# Patient Record
Sex: Male | Born: 1950 | Race: White | Hispanic: No | Smoking: Former smoker
Health system: Southern US, Community
[De-identification: ages and names within clinical notes are randomized; demographics above are authoritative.]

## PROBLEM LIST (undated history)

## (undated) DIAGNOSIS — F32A Depression, unspecified: Secondary | ICD-10-CM

## (undated) DIAGNOSIS — K922 Gastrointestinal hemorrhage, unspecified: Secondary | ICD-10-CM

## (undated) DIAGNOSIS — K219 Gastro-esophageal reflux disease without esophagitis: Secondary | ICD-10-CM

## (undated) DIAGNOSIS — K449 Diaphragmatic hernia without obstruction or gangrene: Secondary | ICD-10-CM

## (undated) DIAGNOSIS — F419 Anxiety disorder, unspecified: Secondary | ICD-10-CM

## (undated) DIAGNOSIS — F1910 Other psychoactive substance abuse, uncomplicated: Secondary | ICD-10-CM

## (undated) DIAGNOSIS — I8500 Esophageal varices without bleeding: Secondary | ICD-10-CM

## (undated) DIAGNOSIS — R519 Headache, unspecified: Secondary | ICD-10-CM

## (undated) DIAGNOSIS — K769 Liver disease, unspecified: Secondary | ICD-10-CM

## (undated) DIAGNOSIS — R51 Headache: Secondary | ICD-10-CM

## (undated) DIAGNOSIS — N189 Chronic kidney disease, unspecified: Secondary | ICD-10-CM

## (undated) DIAGNOSIS — I639 Cerebral infarction, unspecified: Secondary | ICD-10-CM

## (undated) DIAGNOSIS — G8929 Other chronic pain: Secondary | ICD-10-CM

## (undated) DIAGNOSIS — E785 Hyperlipidemia, unspecified: Secondary | ICD-10-CM

## (undated) DIAGNOSIS — F329 Major depressive disorder, single episode, unspecified: Secondary | ICD-10-CM

## (undated) DIAGNOSIS — Z87442 Personal history of urinary calculi: Secondary | ICD-10-CM

## (undated) DIAGNOSIS — M1990 Unspecified osteoarthritis, unspecified site: Secondary | ICD-10-CM

## (undated) DIAGNOSIS — I10 Essential (primary) hypertension: Secondary | ICD-10-CM

## (undated) DIAGNOSIS — L409 Psoriasis, unspecified: Secondary | ICD-10-CM

## (undated) DIAGNOSIS — T7840XA Allergy, unspecified, initial encounter: Secondary | ICD-10-CM

## (undated) DIAGNOSIS — D649 Anemia, unspecified: Secondary | ICD-10-CM

## (undated) DIAGNOSIS — J45909 Unspecified asthma, uncomplicated: Secondary | ICD-10-CM

## (undated) DIAGNOSIS — K635 Polyp of colon: Secondary | ICD-10-CM

## (undated) HISTORY — DX: Other chronic pain: G89.29

## (undated) HISTORY — DX: Headache: R51

## (undated) HISTORY — DX: Gastro-esophageal reflux disease without esophagitis: K21.9

## (undated) HISTORY — DX: Essential (primary) hypertension: I10

## (undated) HISTORY — DX: Unspecified asthma, uncomplicated: J45.909

## (undated) HISTORY — DX: Polyp of colon: K63.5

## (undated) HISTORY — DX: Major depressive disorder, single episode, unspecified: F32.9

## (undated) HISTORY — DX: Esophageal varices without bleeding: I85.00

## (undated) HISTORY — DX: Unspecified osteoarthritis, unspecified site: M19.90

## (undated) HISTORY — DX: Anemia, unspecified: D64.9

## (undated) HISTORY — DX: Diaphragmatic hernia without obstruction or gangrene: K44.9

## (undated) HISTORY — PX: TONSILLECTOMY AND ADENOIDECTOMY: SHX28

## (undated) HISTORY — DX: Depression, unspecified: F32.A

## (undated) HISTORY — DX: Anxiety disorder, unspecified: F41.9

## (undated) HISTORY — PX: VASECTOMY: SHX75

## (undated) HISTORY — PX: COLONOSCOPY: SHX174

## (undated) HISTORY — DX: Headache, unspecified: R51.9

## (undated) HISTORY — DX: Cerebral infarction, unspecified: I63.9

## (undated) HISTORY — DX: Gastrointestinal hemorrhage, unspecified: K92.2

## (undated) HISTORY — DX: Hyperlipidemia, unspecified: E78.5

## (undated) HISTORY — DX: Liver disease, unspecified: K76.9

## (undated) HISTORY — DX: Other psychoactive substance abuse, uncomplicated: F19.10

## (undated) HISTORY — DX: Allergy, unspecified, initial encounter: T78.40XA

---
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
## 1998-08-02 ENCOUNTER — Encounter (INDEPENDENT_AMBULATORY_CARE_PROVIDER_SITE_OTHER): Payer: Self-pay | Admitting: Internal Medicine

## 1998-08-02 LAB — CONVERTED CEMR LAB
RBC count: 4.76 10*6/uL
WBC, blood: 4.7 10*3/uL

## 1999-07-28 ENCOUNTER — Encounter (INDEPENDENT_AMBULATORY_CARE_PROVIDER_SITE_OTHER): Payer: Self-pay | Admitting: Internal Medicine

## 1999-07-28 LAB — CONVERTED CEMR LAB: PSA: 1.4 ng/mL

## 1999-08-14 ENCOUNTER — Encounter (INDEPENDENT_AMBULATORY_CARE_PROVIDER_SITE_OTHER): Payer: Self-pay | Admitting: Internal Medicine

## 1999-08-15 ENCOUNTER — Encounter (INDEPENDENT_AMBULATORY_CARE_PROVIDER_SITE_OTHER): Payer: Self-pay | Admitting: Internal Medicine

## 1999-08-15 LAB — CONVERTED CEMR LAB: RBC count: 4.59 10*6/uL

## 2000-11-27 ENCOUNTER — Encounter (INDEPENDENT_AMBULATORY_CARE_PROVIDER_SITE_OTHER): Payer: Self-pay | Admitting: Internal Medicine

## 2000-12-15 ENCOUNTER — Encounter (INDEPENDENT_AMBULATORY_CARE_PROVIDER_SITE_OTHER): Payer: Self-pay | Admitting: Internal Medicine

## 2000-12-15 LAB — CONVERTED CEMR LAB
PSA: 2.5 ng/mL
TSH: 0.91 microintl units/mL

## 2002-06-28 ENCOUNTER — Encounter (INDEPENDENT_AMBULATORY_CARE_PROVIDER_SITE_OTHER): Payer: Self-pay | Admitting: Internal Medicine

## 2002-06-28 DIAGNOSIS — E785 Hyperlipidemia, unspecified: Secondary | ICD-10-CM

## 2002-06-28 HISTORY — DX: Hyperlipidemia, unspecified: E78.5

## 2002-07-20 ENCOUNTER — Encounter (INDEPENDENT_AMBULATORY_CARE_PROVIDER_SITE_OTHER): Payer: Self-pay | Admitting: Internal Medicine

## 2002-07-20 LAB — CONVERTED CEMR LAB: TSH: 1.8666 microintl units/mL

## 2002-07-21 ENCOUNTER — Encounter (INDEPENDENT_AMBULATORY_CARE_PROVIDER_SITE_OTHER): Payer: Self-pay | Admitting: Internal Medicine

## 2002-07-21 LAB — CONVERTED CEMR LAB: PSA: 1.6 ng/mL

## 2003-01-28 HISTORY — PX: CARPAL TUNNEL RELEASE: SHX101

## 2003-10-12 ENCOUNTER — Encounter (INDEPENDENT_AMBULATORY_CARE_PROVIDER_SITE_OTHER): Payer: Self-pay | Admitting: Internal Medicine

## 2003-10-12 LAB — CONVERTED CEMR LAB: TSH: 2 microintl units/mL

## 2004-02-28 ENCOUNTER — Encounter (INDEPENDENT_AMBULATORY_CARE_PROVIDER_SITE_OTHER): Payer: Self-pay | Admitting: Internal Medicine

## 2004-02-28 ENCOUNTER — Ambulatory Visit: Payer: Self-pay | Admitting: Family Medicine

## 2004-02-28 LAB — CONVERTED CEMR LAB
PSA: 0.64 ng/mL
PSA: 0.64 ng/mL

## 2004-03-13 ENCOUNTER — Ambulatory Visit: Payer: Self-pay | Admitting: Family Medicine

## 2004-03-19 ENCOUNTER — Encounter (INDEPENDENT_AMBULATORY_CARE_PROVIDER_SITE_OTHER): Payer: Self-pay | Admitting: Internal Medicine

## 2005-03-26 ENCOUNTER — Encounter (INDEPENDENT_AMBULATORY_CARE_PROVIDER_SITE_OTHER): Payer: Self-pay | Admitting: Internal Medicine

## 2005-03-26 ENCOUNTER — Ambulatory Visit: Payer: Self-pay | Admitting: Family Medicine

## 2005-03-26 LAB — CONVERTED CEMR LAB: Blood Glucose, Fasting: 91 mg/dL

## 2005-04-17 ENCOUNTER — Ambulatory Visit: Payer: Self-pay | Admitting: Family Medicine

## 2005-04-18 LAB — FECAL OCCULT BLOOD, GUAIAC: Fecal Occult Blood: NEGATIVE

## 2005-04-24 ENCOUNTER — Ambulatory Visit: Payer: Self-pay | Admitting: Family Medicine

## 2006-04-28 ENCOUNTER — Encounter (INDEPENDENT_AMBULATORY_CARE_PROVIDER_SITE_OTHER): Payer: Self-pay | Admitting: Internal Medicine

## 2006-04-28 LAB — CONVERTED CEMR LAB: PSA: 0.7 ng/mL

## 2006-05-20 ENCOUNTER — Ambulatory Visit: Payer: Self-pay | Admitting: Family Medicine

## 2006-05-20 ENCOUNTER — Encounter (INDEPENDENT_AMBULATORY_CARE_PROVIDER_SITE_OTHER): Payer: Self-pay | Admitting: Internal Medicine

## 2006-06-04 ENCOUNTER — Ambulatory Visit: Payer: Self-pay | Admitting: Internal Medicine

## 2006-06-04 DIAGNOSIS — R945 Abnormal results of liver function studies: Secondary | ICD-10-CM | POA: Insufficient documentation

## 2006-08-05 ENCOUNTER — Ambulatory Visit: Payer: Self-pay | Admitting: Gastroenterology

## 2006-08-19 ENCOUNTER — Encounter (INDEPENDENT_AMBULATORY_CARE_PROVIDER_SITE_OTHER): Payer: Self-pay | Admitting: Internal Medicine

## 2006-08-19 ENCOUNTER — Encounter: Payer: Self-pay | Admitting: Family Medicine

## 2006-08-19 ENCOUNTER — Encounter: Payer: Self-pay | Admitting: Gastroenterology

## 2006-08-19 ENCOUNTER — Ambulatory Visit: Payer: Self-pay | Admitting: Gastroenterology

## 2006-08-19 DIAGNOSIS — K635 Polyp of colon: Secondary | ICD-10-CM

## 2006-08-19 HISTORY — DX: Polyp of colon: K63.5

## 2006-08-26 ENCOUNTER — Encounter (INDEPENDENT_AMBULATORY_CARE_PROVIDER_SITE_OTHER): Payer: Self-pay | Admitting: Internal Medicine

## 2006-10-21 ENCOUNTER — Ambulatory Visit: Payer: Self-pay | Admitting: Family Medicine

## 2006-10-21 DIAGNOSIS — M25519 Pain in unspecified shoulder: Secondary | ICD-10-CM | POA: Insufficient documentation

## 2006-10-23 ENCOUNTER — Encounter: Payer: Self-pay | Admitting: Internal Medicine

## 2006-10-23 DIAGNOSIS — F528 Other sexual dysfunction not due to a substance or known physiological condition: Secondary | ICD-10-CM | POA: Insufficient documentation

## 2006-10-23 DIAGNOSIS — D126 Benign neoplasm of colon, unspecified: Secondary | ICD-10-CM | POA: Insufficient documentation

## 2006-10-23 DIAGNOSIS — Z8669 Personal history of other diseases of the nervous system and sense organs: Secondary | ICD-10-CM | POA: Insufficient documentation

## 2006-10-23 DIAGNOSIS — Z87898 Personal history of other specified conditions: Secondary | ICD-10-CM | POA: Insufficient documentation

## 2006-10-23 DIAGNOSIS — M129 Arthropathy, unspecified: Secondary | ICD-10-CM | POA: Insufficient documentation

## 2006-10-23 DIAGNOSIS — E785 Hyperlipidemia, unspecified: Secondary | ICD-10-CM | POA: Insufficient documentation

## 2006-10-23 DIAGNOSIS — K279 Peptic ulcer, site unspecified, unspecified as acute or chronic, without hemorrhage or perforation: Secondary | ICD-10-CM | POA: Insufficient documentation

## 2007-02-10 ENCOUNTER — Ambulatory Visit: Payer: Self-pay | Admitting: Family Medicine

## 2007-02-10 ENCOUNTER — Encounter (INDEPENDENT_AMBULATORY_CARE_PROVIDER_SITE_OTHER): Payer: Self-pay | Admitting: Internal Medicine

## 2007-03-15 ENCOUNTER — Telehealth (INDEPENDENT_AMBULATORY_CARE_PROVIDER_SITE_OTHER): Payer: Self-pay | Admitting: Internal Medicine

## 2007-07-12 ENCOUNTER — Telehealth (INDEPENDENT_AMBULATORY_CARE_PROVIDER_SITE_OTHER): Payer: Self-pay | Admitting: Internal Medicine

## 2007-09-01 ENCOUNTER — Encounter (INDEPENDENT_AMBULATORY_CARE_PROVIDER_SITE_OTHER): Payer: Self-pay | Admitting: Internal Medicine

## 2007-09-01 ENCOUNTER — Ambulatory Visit: Payer: Self-pay | Admitting: Family Medicine

## 2007-09-01 DIAGNOSIS — R0989 Other specified symptoms and signs involving the circulatory and respiratory systems: Secondary | ICD-10-CM | POA: Insufficient documentation

## 2007-09-01 DIAGNOSIS — I10 Essential (primary) hypertension: Secondary | ICD-10-CM | POA: Insufficient documentation

## 2007-09-01 DIAGNOSIS — R0609 Other forms of dyspnea: Secondary | ICD-10-CM

## 2007-11-08 ENCOUNTER — Telehealth (INDEPENDENT_AMBULATORY_CARE_PROVIDER_SITE_OTHER): Payer: Self-pay | Admitting: Internal Medicine

## 2007-11-10 ENCOUNTER — Telehealth (INDEPENDENT_AMBULATORY_CARE_PROVIDER_SITE_OTHER): Payer: Self-pay | Admitting: Internal Medicine

## 2007-12-16 ENCOUNTER — Encounter (INDEPENDENT_AMBULATORY_CARE_PROVIDER_SITE_OTHER): Payer: Self-pay | Admitting: Internal Medicine

## 2008-01-25 ENCOUNTER — Ambulatory Visit: Payer: Self-pay | Admitting: Family Medicine

## 2008-04-18 ENCOUNTER — Ambulatory Visit: Payer: Self-pay | Admitting: Family Medicine

## 2008-11-28 ENCOUNTER — Ambulatory Visit: Payer: Self-pay | Admitting: Family Medicine

## 2008-11-28 DIAGNOSIS — S335XXA Sprain of ligaments of lumbar spine, initial encounter: Secondary | ICD-10-CM | POA: Insufficient documentation

## 2008-11-29 ENCOUNTER — Encounter (INDEPENDENT_AMBULATORY_CARE_PROVIDER_SITE_OTHER): Payer: Self-pay | Admitting: Internal Medicine

## 2008-11-29 DIAGNOSIS — IMO0002 Reserved for concepts with insufficient information to code with codable children: Secondary | ICD-10-CM | POA: Insufficient documentation

## 2008-11-30 ENCOUNTER — Encounter: Payer: Self-pay | Admitting: Family Medicine

## 2008-12-05 ENCOUNTER — Encounter (INDEPENDENT_AMBULATORY_CARE_PROVIDER_SITE_OTHER): Payer: Self-pay | Admitting: Internal Medicine

## 2008-12-06 ENCOUNTER — Encounter (INDEPENDENT_AMBULATORY_CARE_PROVIDER_SITE_OTHER): Payer: Self-pay | Admitting: Internal Medicine

## 2008-12-07 ENCOUNTER — Telehealth (INDEPENDENT_AMBULATORY_CARE_PROVIDER_SITE_OTHER): Payer: Self-pay | Admitting: Internal Medicine

## 2008-12-14 ENCOUNTER — Telehealth (INDEPENDENT_AMBULATORY_CARE_PROVIDER_SITE_OTHER): Payer: Self-pay | Admitting: Internal Medicine

## 2008-12-14 ENCOUNTER — Encounter (INDEPENDENT_AMBULATORY_CARE_PROVIDER_SITE_OTHER): Payer: Self-pay | Admitting: Internal Medicine

## 2008-12-25 ENCOUNTER — Telehealth (INDEPENDENT_AMBULATORY_CARE_PROVIDER_SITE_OTHER): Payer: Self-pay | Admitting: Internal Medicine

## 2008-12-27 ENCOUNTER — Encounter: Payer: Self-pay | Admitting: Family Medicine

## 2009-01-31 ENCOUNTER — Telehealth (INDEPENDENT_AMBULATORY_CARE_PROVIDER_SITE_OTHER): Payer: Self-pay | Admitting: Internal Medicine

## 2009-02-07 ENCOUNTER — Telehealth: Payer: Self-pay | Admitting: Family Medicine

## 2009-05-14 ENCOUNTER — Ambulatory Visit: Payer: Self-pay | Admitting: Family Medicine

## 2009-05-15 ENCOUNTER — Ambulatory Visit: Payer: Self-pay | Admitting: Family Medicine

## 2009-05-15 DIAGNOSIS — R252 Cramp and spasm: Secondary | ICD-10-CM | POA: Insufficient documentation

## 2009-05-15 DIAGNOSIS — R002 Palpitations: Secondary | ICD-10-CM | POA: Insufficient documentation

## 2009-05-23 ENCOUNTER — Ambulatory Visit: Payer: Self-pay | Admitting: Family Medicine

## 2009-05-23 DIAGNOSIS — R748 Abnormal levels of other serum enzymes: Secondary | ICD-10-CM | POA: Insufficient documentation

## 2009-11-15 ENCOUNTER — Ambulatory Visit: Payer: Self-pay | Admitting: Family Medicine

## 2009-11-15 ENCOUNTER — Encounter: Payer: Self-pay | Admitting: Family Medicine

## 2009-11-15 DIAGNOSIS — K921 Melena: Secondary | ICD-10-CM | POA: Insufficient documentation

## 2009-12-04 ENCOUNTER — Telehealth: Payer: Self-pay | Admitting: Family Medicine

## 2010-02-26 NOTE — Assessment & Plan Note (Signed)
Summary: blood in stool/alc   Vital Signs:  Patient profile:   60 year old male Height:      65 inches Weight:      192.50 pounds BMI:     32.15 Temp:     98.7 degrees F oral Pulse rate:   84 / minute Pulse rhythm:   regular BP sitting:   110 / 56  (left arm) Cuff size:   large  Vitals Entered By: Delilah Shan CMA Duncan Dull) (November 15, 2009 3:40 PM) CC: Blood in stool   History of Present Illness: H/o regular BMs.  Goes to [facility] yesterday and noticed dark blood in stool.  None before that.  Later in the day, patient felt urgency and had bloody diarrhea (still dark).   Repeat colonoscopy last done 2008.    25 years ago patient had hemorrhoids and ulcers.   On meloxicam and PPI.  No otc BC powders/goody's.  Not on ASA.  Not dizzy on standing.  No hearburn, no abdominal pain.  Intentionally losing weight with diet.    3 normal BMs in meantime. No other c/o.   Allergies: 1)  ! Sulfa 2)  ! 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  Otherwise negative.    Physical Exam  General:  NAD abdominal exam beign, s, nt, nd, normal bs rectal faintly heme positive.  no gross blood.  Old ext hemorroid noted, small int hemorroids on exam w/o active  bleeding.    Impression & Recommendations:  Problem # 1:  BLOOD IN STOOL (ICD-578.1)  Prev olonoscopy with tubular adenomas.  Int hemorroids on exam today.  If cbc wnl, and no more symptoms, then will not refer to to GI.  He was faintly heme pos today as expected. See notes on labs.   Orders: Specimen Handling (16109) TLB-CBC Platelet - w/Differential (85025-CBCD)  Complete Medication List: 1)  Lisinopril 20 Mg Tabs (Lisinopril) .... Take 1 tablet by mouth once a day 2)  Sertraline Hcl 50 Mg Tabs (Sertraline hcl) .... Take 1 tablet by mouth once a day 3)  Meloxicam 7.5 Mg Tabs (Meloxicam) .Marland Kitchen.. 1-2 once daily by mouth as needed 4)  Glucosamine-chondroitin Caps (Glucosamine-chondroit-vit c-mn) .Marland Kitchen.. 1 two times a day 5)  Daily Multiple Vitamins Tabs (Multiple vitamin) .Marland Kitchen.. 1  a day 6)  Cialis 20 Mg Tabs (Tadalafil) .... Use no more often than every 36 hours 7)  Potassium 99 Mg Tabs (Potassium) .... Take 1 tablet by mouth two times a day 8)  Prevacid 30 Mg Cpdr (Lansoprazole) .... Take one each morning with water, eat or drink 30-60 minutes later  Patient Instructions: 1)  I'll look at your  labs and send word about going to the GI clinic.  Let me know if you have any more symptoms.  Take care.    Orders Added: 1)  Est. Patient Level III [14782] 2)  Specimen Handling [99000] 3)  TLB-CBC Platelet - w/Differential [85025-CBCD]    Current Allergies (reviewed today): ! SULFA ! * BEE STINGS

## 2010-02-26 NOTE — Progress Notes (Signed)
Summary: Sertraline HCK 50mg  and Meloxicam 7.5mg  refills  Phone Note Refill Request Call back at [phone] Message from:  [facility] [address] on January 31, 2009 4:37 PM  Refills Requested: Medication #1:  SERTRALINE HCL 50 MG TABS Take 1 tablet by mouth once a day  Medication #2:  MELOXICAM 7.5 MG TABS 1-2 once daily [facility] [address] request refills for Sertraline 50mg  and Meloxicam 7.5mg .Please advise.    Method Requested: Telephone to Pharmacy Initial call taken by: Lewanda Rife LPN,  January 31, 2009 4:38 PM  Follow-up for Phone Call        will refill x 3 only--needs office visit in 03/2009--last seen 03/2008   Billie-Lynn Tyler Deis FNP  January 31, 2009 5:14 PM   Unable to reach pt by phone. Pharmacist said would add note to rx to call office for appt in March 2011.Lewanda Rife LPN  February 01, 2009 11:24 AM     Prescriptions: MELOXICAM 7.5 MG TABS (MELOXICAM) 1-2 once daily  #180 x 0   Entered and Authorized by:   Gildardo Griffes FNP   Signed by:   Gildardo Griffes FNP on 01/31/2009   Method used:   [facility]ronically to        [facility]  [address] [phone][facility] (retail)       [address]       [place], [place]  [address]       Ph: [phone]       Fax: [phone]   RxID:   [id number] SERTRALINE HCL 50 MG TABS (SERTRALINE HCL) Take 1 tablet by mouth once a day  #90 x 0   Entered and Authorized by:   Gildardo Griffes FNP   Signed by:   Gildardo Griffes FNP on 01/31/2009   Method used:   [facility]ronically to        [facility]  [address] [phone][facility] (retail)       [address]       [place], [place]  [address]       Ph: [phone]       Fax: [phone]   RxID:   [id number]

## 2010-02-26 NOTE — Progress Notes (Signed)
Summary: Written rx's for all meds  Phone Note Call from Patient Call back at [phone]   Caller: Patient Call For: Dr. Hetty Ely Summary of Call: Patient called and requested written rx's for his mail order pharmacy for Meloxicam, Cialis, Lisinopril, Sertraline and Prevacid. Patient states that he needs 2 months worth. Call when ready for pickup. DR. Lorenza Chick PATIENT. Initial call taken by: Sydell Axon LPN,  February 07, 2009 2:57 PM  Follow-up for Phone Call        printed in put in nurse in box for pickup for 2 mo of each med Follow-up by: Judith Part MD,  February 07, 2009 5:04 PM  Additional Follow-up for Phone Call Additional follow up Details #1::        [facility] advising pt ok to pick up scripts. Additional Follow-up by: Lowella Petties CMA,  February 08, 2009 8:55 AM    New/Updated Medications: LISINOPRIL 20 MG TABS (LISINOPRIL) Take 1 tablet by mouth once a day MELOXICAM 7.5 MG TABS (MELOXICAM) 1-2 once daily by mouth as needed CIALIS 20 MG TABS (TADALAFIL) use no more often than every 36 hours PREVACID 30 MG CPDR (LANSOPRAZOLE) take one each morning with water, eat or drink 30-60 minutes later Prescriptions: PREVACID 30 MG CPDR (LANSOPRAZOLE) take one each morning with water, eat or drink 30-60 minutes later  #60 x 0   Entered and Authorized by:   Judith Part MD   Signed by:   Judith Part MD on 02/07/2009   Method used:   Print then Give to Patient   RxID:   [id number] CIALIS 20 MG TABS (TADALAFIL) use no more often than every 36 hours  #2 months x 0   Entered and Authorized by:   Judith Part MD   Signed by:   Judith Part MD on 02/07/2009   Method used:   Print then Give to Patient   RxID:   [id number] MELOXICAM 7.5 MG TABS (MELOXICAM) 1-2 once daily by mouth as needed  #120 x 0   Entered and Authorized by:   Judith Part MD   Signed by:   Judith Part MD on 02/07/2009   Method used:   Print then Give to Patient   RxID:    [id number] SERTRALINE HCL 50 MG TABS (SERTRALINE HCL) Take 1 tablet by mouth once a day  #60 x 0   Entered and Authorized by:   Judith Part MD   Signed by:   Judith Part MD on 02/07/2009   Method used:   Print then Give to Patient   RxID:   [id number] LISINOPRIL 20 MG TABS (LISINOPRIL) Take 1 tablet by mouth once a day  #60 x 0   Entered and Authorized by:   Judith Part MD   Signed by:   Judith Part MD on 02/07/2009   Method used:   Print then Give to Patient   RxID:   [id number]

## 2010-02-26 NOTE — Assessment & Plan Note (Signed)
Summary: Follow up per TA Salley Scarlet   Vital Signs:  Patient profile:   60 year old male Height:      65 inches Weight:      192.50 pounds BMI:     32.15 Temp:     98.7 degrees F oral Pulse rate:   76 / minute Pulse rhythm:   regular BP sitting:   116 / 68  (left arm) Cuff size:   large  Vitals Entered By: Delilah Shan CMA Duncan Dull) (May 23, 2009 3:25 PM) CC: Follow up  per TMA   History of Present Illness: 60 yo here for follow up labs.  Hypertriglyceridemia- TG 322, HDL 51, LDL 81.  Fasting glucose 84. Discussed diet- he eats a biscut every morning, coffee with sugar. Likes starchy foods.  Elevated transaminases- AST 77, ALT 75, otherwise normal. Admits to drinking 6 cans of beer a night. No nausea or vomiting. No abdominal pain, no yellowing of skin.  Current Medications (verified): 1)  Lisinopril 20 Mg Tabs (Lisinopril) .... Take 1 Tablet By Mouth Once A Day 2)  Sertraline Hcl 50 Mg Tabs (Sertraline Hcl) .... Take 1 Tablet By Mouth Once A Day 3)  Meloxicam 7.5 Mg Tabs (Meloxicam) .Marland Kitchen.. 1-2 Once Daily By Mouth As Needed 4)  Glucosamine-Chondroitin  Caps (Glucosamine-Chondroit-Vit C-Mn) .Marland Kitchen.. 1 Two Times A Day 5)  Daily Multiple Vitamins  Tabs (Multiple Vitamin) .Marland Kitchen.. 1  A Day 6)  Cialis 20 Mg Tabs (Tadalafil) .... Use No More Often Than Every 36 Hours 7)  Potassium 99 Mg  Tabs (Potassium) .... Take 1 Tablet By Mouth Two Times A Day 8)  Prevacid 30 Mg Cpdr (Lansoprazole) .... Take One Each Morning With Water, Eat or Drink 30-60 Minutes Later 9)  Soma 250 Mg Tabs (Carisoprodol) .... Take 1 Up To 4 Times A Day For Low Back Strain  Allergies: 1)  ! Sulfa 2)  ! * Bee Stings  Review of Systems      See HPI General:  Denies chills, fever, and malaise. GI:  Denies abdominal pain, nausea, vomiting, and yellowish skin color.  Physical Exam  General:  alert, well-developed, well-nourished, and well-hydrated.   Abdomen:  obese, soft, non-tender, normal bowel sounds, no distention,  no masses, no guarding, no abdominal hernia, no inguinal hernia, no hepatomegaly, and no splenomegaly.   Psych:  Cognition and judgment appear intact. Alert and cooperative with normal attention span and concentration. No apparent delusions, illusions, hallucinations   Impression & Recommendations:  Problem # 1:  HYPERTRIGLYCERIDEMIA (ICD-272.1) Assessment New Time spent with patient 25 minutes, more than 50% of this time was spent counseling patient on hypertriglyeridemia and elevated LFTs. He knows that he can make changes in his diet and would like to try this first before starting meds.  This is appropriate given that the rest of his lipid panel was fine.  I did tell him the importance of decreasing his TG.  Problem # 2:  OTHER NONSPECIFIC ABNORMAL SERUM ENZYME LEVELS (ICD-790.5) Likely related to alcohol use.  Counselled on importance of cutting back alcohol as we want to avoid cirrhosis.  Pt understands.  He will cut back and return for LFTs in 3 months.  If increases, will get ultrasound and hepatitis panel.  Complete Medication List: 1)  Lisinopril 20 Mg Tabs (Lisinopril) .... Take 1 tablet by mouth once a day 2)  Sertraline Hcl 50 Mg Tabs (Sertraline hcl) .... Take 1 tablet by mouth once a day 3)  Meloxicam 7.5 Mg Tabs (  Meloxicam) .Marland Kitchen.. 1-2 once daily by mouth as needed 4)  Glucosamine-chondroitin Caps (Glucosamine-chondroit-vit c-mn) .Marland Kitchen.. 1 two times a day 5)  Daily Multiple Vitamins Tabs (Multiple vitamin) .Marland Kitchen.. 1  a day 6)  Cialis 20 Mg Tabs (Tadalafil) .... Use no more often than every 36 hours 7)  Potassium 99 Mg Tabs (Potassium) .... Take 1 tablet by mouth two times a day 8)  Prevacid 30 Mg Cpdr (Lansoprazole) .... Take one each morning with water, eat or drink 30-60 minutes later 9)  Soma 250 Mg Tabs (Carisoprodol) .... Take 1 up to 4 times a day for low back strain  Patient Instructions: 1)  Good to see you. 2)  Come back in 3 months for labs- fasting lipid panel, hepatic  panel (272.1) Prescriptions: PREVACID 30 MG CPDR (LANSOPRAZOLE) take one each morning with water, eat or drink 30-60 minutes later  #90 x 3   Entered and Authorized by:   Ruthe Mannan MD   Signed by:   Ruthe Mannan MD on 05/23/2009   Method used:   Print then Give to Patient   RxID:   [id number] CIALIS 20 MG TABS (TADALAFIL) use no more often than every 36 hours  #6 x 3   Entered and Authorized by:   Ruthe Mannan MD   Signed by:   Ruthe Mannan MD on 05/23/2009   Method used:   Print then Give to Patient   RxID:   [id number]   Current Allergies (reviewed today): ! SULFA ! * BEE STINGS

## 2010-02-26 NOTE — Assessment & Plan Note (Signed)
Summary: 45 MIN CPX/BILLIE'S PT/CLE   Vital Signs:  Patient profile:   60 year old male Height:      65 inches Weight:      189 pounds BMI:     31.56 Temp:     98.6 degrees F oral Pulse rate:   76 / minute Pulse rhythm:   regular BP sitting:   148 / 82  (left arm) Cuff size:   large  Vitals Entered By: Lewanda Rife LPN (May 15, 2009 8:45 AM) CC: complete physical   History of Present Illness: 60 yo pt new to me here for CPX.  HTN- has been well controlled on Lisinopril 20 mg daily.  Prior BPs reviewed, stable.  Has not yet taken his meds this morning.  No CP, SOB, LE edema.  Leg cramps- has been taking Ptoassium to help with leg cramps.  He feels like they are getting worse.  Had BMET and FLP drawn yesterday, we still do not have those results.  Heart palpitations- on and off for several months.  Last for just a few seconds.  Not worsened by exertion.  No associated CP, SOB, sycope or presyncope.  No diaphoresis, nausea or vomiting.    He is under great deal of stress.  Wife is filing for divorce. Zoloft 40 mg daily helping with stress.  No SI or HI. Does have good support system, friends and family.  Well man- UTD on prevention other than PSA.  Current Medications (verified): 1)  Lisinopril 20 Mg Tabs (Lisinopril) .... Take 1 Tablet By Mouth Once A Day 2)  Sertraline Hcl 50 Mg Tabs (Sertraline Hcl) .... Take 1 Tablet By Mouth Once A Day 3)  Meloxicam 7.5 Mg Tabs (Meloxicam) .Marland Kitchen.. 1-2 Once Daily By Mouth As Needed 4)  Glucosamine-Chondroitin  Caps (Glucosamine-Chondroit-Vit C-Mn) .Marland Kitchen.. 1 Two Times A Day 5)  Daily Multiple Vitamins  Tabs (Multiple Vitamin) .Marland Kitchen.. 1  A Day 6)  Cialis 20 Mg Tabs (Tadalafil) .... Use No More Often Than Every 36 Hours 7)  Potassium 99 Mg  Tabs (Potassium) .... Take 1 Tablet By Mouth Two Times A Day 8)  Prevacid 30 Mg Cpdr (Lansoprazole) .... Take One Each Morning With Water, Eat or Drink 30-60 Minutes Later 9)  Soma 250 Mg Tabs (Carisoprodol) ....  Take 1 Up To 4 Times A Day For Low Back Strain  Allergies: 1)  ! Sulfa 2)  ! * Bee Stings  Past History:  Past Medical History: Last updated: 10/23/2006 colon polyps, diverticulum on colonoscopy 08/19/06 Hyperlipidemia:06/2002  Past Surgical History: Last updated: 10/23/2006 EGD -- ULCER--:(1989) COLONOSCOPY - INTERNAL HEMORRHOIDS--:(1989)/---/POLYPS :(07/2006) RIGHT CARPAL TUNNEL AND TRIGGER FINGER--:(2005) HALTER MONITOR//  SINUS BRADYCARDIA/ SINUS ARRTHRYMIA:--(09/1998)  Family History: Last updated: 09/01/2007 Father: 85 YOA ALIVE AND WELL, 8/09--melanoma face, hx of melanoma 07/03/05 Mother: DECEASED 95 YOA// DIED FROM MI// CVA(SEVERAL), HAD MI AT 38 YOA : AND DM GRANDPARENTS: PGF //OLD AGE 87+ PGM: DECEASED FROM GANGRENE MALE ORGANS PAST CHILDBIRTH MGF: YOUNG ?ETOH ABUSE Siblings: 1 BROTHER FULL: LIVING AND WELL 1 HALF BROTHER : LIVING AND WELL SISTERS: 2 FULL SISTERS ( 1--?DIVERTICULOSIS)                                               2: LIVING AND WELL CV:: PUNCLE-- MI DECEASED IN 60"S; MGM - MI DIED WITH #2 HBP: DM : NEGATIVE  GOUT/ARTHRITIS: PROSTATE CANCER: NEGATIVE BREAST CANCER: P AUNT COLON CANCER: NEGATIVE DEPRESSION: + SISTER ETOH/DRUG ABUSE: NEGATIVE  OTHER :NEGATIVE  Social History: Last updated: 01/25/2008 Marital Status: Married// 2ND WIFE Children: 2 CHILDREN 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 Father: 20 YOA ALIVE AND WELL, 8/09--melanoma face, hx of melanoma  2005/06/14 Mother: DECEASED 79 YOA// DIED FROM MI// CVA(SEVERAL), HAD MI AT 37 YOA : AND DM GRANDPARENTS: PGF //OLD AGE 4+ PGM: DECEASED FROM GANGRENE MALE ORGANS PAST CHILDBIRTH MGF: YOUNG ?ETOH ABUSE Siblings: 1 BROTHER FULL: LIVING AND WELL 1 HALF BROTHER : LIVING AND WELL SISTERS: 2 FULL SISTERS ( 1--?DIVERTICULOSIS)                                               2: LIVING AND WELL CV:: PUNCLE-- MI DECEASED IN 60"S; MGM - MI DIED WITH #2 HBP: DM : NEGATIVE GOUT/ARTHRITIS: PROSTATE CANCER: NEGATIVE BREAST CANCER: P AUNT COLON CANCER: NEGATIVE DEPRESSION: + SISTER ETOH/DRUG ABUSE: NEGATIVE  OTHER :NEGATIVE  Social History: Reviewed history from 01/25/2008 and no changes required. 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 Eyes:  Denies blurring. ENT:  Denies decreased hearing and difficulty swallowing. CV:  Complains of palpitations; denies chest pain or discomfort, difficulty breathing at night, fainting, fatigue, leg cramps with exertion, lightheadness, and near fainting. Resp:  Denies shortness of breath. GI:  Denies abdominal pain and bloody stools. GU:  Denies erectile dysfunction, incontinence, nocturia, urinary frequency, and urinary hesitancy. MS:  Denies joint pain, joint redness, joint swelling, loss of strength, low back pain, mid back pain, muscle aches, and muscle weakness. Derm:  Denies rash. Neuro:  Denies headaches. Psych:  Denies anxiety, depression, sense of great danger, suicidal thoughts/plans, thoughts of violence, unusual visions or sounds, and thoughts /plans of harming others. Endo:  Denies  cold intolerance and heat intolerance. Heme:  Denies abnormal bruising. Allergy:  Denies persistent infections and seasonal allergies.  Physical Exam  General:  alert, well-developed, well-nourished, and well-hydrated.   Head:  normocephalic, atraumatic, and no abnormalities observed.   Eyes:  Wears glasses Conjunctiva clear bilaterally.  Ears:  External ear exam shows no significant lesions or deformities.  Otoscopic examination reveals clear  canals, tympanic membranes are intact bilaterally without bulging, retraction, inflammation or discharge. Hearing is grossly normal bilaterally. Nose:  External nasal examination shows no deformity or inflammation. Nasal mucosa are pink and moist without lesions or exudates. Mouth:  Oral mucosa and oropharynx without lesions or exudates.  Teeth in good repair. Neck:  No deformities, masses, or tenderness noted. Lungs:  Normal respiratory effort, chest expands symmetrically. Lungs are clear to auscultation, no crackles or wheezes. Heart:  Normal rate and regular rhythm. S1 and S2 normal without gallop, murmur, click, rub or other extra sounds. Abdomen:  obese, soft, non-tender, normal bowel sounds, no distention, no masses, no guarding, no abdominal hernia, no inguinal hernia, no hepatomegaly, and no splenomegaly.   Extremities:  no edema Neurologic:  alert & oriented X3.   Skin:  seborrheic keratosis.   Psych:  Cognition and judgment appear intact. Alert and cooperative with normal attention span and concentration. No apparent delusions, illusions, hallucinations   Impression & Recommendations:  Problem # 1:  WELL ADULT EXAM (ICD-V70.0) Reviewed preventive care protocols, scheduled due services, and updated immunizations Discussed nutrition, exercise, diet, and healthy lifestyle.  Awaiting results from FLP, hepatic panel, BMET. PSA today.  Problem # 2:  PALPITATIONS (ICD-785.1) Assessment: New Appears benign in nature, EKG showed NSR. Will  check Mag today as well given associated symptoms of leg cramping (on longer term PPI). Orders: EKG w/ Interpretation (93000)  Complete Medication List: 1)  Lisinopril 20 Mg Tabs (Lisinopril) .... Take 1 tablet by mouth once a day 2)  Sertraline Hcl 50 Mg Tabs (Sertraline hcl) .... Take 1 tablet by mouth once a day 3)  Meloxicam 7.5 Mg Tabs (Meloxicam) .Marland Kitchen.. 1-2 once daily by mouth as needed 4)  Glucosamine-chondroitin Caps (Glucosamine-chondroit-vit c-mn) .Marland Kitchen.. 1 two times a day 5)  Daily Multiple Vitamins Tabs (Multiple vitamin) .Marland Kitchen.. 1  a day 6)  Cialis 20 Mg Tabs (Tadalafil) .... Use no more often than every 36 hours 7)  Potassium 99 Mg Tabs (Potassium) .... Take 1 tablet by mouth two times a day 8)  Prevacid 30 Mg Cpdr (Lansoprazole) .... Take one each morning with water, eat or drink 30-60 minutes later 9)  Soma 250 Mg Tabs (Carisoprodol) .... Take 1 up to 4 times a day for low back strain  Other Orders: Venipuncture (16109) TLB-Magnesium (Mg) (83735-MG) TLB-PSA (Prostate Specific Antigen) (84153-PSA) Prescriptions: POTASSIUM 99 MG  TABS (POTASSIUM) Take 1 tablet by mouth two times a day  #120 x 3   Entered and Authorized by:   Ruthe Mannan MD   Signed by:   Ruthe Mannan MD on 05/15/2009   Method used:   Print then Give to Patient   RxID:   [id number] MELOXICAM 7.5 MG TABS (MELOXICAM) 1-2 once daily by mouth as needed  #120 x 3   Entered and Authorized by:   Ruthe Mannan MD   Signed by:   Ruthe Mannan MD on 05/15/2009   Method used:   Print then Give to Patient   RxID:   [id number] SERTRALINE HCL 50 MG TABS (SERTRALINE HCL) Take 1 tablet by mouth once a day  #90 x 3   Entered and Authorized by:   Ruthe Mannan MD   Signed by:   Ruthe Mannan MD on 05/15/2009   Method used:   Print then Give to Patient   RxID:   [id number] LISINOPRIL 20 MG TABS (LISINOPRIL) Take 1 tablet by mouth once a day  #90 x 3   Entered and Authorized  by:   Ruthe Mannan MD   Signed by:   Ruthe Mannan MD  on 05/15/2009   Method used:   Print then Give to Patient   RxID:   [id number]   Current Allergies (reviewed today): ! SULFA ! * BEE STINGS  Flex Sig Next Due:  Not Indicated Last Hemoccult Result: Negative (04/18/2005 1:04:26 PM) Hemoccult Next Due:  Not Indicated  Appended Document: 45 MIN CPX/BILLIE'S PT/CLE

## 2010-02-26 NOTE — Progress Notes (Signed)
Summary: Rx Lisinopril, Sertraline, Meloxicam, Prevacid & Cialis  Phone Note Refill Request Message from:  Fax from Pharmacy  Refills Requested: Medication #1:  LISINOPRIL 20 MG TABS Take 1 tablet by mouth once a day  Medication #2:  SERTRALINE HCL 50 MG TABS Take 1 tablet by mouth once a day  Medication #3:  MELOXICAM 7.5 MG TABS 1-2 once daily by mouth as needed  Medication #4:  PREVACID 30 MG CPDR take one each morning with water  Also cialis.  F[facility]ed [facility] are on your desk.  I dont know where these originated but they are saying they are missing your signature.  Initial call taken by: Lowella Petties CMA, AAMA,  December 04, 2009 3:25 PM  Follow-up for Phone Call        on my desk. Ruthe Mannan MD  December 05, 2009 7:42 AM  Rx's was changed so quantity would reflect 90 day supply, faxed hard copy of Rx's to [facility] [phone].  Follow-up by: Linde Gillis CMA Duncan Dull),  December 06, 2009 10:24 AM    Prescriptions: CIALIS 20 MG TABS (TADALAFIL) use no more often than every 36 hours  #90 x 3   Entered by:   Linde Gillis CMA (AAMA)   Authorized by:   Ruthe Mannan MD   Signed by:   Linde Gillis CMA (AAMA) on 12/06/2009   Method used:   Historical   RxID:   [id number] PREVACID 30 MG CPDR (LANSOPRAZOLE) take one each morning with water, eat or drink 30-60 minutes later  #90 x 3   Entered by:   Linde Gillis CMA (AAMA)   Authorized by:   Ruthe Mannan MD   Signed by:   Linde Gillis CMA (AAMA) on 12/06/2009   Method used:   Historical   RxID:   [id number] MELOXICAM 7.5 MG TABS (MELOXICAM) 1-2 once daily by mouth as needed  #360 x 3   Entered by:   Linde Gillis CMA (AAMA)   Authorized by:   Ruthe Mannan MD   Signed by:   Linde Gillis CMA (AAMA) on 12/06/2009   Method used:   Historical   RxID:   [id number] SERTRALINE HCL 50 MG TABS (SERTRALINE HCL) Take 1 tablet by mouth once a day  #90 x 3   Entered by:   Linde Gillis CMA (AAMA)  Authorized by:   Ruthe Mannan MD   Signed by:   Linde Gillis CMA (AAMA) on 12/06/2009   Method used:   Historical   RxID:   [id number] LISINOPRIL 20 MG TABS (LISINOPRIL) Take 1 tablet by mouth once a day  #90 x 3   Entered by:   Linde Gillis CMA (AAMA)   Authorized by:   Ruthe Mannan MD   Signed by:   Linde Gillis CMA (AAMA) on 12/06/2009   Method used:   Historical   RxID:   [id number]

## 2010-05-08 ENCOUNTER — Encounter: Payer: Self-pay | Admitting: Family Medicine

## 2010-05-09 ENCOUNTER — Ambulatory Visit (INDEPENDENT_AMBULATORY_CARE_PROVIDER_SITE_OTHER): Payer: Self-pay | Admitting: Family Medicine

## 2010-05-09 ENCOUNTER — Encounter: Payer: Self-pay | Admitting: Family Medicine

## 2010-05-09 VITALS — BP 140/80 | HR 58 | Temp 98.3°F | Ht 65.0 in | Wt 189.4 lb

## 2010-05-09 DIAGNOSIS — M5136 Other intervertebral disc degeneration, lumbar region: Secondary | ICD-10-CM

## 2010-05-09 DIAGNOSIS — R61 Generalized hyperhidrosis: Secondary | ICD-10-CM | POA: Insufficient documentation

## 2010-05-09 DIAGNOSIS — M5137 Other intervertebral disc degeneration, lumbosacral region: Secondary | ICD-10-CM

## 2010-05-09 NOTE — Progress Notes (Signed)
60 yo with worsening back pain and night sweats.  In November 2010, had an MRI due to severe lower back pain MRI reviewed, showed multilevel lumbar spondylosis and degenerative disc and facet disease. He has never had any LE radiulopathy, urinary retention/incontinence, saddle anesthesia or LE weakness. Over past several months, low back pain has worsened. At times, he is so stiff. On his feet all day for work.  Has arthritis in his hands as well.  Notices more frequent night sweats as well, as many as 3 per week for the last 6 months. No weight loss.  Has lost 3 pounds since last year but he has been trying to eat a little better.  No fevers.  No blood in stool. Last colonoscopy in 2008.   The PMH, PSH, Social History, Family History, Medications, and allergies have been reviewed in [facility], and have been updated if relevant.   Review of Systems       See HPI General:  Denies chills, fatigue, fever, loss of appetite, and malaise. Eyes:  Denies blurring. ENT:  Denies decreased hearing and difficulty swallowing. CV:   denies chest pain or discomfort, difficulty breathing at night, fainting, fatigue, leg cramps with exertion, lightheadness, and near fainting. Resp:  Denies shortness of breath. GI:  Denies abdominal pain and bloody stools. GU:  Denies erectile dysfunction, incontinence, nocturia, urinary frequency, and urinary hesitancy. Derm:  Denies rash. Neuro:  Denies headaches. Psych:  Denies anxiety, depression, sense of great danger, suicidal thoughts/plans, thoughts of violence, unusual visions or sounds, and thoughts /plans of harming others. Endo:  Denies cold intolerance and heat intolerance. Heme:  Denies abnormal bruising. Allergy:  Denies persistent infections and seasonal allergies.  Physical Exam BP 140/80  Pulse 58  Temp(Src) 98.3 F (36.8 C) (Oral)  Ht 5\' 5"  (1.651 m)  Wt 189 lb 6.4 oz (85.911 kg)  BMI 31.52 kg/m2 General:  alert, well-developed,  well-nourished, and well-hydrated.   Head:  normocephalic, atraumatic, and no abnormalities observed.   Eyes:  Wears glasses Conjunctiva clear bilaterally.  Ears:  External ear exam shows no significant lesions or deformities.  Otoscopic examination reveals clear canals, tympanic membranes are intact bilaterally without bulging, retraction, inflammation or discharge. Hearing is grossly normal bilaterally. Nose:  External nasal examination shows no deformity or inflammation. Nasal mucosa are pink and moist without lesions or exudates. Mouth:  Oral mucosa and oropharynx without lesions or exudates.  Teeth in good repair. Neck:  No deformities, masses, or tenderness noted. Lungs:  Normal respiratory effort, chest expands symmetrically. Lungs are clear to auscultation, no crackles or wheezes. Heart:  Normal rate and regular rhythm. S1 and S2 normal without gallop, murmur, click, rub or other extra sounds. Abdomen:  obese, soft, non-tender, normal bowel sounds, no distention, no masses, no guarding, no abdominal hernia, no inguinal hernia, no hepatomegaly, and no splenomegaly.   Extremities:  no edema MSK:   Back w/o midline pain. Paraspinal muscle tenderness: SLR: EXT w/o weakness, DTR wnl, sensation wnl

## 2010-05-09 NOTE — Assessment & Plan Note (Signed)
Deteriorated. Will further to ortho given previous MRI findings.

## 2010-05-09 NOTE — Patient Instructions (Signed)
Please stop by to see Jeff Ward on your way out. 

## 2010-05-09 NOTE — Assessment & Plan Note (Signed)
New.  Unclear etiology. Will check labs today to rule out anemia, certain malignancies. Orders Placed This Encounter  Procedures  . Vitamin B12  . CBC with Differential  . PSA  . Ambulatory referral to Orthopedic Surgery

## 2010-05-20 ENCOUNTER — Encounter: Payer: Self-pay | Admitting: Family Medicine

## 2010-05-23 ENCOUNTER — Encounter: Payer: Self-pay | Admitting: Family Medicine

## 2010-05-23 ENCOUNTER — Encounter: Payer: Self-pay | Admitting: *Deleted

## 2010-05-23 ENCOUNTER — Ambulatory Visit (INDEPENDENT_AMBULATORY_CARE_PROVIDER_SITE_OTHER): Payer: 59 | Admitting: Family Medicine

## 2010-05-23 DIAGNOSIS — J069 Acute upper respiratory infection, unspecified: Secondary | ICD-10-CM

## 2010-05-23 MED ORDER — AZITHROMYCIN 250 MG PO TABS: ORAL_TABLET | ORAL | Status: AC

## 2010-05-23 NOTE — Progress Notes (Signed)
SUBJECTIVE:  Jeff Ward is a 60 y.o. male who complains of coryza, congestion, sore throat, post nasal drip, dry cough and myalgias for 5 days. He denies a history of chest pain, fevers, nausea and weight loss and denies a history of asthma. Patient admits to smoke cigarettes.   The PMH, PSH, Social History, Family History, Medications, and allergies have been reviewed in [facility], and have been updated if relevant.  OBJECTIVE: BP 150/84  Pulse 57  Temp(Src) 98.5 F (36.9 C) (Oral)  Ht 5\' 5"  (1.651 m)  Wt 188 lb 6.4 oz (85.458 kg)  BMI 31.35 kg/m2  He appears well, vital signs are as noted. Ears normal.  Throat and pharynx normal.  Neck supple. No adenopathy in the neck. Nose is congested. Sinuses non tender. The chest is clear, without wheezes or rales.  ASSESSMENT:  viral upper respiratory illness  PLAN: Symptomatic therapy suggested: push fluids, rest and return office visit prn if symptoms persist or worsen. Lack of antibiotic effectiveness discussed with him.  Given rx for Zpack to fill only if symptoms progress.  Call or return to clinic prn if these symptoms worsen or fail to improve as anticipated.

## 2010-06-18 ENCOUNTER — Ambulatory Visit (INDEPENDENT_AMBULATORY_CARE_PROVIDER_SITE_OTHER)
Admission: RE | Admit: 2010-06-18 | Discharge: 2010-06-18 | Disposition: A | Discharge status: Home / Self Care | Payer: 59 | Source: Ambulatory Visit | Attending: Family Medicine | Admitting: Family Medicine

## 2010-06-18 ENCOUNTER — Ambulatory Visit (INDEPENDENT_AMBULATORY_CARE_PROVIDER_SITE_OTHER): Payer: 59 | Admitting: Family Medicine

## 2010-06-18 ENCOUNTER — Encounter: Payer: Self-pay | Admitting: Family Medicine

## 2010-06-18 VITALS — BP 150/80 | HR 67 | Temp 98.4°F | Wt 191.8 lb

## 2010-06-18 DIAGNOSIS — R509 Fever, unspecified: Secondary | ICD-10-CM | POA: Insufficient documentation

## 2010-06-18 MED ORDER — MELOXICAM 7.5 MG PO TABS: 7.5000 mg | ORAL_TABLET | Freq: Every day | ORAL | Status: DC

## 2010-06-18 MED ORDER — LANSOPRAZOLE 30 MG PO CPDR: 30.0000 mg | DELAYED_RELEASE_CAPSULE | Freq: Every day | ORAL | Status: DC

## 2010-06-18 MED ORDER — TRAMADOL HCL 50 MG PO TABS: 50.0000 mg | ORAL_TABLET | Freq: Four times a day (QID) | ORAL | Status: DC | PRN

## 2010-06-18 MED ORDER — LISINOPRIL 20 MG PO TABS: 20.0000 mg | ORAL_TABLET | Freq: Every day | ORAL | Status: DC

## 2010-06-18 MED ORDER — TRAMADOL HCL 50 MG PO TABS: 50.0000 mg | ORAL_TABLET | Freq: Four times a day (QID) | ORAL | Status: AC | PRN

## 2010-06-18 NOTE — Progress Notes (Signed)
SUBJECTIVE:  Jeff Ward is a 60 y.o. male who complains of recurrent fevers. Saw him last month for URI symptoms, those have resolved.    Since then, fevers several times a week, Tmax 102. Having night sweats for several months now.  Last month, we assumed they were due to his URI symptoms. No cough.   No decreased appetite. Weight stable. Wt Readings from Last 3 Encounters:  06/18/10 191 lb 12.8 oz (87 kg)  05/23/10 188 lb 6.4 oz (85.458 kg)  05/09/10 189 lb 6.4 oz (85.911 kg)  No CP or SOB. At times, he has body aches as well, mainly in shoulders and hips.  Works outside, not sure if he has had a tick bite.  The PMH, PSH, Social History, Family History, Medications, and allergies have been reviewed in [facility], and have been updated if relevant.  ROS: See HPI   OBJECTIVE: BP 150/80  Pulse 67  Temp(Src) 98.4 F (36.9 C) (Oral)  Wt 191 lb 12.8 oz (87 kg) Gen:  Alert, NAD. Resp:  CTA bilaterally CVS:  RRR, no murmurs Abd:  Soft, NT, pos BS Ext:  Small, less than 1 cm circular erythematous plaque on right forearm, otherwise no rahses.    A/P: Recurrent fevers- unclear etiology. ?possible tickborne illnesses RMSF titers, CMET. Will also check CBC, PSA and CXR to rule out occult malignancy. Last colonoscopy 2008.

## 2010-06-19 ENCOUNTER — Other Ambulatory Visit: Payer: Self-pay | Admitting: Family Medicine

## 2010-06-25 ENCOUNTER — Telehealth: Payer: Self-pay | Admitting: *Deleted

## 2010-06-25 NOTE — Telephone Encounter (Signed)
Pt called upset that he hasn't received his lab results. They were sent to [facility] and results were sent offsite to scan into Epic. He states he is still having the same issues as when he was seen last week and wants results. I had results faxed over and I will place them in your inbox. Nurse can reach him at home or leave a message for him to return call.

## 2010-06-25 NOTE — Telephone Encounter (Signed)
I did see his lab results and they should have been called to him. There was nothing abnormal in his lab results if I remember correctly.

## 2010-06-25 NOTE — Telephone Encounter (Signed)
Left message on home answering advising patient as instructed.

## 2010-06-27 ENCOUNTER — Telehealth: Payer: Self-pay | Admitting: *Deleted

## 2010-06-27 DIAGNOSIS — R61 Generalized hyperhidrosis: Secondary | ICD-10-CM

## 2010-06-27 NOTE — Telephone Encounter (Signed)
Addended by: Baldomero Lamy on: 06/27/2010 09:04 AM   Modules accepted: Orders

## 2010-06-27 NOTE — Telephone Encounter (Signed)
Patient says that he is still having fever several times per week, having night sweats, feels extremely tired. He says that he is really concerned and is asking if there are any other labs that he could have done to figure out what is going on.

## 2010-06-27 NOTE — Telephone Encounter (Signed)
Patient advised as instructed via message left on machine at home.  Advised patient that he can come in for labs at his convenience.

## 2010-06-27 NOTE — Telephone Encounter (Signed)
I'm concerned too. Yes, let's order some more extensive lab work.  I have placed future orders. If it is normal, I would like to order a CT of his chest since xray was normal.

## 2010-06-28 ENCOUNTER — Encounter: Payer: Self-pay | Admitting: Family Medicine

## 2010-06-28 ENCOUNTER — Encounter: Payer: Self-pay | Admitting: *Deleted

## 2010-07-08 ENCOUNTER — Other Ambulatory Visit: Payer: Self-pay | Admitting: Family Medicine

## 2010-07-08 ENCOUNTER — Ambulatory Visit (INDEPENDENT_AMBULATORY_CARE_PROVIDER_SITE_OTHER): Payer: 59 | Admitting: Family Medicine

## 2010-07-08 ENCOUNTER — Encounter: Payer: Self-pay | Admitting: Family Medicine

## 2010-07-08 VITALS — BP 160/80 | HR 69 | Temp 98.7°F | Wt 187.5 lb

## 2010-07-08 DIAGNOSIS — Z136 Encounter for screening for cardiovascular disorders: Secondary | ICD-10-CM

## 2010-07-08 DIAGNOSIS — R61 Generalized hyperhidrosis: Secondary | ICD-10-CM

## 2010-07-08 DIAGNOSIS — R509 Fever, unspecified: Secondary | ICD-10-CM

## 2010-07-08 DIAGNOSIS — R634 Abnormal weight loss: Secondary | ICD-10-CM

## 2010-07-08 NOTE — Progress Notes (Signed)
SUBJECTIVE:  Jeff Ward is a 60 y.o. male here for follow up with recurrent fevers.   Fevers started with URI symptoms a couple of months ago, those have resolved.    Since then, fevers several times a week, Tmax 102. Having night sweats for several months now.  Last month, we assumed they were due to his URI symptoms. No cough.   No decreased appetite. Has lost a few pounds but overall weight is stable.   No CP or SOB. Wt Readings from Last 3 Encounters:  07/08/10 187 lb 8 oz (85.049 kg)  06/18/10 191 lb 12.8 oz (87 kg)  05/23/10 188 lb 6.4 oz (85.458 kg)   Colonoscopy UTD.  At times, he has body aches as well, mainly in shoulders and hips.  CBC, TSH, HIV, PSA, RMSF, SPEP unremarkable.    Works outside, not sure if he has had a tick bite.  The PMH, PSH, Social History, Family History, Medications, and allergies have been reviewed in [facility], and have been updated if relevant.  ROS: See HPI   OBJECTIVE: BP 160/80  Pulse 69  Temp(Src) 98.7 F (37.1 C) (Oral)  Wt 187 lb 8 oz (85.049 kg)  Gen:  Alert, NAD. Lymph nodes:  No adenopathy Resp:  CTA bilaterally CVS:  RRR, no murmurs Abd:  Soft, NT, pos BS Ext:  Small, less than 1 cm circular erythematous plaque on right forearm, otherwise no rahses.    A/P: Recurrent fevers- etiology remains unclear. EKG today, NSR. Will refer for bone marrow biopsy to rule out any other malignancies.

## 2010-07-09 ENCOUNTER — Telehealth: Payer: Self-pay | Admitting: Family Medicine

## 2010-07-09 ENCOUNTER — Encounter: Payer: Self-pay | Admitting: *Deleted

## 2010-07-09 NOTE — Telephone Encounter (Signed)
Note printed and given to [facility].

## 2010-07-09 NOTE — Telephone Encounter (Signed)
Patient is scheduled for bone marrow biopsy on 07/15/2010 at 9am at [facility]. Because he does do heavy lifting and you cannott due any lifting 24 hrs after the biopsy, they need you to write him a work excuse . It needs to excuse him from work on 07/15/2010 and also 07/16/2010 . He can resume and go back to work on 07/17/2010. Please give the note to Shirlee Limerick and she will fax it to [employer] at [phone]. They will give him the work note when he checks in to [facility] for the biopsy. Questions call [name] at [phone].

## 2010-07-09 NOTE — Telephone Encounter (Signed)
Nikki, Please write work excuse note as written below.

## 2010-07-09 NOTE — Telephone Encounter (Signed)
Work excuse faxed to [name] at [employer]. MK

## 2010-07-15 ENCOUNTER — Ambulatory Visit (HOSPITAL_COMMUNITY): Payer: 59

## 2010-07-15 ENCOUNTER — Other Ambulatory Visit: Payer: Self-pay | Admitting: Interventional Radiology

## 2010-07-15 ENCOUNTER — Other Ambulatory Visit: Payer: Self-pay | Admitting: Family Medicine

## 2010-07-15 ENCOUNTER — Ambulatory Visit (HOSPITAL_COMMUNITY)
Admission: RE | Admit: 2010-07-15 | Discharge: 2010-07-15 | Disposition: A | Discharge status: Home / Self Care | Payer: 59 | Source: Ambulatory Visit | Attending: Family Medicine | Admitting: Family Medicine

## 2010-07-15 DIAGNOSIS — R509 Fever, unspecified: Secondary | ICD-10-CM | POA: Insufficient documentation

## 2010-07-15 DIAGNOSIS — R252 Cramp and spasm: Secondary | ICD-10-CM | POA: Insufficient documentation

## 2010-07-15 DIAGNOSIS — I10 Essential (primary) hypertension: Secondary | ICD-10-CM | POA: Insufficient documentation

## 2010-07-15 DIAGNOSIS — R634 Abnormal weight loss: Secondary | ICD-10-CM

## 2010-07-15 DIAGNOSIS — M129 Arthropathy, unspecified: Secondary | ICD-10-CM | POA: Insufficient documentation

## 2010-07-15 DIAGNOSIS — R61 Generalized hyperhidrosis: Secondary | ICD-10-CM | POA: Insufficient documentation

## 2010-07-15 DIAGNOSIS — R5381 Other malaise: Secondary | ICD-10-CM | POA: Insufficient documentation

## 2010-07-15 DIAGNOSIS — R5383 Other fatigue: Secondary | ICD-10-CM | POA: Insufficient documentation

## 2010-07-15 LAB — CBC
MCH: 32.3 pg (ref 26.0–34.0)
MCHC: 34.9 g/dL (ref 30.0–36.0)
MCV: 92.5 fL (ref 78.0–100.0)
Platelets: 151 10*3/uL (ref 150–400)
RDW: 12.4 % (ref 11.5–15.5)
WBC: 5.2 10*3/uL (ref 4.0–10.5)

## 2010-07-17 ENCOUNTER — Encounter: Payer: Self-pay | Admitting: Family Medicine

## 2010-07-17 ENCOUNTER — Telehealth: Payer: Self-pay | Admitting: Family Medicine

## 2010-07-17 ENCOUNTER — Encounter: Payer: Self-pay | Admitting: *Deleted

## 2010-07-17 NOTE — Telephone Encounter (Signed)
[name] from [employer] called to tell [name] that the patient had his biopsy done on Monday, 07/15/2010. He is still not feeling well enough to go to work today and needs another Drs note to keep him out of work today, 07/17/2010. The bosses name is [name] and the fax# is [phone]. Please fax the note ASAP. The patient called [name] B/C she was the one who gave him the work note originally and called him about the biopsy. She said he was still feeling dizzy. The patients home # [phone].

## 2010-07-17 NOTE — Telephone Encounter (Signed)
Nikki, Please write work not for Mr. Longoria as written below. Thanks

## 2010-07-17 NOTE — Telephone Encounter (Signed)
Notes faxed to [name] at [phone].  Patient notified.

## 2010-07-18 ENCOUNTER — Other Ambulatory Visit: Payer: Self-pay | Admitting: Family Medicine

## 2010-07-18 MED ORDER — TADALAFIL 20 MG PO TABS: 20.0000 mg | ORAL_TABLET | Freq: Every day | ORAL | Status: DC | PRN

## 2010-07-19 ENCOUNTER — Encounter: Payer: Self-pay | Admitting: Family Medicine

## 2010-07-19 ENCOUNTER — Ambulatory Visit (INDEPENDENT_AMBULATORY_CARE_PROVIDER_SITE_OTHER): Payer: 59 | Admitting: Family Medicine

## 2010-07-19 VITALS — BP 140/72 | HR 72 | Temp 99.2°F | Wt 189.2 lb

## 2010-07-19 DIAGNOSIS — R61 Generalized hyperhidrosis: Secondary | ICD-10-CM

## 2010-07-19 MED ORDER — TESTOSTERONE 50 MG/5GM (1%) TD GEL: 5.0000 g | Freq: Every day | TRANSDERMAL | Status: DC

## 2010-07-19 NOTE — Progress Notes (Signed)
SUBJECTIVE:  Jeff Ward is a 60 y.o. male here for follow up with recurrent fevers.   Fevers started with URI symptoms a couple of months ago, those have resolved.    Since then, fevers several times a week, Tmax 102.  Having night sweats for several months now.  Last month, we assumed they were due to his URI symptoms. No cough.   No decreased appetite. Has lost a few pounds but overall weight is stable.    No CP or SOB. Wt Readings from Last 3 Encounters:  07/19/10 189 lb 4 oz (85.843 kg)  07/08/10 187 lb 8 oz (85.049 kg)  06/18/10 191 lb 12.8 oz (87 kg)     Colonoscopy UTD.  At times, he has body aches as well, mainly in shoulders and hips.  CBC, TSH, HIV, PSA, RMSF, SPEP, EKG unremarkable.   .   Bone marrow biopsy neg (mild microcytic anemia). Testerone mildly low- 296.   The PMH, PSH, Social History, Family History, Medications, and allergies have been reviewed in [facility], and have been updated if relevant.  ROS: See HPI   OBJECTIVE: BP 140/72  Pulse 72  Temp(Src) 99.2 F (37.3 C) (Oral)  Wt 189 lb 4 oz (85.843 kg)  Gen:  Alert, NAD. Lymph nodes:  No adenopathy Resp:  CTA bilaterally CVS:  RRR, no murmurs Abd:  Soft, NT, pos BS   A/P: 1. Night sweats    etiology remains unclear. Will treat for low T- would like him to get in 300-500 range as it can cause night sweats. Rx given for labs at [facility] in 8 weeks. Pt to call in 2 weeks with an update. If symptoms continue, refer to endocrinology. The patient indicates understanding of these issues and agrees with the plan.

## 2010-07-23 ENCOUNTER — Telehealth: Payer: Self-pay | Admitting: *Deleted

## 2010-07-23 NOTE — Telephone Encounter (Signed)
Form from [facility] is on your desk.  They want clarification on diclofenac script, directions are missing.

## 2010-07-24 NOTE — Telephone Encounter (Signed)
In my box

## 2010-07-24 NOTE — Telephone Encounter (Signed)
Completed form faxed to [facility] at [phone].

## 2010-08-08 ENCOUNTER — Telehealth: Payer: Self-pay | Admitting: *Deleted

## 2010-08-08 DIAGNOSIS — R509 Fever, unspecified: Secondary | ICD-10-CM

## 2010-08-08 NOTE — Telephone Encounter (Signed)
Patient says that he is still having night sweats and running fevers on and off. His fevers have been running around 99 to 102 and come and go at various times. He is asking what to do at this point.

## 2010-08-08 NOTE — Telephone Encounter (Signed)
Left message on machine at home for patient to return call. 

## 2010-08-08 NOTE — Telephone Encounter (Signed)
Noted  Referral placed.

## 2010-08-08 NOTE — Telephone Encounter (Signed)
Patient advised as instructed via telephone.  He is willing to see someone from Infectious Disease.  He will wait to hear from [facility].  It is ok for Shirlee Limerick to speak with his wife regarding referral.

## 2010-08-08 NOTE — Telephone Encounter (Signed)
Left message with patients supervisor at [phone] to have patient return call.

## 2010-08-08 NOTE — Telephone Encounter (Signed)
I'm sorry to hear that. I think at this point, it would be best to refer him to infectious disease to get their opinion. I will place referral.

## 2010-08-15 ENCOUNTER — Ambulatory Visit (INDEPENDENT_AMBULATORY_CARE_PROVIDER_SITE_OTHER): Payer: 59 | Admitting: Internal Medicine

## 2010-08-15 ENCOUNTER — Encounter: Payer: Self-pay | Admitting: Internal Medicine

## 2010-08-15 VITALS — BP 180/96 | HR 62 | Temp 98.3°F | Wt 188.8 lb

## 2010-08-15 DIAGNOSIS — R509 Fever, unspecified: Secondary | ICD-10-CM

## 2010-08-15 NOTE — Assessment & Plan Note (Signed)
Prolonged fever - at this time, it appears to have resolved.  He has had no further febrile episodes greater than 100.4 for more than 2 weeks.  The DDx of the fever is medicines, viral illness, cancer, thyroid.  Work up has essentially been unrevealing.  At this time, with no symptoms and no further fever, I will do watchful waiting.  If he though spikes a fever up to 100.4 or higher not associated with obvious illness, he was instructed to call me and I will pursue further investigation, including a CT scan with and without of the C/A/P, hepatitis studies, HIV viral load (for acute), quanterferon gold.     If no further fever, likely his night sweats is related to his endocrine issues, doubt Tb with no epidemiological risks.

## 2010-08-15 NOTE — Progress Notes (Signed)
  Subjective:    Patient ID: Jeff Ward, male    DOB: 12-26-1950, 60 y.o.   MRN: [id number]  HPI Patient seen as a new patient with a complaint of fever of unknown origin.  By his report, he has been febrile for the last month with episodes of fever up to 102.  Other readings have been up to 99.  He states the high fevers occur approximately 3 times per week.  He reports now though his max temp over the last 2 weeks has been no higher than 99.  He does also report night sweats that have been occuring, though have been relieved some by starting the Testosterone gel.  He reports he soaks the sheets when it does occur.  No history of TB, no jail, no Tb contacts.  Work up to date has been unrevealing including HIV, CBC, RMSF, bone marrow, CXR.  Here for consultation regarding fever.      Review of Systems  Constitutional: Negative.   HENT: Negative.   Eyes: Negative.   Respiratory: Negative.   Cardiovascular: Negative.   Gastrointestinal: Negative.   Genitourinary: Negative.   Musculoskeletal: Negative.   Skin: Negative.   Neurological: Negative.   Hematological: Negative.   Psychiatric/Behavioral: Negative.        Objective:   Physical Exam  Constitutional: He is oriented to person, place, and time. He appears well-developed and well-nourished. No distress.  HENT:  Mouth/Throat: Oropharynx is clear and moist. No oropharyngeal exudate.  Eyes: Conjunctivae are normal. Pupils are equal, round, and reactive to light. Right eye exhibits no discharge. Left eye exhibits no discharge. No scleral icterus.  Neck: Normal range of motion. Neck supple. No thyromegaly present.  Cardiovascular: Normal rate, regular rhythm and normal heart sounds.  Exam reveals no gallop and no friction rub.   No murmur heard. Pulmonary/Chest: Effort normal and breath sounds normal. No respiratory distress. He has no wheezes. He has no rales.  Abdominal: Soft. Bowel sounds are normal. He exhibits no distension. There  is no tenderness. There is no rebound.  Musculoskeletal: Normal range of motion. He exhibits no edema and no tenderness.  Lymphadenopathy:    He has no cervical adenopathy.  Neurological: He is alert and oriented to person, place, and time. No cranial nerve deficit.  Skin: Skin is warm and dry. No rash noted. No erythema. No pallor.  Psychiatric: He has a normal mood and affect. His behavior is normal. Judgment and thought content normal.          Assessment & Plan:

## 2010-09-20 ENCOUNTER — Encounter: Payer: Self-pay | Admitting: Family Medicine

## 2010-09-20 ENCOUNTER — Ambulatory Visit (INDEPENDENT_AMBULATORY_CARE_PROVIDER_SITE_OTHER): Payer: 59 | Admitting: Family Medicine

## 2010-09-20 VITALS — BP 140/70 | HR 75 | Temp 98.7°F | Wt 186.0 lb

## 2010-09-20 DIAGNOSIS — R51 Headache: Secondary | ICD-10-CM

## 2010-09-20 DIAGNOSIS — R509 Fever, unspecified: Secondary | ICD-10-CM

## 2010-09-20 DIAGNOSIS — R519 Headache, unspecified: Secondary | ICD-10-CM | POA: Insufficient documentation

## 2010-09-20 MED ORDER — TESTOSTERONE 50 MG/5GM (1%) TD GEL: 5.0000 g | Freq: Every day | TRANSDERMAL | Status: DC

## 2010-09-20 MED ORDER — SUMATRIPTAN SUCCINATE 50 MG PO TABS: 50.0000 mg | ORAL_TABLET | Freq: Once | ORAL | Status: DC | PRN

## 2010-09-20 NOTE — Progress Notes (Signed)
Subjective:    Patient ID: Jeff Ward, male    DOB: [birth year], 60 y.o.   MRN: [id number]  HPI  60 yo here for headaches.  [name], fevers and night sweats. Work up has been [employer], including RPR, HIV, CBC, TSH, PSA, B12, RMSF, bone marrow biopsy, CXR.  Referred to ID for further evaluation Note reviewed from Dr. Luciana Axe who felts his symptoms had resolved and no further work up needed. Mentioned CT scan, hepatitis studies if symptoms returned.  Headaches- was exposed to formaldehyde fumes at very high levels at work 3 weeks ago. Since then, every day, he develops a frontal headache (usually bilateral) or orbital after he has been at work a few hours. Usually associated with photophobia but denies nausea and vomiting. No focal neurological deficits. Ibuprofen does not help.  BP has been under control, mildly elevated today.  Patient Active Problem List  Diagnoses  . COLONIC POLYPS  . HYPERLIPIDEMIA  . ERECTILE DYSFUNCTION  . ESSENTIAL HYPERTENSION, BENIGN  . PUD  . BLOOD IN STOOL  . ARTHRITIS, GENERALIZED  . SHOULDER PAIN, RIGHT  . LEG CRAMPS  . PALPITATIONS  . SNORING  . OTHER NONSPECIFIC ABNORMAL SERUM ENZYME LEVELS  . LIVER FUNCTION TESTS, ABNORMAL  . COMPRESSION FRACTURE, SPINE  . LUMBAR STRAIN  . CARPAL TUNNEL SYNDROME, BILATERAL, HX OF  . BENIGN PROSTATIC HYPERTROPHY, HX OF  . Night sweats  . Degenerative disc disease, lumbar  . Fever   Past Medical History  Diagnosis Date  . 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 Hyperlipidemia 06/2002   Past Surgical History  Procedure Date  . Carpal tunnel release 2005    and trigger finger   History  Substance Use Topics  . Smoking status: Former Smoker    Types: Cigars  . Smokeless tobacco: Never Used  . Alcohol Use: 1.2 oz/week    2 Cans of beer per week     2-3 beers a day   Family History  Problem Relation Age of Onset  . Diabetes  Mother   . Cancer Father     history of melanona  . Diabetes Sister   . Cancer Paternal Aunt     breast  . Alcohol abuse Maternal Grandfather    Allergies  Allergen Reactions  . Bee   . Sulfonamide Derivatives     REACTION: unconsciousness   Current Outpatient Prescriptions on File Prior to Visit  Medication Sig Dispense Refill  . glucosamine-chondroitin 500-400 MG tablet Take 1 tablet by mouth 2 (two) times daily.        . lansoprazole (PREVACID) 30 MG capsule Take 1 capsule (30 mg total) by mouth daily.  90 capsule  3  . lisinopril (PRINIVIL,ZESTRIL) 20 MG tablet TAKE 1 TABLET BY MOUTH EVERY DAY  30 tablet  6  . meloxicam (MOBIC) 7.5 MG tablet Take 1 tablet (7.5 mg total) by mouth daily. Take 1-2 once daily by mouth as needed  90 tablet  3  . Multiple Vitamin (MULTIVITAMIN) tablet Take 1 tablet by mouth daily.        . Potassium 99 MG TABS Take 1 tablet by mouth 2 (two) times daily.        . sertraline (ZOLOFT) 50 MG tablet Take 50 mg by mouth daily.        Marland Kitchen testosterone (ANDROGEL) 50 MG/5GM GEL Place 5 g onto the skin daily.  30 Tube  0  .  traMADol (ULTRAM) 50 MG tablet Take 1 tablet (50 mg total) by mouth every 6 (six) hours as needed for pain.  120 tablet  3   The PMH, PSH, Social History, Family History, Medications, and allergies have been reviewed in [facility], and have been updated if relevant.  Review of Systems See HPI    Objective:   Physical Exam BP 140/70  Pulse 75  Temp(Src) 98.7 F (37.1 C) (Oral)  Wt 186 lb (84.369 kg) General:  overweght male in NAD Eyes:  PERRL Ears:  External ear exam shows no significant lesions or deformities.  Otoscopic examination reveals clear canals, tympanic membranes are intact bilaterally without bulging, retraction, inflammation or discharge. Hearing is grossly normal bilaterally. Nose:  External nasal examination shows no deformity or inflammation. Nasal mucosa are pink and moist without lesions or exudates. Mouth:  Oral mucosa and  oropharynx without lesions or exudates.  Teeth in good repair. Neck:  no carotid bruit or thyromegaly no cervical or supraclavicular lymphadenopathy  Lungs:  Normal respiratory effort, chest expands symmetrically. Lungs are clear to auscultation, no crackles or wheezes. Heart:  Normal rate and regular rhythm. S1 and S2 normal without gallop, murmur, click, rub or other extra sounds. Abdomen:  Bowel sounds positive,abdomen soft and non-tender without masses, organomegaly or hernias noted. Pulses:  R and L posterior tibial pulses are full and equal bilaterally  Extremities:  no edema  Neuro:  CN II-XII intact, strength and reflexes normal bilaterally, normal gait.        Assessment & Plan:   1. Headache   ? 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 I recommended limiting exposure, wearing proper protective gear. Imitrex as needed. If symptoms do not improve, will get a head CT. No current red flag symptoms and [employer] is now involved at his place of employment.   2. Fever    Resolved.  Etiology remains unclear, he will let me or ID physician know if fevers return.

## 2010-09-24 ENCOUNTER — Encounter: Payer: Self-pay | Admitting: Family Medicine

## 2010-09-24 ENCOUNTER — Ambulatory Visit (INDEPENDENT_AMBULATORY_CARE_PROVIDER_SITE_OTHER): Payer: 59 | Admitting: Family Medicine

## 2010-09-24 ENCOUNTER — Encounter: Payer: Self-pay | Admitting: Neurology

## 2010-09-24 VITALS — BP 140/70 | HR 68 | Temp 98.6°F | Wt 185.5 lb

## 2010-09-24 DIAGNOSIS — R51 Headache: Secondary | ICD-10-CM

## 2010-09-24 NOTE — Progress Notes (Signed)
Subjective:    Patient ID: Jeff Ward, male    DOB: 1950-08-11, 60 y.o.   MRN: [id number]  HPI  60 yo here for headaches.  [name], fevers and night sweats. Work up has been [employer], including RPR, HIV, CBC, TSH, PSA, B12, RMSF, bone marrow biopsy, CXR.  Referred to ID for further evaluation Note reviewed from Dr. Luciana Axe who felts his symptoms had resolved and no further work up needed. Mentioned CT scan, hepatitis studies if symptoms returned.  Headaches- was exposed to formaldehyde/acetal fumes at very high levels at work several weeks ago. I saw him on 8/24 for this complaint and felt they were consistent with migraines-frontal headache (usually bilateral) or orbital after he has been at work a few hours. Usually associated with photophobia but denies nausea and vomiting. No focal neurological deficits. Gave him rx for as needed Imitrex and discussed decreasing work exposure. Cannot take Imitrex at work. Had no headache over the weekend when he was not working. BP has been intermittently elevated but pt thinks this is due to headaches and headaches not due to elevated BP since he is frequently normotensive when he is not in pain.  Patient Active Problem List  Diagnoses  . COLONIC POLYPS  . HYPERLIPIDEMIA  . ERECTILE DYSFUNCTION  . ESSENTIAL HYPERTENSION, BENIGN  . PUD  . BLOOD IN STOOL  . ARTHRITIS, GENERALIZED  . SHOULDER PAIN, RIGHT  . LEG CRAMPS  . PALPITATIONS  . SNORING  . OTHER NONSPECIFIC ABNORMAL SERUM ENZYME LEVELS  . LIVER FUNCTION TESTS, ABNORMAL  . COMPRESSION FRACTURE, SPINE  . LUMBAR STRAIN  . CARPAL TUNNEL SYNDROME, BILATERAL, HX OF  . BENIGN PROSTATIC HYPERTROPHY, HX OF  . Night sweats  . Degenerative disc disease, lumbar  . Fever  . Headache   Past Medical History  Diagnosis Date  . 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 Hyperlipidemia 06/2002   Past Surgical History    Procedure Date  . Carpal tunnel release 2005    and trigger finger   History  Substance Use Topics  . Smoking status: Former Smoker    Types: Cigars  . Smokeless tobacco: Never Used  . Alcohol Use: 1.2 oz/week    2 Cans of beer per week     2-3 beers a day   Family History  Problem Relation Age of Onset  . Diabetes Mother   . Cancer Father     history of melanona  . Diabetes Sister   . Cancer Paternal Aunt     breast  . Alcohol abuse Maternal Grandfather    Allergies  Allergen Reactions  . Bee   . Sulfonamide Derivatives     REACTION: unconsciousness   Current Outpatient Prescriptions on File Prior to Visit  Medication Sig Dispense Refill  . glucosamine-chondroitin 500-400 MG tablet Take 1 tablet by mouth 2 (two) times daily.        . lansoprazole (PREVACID) 30 MG capsule Take 1 capsule (30 mg total) by mouth daily.  90 capsule  3  . lisinopril (PRINIVIL,ZESTRIL) 20 MG tablet TAKE 1 TABLET BY MOUTH EVERY DAY  30 tablet  6  . meloxicam (MOBIC) 7.5 MG tablet Take 1 tablet (7.5 mg total) by mouth daily. Take 1-2 once daily by mouth as needed  90 tablet  3  . Multiple Vitamin (MULTIVITAMIN) tablet Take 1 tablet by mouth daily.        . Potassium 99  MG TABS Take 1 tablet by mouth 2 (two) times daily.        . sertraline (ZOLOFT) 50 MG tablet Take 50 mg by mouth daily.        . SUMAtriptan (IMITREX) 50 MG tablet Take 1 tablet (50 mg total) by mouth once as needed for migraine.  30 tablet  2  . testosterone (ANDROGEL) 50 MG/5GM GEL Place 5 g onto the skin daily.  30 Tube  0  . traMADol (ULTRAM) 50 MG tablet Take 1 tablet (50 mg total) by mouth every 6 (six) hours as needed for pain.  120 tablet  3   The PMH, PSH, Social History, Family History, Medications, and allergies have been reviewed in [facility], and have been updated if relevant.  Review of Systems See HPI    Objective:   Physical Exam  BP 140/70  Pulse 68  Temp(Src) 98.6 F (37 C) (Oral)  Wt 185 lb 8 oz (84.142  kg)  General:  overweght male in NAD Eyes:  PERRL Ears:  External ear exam shows no significant lesions or deformities.  Otoscopic examination reveals clear canals, tympanic membranes are intact bilaterally without bulging, retraction, inflammation or discharge. Hearing is grossly normal bilaterally. Nose:  External nasal examination shows no deformity or inflammation. Nasal mucosa are pink and moist without lesions or exudates. Mouth:  Oral mucosa and oropharynx without lesions or exudates.  Teeth in good repair. Neck:  no carotid bruit or thyromegaly no cervical or supraclavicular lymphadenopathy  Lungs:  Normal respiratory effort, chest expands symmetrically. Lungs are clear to auscultation, no crackles or wheezes. Heart:  Normal rate and regular rhythm. S1 and S2 normal without gallop, murmur, click, rub or other extra sounds. Abdomen:  Bowel sounds positive,abdomen soft and non-tender without masses, organomegaly or hernias noted. Pulses:  R and L posterior tibial pulses are full and equal bilaterally  Extremities:  no edema  Neuro:  CN II-XII intact, strength and reflexes normal bilaterally, normal gait.        Assessment & Plan:   1. Headache   ? 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 I recommended limiting exposure, wearing proper protective gear. Imitrex as needed but cannot take at work. Will refer to neurology for further work up of headaches.    2. Fever    Resolved.  Etiology remains unclear, he will let me or ID physician know if fevers return.

## 2010-09-24 NOTE — Patient Instructions (Signed)
Please stop by to see Jeff Ward on your way out. 

## 2010-09-25 ENCOUNTER — Ambulatory Visit (INDEPENDENT_AMBULATORY_CARE_PROVIDER_SITE_OTHER): Payer: 59 | Admitting: Neurology

## 2010-09-25 ENCOUNTER — Encounter: Payer: Self-pay | Admitting: Neurology

## 2010-09-25 ENCOUNTER — Other Ambulatory Visit (INDEPENDENT_AMBULATORY_CARE_PROVIDER_SITE_OTHER): Payer: 59

## 2010-09-25 VITALS — BP 168/82 | HR 84 | Ht 65.0 in | Wt 187.0 lb

## 2010-09-25 DIAGNOSIS — R519 Headache, unspecified: Secondary | ICD-10-CM

## 2010-09-25 DIAGNOSIS — R51 Headache: Secondary | ICD-10-CM

## 2010-09-25 LAB — BASIC METABOLIC PANEL
CO2: 28 mEq/L (ref 19–32)
Chloride: 103 mEq/L (ref 96–112)
Glucose, Bld: 96 mg/dL (ref 70–99)
Sodium: 140 mEq/L (ref 135–145)

## 2010-09-25 NOTE — Patient Instructions (Addendum)
You have been scheduled for a MRI of your brain at [facility] on Tuesday, Sept. 5th at 5:00pm.  Please arrive by 4:45pm.  Go to the basement today to have your labs drawn.  cc:  Ruthe Mannan, MD

## 2010-09-25 NOTE — Progress Notes (Signed)
Dear Dr. Dayton Martes,  Thank you for having me see [name] in the [facility] Neurology clinic for consultation on his problems with headaches. As you may recall he is a 60 year old man who has no previous history of headaches but was exposed to noxious chemical because of a leak at work. Since that time whenever he returns to work he has bilateral frontal throbbing headaches as well as photophobia but no phonophobia. On one rare occasion he did have a nausea associated with them. It takes about 2 hours for them to remit after leaving work.  These headaches are not associated with any visual or sensory phenomena. He is not taking any medication for them while at work. You gave him an Imitrex abortive but he has not used this. He has not been put on any preventative medication. Apparently there is some concerns that there are still leaks of the suspect chemical at work.  The patient denies having previous headaches that are similar. He has had sinus headaches that tend to be below his eyes. He's never been disabled with headache.  Past medical history: Is significant for hyperlipidemia and lumbar degenerative disc disease  Social history: He is a social alcohol user but does not smoke. His work at his current job for 3 years. He is planning on retiring in 11 months.  Family history: No history of migraine headaches or neurologic disease.  Review of systems: 14 systems were reviewed and are significant for chronic arthritis pain grade he also has a problem with chronic back pain. He was having problems with night sweats but this has  remitted. He was placed on testosterone for a slightly low level but it is unclear whether this is helping.  Examination:  Filed Vitals:   09/25/10 0815  BP: 168/82  Pulse: 84   In general he is a well-appearing slightly overweight gentleman in no acute distress. Head and neck examination reveals no obvious tender points on the skull. This includes occipital nerve and  supraorbital nerve areas. Palpation of the sinuses did not reveal any tenderness. TMJ joint seems normal. He has good range of motion of his neck.  Cardiovascular system:  No carotid bruits.  Regular rate and rhythm.  Normal and symmetric peripheral pulses.Chest Exam:  Clear to auscultation bilaterally.  No wheezes or crackles.Skin:  No obvous rashes, hypopigmented macules or neurofibromas. Normal extremity  Neurologically he's oriented x4 and his language is intact. Visual fields are full to confrontation. Pupils are equally round and reactive to light. Extraocular movements did not reveal any saccadic fusion. Fundi reveal optic discs without papilledema. Shoulder shrug is intact muscles of facial expression are symmetric. Facial sense is symmetric. Tongue uvula palate midline.  Motor exam reveals normal bulk and tone he has no pronator drift. Out of 5 muscle strength bilaterally.  Reflexes are 2+ throughout with both toes downgoing. Coordination reveals normal finger to nose testing. Sensation is intact to vibration in 4 limbs. He is casual gait and station are normal. Tandem gait is intact. Romberg is negative.  Impression: [name] is a 60 year old man with no history of headaches who is developed a migrainous-type headaches after a chemical exposure at work that recurs in the setting of returning to that environment. His neurologic exam is normal today. It sounds like he has been sensitized to this chemical because of the exposure. This is common in people who have an underlying predisposition to headaches. It is a little less usual in someone who has no significant  headache history. In order to rule out that he may have an underlying intracranial abnormality that is being unmasked I'm going to get an MRI of his brain with and without contrast. However I expect this to be normal. I think it is likely that these headaches will improve as he desensitizes. However, if they do not improve he may want to  use a respirator at work because of the fumes. If that is not successful in preventing the headaches then we could consider a preventative agent such as amitriptyline, propranolol or topiramate. For severe headaches it is appropriate for him to try the Imitrex but he should limit this to twice a week as you previously indicated.  Thank you for having [place] to see Jeff Ward in consultation. If you have any questions whatsoever please feel free to contact me.  Lupita Raider Modesto Charon, MD [facility]rology, [place]

## 2010-10-01 ENCOUNTER — Ambulatory Visit (HOSPITAL_COMMUNITY)
Admission: RE | Admit: 2010-10-01 | Discharge: 2010-10-01 | Disposition: A | Discharge status: Home / Self Care | Payer: 59 | Source: Ambulatory Visit | Attending: Neurology | Admitting: Neurology

## 2010-10-01 DIAGNOSIS — R519 Headache, unspecified: Secondary | ICD-10-CM

## 2010-10-01 DIAGNOSIS — J329 Chronic sinusitis, unspecified: Secondary | ICD-10-CM | POA: Insufficient documentation

## 2010-10-01 DIAGNOSIS — Z8673 Personal history of transient ischemic attack (TIA), and cerebral infarction without residual deficits: Secondary | ICD-10-CM | POA: Insufficient documentation

## 2010-10-01 DIAGNOSIS — R51 Headache: Secondary | ICD-10-CM | POA: Insufficient documentation

## 2010-10-01 MED ORDER — GADOBENATE DIMEGLUMINE 529 MG/ML IV SOLN
15.0000 mL | Freq: Once | INTRAVENOUS | Status: AC
  Administered 2010-10-01: 15 mL via INTRAVENOUS

## 2010-10-03 ENCOUNTER — Other Ambulatory Visit (INDEPENDENT_AMBULATORY_CARE_PROVIDER_SITE_OTHER): Payer: 59

## 2010-10-03 ENCOUNTER — Encounter: Payer: Self-pay | Admitting: Neurology

## 2010-10-03 ENCOUNTER — Ambulatory Visit (INDEPENDENT_AMBULATORY_CARE_PROVIDER_SITE_OTHER): Payer: 59 | Admitting: Neurology

## 2010-10-03 ENCOUNTER — Ambulatory Visit: Payer: 59 | Admitting: Neurology

## 2010-10-03 VITALS — BP 154/78 | HR 76 | Wt 184.0 lb

## 2010-10-03 DIAGNOSIS — R51 Headache: Secondary | ICD-10-CM

## 2010-10-03 LAB — SEDIMENTATION RATE: Sed Rate: 14 mm/hr (ref 0–22)

## 2010-10-03 NOTE — Patient Instructions (Signed)
Please go to the basement to have your labs drawn today.  Your MRA's have been scheduled at [facility] on Wed. Sept. 12th at 8:00am.  Please arrive by 7:45am

## 2010-10-03 NOTE — Progress Notes (Signed)
Dear Dr. Dayton Martes,  I saw Jeff Ward back in [facility] clinic today for problems with his migrainous like headaches that are exacerbated by exposure to to a chemical at work. He continues to have the headaches whenever he goes to work. Again they generally remit after he leaves work. They accompanied him frequently by photophobia and phonophobia. He tried  Imitrex you gave him and it did not seem to help. He has not been able to get a respirator at work. 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  Past medical history, social history, family history, medications and allergies are not changed.  Review of systems: 10 systems were reviewed and are significant for no fevers weight loss or night sweats. He has not developed any other neurologic symptoms.  Exam:  Filed Vitals:   10/03/10 1119  BP: 154/78  Pulse: 76   Filed Vitals:   10/03/10 1119  Weight: 184 lb (83.462 kg)   In general he is a well-appearing older known in no acute distress.  Neurologically he is oriented x4 his language is intact extraocular movements are without nystagmus visual fields full to confrontation 3 facial sense muscle facial expression normal. Tongue and palate midline. Shoulder shrug intact. Hearing is intact to conversation bilaterally. Motor exam reveals normal bulk and tone and he has 5 out of 5 muscle strength bilaterally with no pronator drift. Reflexes are 2+ throughout. Coronation reveals normal finger-nose testing. Sensation is intact to light touch throughout. Casual gait and station are normal. Romberg is negative tandem gait is intact.  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 However I'm not convinced that it is occurred in his adult life. 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 He is also noted to have chronic sinusitis. Does not significant  white matter disease.  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  Recommendations: 1. Headaches - I think these headaches are benign. I will get an ESR and CRP although I think a diagnosis of temporal arteritis is very unlikely. I again spoke to Mr. Riedlinger about getting a respirator for work. I think a prophylactic agent as not likely to be very effective. It sounds like however he is probably going to try to find work elsewhere. I do not think this questionable infarct of the right caudate head has anything to do with his headaches. One could consider treating him for chronic sinusitis to see if this helps improve them. 2. 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 However I will get an MRA of his head and neck in order to rule out any stenoses. I also recommended that he begin taking a baby aspirin once a day.  The patient does not need to return to see me unless there is a problem with his MRA. Of course if his headaches continue to give the outside of work he is welcome to make an appointment at any time.  Thank you for having me see this patient in consultation  Lupita Raider. Modesto Charon, MD [facility]rology, [place]

## 2010-10-04 ENCOUNTER — Telehealth: Payer: Self-pay | Admitting: Neurology

## 2010-10-06 ENCOUNTER — Encounter: Payer: Self-pay | Admitting: Neurology

## 2010-10-07 ENCOUNTER — Encounter: Payer: Self-pay | Admitting: Neurology

## 2010-10-07 ENCOUNTER — Telehealth: Payer: Self-pay | Admitting: Neurology

## 2010-10-07 NOTE — Telephone Encounter (Signed)
Letter drafted for [name] to pick up.

## 2010-10-07 NOTE — Telephone Encounter (Signed)
Jeff Ward called again. He is going to be in [place] tomorrow (10/08/2010) and wants to come pick up his worker's comp letter around 9:30 am if possible. He also said he was going to cancel his MRI scheduled for this week.

## 2010-10-07 NOTE — Telephone Encounter (Signed)
Have prepared a letter

## 2010-10-08 ENCOUNTER — Ambulatory Visit (INDEPENDENT_AMBULATORY_CARE_PROVIDER_SITE_OTHER): Payer: 59 | Admitting: Family Medicine

## 2010-10-08 ENCOUNTER — Encounter: Payer: Self-pay | Admitting: Family Medicine

## 2010-10-08 DIAGNOSIS — R51 Headache: Secondary | ICD-10-CM

## 2010-10-08 DIAGNOSIS — I10 Essential (primary) hypertension: Secondary | ICD-10-CM

## 2010-10-08 MED ORDER — FLUTICASONE PROPIONATE 50 MCG/ACT NA SUSP: 1.0000 | Freq: Every day | NASAL | Status: DC

## 2010-10-08 NOTE — Progress Notes (Signed)
Subjective:    Patient ID: Jeff Ward, male    DOB: 02/19/1950, 60 y.o.   MRN: [id number]  HPI  60 yo here for follow up headaches.   Headaches- was exposed to formaldehyde/acetal fumes at very high levels at work. I saw him on 8/24 for this complaint and felt they were consistent with migraines-frontal headache (usually bilateral) or orbital after he has been at work a few hours. Usually associated with photophobia but denies nausea and vomiting. No focal neurological deficits. Gave him rx for as needed Imitrex and discussed decreasing work exposure. Tried Imitrex but felt it did not help.  Referred him to neurology. Dr. Denton Meek saw him on 10/03/2010. Notes reviewed.  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 MRA of head and neck scheduled.  Dr. Modesto Charon agreed that headaches were likely caused by exposure at work and also recommended using a respirator.  Cannot take Imitrex at work. Had no headache over the weekend when he was not working. BP has been intermittently elevated but pt thinks this is due to headaches and headaches not due to elevated BP since he is frequently normotensive when he is not in pain.  Elevated BP- BP Readings from Last 3 Encounters:  10/03/10 154/78  09/25/10 168/82  09/24/10 140/70  Admits to not taking his Lisinopril regularly, often forgets. No noticeable side effects.   Patient Active Problem List  Diagnoses  . COLONIC POLYPS  . HYPERLIPIDEMIA  . ERECTILE DYSFUNCTION  . ESSENTIAL HYPERTENSION, BENIGN  . PUD  . BLOOD IN STOOL  . ARTHRITIS, GENERALIZED  . SHOULDER PAIN, RIGHT  . LEG CRAMPS  . PALPITATIONS  . SNORING  . OTHER NONSPECIFIC ABNORMAL SERUM ENZYME LEVELS  . LIVER FUNCTION TESTS, ABNORMAL  . COMPRESSION FRACTURE, SPINE  . LUMBAR STRAIN  . CARPAL TUNNEL SYNDROME, BILATERAL, HX OF  . BENIGN PROSTATIC HYPERTROPHY, HX OF  . Night sweats  . Degenerative disc  disease, lumbar  . Fever  . Headache   Past Medical History  Diagnosis Date  . 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 Hyperlipidemia 06/2002  . Hypertension    Past Surgical History  Procedure Date  . Carpal tunnel release 2005    and trigger finger  . Tonsillectomy and adenoidectomy    History  Substance Use Topics  . Smoking status: Former Smoker    Types: Cigars    Quit date: 04/02/2010  . Smokeless tobacco: Never Used  . Alcohol Use: 1.2 oz/week    2 Cans of beer per week     2-3 beers a day   Family History  Problem Relation Age of Onset  . Diabetes Mother   . Cancer Father     history of melanona  . Diabetes Sister   . Cancer Paternal Aunt     breast  . Alcohol abuse Maternal Grandfather    Allergies  Allergen Reactions  . Bee   . Sulfonamide Derivatives     REACTION: unconsciousness   Current Outpatient Prescriptions on File Prior to Visit  Medication Sig Dispense Refill  . diclofenac (VOLTAREN) 75 MG EC tablet Take 75 mg by mouth 2 (two) times daily.        Marland Kitchen glucosamine-chondroitin 500-400 MG tablet Take 1 tablet by mouth 2 (two) times daily.        . lansoprazole (PREVACID) 30 MG capsule Take 1 capsule (30 mg total) by  mouth daily.  90 capsule  3  . lisinopril (PRINIVIL,ZESTRIL) 20 MG tablet TAKE 1 TABLET BY MOUTH EVERY DAY  30 tablet  6  . meloxicam (MOBIC) 7.5 MG tablet Take 1 tablet (7.5 mg total) by mouth daily. Take 1-2 once daily by mouth as needed  90 tablet  3  . Multiple Vitamin (MULTIVITAMIN) tablet Take 1 tablet by mouth daily.        . Potassium 99 MG TABS Take 1 tablet by mouth 2 (two) times daily.        . sertraline (ZOLOFT) 50 MG tablet Take 50 mg by mouth daily.        . SUMAtriptan (IMITREX) 50 MG tablet Take 1 tablet (50 mg total) by mouth once as needed for migraine.  30 tablet  2  . testosterone (ANDROGEL) 50 MG/5GM GEL Place 5 g onto the skin daily.  30 Tube  0  . traMADol (ULTRAM) 50 MG tablet Take 1 tablet (50  mg total) by mouth every 6 (six) hours as needed for pain.  120 tablet  3   The PMH, PSH, Social History, Family History, Medications, and allergies have been reviewed in [facility], and have been updated if relevant.  Review of Systems See HPI    Objective:   Physical Exam BP 150/80  Pulse 67  Temp(Src) 98.3 F (36.8 C) (Oral)  Wt 186 lb 8 oz (84.596 kg)  General:  overweght male in NAD Eyes:  PERRL Ears:  External ear exam shows no significant lesions or deformities.  Otoscopic examination reveals clear canals, tympanic membranes are intact bilaterally without bulging, retraction, inflammation or discharge. Hearing is grossly normal bilaterally. Nose:  External nasal examination shows no deformity or inflammation. Nasal mucosa are pink and moist without lesions or exudates. Mouth:  Oral mucosa and oropharynx without lesions or exudates.  Teeth in good repair. Neck:  no carotid bruit or thyromegaly no cervical or supraclavicular lymphadenopathy  Lungs:  Normal respiratory effort, chest expands symmetrically. Lungs are clear to auscultation, no crackles or wheezes. Heart:  Normal rate and regular rhythm. S1 and S2 normal without gallop, murmur, click, rub or other extra sounds. Abdomen:  Bowel sounds positive,abdomen soft and non-tender without masses, organomegaly or hernias noted. Pulses:  R and L posterior tibial pulses are full and equal bilaterally  Extremities:  no edema  Neuro:  CN II-XII intact, strength and reflexes normal bilaterally, normal gait.        Assessment & Plan:   1. Headache   ? 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 Note given for employer.

## 2010-10-09 ENCOUNTER — Other Ambulatory Visit (HOSPITAL_COMMUNITY): Payer: 59

## 2010-11-18 ENCOUNTER — Telehealth: Payer: Self-pay | Admitting: *Deleted

## 2010-11-18 MED ORDER — EPINEPHRINE 0.3 MG/0.3ML IJ DEVI: 0.3000 mg | Freq: Once | INTRAMUSCULAR | Status: DC

## 2010-11-18 NOTE — Telephone Encounter (Signed)
Rx sent 

## 2010-11-18 NOTE — Telephone Encounter (Signed)
Pt is asking for script for epipen to be sent to [facility] in [place].  This is not on med list.

## 2010-12-31 ENCOUNTER — Ambulatory Visit (INDEPENDENT_AMBULATORY_CARE_PROVIDER_SITE_OTHER): Payer: 59 | Admitting: Family Medicine

## 2010-12-31 ENCOUNTER — Encounter: Payer: Self-pay | Admitting: Family Medicine

## 2010-12-31 VITALS — BP 140/70 | HR 60 | Temp 98.6°F | Ht 66.0 in | Wt 191.8 lb

## 2010-12-31 DIAGNOSIS — R0781 Pleurodynia: Secondary | ICD-10-CM

## 2010-12-31 DIAGNOSIS — M79609 Pain in unspecified limb: Secondary | ICD-10-CM

## 2010-12-31 DIAGNOSIS — M79643 Pain in unspecified hand: Secondary | ICD-10-CM

## 2010-12-31 DIAGNOSIS — R079 Chest pain, unspecified: Secondary | ICD-10-CM

## 2010-12-31 DIAGNOSIS — M25529 Pain in unspecified elbow: Secondary | ICD-10-CM

## 2010-12-31 MED ORDER — CEPHALEXIN 500 MG PO CAPS: 500.0000 mg | ORAL_CAPSULE | Freq: Three times a day (TID) | ORAL | Status: AC

## 2010-12-31 MED ORDER — HYDROCODONE-ACETAMINOPHEN 5-500 MG PO TABS: 1.0000 | ORAL_TABLET | Freq: Four times a day (QID) | ORAL | Status: AC | PRN

## 2010-12-31 NOTE — Progress Notes (Signed)
  Patient Name: Jeff Ward Date of Birth: 29-Oct-1950 Age: 60 y.o. Medical Record Number: [id number] Gender: male  History of Present Illness: On extension  Jeff Ward [place] is Ward 60 y.o. very pleasant male patient who presents with the following:  Extension ladder - hit on top of ladder.  R side.  L hand  R elbow.  The patient fell from an extension ladder yesterday. He fell approximately 8 feet and struck his right side. He is now having right-sided chest wall pain. No bruising. No swelling.  He also has Ward small laceration on his elbow. He is Discovered. There is some swelling around this area. Has normal movement of the elbow. There is some swelling noted adjacent to this.  He also struck his left hand, there is some small degree of bruising, but normal motion for him in that hand.  Past Medical History, Surgical History, Social History, Family History, and Problem List have been reviewed in EHR and updated if relevant.  Review of Systems: As above. Significant pain. No cough. No chest pain. He is breathing comfortably and in no distress.  Physical Examination: Filed Vitals:   12/31/10 1228  BP: 140/70  Pulse: 60  Temp: 98.6 F (37 C)  TempSrc: Oral  Height: 5\' 6"  (1.676 m)  Weight: 191 lb 12.8 oz (87 kg)  SpO2: 97%    Body mass index is 30.96 kg/(m^2).   GEN: WDWN, NAD, Non-toxic, Alert & Oriented x 3 HEENT: Atraumatic, Normocephalic.  Ears and Nose: No external deformity. Chest wall: Markedly tender around the patient's 10th rib approximately, and tender with compression in this region on the right side. EXTR: No clubbing/cyanosis/edema NEURO: Normal gait.  PSYCH: Normally interactive. Conversant. Not depressed or anxious appearing.  Calm demeanor.   Right elbow has full range of motion. Strength is intact. There is Ward small puncture wound less than Ward centimeter across. There is also some swelling of the olecranon bursa. In pressing in the adjacent tissue, there is  some fluid that is extruded from the puncture wound.  Hand: Mild bruising on the palmar aspect medially, but negative axial loading. Nontender at the anatomical snuff box, and nontender in all of the bony anatomy.  Assessment and Plan: 1. Rib pain   2. Elbow pain   3. Hand pain     Suspected rib fracture, right side. Give the patient some when necessary Vicodin to take as needed for pain. Counseled him that this will likely take one month at least for him to be asymptomatic.  Right elbow, puncture wound. I am concerned that this communicates with the olecranon bursa. I did put him on prophylactic Keflex to decrease the likelihood for infection. He does do wound care routinely for his wife, and has wound care supplies at home, so updated he is safe to care for his elbow using his home supplies. He was dressed well at the time of his office visit.  Left hand, contusion. Reassurance.

## 2011-01-09 ENCOUNTER — Ambulatory Visit (INDEPENDENT_AMBULATORY_CARE_PROVIDER_SITE_OTHER): Payer: 59 | Admitting: Family Medicine

## 2011-01-09 ENCOUNTER — Encounter: Payer: Self-pay | Admitting: Family Medicine

## 2011-01-09 VITALS — BP 130/80 | HR 65 | Temp 98.6°F | Ht 66.0 in | Wt 186.5 lb

## 2011-01-09 DIAGNOSIS — K625 Hemorrhage of anus and rectum: Secondary | ICD-10-CM

## 2011-01-09 NOTE — Progress Notes (Signed)
Subjective:    Patient ID: Jeff Ward, male    DOB: 09/10/1950, 60 y.o.   MRN: [id number]  HPI  60 yo here for one episode of rectal bleeding.  Has h/o internal hemorrhoids that often bleed. Last colonoscopy 07/2006.  Over the weekend, was lifting heavy things and noticed some bright red blood in his underwear. Bleeding was painless and eventually stopped several minutes later.  He has had multiple bowel movements since this episode, all without blood. No dark stools. No nausea, vomiting, diarrhea, fevers, chills or dizziness.  Patient Active Problem List  Diagnoses  . COLONIC POLYPS  . HYPERLIPIDEMIA  . ERECTILE DYSFUNCTION  . ESSENTIAL HYPERTENSION, BENIGN  . PUD  . BLOOD IN STOOL  . ARTHRITIS, GENERALIZED  . SHOULDER PAIN, RIGHT  . LEG CRAMPS  . PALPITATIONS  . SNORING  . OTHER NONSPECIFIC ABNORMAL SERUM ENZYME LEVELS  . LIVER FUNCTION TESTS, ABNORMAL  . COMPRESSION FRACTURE, SPINE  . LUMBAR STRAIN  . CARPAL TUNNEL SYNDROME, BILATERAL, HX OF  . BENIGN PROSTATIC HYPERTROPHY, HX OF  . Night sweats  . Degenerative disc disease, lumbar  . Fever  . Headache  . Rectal bleeding   Past Medical History  Diagnosis Date  . 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 Hyperlipidemia 06/2002  . Hypertension    Past Surgical History  Procedure Date  . Carpal tunnel release 2005    and trigger finger  . Tonsillectomy and adenoidectomy    History  Substance Use Topics  . Smoking status: Former Smoker    Types: Cigars    Quit date: 04/02/2010  . Smokeless tobacco: Never Used  . Alcohol Use: 1.2 oz/week    2 Cans of beer per week     2-3 beers a day   Family History  Problem Relation Age of Onset  . Diabetes Mother   . Cancer Father     history of melanona  . Diabetes Sister   . Cancer Paternal Aunt     breast  . Alcohol abuse Maternal Grandfather    Allergies  Allergen Reactions  . Bee   . Sulfonamide Derivatives     REACTION:  unconsciousness   Current Outpatient Prescriptions on File Prior to Visit  Medication Sig Dispense Refill  . aspirin 81 MG tablet Take 81 mg by mouth daily.        . cephALEXin (KEFLEX) 500 MG capsule Take 1 capsule (500 mg total) by mouth 3 (three) times daily.  30 capsule  0  . diclofenac (VOLTAREN) 75 MG EC tablet Take 75 mg by mouth 2 (two) times daily.        Marland Kitchen EPINEPHrine (EPI-PEN) 0.3 mg/0.3 mL DEVI Inject 0.3 mLs (0.3 mg total) into the muscle once.  1 Device  0  . fluticasone (FLONASE) 50 MCG/ACT nasal spray Place 1 spray into the nose daily.  16 g  2  . glucosamine-chondroitin 500-400 MG tablet Take 1 tablet by mouth 2 (two) times daily.        Marland Kitchen HYDROcodone-acetaminophen (VICODIN) 5-500 MG per tablet Take 1 tablet by mouth every 6 (six) hours as needed for pain.  50 tablet  0  . lansoprazole (PREVACID) 30 MG capsule Take 1 capsule (30 mg total) by mouth daily.  90 capsule  3  . lisinopril (PRINIVIL,ZESTRIL) 20 MG tablet TAKE 1 TABLET BY MOUTH EVERY DAY  30 tablet  6  . meloxicam (MOBIC) 7.5 MG tablet Take 1 tablet (7.5 mg total)  by mouth daily. Take 1-2 once daily by mouth as needed  90 tablet  3  . Multiple Vitamin (MULTIVITAMIN) tablet Take 1 tablet by mouth daily.        . Potassium 99 MG TABS Take 1 tablet by mouth 2 (two) times daily.        . sertraline (ZOLOFT) 50 MG tablet Take 50 mg by mouth daily.        . SUMAtriptan (IMITREX) 50 MG tablet Take 1 tablet (50 mg total) by mouth once as needed for migraine.  30 tablet  2  . testosterone (ANDROGEL) 50 MG/5GM GEL Place 5 g onto the skin daily.  30 Tube  0  . traMADol (ULTRAM) 50 MG tablet Take 1 tablet (50 mg total) by mouth every 6 (six) hours as needed for pain.  120 tablet  3     Review of Systems    See HPI No rectal pain Objective:   Physical Exam BP 130/80  Pulse 65  Temp(Src) 98.6 F (37 C) (Oral)  Ht 5\' 6"  (1.676 m)  Wt 186 lb 8 oz (84.596 kg)  BMI 30.10 kg/m2 General:  overweght male in NAD Eyes:   PERRL Nose:  External nasal examination shows no deformity or inflammation. Nasal mucosa are pink and moist without lesions or exudates. Mouth:  Oral mucosa and oropharynx without lesions or exudates.  Teeth in good repair. Abdomen:  Bowel sounds positive,abdomen soft and non-tender without masses, organomegaly or hernias noted. Rectal:  Normal tone and sphincter, no external hemorroids, no fissures, Guiac Neg. Pulses:  R and L posterior tibial pulses are full and equal bilaterally  Extremities:  no edema      Assessment & Plan:   1. Rectal bleeding   New- resolved. Likely internal hemorrhoid.  Advised to keep any eye on his symptoms, no signs of thrombosis at this time.  We will have him follow up with GI if symptoms reoccur. The patient indicates understanding of these issues and agrees with the plan.

## 2011-01-09 NOTE — Patient Instructions (Signed)
Good to see you. If your bleeding returns, please let me know immediately. [place]

## 2011-01-14 ENCOUNTER — Other Ambulatory Visit: Payer: Self-pay | Admitting: *Deleted

## 2011-01-14 MED ORDER — SERTRALINE HCL 50 MG PO TABS: 50.0000 mg | ORAL_TABLET | Freq: Every day | ORAL | Status: DC

## 2011-01-14 MED ORDER — MELOXICAM 7.5 MG PO TABS: 7.5000 mg | ORAL_TABLET | Freq: Every day | ORAL | Status: DC

## 2011-01-14 MED ORDER — LANSOPRAZOLE 30 MG PO CPDR: 30.0000 mg | DELAYED_RELEASE_CAPSULE | Freq: Every day | ORAL | Status: DC

## 2011-01-14 MED ORDER — LISINOPRIL 20 MG PO TABS: 20.0000 mg | ORAL_TABLET | Freq: Every day | ORAL | Status: DC

## 2011-03-06 ENCOUNTER — Ambulatory Visit (INDEPENDENT_AMBULATORY_CARE_PROVIDER_SITE_OTHER): Payer: 59 | Admitting: Family Medicine

## 2011-03-06 ENCOUNTER — Encounter: Payer: Self-pay | Admitting: Family Medicine

## 2011-03-06 VITALS — BP 122/80 | HR 67 | Temp 98.3°F | Wt 190.5 lb

## 2011-03-06 DIAGNOSIS — G589 Mononeuropathy, unspecified: Secondary | ICD-10-CM

## 2011-03-06 DIAGNOSIS — G629 Polyneuropathy, unspecified: Secondary | ICD-10-CM

## 2011-03-06 DIAGNOSIS — E349 Endocrine disorder, unspecified: Secondary | ICD-10-CM | POA: Insufficient documentation

## 2011-03-06 DIAGNOSIS — R0609 Other forms of dyspnea: Secondary | ICD-10-CM

## 2011-03-06 DIAGNOSIS — E291 Testicular hypofunction: Secondary | ICD-10-CM

## 2011-03-06 DIAGNOSIS — R0989 Other specified symptoms and signs involving the circulatory and respiratory systems: Secondary | ICD-10-CM

## 2011-03-06 MED ORDER — TESTOSTERONE 50 MG/5GM (1%) TD GEL: 5.0000 g | Freq: Every day | TRANSDERMAL | Status: DC

## 2011-03-06 NOTE — Progress Notes (Signed)
Subjective:    Patient ID: Jeff Ward, male    DOB: 06/04/1950, 61 y.o.   MRN: [id number]  HPI  61 yo here for:  1.  Referral for sleep study- has had issues with snorning for years- wife sleeps in other room.  Not sure if he ever "stopped" breathing in his sleep. Does "toss and turn" a lot. Brother has sleep apnea and his symptoms improved dramatically with CPAP. No CP or SOB.  2.  Follow up low T-  Less night sweats since he started androgel.  Did not get his labs drawn at [facility] (we gave him an rx two months ago).  Would like them drawn today. Does have some increased energy.  3.  Neuropathy- at times ,bottom of feet tingle.  No pain or LE weakness.  Patient Active Problem List  Diagnoses  . COLONIC POLYPS  . HYPERLIPIDEMIA  . ERECTILE DYSFUNCTION  . ESSENTIAL HYPERTENSION, BENIGN  . PUD  . BLOOD IN STOOL  . ARTHRITIS, GENERALIZED  . SHOULDER PAIN, RIGHT  . LEG CRAMPS  . PALPITATIONS  . SNORING  . OTHER NONSPECIFIC ABNORMAL SERUM ENZYME LEVELS  . LIVER FUNCTION TESTS, ABNORMAL  . COMPRESSION FRACTURE, SPINE  . LUMBAR STRAIN  . CARPAL TUNNEL SYNDROME, BILATERAL, HX OF  . BENIGN PROSTATIC HYPERTROPHY, HX OF  . Night sweats  . Degenerative disc disease, lumbar  . Fever  . Headache  . Rectal bleeding  . Low testosterone   Past Medical History  Diagnosis Date  . 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 Hyperlipidemia 06/2002  . Hypertension    Past Surgical History  Procedure Date  . Carpal tunnel release 2005    and trigger finger  . Tonsillectomy and adenoidectomy    History  Substance Use Topics  . Smoking status: Former Smoker    Types: Cigars    Quit date: 04/02/2010  . Smokeless tobacco: Never Used  . Alcohol Use: 1.2 oz/week    2 Cans of beer per week     2-3 beers a day   Family History  Problem Relation Age of Onset  . Diabetes Mother   . Cancer Father     history of melanona  . Diabetes Sister   . Cancer Paternal  Aunt     breast  . Alcohol abuse Maternal Grandfather    Allergies  Allergen Reactions  . Bee   . Sulfonamide Derivatives     REACTION: unconsciousness   Current Outpatient Prescriptions on File Prior to Visit  Medication Sig Dispense Refill  . aspirin 81 MG tablet Take 81 mg by mouth daily.        . diclofenac (VOLTAREN) 75 MG EC tablet Take 75 mg by mouth 2 (two) times daily.        Marland Kitchen EPINEPHrine (EPI-PEN) 0.3 mg/0.3 mL DEVI Inject 0.3 mLs (0.3 mg total) into the muscle once.  1 Device  0  . fluticasone (FLONASE) 50 MCG/ACT nasal spray Place 1 spray into the nose daily.  16 g  2  . glucosamine-chondroitin 500-400 MG tablet Take 1 tablet by mouth 2 (two) times daily.        . lansoprazole (PREVACID) 30 MG capsule Take 1 capsule (30 mg total) by mouth daily.  90 capsule  3  . lisinopril (PRINIVIL,ZESTRIL) 20 MG tablet Take 1 tablet (20 mg total) by mouth daily.  90 tablet  3  . meloxicam (MOBIC) 7.5 MG tablet Take 1 tablet (7.5 mg  total) by mouth daily.  90 tablet  3  . Multiple Vitamin (MULTIVITAMIN) tablet Take 1 tablet by mouth daily.        . Potassium 99 MG TABS Take 1 tablet by mouth 2 (two) times daily.        . sertraline (ZOLOFT) 50 MG tablet Take 1 tablet (50 mg total) by mouth daily.  90 tablet  3  . SUMAtriptan (IMITREX) 50 MG tablet Take 1 tablet (50 mg total) by mouth once as needed for migraine.  30 tablet  2  . traMADol (ULTRAM) 50 MG tablet Take 1 tablet (50 mg total) by mouth every 6 (six) hours as needed for pain.  120 tablet  3     Review of Systems    See HPI No rectal pain Objective:   Physical Exam BP 122/80  Pulse 67  Temp(Src) 98.3 F (36.8 C) (Oral)  Wt 190 lb 8 oz (86.41 kg) General:  overweght male in NAD Eyes:  PERRL Nose:  External nasal examination shows no deformity or inflammation. Nasal mucosa are pink and moist without lesions or exudates. Mouth:  Oral mucosa and oropharynx without lesions or exudates.  Teeth in good repair. Resp:  CTA  bilaterally Abdomen:  Bowel sounds positive,abdomen soft and non-tender without masses, organomegaly or hernias noted. Pulses:  R and L posterior tibial pulses are full and equal bilaterally  Extremities:  no edema, normal sensation     Assessment & Plan:   1. SNORING  Deteriorated.  Will refer to pulm for sleep study. Ambulatory referral to Pulmonology  2. Low testosterone  Recheck labs today. CBC w/Diff, Testosterone, free, total  3. Neuropathy  Etiology unclear.  Check labs to rule out possible contributing factors. B12, Basic Metabolic Panel (BMET)

## 2011-03-06 NOTE — Patient Instructions (Signed)
Good to see you. Please stop by to see Marion on your way out. 

## 2011-03-10 ENCOUNTER — Encounter: Payer: Self-pay | Admitting: Family Medicine

## 2011-03-25 ENCOUNTER — Institutional Professional Consult (permissible substitution): Payer: 59 | Admitting: Pulmonary Disease

## 2011-04-10 ENCOUNTER — Ambulatory Visit (INDEPENDENT_AMBULATORY_CARE_PROVIDER_SITE_OTHER): Payer: 59 | Admitting: Pulmonary Disease

## 2011-04-10 ENCOUNTER — Encounter: Payer: Self-pay | Admitting: Pulmonary Disease

## 2011-04-10 VITALS — BP 148/90 | HR 72 | Temp 98.1°F | Ht 65.0 in | Wt 191.8 lb

## 2011-04-10 DIAGNOSIS — G4733 Obstructive sleep apnea (adult) (pediatric): Secondary | ICD-10-CM

## 2011-04-10 NOTE — Progress Notes (Signed)
  Subjective:    Patient ID: Jeff Ward, male    DOB: January 30, 1950, 61 y.o.   MRN: [id number]  HPI The patient is a 61 year old male who been asked to see for possible obstructive sleep apnea.  The patient has been noted to have loud snoring, and has moved to a different room in order to not disturb his spouse.  She has never mentioned an abnormal breathing pattern during sleep, but again, he sleeps in a different room.  The patient has frequent awakenings at night, and is not rested in the mornings upon arising.  He does have some sleep pressure with activity during the day, but he does not believe it is overly severe.  The patient denies napping during the day.  He states that he does fairly well in the evening watching television until approximately 9 PM, and then he begins to note significant sleepiness.  He denies any sleepiness issues with driving.  Of note, his weight has decreased 20 pounds over the last 2 years, and his Epworth score today is 3.  Sleep Questionnaire: What time do you typically go to bed?( Between what hours) 10 pm How long does it take you to fall asleep? 15 mins How many times during the night do you wake up? 6 What time do you get out of bed to start your day? 0630 Do you drive or operate heavy machinery in your occupation? Yes How much has your weight changed (up or down) over the past two years? (In pounds) 20 lb (9.072 kg) Have you ever had a sleep study before? No Do you currently use CPAP? No Do you wear oxygen at any time? No    Review of Systems  Constitutional: Negative for fever and unexpected weight change.  HENT: Negative for ear pain, nosebleeds, congestion, sore throat, rhinorrhea, sneezing, trouble swallowing, dental problem, postnasal drip and sinus pressure.   Eyes: Negative for redness and itching.  Respiratory: Positive for cough. Negative for chest tightness, shortness of breath and wheezing.   Cardiovascular: Negative for palpitations and leg swelling.    Gastrointestinal: Negative for nausea and vomiting.  Genitourinary: Negative for dysuria.  Musculoskeletal: Positive for joint swelling.  Skin: Negative for rash.  Neurological: Negative for headaches.  Hematological: Does not bruise/bleed easily.  Psychiatric/Behavioral: Negative for dysphoric mood. The patient is not nervous/anxious.        Objective:   Physical Exam Constitutional:  Well developed, no acute distress  HENT:  Nares patent without discharge, large turbinates  Oropharynx without exudate, palate and uvula are thick and elongated.  Eyes:  Perrla, eomi, no scleral icterus  Neck:  No JVD, no TMG  Cardiovascular:  Normal rate, regular rhythm, no rubs or gallops.  No murmurs        Intact distal pulses  Pulmonary :  Normal breath sounds, no stridor or respiratory distress   No rales, rhonchi, or wheezing  Abdominal:  Soft, nondistended, bowel sounds present.  No tenderness noted.   Musculoskeletal:  No lower extremity edema noted.  Lymph Nodes:  No cervical lymphadenopathy noted  Skin:  No cyanosis noted  Neurologic:  Very sleepy, appropriate, moves all 4 extremities without obvious deficit.         Assessment & Plan:

## 2011-04-10 NOTE — Patient Instructions (Signed)
Will setup for home sleep testing to evaluate for sleep apnea.  Will arrange followup once results are available.  Work on weight loss.

## 2011-04-10 NOTE — Assessment & Plan Note (Signed)
The patient's history is very suggestive of sleep disordered breathing.  He has loud snoring, frequent awakenings with nonrestorative sleep, and some sleep pressure during the day.  I have had a long discussion with him about the pathophysiology of sleep apnea, including its impact to his cardiovascular health and quality of life.  I think he needs to have a sleep study done, and given that my suspicion is high and he does not have underlying cardiopulmonary disease, home sleep testing is appropriate.

## 2011-04-22 ENCOUNTER — Telehealth: Payer: Self-pay | Admitting: Pulmonary Disease

## 2011-04-22 NOTE — Telephone Encounter (Signed)
KC, have you seen the results of his Alice? Let me know and I can call him back to schedule ov to discuss, thanks!

## 2011-04-23 ENCOUNTER — Telehealth: Payer: Self-pay | Admitting: Pulmonary Disease

## 2011-04-23 NOTE — Telephone Encounter (Signed)
Pt called back again . Call [phone]. Jeff Ward

## 2011-04-23 NOTE — Telephone Encounter (Signed)
[email or web address].[place] Monds, CMA  

## 2011-04-23 NOTE — Telephone Encounter (Signed)
I have.  It is ok to set up f/u appt to review

## 2011-04-23 NOTE — Telephone Encounter (Signed)
Jeff Ward, [facility] 04/23/2011 10:29 AM Signed  [email or web address]. Jeff Ward, CMA   Barbaraann Share, MD 04/23/2011 8:49 AM Signed  I have. It is ok to set up f/u appt to review  [name], [facility] 04/22/2011 11:24 AM Signed  Jeff Ward, have you seen the results of his [place]? Let me know and I can call him back to schedule ov to discuss, thanks!    Called, spoke with pt.  Advised KC would like to schedule OV to discuss Alice results.  I offered April 11 (first available).  However, pt states he has already been waiting 2 wks to hear something back.  He does not want to wait another 2 wks for OV.  Dr. Annie Main, pls advise if pt can be worked in sooner.  Thank you.

## 2011-04-24 ENCOUNTER — Ambulatory Visit (INDEPENDENT_AMBULATORY_CARE_PROVIDER_SITE_OTHER): Payer: 59 | Admitting: Pulmonary Disease

## 2011-04-24 DIAGNOSIS — G4733 Obstructive sleep apnea (adult) (pediatric): Secondary | ICD-10-CM

## 2011-04-28 ENCOUNTER — Telehealth: Payer: Self-pay | Admitting: Pulmonary Disease

## 2011-04-28 NOTE — Telephone Encounter (Signed)
Will forward to [facility] nurse to schedule follow up appt per triage protocol.

## 2011-04-28 NOTE — Telephone Encounter (Signed)
Pt needs ov to review sleep study results.  

## 2011-04-29 NOTE — Telephone Encounter (Signed)
Spoke with pt and scheduled ov with KC for 05/01/11 at 10 am.

## 2011-04-29 NOTE — Telephone Encounter (Signed)
Patient returning call.  He is upset no one has called back.

## 2011-04-30 NOTE — Telephone Encounter (Signed)
Pt scheduled to see [facility] tomorrow at 10:00

## 2011-05-01 ENCOUNTER — Encounter: Payer: Self-pay | Admitting: Pulmonary Disease

## 2011-05-01 ENCOUNTER — Ambulatory Visit (INDEPENDENT_AMBULATORY_CARE_PROVIDER_SITE_OTHER): Payer: 59 | Admitting: Pulmonary Disease

## 2011-05-01 VITALS — BP 132/70 | HR 76 | Temp 98.4°F | Ht 65.0 in | Wt 193.8 lb

## 2011-05-01 DIAGNOSIS — G4733 Obstructive sleep apnea (adult) (pediatric): Secondary | ICD-10-CM

## 2011-05-01 NOTE — Assessment & Plan Note (Signed)
The patient has mild obstructive sleep apnea by his recent sleep study, and I have discussed with him both conservative and aggressive treatments.  Given his mild severity, this is really not a significant cardiovascular risk for him, and therefore the decision to treat this aggressively should be based upon its impact to his quality of life.  I have reviewed the various treatment options, including a trial of weight loss alone, upper airway surgery, dental appliance, and also CPAP.  The patient for now would like to work on weight loss alone, and will call me if he decides to treat this more aggressively.

## 2011-05-01 NOTE — Patient Instructions (Signed)
Work on weight loss If you decide to treat your sleep apnea more aggressively, consider surgery, dental appliance, cpap.  Please let me know.

## 2011-05-01 NOTE — Progress Notes (Signed)
  Subjective:    Patient ID: Jeff Ward, male    DOB: 27-Mar-1950, 61 y.o.   MRN: [id number]  HPI The patient comes in today for followup after his recent sleep test.  He was found to have an AHI of 14 events per hour, and transient oxygen desaturation.  I have reviewed the study with the patient in detail, and answered all of his questions.   Review of Systems  Constitutional: Negative for fever and unexpected weight change.  HENT: Positive for congestion and postnasal drip. Negative for ear pain, nosebleeds, sore throat, rhinorrhea, sneezing, trouble swallowing, dental problem and sinus pressure.   Eyes: Negative for redness and itching.  Respiratory: Negative for cough, chest tightness, shortness of breath and wheezing.   Cardiovascular: Negative for palpitations and leg swelling.  Gastrointestinal: Negative for nausea and vomiting.  Genitourinary: Negative for dysuria.  Musculoskeletal: Negative for joint swelling.  Skin: Negative for rash.  Neurological: Negative for headaches.  Hematological: Does not bruise/bleed easily.  Psychiatric/Behavioral: Negative for dysphoric mood. The patient is not nervous/anxious.        Objective:   Physical Exam Overweight male in no acute distress Nose without purulent discharge noted Lower extremities without edema, no cyanosis noted Awake, but appears mildly sleepy, moves all 4 extremities.       Assessment & Plan:

## 2011-05-09 ENCOUNTER — Telehealth: Payer: Self-pay | Admitting: *Deleted

## 2011-05-09 NOTE — Telephone Encounter (Signed)
I have attempted to call pt several times throughout the day on his cell phone and his wife's hospital room phone numbers.  There have been no answers on either.  I finally reached his wife in her room, her husband is out but she will ask him to call with [facility] fax number so that I can fax the order and he can get his labs done.

## 2011-05-09 NOTE — Telephone Encounter (Signed)
Pt called back, said he wants to go to [facility] [place] on [address], order faxed there to fax number [phone].

## 2011-05-09 NOTE — Telephone Encounter (Signed)
Pt states his wife has been diagnosed with hep C.  She is in hospital at [facility] and the doctors there want him tested.  He is asking that an order be faxed to [facility] at the hospital.  He says he's going to be there for several more days with his wife, but he wants the order sent in right now.  Please advise.

## 2011-05-09 NOTE — Telephone Encounter (Signed)
Order in my box

## 2011-05-16 NOTE — Telephone Encounter (Signed)
Advised pt of negative results, report sent to be scanned.

## 2011-05-20 ENCOUNTER — Encounter: Payer: Self-pay | Admitting: Family Medicine

## 2011-05-28 ENCOUNTER — Encounter: Payer: Self-pay | Admitting: Gastroenterology

## 2011-06-30 ENCOUNTER — Other Ambulatory Visit (INDEPENDENT_AMBULATORY_CARE_PROVIDER_SITE_OTHER): Payer: 59

## 2011-06-30 ENCOUNTER — Other Ambulatory Visit: Payer: Self-pay | Admitting: Family Medicine

## 2011-06-30 DIAGNOSIS — Z0000 Encounter for general adult medical examination without abnormal findings: Secondary | ICD-10-CM

## 2011-06-30 DIAGNOSIS — I10 Essential (primary) hypertension: Secondary | ICD-10-CM

## 2011-06-30 DIAGNOSIS — E785 Hyperlipidemia, unspecified: Secondary | ICD-10-CM

## 2011-07-01 ENCOUNTER — Other Ambulatory Visit: Payer: Self-pay | Admitting: Family Medicine

## 2011-07-01 LAB — COMPREHENSIVE METABOLIC PANEL
Albumin: 4.2 g/dL (ref 3.6–4.8)
Alkaline Phosphatase: 75 IU/L (ref 25–160)
BUN/Creatinine Ratio: 20 (ref 10–22)
BUN: 19 mg/dL (ref 8–27)
Chloride: 100 mmol/L (ref 97–108)
GFR calc Af Amer: 97 mL/min/{1.73_m2} (ref >59)
Globulin, Total: 2.8 g/dL (ref 1.5–4.5)
Total Bilirubin: 0.5 mg/dL (ref 0.0–1.2)

## 2011-07-01 LAB — CBC WITH DIFFERENTIAL/PLATELET
Basos: 1 % (ref 0–3)
Eos: 6 % (ref 0–7)
Hemoglobin: 14 g/dL (ref 12.6–17.7)
Lymphs: 38 % (ref 14–46)
Monocytes: 11 % (ref 4–13)
Neutrophils Absolute: 1.7 10*3/uL — ABNORMAL LOW (ref 1.8–7.8)
RBC: 4.27 x10E6/uL (ref 4.14–5.80)

## 2011-07-01 LAB — LIPID PANEL
Chol/HDL Ratio: 5.7 ratio units — ABNORMAL HIGH (ref 0.0–5.0)
Cholesterol, Total: 228 mg/dL — ABNORMAL HIGH (ref 100–199)

## 2011-07-01 MED ORDER — FENOFIBRATE 145 MG PO TABS: 145.0000 mg | ORAL_TABLET | Freq: Every day | ORAL | Status: DC

## 2011-07-07 ENCOUNTER — Encounter: Payer: 59 | Admitting: Family Medicine

## 2011-07-10 ENCOUNTER — Other Ambulatory Visit: Payer: Self-pay | Admitting: Family Medicine

## 2011-07-10 MED ORDER — FENOFIBRATE 145 MG PO TABS: 145.0000 mg | ORAL_TABLET | Freq: Every day | ORAL | Status: DC

## 2011-07-10 MED ORDER — TESTOSTERONE 50 MG/5GM (1%) TD GEL: 5.0000 g | Freq: Every day | TRANSDERMAL | Status: DC

## 2011-07-10 NOTE — Telephone Encounter (Signed)
Pt is needing Fenofibrate 145 mg once daily and Androgel 1% 5 grams once daily. He needs a 90 day supply. He uses [facility] : [phone] and Phone: [phone] Pt says thank you and if you need him to come in for more labs just give him a call.

## 2011-07-10 NOTE — Telephone Encounter (Signed)
Fenofibrate sent electronically, androgel faxed.

## 2011-07-10 NOTE — Telephone Encounter (Signed)
Ok to refill as requested 

## 2011-07-11 ENCOUNTER — Other Ambulatory Visit: Payer: Self-pay | Admitting: *Deleted

## 2011-07-11 MED ORDER — LANSOPRAZOLE 30 MG PO CPDR: 30.0000 mg | DELAYED_RELEASE_CAPSULE | Freq: Every day | ORAL | Status: DC

## 2011-07-11 MED ORDER — SERTRALINE HCL 50 MG PO TABS: 50.0000 mg | ORAL_TABLET | Freq: Every day | ORAL | Status: DC

## 2011-07-11 MED ORDER — LISINOPRIL 20 MG PO TABS: 20.0000 mg | ORAL_TABLET | Freq: Every day | ORAL | Status: DC

## 2011-07-15 ENCOUNTER — Other Ambulatory Visit: Payer: Self-pay | Admitting: *Deleted

## 2011-07-15 NOTE — Telephone Encounter (Signed)
Faxed refill request from [facility] for mobic, they are requesting a 90 day supply.  This isnt on med list.

## 2011-07-16 MED ORDER — MELOXICAM 7.5 MG PO TABS: 7.5000 mg | ORAL_TABLET | Freq: Every day | ORAL | Status: DC

## 2011-07-16 NOTE — Telephone Encounter (Signed)
Rx sent but he should NOT be taking this with diclofenac. I will d/c diclofenac from his med list.

## 2011-07-16 NOTE — Telephone Encounter (Signed)
Advised pt

## 2011-07-23 ENCOUNTER — Ambulatory Visit (INDEPENDENT_AMBULATORY_CARE_PROVIDER_SITE_OTHER): Payer: 59 | Admitting: Family Medicine

## 2011-07-23 ENCOUNTER — Encounter: Payer: Self-pay | Admitting: Family Medicine

## 2011-07-23 VITALS — BP 150/80 | HR 78 | Temp 98.0°F | Ht 65.5 in | Wt 198.0 lb

## 2011-07-23 DIAGNOSIS — I10 Essential (primary) hypertension: Secondary | ICD-10-CM

## 2011-07-23 DIAGNOSIS — E785 Hyperlipidemia, unspecified: Secondary | ICD-10-CM

## 2011-07-23 DIAGNOSIS — H6120 Impacted cerumen, unspecified ear: Secondary | ICD-10-CM

## 2011-07-23 DIAGNOSIS — R7309 Other abnormal glucose: Secondary | ICD-10-CM

## 2011-07-23 DIAGNOSIS — R739 Hyperglycemia, unspecified: Secondary | ICD-10-CM | POA: Insufficient documentation

## 2011-07-23 DIAGNOSIS — G4733 Obstructive sleep apnea (adult) (pediatric): Secondary | ICD-10-CM

## 2011-07-23 DIAGNOSIS — Z0000 Encounter for general adult medical examination without abnormal findings: Secondary | ICD-10-CM

## 2011-07-23 NOTE — Progress Notes (Signed)
  Subjective:    Patient ID: Jeff Ward, male    DOB: 01-14-1951, 61 y.o.   MRN: [id number]  HPI  61 yo originally here for CPX but fasting labs remarkably abnormal- here to discuss.  Hyperglycemia- CBG this month was 133 ( up from 96 10 months ago). Pt insists this was a fasting lab. Denies family h/o DM. Denies increased thirst and or urination. Has gained weight- wife is very ill with rare vasculitis. Wt Readings from Last 3 Encounters:  07/23/11 198 lb (89.812 kg)  05/01/11 193 lb 12.8 oz (87.907 kg)  04/10/11 191 lb 12.8 oz (87 kg)     HLD- TG extremely elevated, unable to calculate LDL.  Started Tricor 145 mg 2 weeks ago.  He has been drinking more alcohol and diet has not been as well controlled. Denies any abdominal pain, nausea or vomiting. Lab Results  Component Value Date   HDL 40 06/30/2011   LDLCALC Comment 06/30/2011   TRIG 530* 06/30/2011   CHOLHDL 5.7* 06/30/2011    Elevated LFTs- Lab Results  Component Value Date   ALT 76* 06/30/2011   AST 74* 06/30/2011   ALKPHOS 75 06/30/2011   BILITOT 0.5 06/30/2011   Cerumen impaction left- left ear feels full and itchy.  No drainage.  No pain. Review of Systems See HPI Endorses malaise No SOB or CP    Objective:   Physical Exam BP 150/80  Pulse 78  Temp 98 F (36.7 C)  Ht 5' 5.5" (1.664 m)  Wt 198 lb (89.812 kg)  BMI 32.45 kg/m2 General:  overweght male in NAD Eyes:  PERRL Ears:  External ear exam shows no significant lesions or deformities.   Left ear: Ceruminosis is noted.  Nose:  External nasal examination shows no deformity or inflammation. Nasal mucosa are pink and moist without lesions or exudates. Mouth:  Oral mucosa and oropharynx without lesions or exudates.  Teeth in good repair. Neck:  no carotid bruit or thyromegaly no cervical or supraclavicular lymphadenopathy  Lungs:  Normal respiratory effort, chest expands symmetrically. Lungs are clear to auscultation, no crackles or wheezes. Heart:  Normal rate  and regular rhythm. S1 and S2 normal without gallop, murmur, click, rub or other extra sounds. Abdomen:  Bowel sounds positive,abdomen soft and non-tender without masses, organomegaly or hernias noted. Extremities:  no edema     Assessment & Plan:   1. HYPERLIPIDEMIA  New. Extremely elevated TG- started Tricor. Discussed cutting back on ETOH and sugar- see pt instructions for details. Follow up in 3 weeks. Lipid Panel  2. elevated LFTs- New. Likely related to #1 and ETOH use.    3. Hyperglycemia  New. If fasting, above 126, so would be consistent with DM.  Will check a1c today to confirm since TG also elevated. If a1c confirms diabetes diagnosis, will start Metformin 500 mg twice daily and refer for diabetes education. The patient indicates understanding of these issues and agrees with the plan.  Hemoglobin A1c, Comprehensive metabolic panel   4.  Cerumen impaction- Ceruminosis is noted.  Wax is removed by syringing and manual debridement. Instructions for home care to prevent wax buildup are given.

## 2011-07-23 NOTE — Patient Instructions (Addendum)
Today we are checking labs to verify diabetes- Triglycerides are too high.  Weight loss (even small amount) can decrease triglycerides.  Decrease added sugars, eliminate trans fats, increase fiber and limit alcohol.  All these changes together can drop triglycerides by almost 50%. I want you to come to see me in 3 weeks.

## 2011-07-24 LAB — COMPREHENSIVE METABOLIC PANEL
ALT: 86 IU/L — ABNORMAL HIGH (ref 0–44)
AST: 100 IU/L — ABNORMAL HIGH (ref 0–40)
Albumin/Globulin Ratio: 1.7 (ref 1.1–2.5)
Alkaline Phosphatase: 51 IU/L (ref 25–160)
BUN/Creatinine Ratio: 14 (ref 10–22)
Calcium: 9 mg/dL (ref 8.6–10.2)
Creatinine, Ser: 1.19 mg/dL (ref 0.76–1.27)
GFR calc Af Amer: 76 mL/min/{1.73_m2} (ref >59)
GFR calc non Af Amer: 66 mL/min/{1.73_m2} (ref >59)
Globulin, Total: 2.6 g/dL (ref 1.5–4.5)
Potassium: 5.4 mmol/L — ABNORMAL HIGH (ref 3.5–5.2)
Sodium: 141 mmol/L (ref 134–144)
Total Bilirubin: 0.4 mg/dL (ref 0.0–1.2)

## 2011-07-24 LAB — HEMOGLOBIN A1C
Est. average glucose Bld gHb Est-mCnc: 114 mg/dL
Hgb A1c MFr Bld: 5.6 % (ref 4.8–5.6)

## 2011-07-24 LAB — LIPID PANEL
Cholesterol, Total: 229 mg/dL — ABNORMAL HIGH (ref 100–199)
HDL: 35 mg/dL — ABNORMAL LOW (ref >39)
Triglycerides: 256 mg/dL — ABNORMAL HIGH (ref 0–149)

## 2011-07-28 ENCOUNTER — Telehealth: Payer: Self-pay | Admitting: *Deleted

## 2011-07-28 MED ORDER — MELOXICAM 7.5 MG PO TABS: 7.5000 mg | ORAL_TABLET | Freq: Two times a day (BID) | ORAL | Status: DC

## 2011-07-28 NOTE — Telephone Encounter (Signed)
Patient called stating that he is having side effects from Fenofibrate. Patient complaints of stomach pain, bloating and diarrhea. Patient also stated that his Meloxicam was sent to [facility] showing directions of one daily and he takes it two times a day. Patient requested that a script be sent to the pharmacy showing the correct directions.

## 2011-07-28 NOTE — Telephone Encounter (Signed)
Patient notified. He said he would stick with the fenofibrate for now and see if his body gets adjusted to it.

## 2011-07-28 NOTE — Telephone Encounter (Signed)
I have sent corrected rx to [facility] (meloxicam). In terms of fenofibrate, all of the fibrates have similar side effect profiles but they lower triglyerides with most. We could try niacin and fish oil but niacin does cause flushing. Would he be ok with trying the two in combination- niacin and fish oil?  If so, I will send rxs to his local pharmacy ( please verify pharmacy).

## 2011-08-13 ENCOUNTER — Encounter: Payer: Self-pay | Admitting: Family Medicine

## 2011-08-13 ENCOUNTER — Ambulatory Visit (INDEPENDENT_AMBULATORY_CARE_PROVIDER_SITE_OTHER): Payer: 59 | Admitting: Family Medicine

## 2011-08-13 ENCOUNTER — Ambulatory Visit: Payer: 59 | Admitting: Family Medicine

## 2011-08-13 VITALS — BP 128/70 | HR 60 | Temp 97.7°F | Wt 196.0 lb

## 2011-08-13 DIAGNOSIS — R7309 Other abnormal glucose: Secondary | ICD-10-CM

## 2011-08-13 DIAGNOSIS — E781 Pure hyperglyceridemia: Secondary | ICD-10-CM | POA: Insufficient documentation

## 2011-08-13 DIAGNOSIS — R739 Hyperglycemia, unspecified: Secondary | ICD-10-CM

## 2011-08-13 NOTE — Progress Notes (Signed)
  Subjective:    Patient ID: Jeff Ward, male    DOB: 1950-02-09, 61 y.o.   MRN: [id number]  HPI  61 yo originally here for follow up.  Hyperglycemia- CBG last month was 133 ( up from 96 10 months ago). Pt insists this was a fasting lab but a1c was normal. Denies family h/o DM. Denies increased thirst and or urination. Has gained weight- wife is very ill with rare vasculitis.  Lab Results  Component Value Date   HGBA1C 5.6 07/23/2011   Wt Readings from Last 3 Encounters:  08/13/11 196 lb (88.905 kg)  07/23/11 198 lb (89.812 kg)  05/01/11 193 lb 12.8 oz (87.907 kg)       HLD- TG extremely elevated, unable to calculate LDL.  Started Tricor 145 mg.  Initially had stomach bloating that has now resolved. Denies any abdominal pain, nausea or vomiting. He has cut back on alcohol consumption, cutting back on sugar intake as well. Lab Results  Component Value Date   HDL 35* 07/23/2011   LDLCALC 143* 07/23/2011   TRIG 256* 07/23/2011   CHOLHDL 6.5* 07/23/2011    Elevated LFTs- Lab Results  Component Value Date   ALT 86* 07/23/2011   AST 100* 07/23/2011   ALKPHOS 51 07/23/2011   BILITOT 0.4 07/23/2011   ROS: See HPI    Objective:   Physical Exam BP 128/70  Pulse 60  Temp 97.7 F (36.5 C)  Wt 196 lb (88.905 kg) Wt Readings from Last 3 Encounters:  08/13/11 196 lb (88.905 kg)  07/23/11 198 lb (89.812 kg)  05/01/11 193 lb 12.8 oz (87.907 kg)    General:  overweght male in NAD Eyes:  PERRL Ears:  External ear exam shows no significant lesions or deformities.   Left ear: Ceruminosis is noted.  Nose:  External nasal examination shows no deformity or inflammation. Nasal mucosa are pink and moist without lesions or exudates. Mouth:  Oral mucosa and oropharynx without lesions or exudates.  Teeth in good repair. Neck:  no carotid bruit or thyromegaly no cervical or supraclavicular lymphadenopathy  Lungs:  Normal respiratory effort, chest expands symmetrically. Lungs are  clear to auscultation, no crackles or wheezes. Heart:  Normal rate and regular rhythm. S1 and S2 normal without gallop, murmur, click, rub or other extra sounds. Abdomen:  Bowel sounds positive,abdomen soft and non-tender without masses, organomegaly or hernias noted. Extremities:  no edema     Assessment & Plan:    1. Hyperglycemia  Likely due to hypertriglyceridemia. Recheck labs today. Refer to nutrition. Comprehensive metabolic panel, Hemoglobin A1c, Amb ref to Medical Nutrition Therapy-[facility]  2. Hypertriglyceridemia  Recheck labs, continue tricor. Lipid Panel, Amb ref to Medical Nutrition Therapy-[facility]

## 2011-08-13 NOTE — Patient Instructions (Addendum)
Good to see you. We will call you with your lab results.   I hope your wife has a speedy recovery.

## 2011-08-13 NOTE — Addendum Note (Signed)
Addended by: Alvina Chou on: 08/13/2011 10:41 AM   Modules accepted: Orders

## 2011-08-14 LAB — COMPREHENSIVE METABOLIC PANEL
ALT: 74 IU/L — ABNORMAL HIGH (ref 0–44)
AST: 73 IU/L — ABNORMAL HIGH (ref 0–40)
Alkaline Phosphatase: 42 IU/L — ABNORMAL LOW (ref 44–103)
BUN/Creatinine Ratio: 20 (ref 10–22)
CO2: 24 mmol/L (ref 19–28)
Creatinine, Ser: 1.22 mg/dL (ref 0.76–1.27)
Globulin, Total: 2.7 g/dL (ref 1.5–4.5)
Potassium: 4.9 mmol/L (ref 3.5–5.2)
Sodium: 137 mmol/L (ref 134–144)

## 2011-08-14 LAB — HEMOGLOBIN A1C: Hgb A1c MFr Bld: 5.8 % — ABNORMAL HIGH (ref 4.8–5.6)

## 2011-08-14 LAB — LIPID PANEL
Chol/HDL Ratio: 5.8 ratio units — ABNORMAL HIGH (ref 0.0–5.0)
Triglycerides: 155 mg/dL — ABNORMAL HIGH (ref 0–149)

## 2011-08-19 ENCOUNTER — Ambulatory Visit: Payer: Self-pay | Admitting: Family Medicine

## 2011-08-26 ENCOUNTER — Ambulatory Visit (INDEPENDENT_AMBULATORY_CARE_PROVIDER_SITE_OTHER): Payer: 59 | Admitting: Family Medicine

## 2011-08-26 ENCOUNTER — Encounter: Payer: Self-pay | Admitting: Family Medicine

## 2011-08-26 VITALS — BP 110/80 | HR 64 | Temp 98.1°F | Wt 197.0 lb

## 2011-08-26 DIAGNOSIS — R21 Rash and other nonspecific skin eruption: Secondary | ICD-10-CM

## 2011-08-26 NOTE — Patient Instructions (Addendum)
No worrisome red flags. Elevate foot above heart. Tylenol for pain. Follow up if not improving. Call if new symptoms or increase in pain.

## 2011-08-26 NOTE — Progress Notes (Signed)
  Subjective:    Patient ID: Jeff Ward, male    DOB: 19-Oct-1950, 62 y.o.   MRN: [id number]  HPI  61 year old male pt of Dr.Aron's presents with red spot tender on lateral left foot, first noted yesterday. Today he noted darkening of skin over lateral foot, bruise like appearance. Mild pain He has no known injury.  Does have small healing bug bite on dorsal foot from 1 week ago. He had bug bites all over legs.. None infected looking.  Fenofibrate new in last 5-6 weeks, no DM.  No headache, no neck pain. No fever. No flu like symtpoms.     Review of Systems  Constitutional: Negative for fever and fatigue.  HENT: Negative for ear pain.   Eyes: Negative for pain.  Respiratory: Negative for shortness of breath.   Cardiovascular: Negative for chest pain.       Objective:   Physical Exam  Constitutional: He is oriented to person, place, and time. He appears well-developed and well-nourished.  HENT:  Head: Normocephalic.  Eyes: Pupils are equal, round, and reactive to light.  Cardiovascular: Normal rate and regular rhythm.   Pulmonary/Chest: Effort normal and breath sounds normal.  Neurological: He is oriented to person, place, and time.  Skin:       Small 1 cm erythematous lesion on lateral foot, mild edema in foot, dependant bruising on lateral foot.  B varicose veins          Assessment & Plan:

## 2011-08-26 NOTE — Assessment & Plan Note (Signed)
Likely due to peirpheral swelling, possible bug bite and associated inflammatory reaction. No clear infection.   Elevate, analgesic and time.

## 2011-08-27 ENCOUNTER — Ambulatory Visit: Payer: 59 | Admitting: Family Medicine

## 2011-08-28 ENCOUNTER — Ambulatory Visit: Payer: Self-pay | Admitting: Family Medicine

## 2011-09-28 ENCOUNTER — Ambulatory Visit: Payer: Self-pay | Admitting: Family Medicine

## 2011-10-14 ENCOUNTER — Other Ambulatory Visit: Payer: Self-pay | Admitting: *Deleted

## 2011-10-14 MED ORDER — MELOXICAM 7.5 MG PO TABS: 7.5000 mg | ORAL_TABLET | Freq: Two times a day (BID) | ORAL | Status: DC

## 2011-10-14 MED ORDER — SERTRALINE HCL 50 MG PO TABS: 50.0000 mg | ORAL_TABLET | Freq: Every day | ORAL | Status: DC

## 2011-10-14 MED ORDER — LANSOPRAZOLE 30 MG PO CPDR: 30.0000 mg | DELAYED_RELEASE_CAPSULE | Freq: Every day | ORAL | Status: DC

## 2011-10-14 MED ORDER — FENOFIBRATE 145 MG PO TABS: 145.0000 mg | ORAL_TABLET | Freq: Every day | ORAL | Status: DC

## 2011-10-14 NOTE — Telephone Encounter (Signed)
Pt has brought in a free try voucher for cialis- # 30 of 2.5 or 5 mg's or # 3 of 10 or 20 mg's.  [facility] [place].  Pt takes 20 mg's, according to chart, but that would only give him 3 free pills.  Please advise.

## 2011-10-15 MED ORDER — TADALAFIL 20 MG PO TABS: 20.0000 mg | ORAL_TABLET | ORAL | Status: DC | PRN

## 2011-10-28 ENCOUNTER — Telehealth: Payer: Self-pay | Admitting: *Deleted

## 2011-10-28 MED ORDER — TADALAFIL 20 MG PO TABS: 20.0000 mg | ORAL_TABLET | ORAL | Status: DC | PRN

## 2011-10-28 MED ORDER — TADALAFIL 2.5 MG PO TABS: ORAL_TABLET | ORAL | Status: DC

## 2011-10-28 NOTE — Telephone Encounter (Signed)
Rx sent to [facility] as requested  

## 2011-10-28 NOTE — Telephone Encounter (Signed)
Pt is asking for a daily dose, 20 mg's were sent in.  Can this be changed to 2.5 or 5 mg's.  20 mg script cancelled at pharmacy.

## 2011-10-28 NOTE — Telephone Encounter (Signed)
I apologize.  Yes please change to 2.5 mg daily. Thank you.

## 2011-10-28 NOTE — Telephone Encounter (Signed)
Pt wants to change his cialis script to a daily dose strength, so that he can use a free 30 day voucher that I am mailing to him.  He says he had been on a daily dose before, but his insurance company wouldn't pay for it.

## 2011-10-28 NOTE — Telephone Encounter (Signed)
Script sent to pharmacy, free trial card mailed to patient.

## 2011-11-07 ENCOUNTER — Telehealth: Payer: Self-pay | Admitting: Family Medicine

## 2011-11-07 NOTE — Telephone Encounter (Signed)
Caller: [name]/Patient; Patient Name: Jeff Ward; PCP: Ruthe Mannan [facility]); Best Callback Phone Number: [phone] Patient is calling as a follow up from previous call 11/07/11.   Patient left message for office, office called patient back but patient missed the call.   Advised patient that this nurse is not able to find information in EPIC regarding phone call from office 11/07/11, advised patient to call office 11/10/11.

## 2011-11-14 ENCOUNTER — Telehealth: Payer: Self-pay | Admitting: Family Medicine

## 2011-11-14 DIAGNOSIS — E785 Hyperlipidemia, unspecified: Secondary | ICD-10-CM

## 2011-11-14 DIAGNOSIS — R739 Hyperglycemia, unspecified: Secondary | ICD-10-CM

## 2011-11-14 NOTE — Telephone Encounter (Signed)
Yes ok to check a1c for hyperglycemia and lipid panel.   Orders entered.

## 2011-11-14 NOTE — Telephone Encounter (Signed)
Pt is needing lipid panel. He said ya'll talked about another lab visit to check everything. I put him on for next Tuesday. But there is no order in. I wasn't sure if you wanted to do these or not.

## 2011-11-18 ENCOUNTER — Other Ambulatory Visit (INDEPENDENT_AMBULATORY_CARE_PROVIDER_SITE_OTHER): Payer: 59

## 2011-11-18 DIAGNOSIS — R7309 Other abnormal glucose: Secondary | ICD-10-CM

## 2011-11-18 DIAGNOSIS — R739 Hyperglycemia, unspecified: Secondary | ICD-10-CM

## 2011-11-18 DIAGNOSIS — E785 Hyperlipidemia, unspecified: Secondary | ICD-10-CM

## 2011-11-19 LAB — HEMOGLOBIN A1C
Est. average glucose Bld gHb Est-mCnc: 126 mg/dL
Hgb A1c MFr Bld: 6 % — ABNORMAL HIGH (ref 4.8–5.6)

## 2011-11-19 LAB — LIPID PANEL
Chol/HDL Ratio: 4 ratio units (ref 0.0–5.0)
HDL: 48 mg/dL (ref >39)
Triglycerides: 114 mg/dL (ref 0–149)
VLDL Cholesterol Cal: 23 mg/dL (ref 5–40)

## 2011-11-20 ENCOUNTER — Ambulatory Visit (INDEPENDENT_AMBULATORY_CARE_PROVIDER_SITE_OTHER): Payer: 59 | Admitting: Family Medicine

## 2011-11-20 ENCOUNTER — Encounter: Payer: Self-pay | Admitting: Family Medicine

## 2011-11-20 VITALS — BP 160/80 | HR 80 | Temp 98.1°F | Wt 203.0 lb

## 2011-11-20 DIAGNOSIS — E781 Pure hyperglyceridemia: Secondary | ICD-10-CM

## 2011-11-20 DIAGNOSIS — R739 Hyperglycemia, unspecified: Secondary | ICD-10-CM

## 2011-11-20 DIAGNOSIS — R7309 Other abnormal glucose: Secondary | ICD-10-CM

## 2011-11-20 DIAGNOSIS — Z23 Encounter for immunization: Secondary | ICD-10-CM

## 2011-11-20 NOTE — Progress Notes (Signed)
  Subjective:    Patient ID: Jeff Ward, male    DOB: 1950/12/16, 61 y.o.   MRN: [id number]  HPI  61 yo originally here for follow up.  BP elevated but has not taken his medication yet this morning.  BP typically under great control. Denies any HA, blurred vision, CP or SOB.  BP Readings from Last 3 Encounters:  11/20/11 160/80  08/26/11 110/80  08/13/11 128/70    Hyperglycemia- a1c increased a little this month. Referred to nutritionist in July whom he did see. Denies family h/o DM.Marland Kitchen Denies increased thirst and or urination.  He reports that he is cutting back on starches.  TG also much better!  Lab Results  Component Value Date   HGBA1C 6.0* 11/18/2011      HLD- TG improved, were extremely elevated.   Started Tricor 145 mg.  Initially had stomach bloating that has now resolved. Denies any abdominal pain, nausea or vomiting. He has cut back on alcohol consumption, cutting back on sugar intake as well. Lab Results  Component Value Date   HDL 48 11/18/2011   LDLCALC 120* 11/18/2011   TRIG 114 11/18/2011   CHOLHDL 4.0 11/18/2011     ROS: See HPI    Objective:   Physical Exam  BP 160/80  Pulse 80  Temp 98.1 F (36.7 C)  Wt 203 lb (92.08 kg) BP Readings from Last 3 Encounters:  11/20/11 160/80  08/26/11 110/80  08/13/11 128/70     General:  overweght male in NAD Eyes:  PERRL Nose:  External nasal examination shows no deformity or inflammation. Nasal mucosa are pink and moist without lesions or exudates. Mouth:  Oral mucosa and oropharynx without lesions or exudates.  Teeth in good repair. Neck:  no carotid bruit or thyromegaly no cervical or supraclavicular lymphadenopathy  Lungs:  Normal respiratory effort, chest expands symmetrically. Lungs are clear to auscultation, no crackles or wheezes. Heart:  Normal rate and regular rhythm. S1 and S2 normal without gallop, murmur, click, rub or other extra sounds. Abdomen:  Bowel sounds positive,abdomen soft  and non-tender without masses, organomegaly or hernias noted. Extremities:  no edema     Assessment & Plan:   1. Hyperglycemia  a1c remains elevated but not yet diabetic.  Continue dietary changes as discussed. Recheck a1c in 6 months.  2. Hypertriglyceridemia  Much improved on Tricor.  Continue current dose.

## 2011-11-20 NOTE — Addendum Note (Signed)
Addended by: Eliezer Bottom on: 11/20/2011 10:30 AM   Modules accepted: Orders

## 2011-11-20 NOTE — Patient Instructions (Addendum)
Good to see you. Great job with your triglycerides.  Please come back in 6 months to recheck your a1c.

## 2011-12-18 ENCOUNTER — Other Ambulatory Visit: Payer: Self-pay | Admitting: *Deleted

## 2011-12-18 MED ORDER — FLUTICASONE PROPIONATE 50 MCG/ACT NA SUSP: 1.0000 | Freq: Every day | NASAL | Status: DC

## 2011-12-18 NOTE — Telephone Encounter (Signed)
Was talking to pt regarding his wife's med. (Dr. Milinda Antis pt) and pt wanted me to ask you to refill his med

## 2012-03-24 ENCOUNTER — Ambulatory Visit: Payer: 59 | Admitting: Family Medicine

## 2012-03-25 ENCOUNTER — Ambulatory Visit (INDEPENDENT_AMBULATORY_CARE_PROVIDER_SITE_OTHER)
Admission: RE | Admit: 2012-03-25 | Discharge: 2012-03-25 | Disposition: A | Discharge status: Home / Self Care | Payer: 59 | Source: Ambulatory Visit | Attending: Family Medicine | Admitting: Family Medicine

## 2012-03-25 ENCOUNTER — Encounter: Payer: Self-pay | Admitting: Family Medicine

## 2012-03-25 ENCOUNTER — Ambulatory Visit (INDEPENDENT_AMBULATORY_CARE_PROVIDER_SITE_OTHER): Payer: 59 | Admitting: Family Medicine

## 2012-03-25 ENCOUNTER — Telehealth: Payer: Self-pay | Admitting: *Deleted

## 2012-03-25 VITALS — BP 138/68 | HR 68 | Temp 98.1°F | Wt 196.0 lb

## 2012-03-25 DIAGNOSIS — S43421A Sprain of right rotator cuff capsule, initial encounter: Secondary | ICD-10-CM

## 2012-03-25 DIAGNOSIS — S43429A Sprain of unspecified rotator cuff capsule, initial encounter: Secondary | ICD-10-CM

## 2012-03-25 MED ORDER — LISINOPRIL 20 MG PO TABS: 20.0000 mg | ORAL_TABLET | Freq: Every day | ORAL | Status: DC

## 2012-03-25 MED ORDER — SERTRALINE HCL 50 MG PO TABS: 50.0000 mg | ORAL_TABLET | Freq: Every day | ORAL | Status: DC

## 2012-03-25 MED ORDER — LANSOPRAZOLE 30 MG PO CPDR: 30.0000 mg | DELAYED_RELEASE_CAPSULE | Freq: Every day | ORAL | Status: DC

## 2012-03-25 MED ORDER — FENOFIBRATE 145 MG PO TABS: 145.0000 mg | ORAL_TABLET | Freq: Every day | ORAL | Status: DC

## 2012-03-25 NOTE — Telephone Encounter (Signed)
Pt requests new prescriptions be sent to [facility].  OK to refill cialis and diclofenac with 90 day supplies?

## 2012-03-25 NOTE — Progress Notes (Signed)
SUBJECTIVE: Jeff Ward is a 62 y.o. male who complaints of right shoulder for 1 1/2 months.  No known injury.  Pain is getting significantly worse.  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  Patient Active Problem List  Diagnosis  . COLONIC POLYPS  . HYPERLIPIDEMIA  . ESSENTIAL HYPERTENSION, BENIGN  . PUD  . BLOOD IN STOOL  . ARTHRITIS, GENERALIZED  . SHOULDER PAIN, RIGHT  . LEG CRAMPS  . PALPITATIONS  . OTHER NONSPECIFIC ABNORMAL SERUM ENZYME LEVELS  . LIVER FUNCTION TESTS, ABNORMAL  . LUMBAR STRAIN  . CARPAL TUNNEL SYNDROME, BILATERAL, HX OF  . BENIGN PROSTATIC HYPERTROPHY, HX OF  . Night sweats  . Degenerative disc disease, lumbar  . Rectal bleeding  . Low testosterone  . OSA (obstructive sleep apnea)  . Hyperglycemia   Past Medical History  Diagnosis Date  . 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 Hyperlipidemia 06/2002  . Hypertension    Past Surgical History  Procedure Laterality Date  . Carpal tunnel release  2005    and trigger finger  . Tonsillectomy and adenoidectomy     History  Substance Use Topics  . Smoking status: Former Smoker -- 35 years    Types: Cigars    Quit date: 04/02/2010  . Smokeless tobacco: Never Used     Comment: smoked cigars 1 to 2 daily  . Alcohol Use: 1.2 oz/week    2 Cans of beer per week     Comment: 2-3 beers a day   Family History  Problem Relation Age of Onset  . Diabetes Mother   . Cancer Father     history of melanona  . Diabetes Sister   . Cancer Paternal Aunt     breast  . Alcohol abuse Maternal Grandfather    Allergies  Allergen Reactions  . Nutritional Supplements   . Sulfonamide Derivatives     REACTION: unconsciousness   Current Outpatient Prescriptions on File Prior to Visit  Medication Sig Dispense Refill  . EPINEPHrine (EPI-PEN) 0.3 mg/0.3 mL DEVI Inject 0.3 mLs (0.3 mg total) into the muscle once.  1 Device  0  . fenofibrate (TRICOR) 145 MG tablet Take 1 tablet (145 mg  total) by mouth daily.  90 tablet  3  . fluticasone (FLONASE) 50 MCG/ACT nasal spray Place 1 spray into the nose daily.  16 g  0  . glucosamine-chondroitin 500-400 MG tablet Take 1 tablet by mouth 2 (two) times daily.        . lansoprazole (PREVACID) 30 MG capsule Take 1 capsule (30 mg total) by mouth daily.  90 capsule  3  . lisinopril (PRINIVIL,ZESTRIL) 20 MG tablet Take 1 tablet (20 mg total) by mouth daily.  90 tablet  0  . meloxicam (MOBIC) 7.5 MG tablet Take 1 tablet (7.5 mg total) by mouth 2 (two) times daily.  180 tablet  0  . Multiple Vitamin (MULTIVITAMIN) tablet Take 1 tablet by mouth daily.        . Potassium 99 MG TABS Take 1 tablet by mouth 2 (two) times daily.        . sertraline (ZOLOFT) 50 MG tablet Take 1 tablet (50 mg total) by mouth daily.  90 tablet  3  . SUMAtriptan (IMITREX) 50 MG tablet Take 50 mg by mouth once as needed.      . Tadalafil (CIALIS) 2.5 MG TABS Take one by mouth daily  30 each  3  .  testosterone (ANDROGEL) 50 MG/5GM GEL Place 5 g onto the skin daily.  3 Tube  0   No current facility-administered medications on file prior to visit.   The PMH, PSH, Social History, Family History, Medications, and allergies have been reviewed in [facility], and have been updated if relevant.    OBJECTIVE: BP 138/68  Pulse 68  Temp(Src) 98.1 F (36.7 C)  Wt 196 lb (88.905 kg)  BMI 32.11 kg/m2  Appearance: alert, well appearing, and in no distress. Shoulder exam(right): No swelling or joint tenderness Pos arch sign, pos empty can, pos impingement X-ray: ordered, but results not yet available.  ASSESSMENT: Shoulder Rotator Cuff Tendinitis- I doubt tear  PLAN: rest the injured area as much as practical, X-Ray ordered, referral to Physical Therapy See orders for this visit as documented in the electronic medical record.

## 2012-03-25 NOTE — Patient Instructions (Addendum)
Good to see you. We are getting an xray today- we will call you with those results.  Stop by to see Shirlee Limerick on your way out to set up your physical therapy appointment.

## 2012-03-25 NOTE — Telephone Encounter (Signed)
Yes ok to refill as pt requests.

## 2012-03-26 MED ORDER — TADALAFIL 2.5 MG PO TABS: ORAL_TABLET | ORAL | Status: DC

## 2012-03-26 MED ORDER — DICLOFENAC SODIUM 75 MG PO TBEC: 75.0000 mg | DELAYED_RELEASE_TABLET | Freq: Two times a day (BID) | ORAL | Status: DC

## 2012-03-26 NOTE — Telephone Encounter (Signed)
Medicines sent to pharmacy

## 2012-05-07 ENCOUNTER — Encounter: Payer: Self-pay | Admitting: Gastroenterology

## 2012-05-11 ENCOUNTER — Other Ambulatory Visit: Payer: Self-pay | Admitting: Family Medicine

## 2012-05-11 DIAGNOSIS — R739 Hyperglycemia, unspecified: Secondary | ICD-10-CM

## 2012-05-11 DIAGNOSIS — E785 Hyperlipidemia, unspecified: Secondary | ICD-10-CM

## 2012-05-11 DIAGNOSIS — E349 Endocrine disorder, unspecified: Secondary | ICD-10-CM

## 2012-05-20 ENCOUNTER — Other Ambulatory Visit: Payer: 59

## 2012-05-21 ENCOUNTER — Other Ambulatory Visit (INDEPENDENT_AMBULATORY_CARE_PROVIDER_SITE_OTHER): Payer: 59

## 2012-05-21 DIAGNOSIS — R748 Abnormal levels of other serum enzymes: Secondary | ICD-10-CM

## 2012-05-21 DIAGNOSIS — R945 Abnormal results of liver function studies: Secondary | ICD-10-CM

## 2012-05-21 DIAGNOSIS — E349 Endocrine disorder, unspecified: Secondary | ICD-10-CM

## 2012-05-21 DIAGNOSIS — I10 Essential (primary) hypertension: Secondary | ICD-10-CM

## 2012-05-21 DIAGNOSIS — R7309 Other abnormal glucose: Secondary | ICD-10-CM

## 2012-05-21 DIAGNOSIS — E785 Hyperlipidemia, unspecified: Secondary | ICD-10-CM

## 2012-05-21 DIAGNOSIS — R61 Generalized hyperhidrosis: Secondary | ICD-10-CM

## 2012-05-21 DIAGNOSIS — E291 Testicular hypofunction: Secondary | ICD-10-CM

## 2012-05-21 DIAGNOSIS — R739 Hyperglycemia, unspecified: Secondary | ICD-10-CM

## 2012-05-21 NOTE — Addendum Note (Signed)
Addended by: Baldomero Lamy on: 05/21/2012 08:13 AM   Modules accepted: Orders

## 2012-05-22 LAB — CBC WITH DIFFERENTIAL/PLATELET
Basophils Absolute: 0.1 10*3/uL (ref 0.0–0.2)
Basos: 1 % (ref 0–3)
Eos: 6 % — ABNORMAL HIGH (ref 0–5)
Hemoglobin: 14.1 g/dL (ref 12.6–17.7)
Lymphs: 32 % (ref 14–46)
Monocytes: 14 % — ABNORMAL HIGH (ref 4–12)
Neutrophils Absolute: 2 10*3/uL (ref 1.4–7.0)
RBC: 4.29 x10E6/uL (ref 4.14–5.80)
WBC: 4.2 10*3/uL (ref 3.4–10.8)

## 2012-05-22 LAB — COMPREHENSIVE METABOLIC PANEL
AST: 94 IU/L — ABNORMAL HIGH (ref 0–40)
Albumin/Globulin Ratio: 1.8 (ref 1.1–2.5)
Alkaline Phosphatase: 63 IU/L (ref 39–117)
BUN/Creatinine Ratio: 26 — ABNORMAL HIGH (ref 10–22)
CO2: 21 mmol/L (ref 19–28)
Creatinine, Ser: 1 mg/dL (ref 0.76–1.27)
GFR calc non Af Amer: 80 mL/min/{1.73_m2} (ref >59)
Globulin, Total: 2.5 g/dL (ref 1.5–4.5)
Potassium: 4.9 mmol/L (ref 3.5–5.2)
Sodium: 141 mmol/L (ref 134–144)

## 2012-05-22 LAB — HEMOGLOBIN A1C: Hgb A1c MFr Bld: 5.8 % — ABNORMAL HIGH (ref 4.8–5.6)

## 2012-05-22 LAB — LIPID PANEL: Chol/HDL Ratio: 2.9 ratio units (ref 0.0–5.0)

## 2012-05-22 LAB — TESTOSTERONE: Testosterone: 455 ng/dL (ref 348–1197)

## 2012-05-22 LAB — PSA: PSA: 0.6 ng/mL (ref 0.0–4.0)

## 2012-06-24 ENCOUNTER — Other Ambulatory Visit: Payer: Self-pay | Admitting: Family Medicine

## 2012-11-17 ENCOUNTER — Other Ambulatory Visit: Payer: Self-pay | Admitting: Family Medicine

## 2013-02-08 ENCOUNTER — Other Ambulatory Visit: Payer: Self-pay | Admitting: Family Medicine

## 2013-02-16 ENCOUNTER — Telehealth: Payer: Self-pay

## 2013-02-16 NOTE — Telephone Encounter (Signed)
Pt left v/m; pt needs to schedule appt for refills; I called pt back and left v/m if needs anything further pt can call back because pt called back after leaving me a message and scheduled appt with Dr Deborra Medina on 02/17/13 at 8 am.

## 2013-02-17 ENCOUNTER — Ambulatory Visit (INDEPENDENT_AMBULATORY_CARE_PROVIDER_SITE_OTHER): Payer: 59 | Admitting: Family Medicine

## 2013-02-17 ENCOUNTER — Encounter: Payer: Self-pay | Admitting: Family Medicine

## 2013-02-17 VITALS — BP 148/86 | HR 60 | Temp 98.0°F | Ht 66.0 in | Wt 200.5 lb

## 2013-02-17 DIAGNOSIS — F329 Major depressive disorder, single episode, unspecified: Secondary | ICD-10-CM

## 2013-02-17 DIAGNOSIS — F32A Depression, unspecified: Secondary | ICD-10-CM | POA: Insufficient documentation

## 2013-02-17 DIAGNOSIS — F3289 Other specified depressive episodes: Secondary | ICD-10-CM

## 2013-02-17 DIAGNOSIS — M129 Arthropathy, unspecified: Secondary | ICD-10-CM

## 2013-02-17 MED ORDER — DULOXETINE HCL 30 MG PO CPEP: 30.0000 mg | ORAL_CAPSULE | Freq: Every day | ORAL | Status: DC

## 2013-02-17 MED ORDER — MELOXICAM 7.5 MG PO TABS: ORAL_TABLET | ORAL | Status: DC

## 2013-02-17 MED ORDER — EPINEPHRINE 0.3 MG/0.3ML IJ SOAJ: 0.3000 mg | Freq: Once | INTRAMUSCULAR | Status: DC

## 2013-02-17 NOTE — Progress Notes (Signed)
Pre-visit discussion using our clinic review tool. No additional management support is needed unless otherwise documented below in the visit note.  

## 2013-02-17 NOTE — Assessment & Plan Note (Signed)
>  25 min spent with face to face with patient, >50% counseling and/or coordinating care  Deteriorated and having more pain. Will wean off zoloft, start cymbalta 30 mg daily. Psychotherapy deferred at this time.  He will call me in a few weeks with an update.

## 2013-02-17 NOTE — Progress Notes (Signed)
Subjective:    Patient ID: Jeff Ward, male    DOB: 1950/03/24, 63 y.o.   MRN: [id number]  HPI  63 yo here for prescription refills but wants to talk about depression.  Wife has been chronically ill.  He feels he can't leave her side when she wants him to help her.  He works part time now at [employer]  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  Has been on zoloft 50 mg daily for years, feels it is no longer helping.  Has also lost 6 family members this past year. He is having trouble concentrating and sleeping. Having more joint pain which is also worsening his sleep. Has no desire to do anything, tearful at times. Denies panic attacks. No SI or HI.  Patient Active Problem List   Diagnosis Date Noted  . Depression 02/17/2013  . Hyperglycemia 07/23/2011  . OSA (obstructive sleep apnea) 04/10/2011  . Testosterone deficiency 03/06/2011  . Rectal bleeding 01/09/2011  . Night sweats 05/09/2010  . Degenerative disc disease, lumbar 05/09/2010  . BLOOD IN STOOL 11/15/2009  . OTHER NONSPECIFIC ABNORMAL SERUM ENZYME LEVELS 05/23/2009  . LEG CRAMPS 05/15/2009  . PALPITATIONS 05/15/2009  . LUMBAR STRAIN 11/28/2008  . ESSENTIAL HYPERTENSION, BENIGN 09/01/2007  . COLONIC POLYPS 10/23/2006  . HYPERLIPIDEMIA 10/23/2006  . PUD 10/23/2006  . ARTHRITIS, GENERALIZED 10/23/2006  . CARPAL TUNNEL SYNDROME, BILATERAL, HX OF 10/23/2006  . BENIGN PROSTATIC HYPERTROPHY, HX OF 10/23/2006  . SHOULDER PAIN, RIGHT 10/21/2006  . LIVER FUNCTION TESTS, ABNORMAL 06/04/2006   Past Medical History  Diagnosis Date  . 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 Hyperlipidemia 06/2002  . Hypertension    Past Surgical History  Procedure Laterality Date  . Carpal tunnel release  2005    and trigger finger  . Tonsillectomy and adenoidectomy     History  Substance Use Topics  . Smoking status: Former Smoker -- 35 years    Types: Cigars    Quit  date: 04/02/2010  . Smokeless tobacco: Never Used     Comment: smoked cigars 1 to 2 daily  . Alcohol Use: 1.2 oz/week    2 Cans of beer per week     Comment: 2-3 beers a day   Family History  Problem Relation Age of Onset  . Diabetes Mother   . Cancer Father     history of melanona  . Diabetes Sister   . Cancer Paternal Aunt     breast  . Alcohol abuse Maternal Grandfather    Allergies  Allergen Reactions  . Nutritional Supplements   . Sulfonamide Derivatives     REACTION: unconsciousness   Current Outpatient Prescriptions on File Prior to Visit  Medication Sig Dispense Refill  . CIALIS 2.5 MG TABS Take one tablet by mouth  daily  90 each  0  . diclofenac (VOLTAREN) 75 MG EC tablet Take 1 tablet by mouth 2  times daily  180 tablet  0  . fenofibrate (TRICOR) 145 MG tablet Take 1 tablet by mouth  daily  30 tablet  0  . fluticasone (FLONASE) 50 MCG/ACT nasal spray Place 1 spray into the nose daily.  16 g  0  . glucosamine-chondroitin 500-400 MG tablet Take 1 tablet by mouth 2 (two) times daily.        . lansoprazole (PREVACID) 30 MG capsule Take 1 capsule (30 mg total) by mouth daily.  [place]  capsule  1  . lisinopril (PRINIVIL,ZESTRIL) 20 MG tablet Take 1 tablet by mouth  daily  30 tablet  0  . Potassium 99 MG TABS Take 1 tablet by mouth 2 (two) times daily.        . sertraline (ZOLOFT) 50 MG tablet Take 1 tablet by mouth  daily.  90 tablet  0  . SUMAtriptan (IMITREX) 50 MG tablet Take 50 mg by mouth once as needed.       No current facility-administered medications on file prior to visit.   The PMH, PSH, Social History, Family History, Medications, and allergies have been reviewed in [facility], and have been updated if relevant.  Review of Systems See HPI    Objective:   Physical Exam BP 148/86  Pulse 60  Temp(Src) 98 F (36.7 C) (Oral)  Ht 5\' 6"  (1.676 m)  Wt 200 lb 8 oz (90.946 kg)  BMI 32.38 kg/m2  SpO2 97% Wt Readings from Last 3 Encounters:  02/17/13 200 lb 8 oz (90.946  kg)  03/25/12 196 lb (88.905 kg)  11/20/11 203 lb (92.08 kg)   General:  overweght male in NAD Eyes:  PERRL Ears:  External ear exam shows no significant lesions or deformities.  Otoscopic examination reveals clear canals, tympanic membranes are intact bilaterally without bulging, retraction, inflammation or discharge. Hearing is grossly normal bilaterally. Nose:  External nasal examination shows no deformity or inflammation. Nasal mucosa are pink and moist without lesions or exudates. Mouth:  Oral mucosa and oropharynx without lesions or exudates.  Teeth in good repair. Neck:  no carotid bruit or thyromegaly no cervical or supraclavicular lymphadenopathy  Lungs:  Normal respiratory effort, chest expands symmetrically. Lungs are clear to auscultation, no crackles or wheezes. Heart:  Normal rate and regular rhythm. S1 and S2 normal without gallop, murmur, click, rub or other extra sounds. Abdomen:  Bowel sounds positive,abdomen soft and non-tender without masses, organomegaly or hernias noted. Pulses:  R and L posterior tibial pulses are full and equal bilaterally  Extremities:  no edema         Assessment & Plan:

## 2013-02-17 NOTE — Assessment & Plan Note (Signed)
Deteriorated. Start cymbalta 30 mg daily.

## 2013-02-17 NOTE — Patient Instructions (Addendum)
Go ahead and start cymbalta right away. Let's wean zoloft - 1/2 tablet every other day for 1-2 weeks and stop.  Hang in there.

## 2013-03-31 ENCOUNTER — Other Ambulatory Visit: Payer: Self-pay | Admitting: *Deleted

## 2013-03-31 MED ORDER — DULOXETINE HCL 30 MG PO CPEP: 30.0000 mg | ORAL_CAPSULE | Freq: Every day | ORAL | Status: DC

## 2013-03-31 NOTE — Telephone Encounter (Signed)
90 day supply not originally sent

## 2013-04-14 ENCOUNTER — Other Ambulatory Visit: Payer: Self-pay | Admitting: Family Medicine

## 2013-04-14 DIAGNOSIS — Z0000 Encounter for general adult medical examination without abnormal findings: Secondary | ICD-10-CM

## 2013-04-14 DIAGNOSIS — I10 Essential (primary) hypertension: Secondary | ICD-10-CM

## 2013-04-14 DIAGNOSIS — E785 Hyperlipidemia, unspecified: Secondary | ICD-10-CM

## 2013-04-14 DIAGNOSIS — Z125 Encounter for screening for malignant neoplasm of prostate: Secondary | ICD-10-CM

## 2013-04-20 ENCOUNTER — Other Ambulatory Visit (INDEPENDENT_AMBULATORY_CARE_PROVIDER_SITE_OTHER): Payer: 59

## 2013-04-20 DIAGNOSIS — Z125 Encounter for screening for malignant neoplasm of prostate: Secondary | ICD-10-CM

## 2013-04-20 DIAGNOSIS — R7309 Other abnormal glucose: Secondary | ICD-10-CM

## 2013-04-20 DIAGNOSIS — Z0000 Encounter for general adult medical examination without abnormal findings: Secondary | ICD-10-CM

## 2013-04-20 DIAGNOSIS — R748 Abnormal levels of other serum enzymes: Secondary | ICD-10-CM

## 2013-04-20 DIAGNOSIS — R739 Hyperglycemia, unspecified: Secondary | ICD-10-CM

## 2013-04-20 DIAGNOSIS — I10 Essential (primary) hypertension: Secondary | ICD-10-CM

## 2013-04-20 DIAGNOSIS — R945 Abnormal results of liver function studies: Secondary | ICD-10-CM

## 2013-04-20 DIAGNOSIS — E785 Hyperlipidemia, unspecified: Secondary | ICD-10-CM

## 2013-04-21 LAB — CBC WITH DIFFERENTIAL/PLATELET
BASOS: 1 %
Basophils Absolute: 0.1 10*3/uL (ref 0.0–0.2)
Eos: 7 %
Eosinophils Absolute: 0.3 10*3/uL (ref 0.0–0.4)
HEMATOCRIT: 41 % (ref 37.5–51.0)
Hemoglobin: 14.1 g/dL (ref 12.6–17.7)
Immature Grans (Abs): 0 10*3/uL (ref 0.0–0.1)
Immature Granulocytes: 0 %
LYMPHS ABS: 1.6 10*3/uL (ref 0.7–3.1)
Lymphs: 33 %
MCH: 32.7 pg (ref 26.6–33.0)
MCHC: 34.4 g/dL (ref 31.5–35.7)
MCV: 95 fL (ref 79–97)
MONOS ABS: 0.6 10*3/uL (ref 0.1–0.9)
Monocytes: 13 %
Neutrophils Absolute: 2.2 10*3/uL (ref 1.4–7.0)
Neutrophils Relative %: 46 %
RBC: 4.31 x10E6/uL (ref 4.14–5.80)
RDW: 13.8 % (ref 12.3–15.4)
WBC: 4.8 10*3/uL (ref 3.4–10.8)

## 2013-04-21 LAB — LIPID PANEL
CHOL/HDL RATIO: 4.2 ratio (ref 0.0–5.0)
Cholesterol, Total: 191 mg/dL (ref 100–199)
HDL: 45 mg/dL (ref >39)
LDL Calculated: 112 mg/dL — ABNORMAL HIGH (ref 0–99)
Triglycerides: 172 mg/dL — ABNORMAL HIGH (ref 0–149)
VLDL Cholesterol Cal: 34 mg/dL (ref 5–40)

## 2013-04-21 LAB — COMPREHENSIVE METABOLIC PANEL
ALT: 97 IU/L — ABNORMAL HIGH (ref 0–44)
AST: 97 IU/L — ABNORMAL HIGH (ref 0–40)
Albumin/Globulin Ratio: 1.8 (ref 1.1–2.5)
Albumin: 4.7 g/dL (ref 3.6–4.8)
Alkaline Phosphatase: 75 IU/L (ref 39–117)
BUN / CREAT RATIO: 17 (ref 10–22)
BUN: 14 mg/dL (ref 8–27)
CALCIUM: 9.4 mg/dL (ref 8.6–10.2)
CO2: 25 mmol/L (ref 18–29)
CREATININE: 0.84 mg/dL (ref 0.76–1.27)
Chloride: 99 mmol/L (ref 97–108)
GFR calc non Af Amer: 93 mL/min/{1.73_m2} (ref >59)
GFR, EST AFRICAN AMERICAN: 108 mL/min/{1.73_m2} (ref >59)
GLOBULIN, TOTAL: 2.6 g/dL (ref 1.5–4.5)
Glucose: 141 mg/dL — ABNORMAL HIGH (ref 65–99)
Potassium: 4.8 mmol/L (ref 3.5–5.2)
SODIUM: 142 mmol/L (ref 134–144)
Total Bilirubin: 0.4 mg/dL (ref 0.0–1.2)
Total Protein: 7.3 g/dL (ref 6.0–8.5)

## 2013-04-21 LAB — PSA: PSA: 0.8 ng/mL (ref 0.0–4.0)

## 2013-04-27 ENCOUNTER — Ambulatory Visit (INDEPENDENT_AMBULATORY_CARE_PROVIDER_SITE_OTHER): Payer: Managed Care, Other (non HMO) | Admitting: Family Medicine

## 2013-04-27 ENCOUNTER — Telehealth: Payer: Self-pay | Admitting: Family Medicine

## 2013-04-27 ENCOUNTER — Encounter: Payer: Self-pay | Admitting: Family Medicine

## 2013-04-27 VITALS — BP 174/92 | HR 72 | Temp 97.8°F | Ht 65.0 in | Wt 194.5 lb

## 2013-04-27 DIAGNOSIS — Z0000 Encounter for general adult medical examination without abnormal findings: Secondary | ICD-10-CM

## 2013-04-27 DIAGNOSIS — I10 Essential (primary) hypertension: Secondary | ICD-10-CM

## 2013-04-27 DIAGNOSIS — Z860100 Personal history of colon polyps, unspecified: Secondary | ICD-10-CM

## 2013-04-27 DIAGNOSIS — D229 Melanocytic nevi, unspecified: Secondary | ICD-10-CM

## 2013-04-27 DIAGNOSIS — D239 Other benign neoplasm of skin, unspecified: Secondary | ICD-10-CM

## 2013-04-27 DIAGNOSIS — Z1211 Encounter for screening for malignant neoplasm of colon: Secondary | ICD-10-CM

## 2013-04-27 DIAGNOSIS — R7309 Other abnormal glucose: Secondary | ICD-10-CM

## 2013-04-27 DIAGNOSIS — R600 Localized edema: Secondary | ICD-10-CM

## 2013-04-27 DIAGNOSIS — R609 Edema, unspecified: Secondary | ICD-10-CM

## 2013-04-27 DIAGNOSIS — Z23 Encounter for immunization: Secondary | ICD-10-CM

## 2013-04-27 DIAGNOSIS — Z2911 Encounter for prophylactic immunotherapy for respiratory syncytial virus (RSV): Secondary | ICD-10-CM

## 2013-04-27 DIAGNOSIS — R739 Hyperglycemia, unspecified: Secondary | ICD-10-CM

## 2013-04-27 NOTE — Progress Notes (Signed)
Pre visit review using our clinic review tool, if applicable. No additional management support is needed unless otherwise documented below in the visit note. 

## 2013-04-27 NOTE — Assessment & Plan Note (Signed)
Concern for possible BCC or SCC.  Refer to derm. The patient indicates understanding of these issues and agrees with the plan.

## 2013-04-27 NOTE — Assessment & Plan Note (Signed)
Deteriorated but not taken his medications today. Advised to take as soon as he gets home.

## 2013-04-27 NOTE — Addendum Note (Signed)
Addended by: Marchia Bond on: 04/27/2013 10:24 AM   Modules accepted: Orders

## 2013-04-27 NOTE — Assessment & Plan Note (Signed)
?  if truly a fasting sample. Check a1c today. Will need to start DM meds and teaching ASAP if a1c above 6.5.

## 2013-04-27 NOTE — Assessment & Plan Note (Signed)
No edema on exam today.  Likely dependent edema at end of day.  Walks on concrete floors for 8 -10 hours a day. Advised more supportive shoes and elevating feet at end of day. The patient indicates understanding of these issues and agrees with the plan.

## 2013-04-27 NOTE — Telephone Encounter (Signed)
Relevant patient education mailed to patient.  

## 2013-04-27 NOTE — Assessment & Plan Note (Addendum)
Reviewed preventive care protocols, scheduled due services, and updated immunizations Discussed nutrition, exercise, diet, and healthy lifestyle.  Refer to Dr. Fuller Plan for recall colonoscopy.  Zostavax today.

## 2013-04-27 NOTE — Progress Notes (Signed)
Subjective:   Patient ID: Jeff Ward, male    DOB: July 17, 1950, 63 y.o.   MRN: [id number]  Jeff Ward is a pleasant 63 y.o. year old male who presents to clinic today with Annual Exam and Leg Swelling  on 04/27/2013  HPI:  Hyperglycemia-  FSBS increased to 141 this month.  Pt thinks he was fasting.  Denies any increased thirst or urination. Lab Results  Component Value Date   HGBA1C 5.8* 05/21/2012    Last colonoscopy 7/23/08Fuller Plan, advised 5 year recall.  He would have been due for recall on 08/19/11.  Quit smoking!  Lab Results  Component Value Date   PSA 0.8 04/20/2013   PSA 0.6 05/21/2012   PSA 0.7 05/20/2006    HLD- on Tricor 145 mg daily. Lab Results  Component Value Date   HDL 45 04/20/2013   LDLCALC 112* 04/20/2013   TRIG 172* 04/20/2013   CHOLHDL 4.2 04/20/2013   HTN- has been controlled on lisinopril 20 mg daily but BP is elevated today.  He did not take his blood pressure medication this morning. BP Readings from Last 3 Encounters:  04/27/13 174/92  02/17/13 148/86  03/25/12 138/68   Working at [employer] and loves it!  Walking a lot on concrete floors.  Have some LE edema at end of the day.  No LE pain or erythema.  Patient Active Problem List   Diagnosis Date Noted  . Routine general medical examination at a health care facility 04/27/2013  . Personal history of colonic polyps 04/27/2013  . Leg edema 04/27/2013  . Depression 02/17/2013  . Hyperglycemia 07/23/2011  . OSA (obstructive sleep apnea) 04/10/2011  . Testosterone deficiency 03/06/2011  . Night sweats 05/09/2010  . Degenerative disc disease, lumbar 05/09/2010  . BLOOD IN STOOL 11/15/2009  . OTHER NONSPECIFIC ABNORMAL SERUM ENZYME LEVELS 05/23/2009  . LEG CRAMPS 05/15/2009  . PALPITATIONS 05/15/2009  . LUMBAR STRAIN 11/28/2008  . ESSENTIAL HYPERTENSION, BENIGN 09/01/2007  . COLONIC POLYPS 10/23/2006  . HYPERLIPIDEMIA 10/23/2006  . PUD 10/23/2006  . ARTHRITIS, GENERALIZED 10/23/2006  .  CARPAL TUNNEL SYNDROME, BILATERAL, HX OF 10/23/2006  . BENIGN PROSTATIC HYPERTROPHY, HX OF 10/23/2006  . SHOULDER PAIN, RIGHT 10/21/2006  . LIVER FUNCTION TESTS, ABNORMAL 06/04/2006   Past Medical History  Diagnosis Date  . 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 Hyperlipidemia 06/2002  . Hypertension    Past Surgical History  Procedure Laterality Date  . Carpal tunnel release  2005    and trigger finger  . Tonsillectomy and adenoidectomy     History  Substance Use Topics  . Smoking status: Former Smoker -- 35 years    Types: Cigars    Quit date: 04/02/2010  . Smokeless tobacco: Never Used     Comment: smoked cigars 1 to 2 daily  . Alcohol Use: 1.2 oz/week    2 Cans of beer per week     Comment: 2-3 beers a day   Family History  Problem Relation Age of Onset  . Diabetes Mother   . Cancer Father     history of melanona  . Diabetes Sister   . Cancer Paternal Aunt     breast  . Alcohol abuse Maternal Grandfather    Allergies  Allergen Reactions  . Nutritional Supplements   . Sulfonamide Derivatives     REACTION: unconsciousness   Current Outpatient Prescriptions on File Prior to Visit  Medication Sig Dispense Refill  . CIALIS 2.5  MG TABS Take one tablet by mouth  daily  90 each  0  . diclofenac (VOLTAREN) 75 MG EC tablet Take 1 tablet by mouth 2  times daily  180 tablet  0  . DULoxetine (CYMBALTA) 30 MG capsule Take 1 capsule (30 mg total) by mouth daily.  90 capsule  0  . EPINEPHrine (EPI-PEN) 0.3 mg/0.3 mL SOAJ injection Inject 0.3 mLs (0.3 mg total) into the muscle once.  1 Device  1  . fenofibrate (TRICOR) 145 MG tablet Take 1 tablet by mouth  daily  30 tablet  0  . glucosamine-chondroitin 500-400 MG tablet Take 1 tablet by mouth 2 (two) times daily.        . lansoprazole (PREVACID) 30 MG capsule Take 1 capsule (30 mg total) by mouth daily.  90 capsule  1  . lisinopril (PRINIVIL,ZESTRIL) 20 MG tablet Take 1 tablet by mouth  daily  30 tablet  0    . Potassium 99 MG TABS Take 1 tablet by mouth 2 (two) times daily.        . sertraline (ZOLOFT) 50 MG tablet Take 1 tablet by mouth  daily.  90 tablet  0  . SUMAtriptan (IMITREX) 50 MG tablet Take 50 mg by mouth once as needed.       No current facility-administered medications on file prior to visit.   The PMH, PSH, Social History, Family History, Medications, and allergies have been reviewed in [facility], and have been updated if relevant.    Review of Systems See HPI Denies blood in stool No changes in bowel habits No abdominal pain No CP or SOB No anxiety or depression No DOE +skin lesions    Objective:    BP 174/92  Pulse 72  Temp(Src) 97.8 F (36.6 C) (Oral)  Ht 5\' 5"  (1.651 m)  Wt 194 lb 8 oz (88.225 kg)  BMI 32.37 kg/m2  SpO2 97%   Physical Exam  General:  overweght male in NAD Eyes:  PERRL Ears:  External ear exam shows no significant lesions or deformities.  Otoscopic examination reveals clear canals, tympanic membranes are intact bilaterally without bulging, retraction, inflammation or discharge. Hearing is grossly normal bilaterally. Nose:  External nasal examination shows no deformity or inflammation. Nasal mucosa are pink and moist without lesions or exudates. Mouth:  Oral mucosa and oropharynx without lesions or exudates.  Teeth in good repair. Neck:  no carotid bruit or thyromegaly no cervical or supraclavicular lymphadenopathy  Lungs:  Normal respiratory effort, chest expands symmetrically. Lungs are clear to auscultation, no crackles or wheezes. Heart:  Normal rate and regular rhythm. S1 and S2 normal without gallop, murmur, click, rub or other extra sounds. Abdomen:  Bowel sounds positive,abdomen soft and non-tender without masses, organomegaly or hernias noted. Pulses:  R and L posterior tibial pulses are full and equal bilaterally  Extremities:  no edema  Skin: Small raised pink lesions on scalp and on left ear       Assessment & Plan:   Routine  general medical examination at a health care facility No Follow-up on file.

## 2013-04-27 NOTE — Patient Instructions (Addendum)
Good to see you. We will call you with your GI/colonoscopy appointment along with a dermatology referral.   I will also call you with your a1c results.  Get more supportive shoes to wear to work.

## 2013-04-28 LAB — HEMOGLOBIN A1C
Est. average glucose Bld gHb Est-mCnc: 120 mg/dL
HEMOGLOBIN A1C: 5.8 % — AB (ref 4.8–5.6)

## 2013-05-02 ENCOUNTER — Encounter: Payer: Self-pay | Admitting: *Deleted

## 2013-05-09 ENCOUNTER — Encounter: Payer: Self-pay | Admitting: Gastroenterology

## 2013-06-16 ENCOUNTER — Other Ambulatory Visit: Payer: Self-pay | Admitting: Family Medicine

## 2013-06-28 ENCOUNTER — Ambulatory Visit (AMBULATORY_SURGERY_CENTER): Payer: Managed Care, Other (non HMO)

## 2013-06-28 VITALS — Ht 65.0 in | Wt 193.4 lb

## 2013-06-28 DIAGNOSIS — Z86010 Personal history of colonic polyps: Secondary | ICD-10-CM

## 2013-06-28 MED ORDER — MOVIPREP 100 G PO SOLR: ORAL | Status: DC

## 2013-06-28 NOTE — Progress Notes (Signed)
Per pt, no allergies to soy or egg products.Pt not taking any weight loss meds or using  O2 at home. 

## 2013-07-19 ENCOUNTER — Ambulatory Visit (AMBULATORY_SURGERY_CENTER): Payer: Managed Care, Other (non HMO) | Admitting: Gastroenterology

## 2013-07-19 ENCOUNTER — Encounter: Payer: Self-pay | Admitting: Gastroenterology

## 2013-07-19 VITALS — BP 130/69 | HR 67 | Temp 96.6°F | Resp 20 | Ht 65.0 in | Wt 193.0 lb

## 2013-07-19 DIAGNOSIS — Z86010 Personal history of colonic polyps: Secondary | ICD-10-CM

## 2013-07-19 DIAGNOSIS — D126 Benign neoplasm of colon, unspecified: Secondary | ICD-10-CM

## 2013-07-19 DIAGNOSIS — D125 Benign neoplasm of sigmoid colon: Secondary | ICD-10-CM

## 2013-07-19 DIAGNOSIS — K648 Other hemorrhoids: Secondary | ICD-10-CM

## 2013-07-19 DIAGNOSIS — K921 Melena: Secondary | ICD-10-CM

## 2013-07-19 MED ORDER — SODIUM CHLORIDE 0.9 % IV SOLN: 500.0000 mL | INTRAVENOUS | Status: DC

## 2013-07-19 NOTE — Patient Instructions (Signed)
YOU HAD AN ENDOSCOPIC PROCEDURE TODAY AT THE [facility]: Refer to the procedure report that was given to you for any specific questions about what was found during the examination.  If the procedure report does not answer your questions, please call your gastroenterologist to clarify.  If you requested that your care partner not be given the details of your procedure findings, then the procedure report has been included in a sealed envelope for you to review at your convenience later.  YOU SHOULD EXPECT: Some feelings of bloating in the abdomen. Passage of more gas than usual.  Walking can help get rid of the air that was put into your GI tract during the procedure and reduce the bloating. If you had a lower endoscopy (such as a colonoscopy or flexible sigmoidoscopy) you may notice spotting of blood in your stool or on the toilet paper. If you underwent a bowel prep for your procedure, then you may not have a normal bowel movement for a few days.  DIET: Your first meal following the procedure should be a light meal and then it is ok to progress to your normal diet.  A half-sandwich or bowl of soup is an example of a good first meal.  Heavy or fried foods are harder to digest and may make you feel nauseous or bloated.  Likewise meals heavy in dairy and vegetables can cause extra gas to form and this can also increase the bloating.  Drink plenty of fluids but you should avoid alcoholic beverages for 24 hours.  ACTIVITY: Your care partner should take you home directly after the procedure.  You should plan to take it easy, moving slowly for the rest of the day.  You can resume normal activity the day after the procedure however you should NOT DRIVE or use heavy machinery for 24 hours (because of the sedation medicines used during the test).    SYMPTOMS TO REPORT IMMEDIATELY: A gastroenterologist can be reached at any hour.  During normal business hours, 8:30 AM to 5:00 PM Monday through Friday,  call [phone].  After hours and on weekends, please call the GI answering service at [phone] who will take a message and have the physician on call contact you.   Following lower endoscopy (colonoscopy or flexible sigmoidoscopy):  Excessive amounts of blood in the stool  Significant tenderness or worsening of abdominal pains  Swelling of the abdomen that is new, acute  Fever of 100F or higher    FOLLOW UP: If any biopsies were taken you will be contacted by phone or by letter within the next 1-3 weeks.  Call your gastroenterologist if you have not heard about the biopsies in 3 weeks.  Our staff will call the home number listed on your records the next business day following your procedure to check on you and address any questions or concerns that you may have at that time regarding the information given to you following your procedure. This is a courtesy call and so if there is no answer at the home number and we have not heard from you through the emergency physician on call, we will assume that you have returned to your regular daily activities without incident.  SIGNATURES/CONFIDENTIALITY: You and/or your care partner have signed paperwork which will be entered into your electronic medical record.  These signatures attest to the fact that that the information above on your After Visit Summary has been reviewed and is understood.  Full responsibility of the confidentiality   of this discharge information lies with you and/or your care-partner.  Polyp, diverticulosis,hemorrhoid, and high fiber diet information given.    Hold aspirin, non-steroidal anti-inflamatory medications for 2 weeks.  Next colonoscopy 5 years.

## 2013-07-19 NOTE — Op Note (Signed)
[place]  [address] [place], [id number]   COLONOSCOPY PROCEDURE REPORT  PATIENT: Jeff Ward, Jeff Ward A.  MR#: [id number] BIRTHDATE: 01-Dec-1950 , 62  yrs. old GENDER: Male ENDOSCOPIST: Ladene Artist, MD, [facility] PROCEDURE DATE:  07/19/2013 PROCEDURE:   Colonoscopy with snare polypectomy and hemorrhoidectomy via sclerosing First Screening Colonoscopy - Avg.  risk and is 50 yrs.  old or older - No.  Prior Negative Screening - Now for repeat screening. N/A  History of Adenoma - Now for follow-up colonoscopy & has been > or = to 3 yrs.  Yes hx of adenoma.  Has been 3 or more years since last colonoscopy.  Polyps Removed Today? Yes. ASA CLASS:   Class II INDICATIONS:Patient's personal history of adenomatous colon polyps. Hematochezia. MEDICATIONS: MAC sedation, administered by CRNA and propofol (Diprivan) 290mg  IV DESCRIPTION OF PROCEDURE:   After the risks benefits and alternatives of the procedure were thoroughly explained, informed consent was obtained.  A digital rectal exam revealed no abnormalities of the rectum.   The LB [id number]  endoscope was introduced through the anus and advanced to the cecum, which was identified by both the appendix and ileocecal valve. No adverse events experienced.   The quality of the prep was good, using MoviPrep  The instrument was then slowly withdrawn as the colon was fully examined.  COLON FINDINGS: Two sessile polyps measuring 5 mm in size were found in the sigmoid colon and rectum.  A polypectomy was performed with a cold snare.  The resection was complete and the polyp tissue was completely retrieved.   Mild diverticulosis was noted in the sigmoid colon. Moderate sized, bleeding internal hemorrhoids were found on retroflexed view of the rectum. 2.5 cc of 23.4% saline injected into the internal hemorrhoids well above the dentate line. The colon was otherwise normal.  There was no diverticulosis, inflammation, polyps or  cancers unless previously stated. Retroflexed views revealed internal hemorrhoids. The time to cecum=1 minutes 46 seconds.  Withdrawal time=16 minutes 38 seconds. The scope was withdrawn and the procedure completed. COMPLICATIONS: There were no complications.  ENDOSCOPIC IMPRESSION: 1.   Two sessile polyps measuring 5 mm in the sigmoid colon and rectum; polypectomy performed with a cold snare 2.   Mild diverticulosis in the sigmoid colon 3.   Moderate sized internal hemorrhoids  RECOMMENDATIONS: 1.  Hold aspirin, aspirin products, and anti-inflammatory medication for 2 weeks. 2.  Await pathology results 3.  High fiber diet with liberal fluid intake. 4.  Repeat Colonoscopy in 5 years.  eSigned:  Ladene Artist, MD, [facility] 07/19/2013 9:43 AM

## 2013-07-19 NOTE — Progress Notes (Signed)
Called to room to assist during endoscopic procedure.  Patient ID and intended procedure confirmed with present staff. Received instructions for my participation in the procedure from the performing physician.  

## 2013-07-19 NOTE — Progress Notes (Addendum)
810- Patient complain of feeling light headed and dizziness. Also, c/o nausea; pt pale and clammy.  Not a diabetic.  BP 101/64, initialy BP 169/88.  Assisted to the BR- pt has had 2 episodes of bloody stools this a.m.- once when he arrived to the [facility] and then now.  He states he does have a hx of hemorrhoids.  Josh CRNA made aware.  He orders for pt to have full liter of fluid and HOB down before his procedure.  820- pt pink, warm and dry.  BP- 125/70.  No complaints at this time.  IV running wide open and HOB down at this time  830- no changes in statues.  BP120/69.  Denies any further feelings of lightheadedness or dizziness

## 2013-07-19 NOTE — Progress Notes (Signed)
Report to PACU, RN, vss, BBS= Clear.  

## 2013-07-20 ENCOUNTER — Telehealth: Payer: Self-pay | Admitting: *Deleted

## 2013-07-20 NOTE — Telephone Encounter (Signed)
  Follow up Call-  Call back number 07/19/2013  Post procedure Call Back phone  # [phone]  Permission to leave phone message Yes    [facility]

## 2013-07-30 ENCOUNTER — Encounter: Payer: Self-pay | Admitting: Gastroenterology

## 2013-11-10 ENCOUNTER — Ambulatory Visit: Payer: Managed Care, Other (non HMO) | Admitting: Family Medicine

## 2013-11-10 ENCOUNTER — Encounter: Payer: Self-pay | Admitting: Family Medicine

## 2013-11-10 ENCOUNTER — Ambulatory Visit (INDEPENDENT_AMBULATORY_CARE_PROVIDER_SITE_OTHER): Payer: Managed Care, Other (non HMO) | Admitting: Family Medicine

## 2013-11-10 VITALS — BP 180/90 | HR 76 | Temp 98.5°F | Wt 198.2 lb

## 2013-11-10 DIAGNOSIS — R519 Headache, unspecified: Secondary | ICD-10-CM

## 2013-11-10 DIAGNOSIS — I10 Essential (primary) hypertension: Secondary | ICD-10-CM

## 2013-11-10 DIAGNOSIS — R51 Headache: Secondary | ICD-10-CM

## 2013-11-10 DIAGNOSIS — E785 Hyperlipidemia, unspecified: Secondary | ICD-10-CM

## 2013-11-10 MED ORDER — LISINOPRIL 20 MG PO TABS: ORAL_TABLET | ORAL | Status: DC

## 2013-11-10 NOTE — Assessment & Plan Note (Signed)
Encouraged restart lisinopril 20mg  daily, then start checking once a week at local pharmacy. Keep log and bring to next appt in 1 month.

## 2013-11-10 NOTE — Assessment & Plan Note (Signed)
Encouraged restart tricor, discussed reasons to treat HTN and HLD - specifically for prevention of MI/CVA.

## 2013-11-10 NOTE — Patient Instructions (Addendum)
This headache could be blood pressure related because it's very high today Restart lisinopril and other meds. Keep an eye on blood pressure once a week at local pharmacy, jot down numbers ,and bring to next appointment. Return in 1 month for f/u HTN with PCP

## 2013-11-10 NOTE — Progress Notes (Signed)
Pre visit review using our clinic review tool, if applicable. No additional management support is needed unless otherwise documented below in the visit note. 

## 2013-11-10 NOTE — Assessment & Plan Note (Signed)
Anticipate hypertension related headache. Restart lisinopril 20mg  today, monitor bp and HA at home. rtc 1 mo with PCP ,sooner if worsening or if needed.

## 2013-11-10 NOTE — Progress Notes (Signed)
BP 180/90  Pulse 76  Temp(Src) 98.5 F (36.9 C) (Oral)  Wt 198 lb 4 oz (89.926 kg)   CC: HA  Subjective:    Patient ID: Jeff Ward, male    DOB: September 30, 1950, 63 y.o.   MRN: [id number]  HPI: Jeff Ward is a 63 y.o. male presenting on 11/10/2013 for Headache   HA - ongoing for last few weeks. Intermittent. Sometimes shooting pains, other times dull ache, other times band around head. Sometimes feels sinus pressure pain as well.  No nausea, photo or phonophobia, vision changes. Denies one sided numbness, weakness, slurred speech or AMS, no dizziness. Denies fevers/chills, joint aches. H/o migraines when he worked at [employer] exposed to [employer] - no trouble since he changed his job.  HTN - very elevated today. Stopped lisinopril several months ago. Denies vision changes, CP/tightness, SOB, leg swelling.  Stopped all meds several months ago "I don't feel any different".   Also with bug bite several months ago right upper back, still pruritic.  BP Readings from Last 3 Encounters:  11/10/13 180/90  07/19/13 130/69  04/27/13 174/92    Relevant past medical, surgical, family and social history reviewed and updated as indicated.  Allergies and medications reviewed and updated. Current Outpatient Prescriptions on File Prior to Visit  Medication Sig  . EPINEPHrine (EPI-PEN) 0.3 mg/0.3 mL SOAJ injection Inject 0.3 mLs (0.3 mg total) into the muscle once.  Marland Kitchen CIALIS 2.5 MG TABS Take one tablet by mouth  daily  . diclofenac (VOLTAREN) 75 MG EC tablet Take 1 tablet by mouth  twice a day  . DULoxetine (CYMBALTA) 30 MG capsule Take 1 capsule (30 mg total) by mouth daily.  . fenofibrate (TRICOR) 145 MG tablet Take 1 tablet by mouth  daily  . glucosamine-chondroitin 500-400 MG tablet Take 1 tablet by mouth 2 (two) times daily.    . lansoprazole (PREVACID) 30 MG capsule Take 1 capsule by mouth  daily  . meloxicam (MOBIC) 7.5 MG tablet Take 1 tablet by mouth  twice a  day  . Multiple Vitamin (MULTIVITAMIN) tablet Take 1 tablet by mouth daily. Mens MVI-Take one daily  . Potassium 99 MG TABS Take 1 tablet by mouth 2 (two) times daily.    . sertraline (ZOLOFT) 50 MG tablet Take 1 tablet by mouth  daily.  . SUMAtriptan (IMITREX) 50 MG tablet Take 50 mg by mouth once as needed.   No current facility-administered medications on file prior to visit.    Review of Systems Per HPI unless specifically indicated above    Objective:    BP 180/90  Pulse 76  Temp(Src) 98.5 F (36.9 C) (Oral)  Wt 198 lb 4 oz (89.926 kg)  Physical Exam  Nursing note and vitals reviewed. Constitutional: He is oriented to person, place, and time. He appears well-developed and well-nourished. No distress.  HENT:  Mouth/Throat: Oropharynx is clear and moist. No oropharyngeal exudate.  Eyes: Conjunctivae and EOM are normal. Pupils are equal, round, and reactive to light.  Neck: Carotid bruit is not present.  Cardiovascular: Normal rate, regular rhythm, normal heart sounds and intact distal pulses.   No murmur heard. Pulmonary/Chest: Effort normal and breath sounds normal. No respiratory distress. He has no wheezes. He has no rales.  Musculoskeletal: He exhibits no edema.  Neurological: He is alert and oriented to person, place, and time. He has normal strength. No cranial nerve deficit or sensory deficit.  CN 2-12 intact Station and gait intact FTN  intact  Skin: Skin is warm and dry. No rash noted.  R upper back - possible small dark foreign object easily removed with alcohol pad.       Assessment & Plan:   Problem List Items Addressed This Visit   HLD (hyperlipidemia)     Encouraged restart tricor, discussed reasons to treat HTN and HLD - specifically for prevention of MI/CVA.    Relevant Medications      lisinopril (PRINIVIL,ZESTRIL) tablet   Headache - Primary     Anticipate hypertension related headache. Restart lisinopril 20mg  today, monitor bp and HA at home. rtc  1 mo with PCP ,sooner if worsening or if needed.    Essential hypertension, benign     Encouraged restart lisinopril 20mg  daily, then start checking once a week at local pharmacy. Keep log and bring to next appt in 1 month.    Relevant Medications      lisinopril (PRINIVIL,ZESTRIL) tablet       Follow up plan: Return in about 1 month (around 12/11/2013), or if symptoms worsen or fail to improve, for follow up visit.

## 2013-11-17 ENCOUNTER — Telehealth: Payer: Self-pay | Admitting: Family Medicine

## 2013-11-17 NOTE — Telephone Encounter (Signed)
Pt called to inform you his BP was 182/97 this morning. Is there take or do to get this down. Pt has a headache now. Please advise.

## 2013-11-17 NOTE — Telephone Encounter (Signed)
I did not see him for this but  I know Dr. Darnell Level started Lisinopril 20 mg daily on the 15th.  He needs to be seen again to adjust his medication.  Please make an appt for him tomorrow.

## 2013-11-17 NOTE — Telephone Encounter (Signed)
Spoke to pt and advised per Dr Deborra Medina. Dr Deborra Medina and Dr Danise Mina both out of office on 10/23; pt prefers to see one of them, appt sched for 10/26

## 2013-11-21 ENCOUNTER — Ambulatory Visit: Payer: Managed Care, Other (non HMO) | Admitting: Family Medicine

## 2013-11-21 DIAGNOSIS — Z0289 Encounter for other administrative examinations: Secondary | ICD-10-CM

## 2013-11-30 ENCOUNTER — Ambulatory Visit: Payer: Managed Care, Other (non HMO) | Admitting: Family Medicine

## 2013-11-30 DIAGNOSIS — Z0289 Encounter for other administrative examinations: Secondary | ICD-10-CM

## 2013-12-01 ENCOUNTER — Telehealth: Payer: Self-pay | Admitting: Family Medicine

## 2013-12-01 NOTE — Telephone Encounter (Signed)
Yes he should follow up.

## 2013-12-01 NOTE — Telephone Encounter (Signed)
Patient did not come for their scheduled appointment 11/30/13 for 1 month follow up.  Please let me know if the patient needs to be contacted immediately for follow up or if no follow up is necessary.

## 2013-12-02 NOTE — Telephone Encounter (Signed)
Left message asking pt to call office  °

## 2013-12-05 NOTE — Telephone Encounter (Signed)
Appointment 11/24 pt aware

## 2013-12-05 NOTE — Telephone Encounter (Signed)
Left message asking pt to call office  °

## 2013-12-08 ENCOUNTER — Telehealth: Payer: Self-pay

## 2013-12-08 MED ORDER — MELOXICAM 7.5 MG PO TABS: ORAL_TABLET | ORAL | Status: DC

## 2013-12-08 NOTE — Telephone Encounter (Signed)
Jeff Ward from [facility] left v/m requesting refill meloxicam. Pt no longer uses mail order pharmacy and pts wife had requested refill done today. Dr Deborra Medina out of office until 12/12/13.Please advise.

## 2013-12-08 NOTE — Telephone Encounter (Signed)
[facility]. Future refills from PCP.

## 2013-12-09 NOTE — Telephone Encounter (Signed)
Mrs Steinmeyer left v/m; Mr and Mrs Fullen had to go out of town today and request cb about meloxicam being filled at pharmacy on the way to [place]. Unable to reach pt or pts wife to let them know pharmacy in [place] can get refill transferred from [place].

## 2013-12-09 NOTE — Telephone Encounter (Signed)
Attempted to call patient. No answer, no voicemail set up

## 2013-12-13 ENCOUNTER — Ambulatory Visit: Payer: Managed Care, Other (non HMO) | Admitting: Family Medicine

## 2013-12-20 ENCOUNTER — Ambulatory Visit (INDEPENDENT_AMBULATORY_CARE_PROVIDER_SITE_OTHER): Payer: Managed Care, Other (non HMO) | Admitting: Family Medicine

## 2013-12-20 ENCOUNTER — Encounter: Payer: Self-pay | Admitting: Family Medicine

## 2013-12-20 ENCOUNTER — Telehealth: Payer: Self-pay | Admitting: Family Medicine

## 2013-12-20 VITALS — BP 136/76 | HR 54 | Temp 98.0°F | Wt 193.5 lb

## 2013-12-20 DIAGNOSIS — E291 Testicular hypofunction: Secondary | ICD-10-CM

## 2013-12-20 DIAGNOSIS — R945 Abnormal results of liver function studies: Secondary | ICD-10-CM

## 2013-12-20 DIAGNOSIS — R739 Hyperglycemia, unspecified: Secondary | ICD-10-CM

## 2013-12-20 DIAGNOSIS — E349 Endocrine disorder, unspecified: Secondary | ICD-10-CM

## 2013-12-20 DIAGNOSIS — I10 Essential (primary) hypertension: Secondary | ICD-10-CM

## 2013-12-20 DIAGNOSIS — E785 Hyperlipidemia, unspecified: Secondary | ICD-10-CM

## 2013-12-20 DIAGNOSIS — R7989 Other specified abnormal findings of blood chemistry: Secondary | ICD-10-CM

## 2013-12-20 MED ORDER — FENOFIBRATE 145 MG PO TABS: ORAL_TABLET | ORAL | Status: DC

## 2013-12-20 MED ORDER — TADALAFIL 10 MG PO TABS: 10.0000 mg | ORAL_TABLET | Freq: Every day | ORAL | Status: DC | PRN

## 2013-12-20 MED ORDER — DULOXETINE HCL 30 MG PO CPEP: 30.0000 mg | ORAL_CAPSULE | Freq: Every day | ORAL | Status: DC

## 2013-12-20 NOTE — Assessment & Plan Note (Signed)
Well controlled on current rx. No changes made today. Will check labs today. 

## 2013-12-20 NOTE — Progress Notes (Signed)
Pre visit review using our clinic review tool, if applicable. No additional management support is needed unless otherwise documented below in the visit note. 

## 2013-12-20 NOTE — Telephone Encounter (Signed)
I'm unsure of the context to fill these forms out. Nothing in pt's chart supporting a need for accommodation/work restrictions. Pt was seen today 12/20/13 for headaches. I am unable to make these calls regarding pt's restrictions.  Forms in [place] in box.

## 2013-12-20 NOTE — Progress Notes (Signed)
Subjective:   Patient ID: Jeff Ward, male    DOB: [birth year], 63 y.o.   MRN: [id number]  Jeff Ward is a pleasant 63 y.o. year old male who presents to clinic today with Follow-up  on 12/20/2013  HPI:   Quit smoking and drinking.  Wife just went through detox and AA.  He is still working.  Feels he is coping ok.  Lab Results  Component Value Date   PSA 0.8 04/20/2013   PSA 0.6 05/21/2012   PSA 0.7 05/20/2006    HLD- on Tricor 145 mg daily.  Cholesterol has been controlled. Lab Results  Component Value Date   HDL 45 04/20/2013   LDLCALC 112* 04/20/2013   TRIG 172* 04/20/2013   CHOLHDL 4.2 04/20/2013   HTN- has been controlled on lisinopril 20 mg daily. He did not take his blood pressure medication this morning. BP Readings from Last 3 Encounters:  12/20/13 136/76  11/10/13 180/90  07/19/13 130/69   Working at [employer] and loves it!  Walking a lot on concrete floors.  Have some LE edema at end of the day.  No LE pain or erythema.  Patient Active Problem List   Diagnosis Date Noted  . Headache 11/10/2013  . Personal history of colonic polyps 04/27/2013  . Leg edema 04/27/2013  . Multiple nevi 04/27/2013  . Depression 02/17/2013  . Hyperglycemia 07/23/2011  . OSA (obstructive sleep apnea) 04/10/2011  . Testosterone deficiency 03/06/2011  . Night sweats 05/09/2010  . Degenerative disc disease, lumbar 05/09/2010  . BLOOD IN STOOL 11/15/2009  . OTHER NONSPECIFIC ABNORMAL SERUM ENZYME LEVELS 05/23/2009  . LEG CRAMPS 05/15/2009  . PALPITATIONS 05/15/2009  . LUMBAR STRAIN 11/28/2008  . Essential hypertension, benign 09/01/2007  . COLONIC POLYPS 10/23/2006  . HLD (hyperlipidemia) 10/23/2006  . PUD 10/23/2006  . ARTHRITIS, GENERALIZED 10/23/2006  . CARPAL TUNNEL SYNDROME, BILATERAL, HX OF 10/23/2006  . BENIGN PROSTATIC HYPERTROPHY, HX OF 10/23/2006  . SHOULDER PAIN, RIGHT 10/21/2006  . LIVER FUNCTION TESTS, ABNORMAL 06/04/2006   Past Medical History    Diagnosis Date  . 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 Hyperlipidemia 06/2002  . Hypertension   . Arthritis     In back and all joints   Past Surgical History  Procedure Laterality Date  . Carpal tunnel release  2005     / right hand  . Tonsillectomy and adenoidectomy      63 years old   History  Substance Use Topics  . Smoking status: Former Smoker -- 35 years    Types: Cigars    Quit date: 04/02/2010  . Smokeless tobacco: Never Used     Comment: smoked cigars 1 to 2 daily  . Alcohol Use: No     Comment: 2-3 beers a day   Family History  Problem Relation Age of Onset  . Cancer Father     history of melanona  . Cancer Paternal Aunt     breast  . Alcohol abuse Maternal Grandfather    Allergies  Allergen Reactions  . Nutritional Supplements   . Sulfonamide Derivatives     REACTION: unconsciousness/dizziness   Current Outpatient Prescriptions on File Prior to Visit  Medication Sig Dispense Refill  . diclofenac (VOLTAREN) 75 MG EC tablet Take 1 tablet by mouth  twice a day 180 tablet 1  . EPINEPHrine (EPI-PEN) 0.3 mg/0.3 mL SOAJ injection Inject 0.3 mLs (0.3 mg total) into the muscle once. 1 Device 1  .  glucosamine-chondroitin 500-400 MG tablet Take 1 tablet by mouth 2 (two) times daily.      . lansoprazole (PREVACID) 30 MG capsule Take 1 capsule by mouth  daily 90 capsule 1  . lisinopril (PRINIVIL,ZESTRIL) 20 MG tablet Take 1 tablet by mouth  daily 90 tablet 3  . meloxicam (MOBIC) 7.5 MG tablet Take 1 tablet by mouth  twice a day 30 tablet 3  . Multiple Vitamin (MULTIVITAMIN) tablet Take 1 tablet by mouth daily. Mens MVI-Take one daily    . Potassium 99 MG TABS Take 1 tablet by mouth 2 (two) times daily.       No current facility-administered medications on file prior to visit.   The PMH, PSH, Social History, Family History, Medications, and allergies have been reviewed in [facility], and have been updated if relevant.    Review of Systems See  HPI Denies blood in stool No changes in bowel habits No abdominal pain No CP or SOB No anxiety or depression No DOE Wt Readings from Last 3 Encounters:  12/20/13 193 lb 8 oz (87.771 kg)  11/10/13 198 lb 4 oz (89.926 kg)  07/19/13 193 lb (87.544 kg)       Objective:    BP 136/76 mmHg  Pulse 54  Temp(Src) 98 F (36.7 C) (Oral)  Wt 193 lb 8 oz (87.771 kg)  SpO2 97%   Physical Exam  General:  overweght male in NAD Eyes:  PERRL Ears:  External ear exam shows no significant lesions or deformities.  Otoscopic examination reveals clear canals, tympanic membranes are intact bilaterally without bulging, retraction, inflammation or discharge. Hearing is grossly normal bilaterally. Nose:  External nasal examination shows no deformity or inflammation. Nasal mucosa are pink and moist without lesions or exudates. Mouth:  Oral mucosa and oropharynx without lesions or exudates.  Teeth in good repair. Neck:  no carotid bruit or thyromegaly no cervical or supraclavicular lymphadenopathy  Lungs:  Normal respiratory effort, chest expands symmetrically. Lungs are clear to auscultation, no crackles or wheezes. Heart:  Normal rate and regular rhythm. S1 and S2 normal without gallop, murmur, click, rub or other extra sounds. Abdomen:  Bowel sounds positive,abdomen soft and non-tender without masses, organomegaly or hernias noted. Pulses:  R and L posterior tibial pulses are full and equal bilaterally  Extremities:  no edema         Assessment & Plan:   HLD (hyperlipidemia) - Plan: CBC with Differential, Lipid panel, Comprehensive metabolic panel, Hemoglobin A1c, Testosterone, CANCELED: Lipid panel, CANCELED: Comprehensive metabolic panel, CANCELED: CBC with Differential  Essential hypertension, benign - Plan: CBC with Differential, Lipid panel, Comprehensive metabolic panel, Hemoglobin A1c, Testosterone  Hyperglycemia - Plan: CBC with Differential, Lipid panel, Comprehensive metabolic panel,  Hemoglobin A1c, Testosterone, CANCELED: Hemoglobin A1c  Low testosterone - Plan: CBC with Differential, Lipid panel, Comprehensive metabolic panel, Hemoglobin A1c, Testosterone, CANCELED: Testosterone  Testosterone deficiency No Follow-up on file.

## 2013-12-20 NOTE — Patient Instructions (Signed)
Happy Thanksgiving. Hang in there.  We will call you with your lab results.

## 2013-12-20 NOTE — Assessment & Plan Note (Signed)
Continue current rx. Check labs today. 

## 2013-12-20 NOTE — Telephone Encounter (Signed)
Pt dropped off form for dr Deborra Medina to fill out In box

## 2013-12-20 NOTE — Assessment & Plan Note (Signed)
Likely resolved now that he is abstaining from ETOH. Will recheck liver function today.

## 2013-12-20 NOTE — Telephone Encounter (Signed)
Spoke to pt who states that forms were to be completed for weight lifting restrictions at work, due to having a "bad back." Advised pt that Dr Deborra Medina has never seen him for back pain, which pt confirmed. Advised pt, that per Dr Deborra Medina, she is not able to complete forms for a Dx she has not seen him for. Pt states "not to worry about the forms, just throw them away" and that his job would either "take his word, or fire him." Pt request paperwork be disregarded; shredded.

## 2013-12-21 ENCOUNTER — Encounter: Payer: Self-pay | Admitting: *Deleted

## 2013-12-21 LAB — LIPID PANEL
CHOL/HDL RATIO: 5.5 ratio — AB (ref 0.0–5.0)
Cholesterol, Total: 193 mg/dL (ref 100–199)
HDL: 35 mg/dL — AB (ref >39)
LDL CALC: 133 mg/dL — AB (ref 0–99)
TRIGLYCERIDES: 126 mg/dL (ref 0–149)
VLDL Cholesterol Cal: 25 mg/dL (ref 5–40)

## 2013-12-21 LAB — COMPREHENSIVE METABOLIC PANEL
A/G RATIO: 1.7 (ref 1.1–2.5)
ALT: 67 IU/L — ABNORMAL HIGH (ref 0–44)
AST: 64 IU/L — AB (ref 0–40)
Albumin: 4.3 g/dL (ref 3.6–4.8)
Alkaline Phosphatase: 66 IU/L (ref 39–117)
BUN/Creatinine Ratio: 19 (ref 10–22)
BUN: 19 mg/dL (ref 8–27)
CO2: 25 mmol/L (ref 18–29)
Calcium: 9.5 mg/dL (ref 8.6–10.2)
Chloride: 99 mmol/L (ref 97–108)
Creatinine, Ser: 1.02 mg/dL (ref 0.76–1.27)
GFR calc Af Amer: 89 mL/min/{1.73_m2} (ref >59)
GFR, EST NON AFRICAN AMERICAN: 77 mL/min/{1.73_m2} (ref >59)
Globulin, Total: 2.5 g/dL (ref 1.5–4.5)
Glucose: 106 mg/dL — ABNORMAL HIGH (ref 65–99)
POTASSIUM: 4.6 mmol/L (ref 3.5–5.2)
SODIUM: 140 mmol/L (ref 134–144)
Total Bilirubin: 0.4 mg/dL (ref 0.0–1.2)
Total Protein: 6.8 g/dL (ref 6.0–8.5)

## 2013-12-21 LAB — CBC WITH DIFFERENTIAL/PLATELET
Basophils Absolute: 0 10*3/uL (ref 0.0–0.2)
Basos: 1 %
EOS: 8 %
Eosinophils Absolute: 0.4 10*3/uL (ref 0.0–0.4)
HEMATOCRIT: 39.2 % (ref 37.5–51.0)
Hemoglobin: 13.6 g/dL (ref 12.6–17.7)
Immature Grans (Abs): 0 10*3/uL (ref 0.0–0.1)
Immature Granulocytes: 0 %
LYMPHS ABS: 1.7 10*3/uL (ref 0.7–3.1)
Lymphs: 35 %
MCH: 32.8 pg (ref 26.6–33.0)
MCHC: 34.7 g/dL (ref 31.5–35.7)
MCV: 95 fL (ref 79–97)
Monocytes Absolute: 0.6 10*3/uL (ref 0.1–0.9)
Monocytes: 11 %
Neutrophils Absolute: 2.2 10*3/uL (ref 1.4–7.0)
Neutrophils Relative %: 45 %
RBC: 4.15 x10E6/uL (ref 4.14–5.80)
RDW: 13 % (ref 12.3–15.4)
WBC: 5 10*3/uL (ref 3.4–10.8)

## 2013-12-21 LAB — TESTOSTERONE: TESTOSTERONE: 453 ng/dL (ref 348–1197)

## 2013-12-21 LAB — HEMOGLOBIN A1C
Est. average glucose Bld gHb Est-mCnc: 126 mg/dL
Hgb A1c MFr Bld: 6 % — ABNORMAL HIGH (ref 4.8–5.6)

## 2014-02-09 ENCOUNTER — Ambulatory Visit (INDEPENDENT_AMBULATORY_CARE_PROVIDER_SITE_OTHER): Payer: Managed Care, Other (non HMO) | Admitting: Family Medicine

## 2014-02-09 ENCOUNTER — Encounter: Payer: Self-pay | Admitting: Family Medicine

## 2014-02-09 VITALS — BP 134/76 | HR 62 | Temp 98.4°F | Wt 195.5 lb

## 2014-02-09 DIAGNOSIS — H8110 Benign paroxysmal vertigo, unspecified ear: Secondary | ICD-10-CM

## 2014-02-09 NOTE — Patient Instructions (Signed)
Vertigo, aka BPV.  Take meclizine as needed and use the bedside exercise if you have more symptoms.  Take care.

## 2014-02-09 NOTE — Assessment & Plan Note (Signed)
He had clockwise spinning with both episodes.  Likely vertigo dx, not a BP issue.  No neuro sx o/w, no CP, no syncope.  No sx now, resolved.  Path/phys d/w pt.  Can use meclizine and home bedside exercises prn.  D/w pt.  He agrees.  Fu prn.

## 2014-02-09 NOTE — Progress Notes (Signed)
Pre visit review using our clinic review tool, if applicable. No additional management support is needed unless otherwise documented below in the visit note.  2 weeks ago was out of town, in [place].  He got dizzy, checked BP- 120/50.  Room spinning, no syncope.  Ate a snack and sx resolved.  Then this past week, he had another episode, similar sx but worse.  Lasted up to 3 hours.  He may have turned his head at the time.  No ear ringing. No hearing loss.  No FCNAVD.  Not lightheaded now.  No trouble rolling over in the bed.  No focal motor changes with the episodes or now.    Meds, vitals, and allergies reviewed.   ROS: See HPI.  Otherwise, noncontributory.  GEN: nad, alert and oriented HEENT: mucous membranes moist, tm wnl nasal and OP exam wnl NECK: supple w/o LA CV: rrr.  no murmur PULM: ctab, no inc wob ABD: soft, +bs EXT: no edema CN 2-12 wnl B, S/S/DTR wnl x4 DHP neg

## 2014-02-10 ENCOUNTER — Telehealth: Payer: Self-pay | Admitting: Family Medicine

## 2014-02-10 ENCOUNTER — Telehealth: Payer: Self-pay

## 2014-02-10 NOTE — Telephone Encounter (Signed)
Pt's wife came in with a sealed envelope this afternoon asking for rena.  The envelope contained a form for pt's work, [employer].  I saw after she left that it is a completely blank form, no name or any information on it.  Placing on waynetta's desk. mf

## 2014-02-10 NOTE — Telephone Encounter (Signed)
Placed in Dr Hulen Shouts inbox for review

## 2014-02-10 NOTE — Telephone Encounter (Signed)
Mrs Kelemen said pt works at [employer]  in [place] now,pt has hx of arthritis and bone spurs and disc problems in back. [employer] is requesting a form to be filled out of pt is able to perform certain duties at [employer]. Pt has difficulty lifting 50 lbs or more. Mrs Robbins will bring form to office for Dr Hulen Shouts today for Dr Hulen Shouts review next week. Mrs Dieujuste is including a letter as well.

## 2014-02-13 NOTE — Telephone Encounter (Signed)
Form placed in box for Ebony Hail to complete and I will sign upon completion.

## 2014-02-13 NOTE — Telephone Encounter (Signed)
Forms given to [employer]. I am unable to fill out work restrictions. I only help with FMLA and Short Term Disability

## 2014-02-14 NOTE — Telephone Encounter (Signed)
Per Dr Deborra Medina, we are unable to complete form. Pt has similar form at last OV and was advised we would not be able to complete. Lm on pts vm requesting a call back

## 2014-02-15 NOTE — Telephone Encounter (Signed)
Spoke to pt and informed him per Dr Deborra Medina, she is unable to complete work form

## 2014-03-17 NOTE — Telephone Encounter (Signed)
Spoke to pt and informed him that if he would like to drop off the form for Dr Deborra Medina to review, that would be fine. Pt was advised that if this is the same form that he has presented previously, Dr Deborra Medina will not complete it. She has denied completion twice before based on information required that she can not verify or confirm.

## 2014-03-17 NOTE — Telephone Encounter (Addendum)
Pt left v/m; pt has previously requested form to be filled out for pt about lifting at work; pt wants to know if he brings form to office today if it can be filled out. Pt request cb.

## 2014-03-18 ENCOUNTER — Other Ambulatory Visit: Payer: Self-pay | Admitting: Family Medicine

## 2014-03-20 ENCOUNTER — Telehealth: Payer: Self-pay | Admitting: Family Medicine

## 2014-03-20 NOTE — Telephone Encounter (Signed)
Spoke to pt and informed him that this is the same paperwork he previously requested to be completed, and unfortunately, Dr Deborra Medina will not be able to complete.

## 2014-03-20 NOTE — Telephone Encounter (Signed)
Pt's wife dropped off form for employment accomodation.  Pt prefers to have form faxed directly if possible.  The fax number is [phone]. Thanks

## 2014-03-20 NOTE — Telephone Encounter (Signed)
See 02/10/14 phone note addendum

## 2014-03-21 ENCOUNTER — Encounter: Payer: Self-pay | Admitting: Family Medicine

## 2014-03-21 NOTE — Telephone Encounter (Signed)
Noted! Thank you

## 2014-03-21 NOTE — Telephone Encounter (Signed)
Mrs Neyra left v/m requesting cb from office manager re; to form pt has requested to be filled out for pts work; Mrs Alvizo said this is a simple form of 3 questions. Mrs Redwine request cb from office mgr.

## 2014-03-21 NOTE — Telephone Encounter (Signed)
Spoke with wife Jeff Ward at [employer] re: her desire to have [employer] form completed,  concerning her husband's inablilty to lift 50 lb and his back pain.  Explained that Dr. Deborra Medina had not seen patient for back pain and the 3 questions asked on form required a detailed analysis of the current problems her husband is having.  After additional discussion she informed that their insurance had changed to a [employer] / [employer].  Jeff Ward is a patient of record with [facility] orthopedics.  She is concerned with cost, so is going to contact previous orthopedic provider at [facility] for back evaluation and possible completion of form.  Returned form via mail with an office letter from our office.    She or patient will call me if any concerns.

## 2014-03-31 ENCOUNTER — Other Ambulatory Visit: Payer: Self-pay

## 2014-03-31 MED ORDER — LANSOPRAZOLE 30 MG PO CPDR: DELAYED_RELEASE_CAPSULE | ORAL | Status: DC

## 2014-03-31 NOTE — Telephone Encounter (Signed)
Pt request [facility]; advised pt done. Pt has been taking med regularly.

## 2014-04-14 ENCOUNTER — Other Ambulatory Visit: Payer: Self-pay | Admitting: *Deleted

## 2014-04-14 MED ORDER — MELOXICAM 7.5 MG PO TABS: ORAL_TABLET | ORAL | Status: DC

## 2014-04-14 MED ORDER — DICLOFENAC SODIUM 75 MG PO TBEC: DELAYED_RELEASE_TABLET | ORAL | Status: DC

## 2014-04-14 MED ORDER — LISINOPRIL 20 MG PO TABS: ORAL_TABLET | ORAL | Status: DC

## 2014-04-14 NOTE — Telephone Encounter (Signed)
90D supply not due for medications not filled

## 2014-04-18 MED ORDER — DICLOFENAC SODIUM 75 MG PO TBEC: DELAYED_RELEASE_TABLET | ORAL | Status: DC

## 2014-04-18 MED ORDER — LISINOPRIL 20 MG PO TABS: ORAL_TABLET | ORAL | Status: DC

## 2014-04-18 MED ORDER — MELOXICAM 7.5 MG PO TABS: ORAL_TABLET | ORAL | Status: DC

## 2014-04-18 NOTE — Addendum Note (Signed)
Addended by: Modena Nunnery on: 04/18/2014 01:05 PM   Modules accepted: Orders

## 2014-05-09 ENCOUNTER — Telehealth: Payer: Self-pay | Admitting: *Deleted

## 2014-05-09 NOTE — Telephone Encounter (Signed)
Please left another message voicemail stating that he is running low on the medication and wants this done in a timely manner. Patient stated that his insurance company stated that you need to call them with these refills.

## 2014-05-09 NOTE — Telephone Encounter (Signed)
Request was sent via fax and received on today. Medication refill turn around time is 24-48hours

## 2014-05-09 NOTE — Telephone Encounter (Signed)
Patient left a voicemail stating that he has changed insurance companies and needs a 90 day supply of all of his medications sent to [facility].

## 2014-05-10 MED ORDER — LISINOPRIL 20 MG PO TABS: ORAL_TABLET | ORAL | Status: DC

## 2014-05-10 MED ORDER — DICLOFENAC SODIUM 75 MG PO TBEC: DELAYED_RELEASE_TABLET | ORAL | Status: DC

## 2014-05-10 MED ORDER — DULOXETINE HCL 30 MG PO CPEP: ORAL_CAPSULE | ORAL | Status: DC

## 2014-05-10 MED ORDER — LANSOPRAZOLE 30 MG PO CPDR: DELAYED_RELEASE_CAPSULE | ORAL | Status: DC

## 2014-05-10 NOTE — Telephone Encounter (Signed)
Attempted to contact pt; unable to leave vm. Pt unable to have #90 of lipid medication as he is needing OV and labs. Requesting to know if pt is wanting Rx sent to local pharmacy until he can be seen

## 2014-05-11 NOTE — Telephone Encounter (Signed)
Lm on pts vm requesting a call back 

## 2014-05-31 ENCOUNTER — Telehealth: Payer: Self-pay

## 2014-05-31 NOTE — Telephone Encounter (Signed)
Pt left v/m requesting 90 day meloxicam 15 mg taking 1 tab po twice a day sent to [facility] with 1 years refill. Med list has meloxicam 7.5 mg taking one tab twice a day.Please advise.

## 2014-05-31 NOTE — Telephone Encounter (Signed)
I am not comfortable prescribing such a high dose of an NSAID for 1 year.  We can certainly send in rx for 7.5 mg twice daily prn.

## 2014-06-01 NOTE — Telephone Encounter (Signed)
Lm on pts vm requesting a call back 

## 2014-06-02 MED ORDER — MELOXICAM 7.5 MG PO TABS: ORAL_TABLET | ORAL | Status: DC

## 2014-06-02 NOTE — Telephone Encounter (Signed)
Spoke to pt and advised per Dr Aron. Rx sent to requested pharmacy 

## 2014-06-02 NOTE — Addendum Note (Signed)
Addended by: Modena Nunnery on: 06/02/2014 08:43 AM   Modules accepted: Orders

## 2014-06-07 ENCOUNTER — Ambulatory Visit (INDEPENDENT_AMBULATORY_CARE_PROVIDER_SITE_OTHER): Payer: No Typology Code available for payment source | Admitting: Family Medicine

## 2014-06-07 ENCOUNTER — Encounter: Payer: Self-pay | Admitting: Family Medicine

## 2014-06-07 VITALS — BP 148/90 | HR 69 | Temp 97.8°F | Wt 199.8 lb

## 2014-06-07 DIAGNOSIS — Z638 Other specified problems related to primary support group: Secondary | ICD-10-CM | POA: Diagnosis not present

## 2014-06-07 DIAGNOSIS — I10 Essential (primary) hypertension: Secondary | ICD-10-CM | POA: Diagnosis not present

## 2014-06-07 DIAGNOSIS — R229 Localized swelling, mass and lump, unspecified: Secondary | ICD-10-CM | POA: Insufficient documentation

## 2014-06-07 DIAGNOSIS — F439 Reaction to severe stress, unspecified: Secondary | ICD-10-CM

## 2014-06-07 NOTE — Progress Notes (Signed)
Subjective:   Patient ID: Jeff Ward, male    DOB: 06-07-1950, 64 y.o.   MRN: [id number]  Jeff Ward is a pleasant 64 y.o. year old male who presents to clinic today with rib cage pain  on 06/07/2014  HPI:  "lumps" under his skin on both sides of his lateral rib cage for over two years. Came in today because he is not sure what they are.  He does not think they have grown in size.  Not painful when he takes deep breaths.  Can be painful if he presses hard on them.  Blood pressure very elevated initially - 189/95.  Improved to 148/90 after we talked for a little while and rechecked it.  Has not taken his Lisinopril today and has been under a lot of stress.  His wife is now completely disabled.  He is not sleeping well since he has to help her at night.  She has fallen multiple times at home.  He is the only one caring for her and he is working full time. Denies HA or blurred vision.  Did feel "whooshing" in his ears this morning.  BP Readings from Last 3 Encounters:  06/07/14 148/90  02/09/14 134/76  12/20/13 136/76   Current Outpatient Prescriptions on File Prior to Visit  Medication Sig Dispense Refill  . diclofenac (VOLTAREN) 75 MG EC tablet Take 1 tablet by mouth  twice a day 180 tablet 1  . DULoxetine (CYMBALTA) 30 MG capsule TAKE 1 CAPSULE (30 MG TOTAL) BY MOUTH DAILY. 90 capsule 1  . EPINEPHrine (EPI-PEN) 0.3 mg/0.3 mL SOAJ injection Inject 0.3 mLs (0.3 mg total) into the muscle once. 1 Device 1  . fenofibrate (TRICOR) 145 MG tablet TAKE 1 TABLET BY MOUTH DAILY 90 tablet 0  . glucosamine-chondroitin 500-400 MG tablet Take 1 tablet by mouth 2 (two) times daily.      . lansoprazole (PREVACID) 30 MG capsule Take 1 capsule by mouth  daily 90 capsule 1  . lisinopril (PRINIVIL,ZESTRIL) 20 MG tablet Take 1 tablet by mouth  daily 90 tablet 1  . meclizine (ANTIVERT) 12.5 MG tablet Take 12.5-25 mg by mouth 3 (three) times daily as needed for dizziness (sedation caution).    .  meloxicam (MOBIC) 7.5 MG tablet Take 1 tablet by mouth  twice a day 180 tablet 2  . Multiple Vitamin (MULTIVITAMIN) tablet Take 1 tablet by mouth daily. Mens MVI-Take one daily    . Potassium 99 MG TABS Take 1 tablet by mouth 2 (two) times daily.       No current facility-administered medications on file prior to visit.    Allergies  Allergen Reactions  . Nutritional Supplements   . Sulfonamide Derivatives     REACTION: unconsciousness/dizziness    Past Medical History  Diagnosis Date  . 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 Hyperlipidemia 06/2002  . Hypertension   . Arthritis     In back and all joints    Past Surgical History  Procedure Laterality Date  . Carpal tunnel release  2005     / right hand  . Tonsillectomy and adenoidectomy      64 years old    Family History  Problem Relation Age of Onset  . Cancer Father     history of melanona  . Cancer Paternal Aunt     breast  . Alcohol abuse Maternal Grandfather     History   Social History  . Marital  Status: Married    Spouse Name: N/A  . Number of Children: 2  . Years of Education: N/A   Occupational History  . [employer]-Maintence     currently unemployed  .     Social History Main Topics  . Smoking status: Former Smoker -- 35 years    Types: Cigars    Quit date: 04/02/2010  . Smokeless tobacco: Never Used     Comment: smoked cigars 1 to 2 daily  . Alcohol Use: No     Comment: 2-3 beers a day  . Drug Use: No  . Sexual Activity:    Partners: Female     Comment: declined condoms   Other Topics Concern  . Not on file   Social History Narrative   The PMH, PSH, Social History, Family History, Medications, and allergies have been reviewed in [facility], and have been updated if relevant.  Review of Systems  Constitutional: Negative.   HENT: Negative.   Respiratory: Negative.   Cardiovascular: Negative.   Gastrointestinal: Negative.   Musculoskeletal: Negative.     Skin: Negative.   Allergic/Immunologic: Negative.   Hematological: Negative.   Psychiatric/Behavioral: Positive for sleep disturbance. The patient is nervous/anxious.   All other systems reviewed and are negative.      Objective:    BP 148/90 mmHg  Pulse 69  Temp(Src) 97.8 F (36.6 C) (Oral)  Wt 199 lb 12 oz (90.606 kg)  SpO2 97%   Physical Exam  Constitutional: He is oriented to person, place, and time. He appears well-developed and well-nourished.  HENT:  Head: Normocephalic and atraumatic.  Eyes: Conjunctivae are normal.  Neck: Normal range of motion.  Cardiovascular: Normal rate and regular rhythm.   Pulmonary/Chest: Effort normal and breath sounds normal.  Neurological: He is alert and oriented to person, place, and time. No cranial nerve deficit.  Skin: Skin is warm and dry.     Psychiatric: He has a normal mood and affect. His behavior is normal. Judgment and thought content normal.  Nursing note and vitals reviewed.         Assessment & Plan:   Essential hypertension, benign  Subcutaneous mass No Follow-up on file.

## 2014-06-07 NOTE — Assessment & Plan Note (Signed)
New- ?lipomas.  He declines any further work up--mainly "just wanted you to know about them, they have been there for years."

## 2014-06-07 NOTE — Progress Notes (Signed)
Pre visit review using our clinic review tool, if applicable. No additional management support is needed unless otherwise documented below in the visit note. 

## 2014-06-07 NOTE — Assessment & Plan Note (Signed)
Very elevated upon arrival but improved after he had time to relax and take deep breaths.  He is under significant stressors at home and has not taken his Lisinopril today. Offered my support and advised him to take his Lisinopril as soon as he gets home and to keep me updated. The patient indicates understanding of these issues and agrees with the plan.

## 2014-06-07 NOTE — Patient Instructions (Signed)
Good to see you, Jeff Ward. Please take your blood pressure medication ( Lisinopril) as soon as you get home.

## 2014-08-26 ENCOUNTER — Emergency Department
Admission: EM | Admit: 2014-08-26 | Discharge: 2014-08-26 | Disposition: A | Discharge status: Home / Self Care | Payer: Medicare Other | Attending: Emergency Medicine | Admitting: Emergency Medicine

## 2014-08-26 ENCOUNTER — Emergency Department: Payer: Medicare Other

## 2014-08-26 ENCOUNTER — Other Ambulatory Visit: Payer: Self-pay

## 2014-08-26 DIAGNOSIS — R42 Dizziness and giddiness: Secondary | ICD-10-CM | POA: Diagnosis not present

## 2014-08-26 DIAGNOSIS — Z87891 Personal history of nicotine dependence: Secondary | ICD-10-CM | POA: Insufficient documentation

## 2014-08-26 DIAGNOSIS — Z791 Long term (current) use of non-steroidal anti-inflammatories (NSAID): Secondary | ICD-10-CM | POA: Insufficient documentation

## 2014-08-26 DIAGNOSIS — I159 Secondary hypertension, unspecified: Secondary | ICD-10-CM | POA: Diagnosis not present

## 2014-08-26 DIAGNOSIS — R0989 Other specified symptoms and signs involving the circulatory and respiratory systems: Secondary | ICD-10-CM | POA: Diagnosis not present

## 2014-08-26 DIAGNOSIS — R0981 Nasal congestion: Secondary | ICD-10-CM | POA: Insufficient documentation

## 2014-08-26 DIAGNOSIS — I10 Essential (primary) hypertension: Secondary | ICD-10-CM | POA: Insufficient documentation

## 2014-08-26 DIAGNOSIS — R110 Nausea: Secondary | ICD-10-CM | POA: Diagnosis not present

## 2014-08-26 DIAGNOSIS — H81392 Other peripheral vertigo, left ear: Secondary | ICD-10-CM

## 2014-08-26 LAB — URINALYSIS COMPLETE WITH MICROSCOPIC (ARMC ONLY)
Bilirubin Urine: NEGATIVE
Glucose, UA: NEGATIVE mg/dL
HGB URINE DIPSTICK: NEGATIVE
KETONES UR: NEGATIVE mg/dL
Leukocytes, UA: NEGATIVE
NITRITE: NEGATIVE
PH: 6 (ref 5.0–8.0)
Protein, ur: 30 mg/dL — AB
SPECIFIC GRAVITY, URINE: 1.017 (ref 1.005–1.030)

## 2014-08-26 LAB — BASIC METABOLIC PANEL
Anion gap: 9 (ref 5–15)
BUN: 19 mg/dL (ref 6–20)
CO2: 29 mmol/L (ref 22–32)
Calcium: 9.5 mg/dL (ref 8.9–10.3)
Chloride: 98 mmol/L — ABNORMAL LOW (ref 101–111)
Creatinine, Ser: 0.96 mg/dL (ref 0.61–1.24)
GLUCOSE: 121 mg/dL — AB (ref 65–99)
Potassium: 4.6 mmol/L (ref 3.5–5.1)
Sodium: 136 mmol/L (ref 135–145)

## 2014-08-26 LAB — CBC
HCT: 40.9 % (ref 40.0–52.0)
HEMOGLOBIN: 14.1 g/dL (ref 13.0–18.0)
MCH: 34.3 pg — ABNORMAL HIGH (ref 26.0–34.0)
MCHC: 34.3 g/dL (ref 32.0–36.0)
MCV: 100 fL (ref 80.0–100.0)
Platelets: 163 10*3/uL (ref 150–440)
RBC: 4.1 MIL/uL — AB (ref 4.40–5.90)
RDW: 13.9 % (ref 11.5–14.5)
WBC: 7.2 10*3/uL (ref 3.8–10.6)

## 2014-08-26 MED ORDER — HYDRALAZINE HCL 25 MG PO TABS
ORAL_TABLET | ORAL | Status: AC
  Filled 2014-08-26: qty 1

## 2014-08-26 MED ORDER — MECLIZINE HCL 25 MG PO TABS: 25.0000 mg | ORAL_TABLET | Freq: Three times a day (TID) | ORAL | Status: DC | PRN

## 2014-08-26 MED ORDER — LISINOPRIL 20 MG PO TABS
10.0000 mg | ORAL_TABLET | Freq: Once | ORAL | Status: AC
  Administered 2014-08-26: 10 mg via ORAL
  Filled 2014-08-26: qty 1

## 2014-08-26 MED ORDER — HYDRALAZINE HCL 25 MG PO TABS
50.0000 mg | ORAL_TABLET | Freq: Once | ORAL | Status: AC
  Administered 2014-08-26: 50 mg via ORAL
  Filled 2014-08-26: qty 2

## 2014-08-26 NOTE — ED Provider Notes (Signed)
[facility] Emergency Department Provider Note  ____________________________________________  Time seen: Approximately 5:53 PM  I have reviewed the triage vital signs and the nursing notes.   HISTORY  Chief Complaint Dizziness    HPI Jeff Ward is a 64 y.o. male and having intermittent episodes of dizziness that seem to come on when he stands up and go away when he holds his head still for about a month. Saw his doctor who thought he likely had "loose stones" in his ear causing some dizziness. He went to urgent care today and because his blood pressure was significantly elevated they referred him to the emergency room.  States he has no symptoms at present, but intermittently will feel a spinning sensation, slightly nauseated at times. Does report feeling some congestion in his sinuses, and states he's had this along with runny nose for years each morning. No numbness or tingling, no weakness in the face, no trouble speaking, no weakness in arm or leg. No headache. No vision changes.  He does take lisinopril 5 mg daily for hypertension. He took this this morning as usual. No chest pain or trouble breathing.   Past Medical History  Diagnosis Date  . 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 Hyperlipidemia 06/2002  . Hypertension   . Arthritis     In back and all joints    Patient Active Problem List   Diagnosis Date Noted  . Subcutaneous mass 06/07/2014  . Stress at home 06/07/2014  . BPV (benign positional vertigo) 02/09/2014  . Headache 11/10/2013  . Personal history of colonic polyps 04/27/2013  . Leg edema 04/27/2013  . Multiple nevi 04/27/2013  . Depression 02/17/2013  . Hyperglycemia 07/23/2011  . OSA (obstructive sleep apnea) 04/10/2011  . Testosterone deficiency 03/06/2011  . Night sweats 05/09/2010  . Degenerative disc disease, lumbar 05/09/2010  . BLOOD IN STOOL 11/15/2009  . OTHER NONSPECIFIC ABNORMAL SERUM ENZYME  LEVELS 05/23/2009  . LEG CRAMPS 05/15/2009  . PALPITATIONS 05/15/2009  . LUMBAR STRAIN 11/28/2008  . Essential hypertension, benign 09/01/2007  . COLONIC POLYPS 10/23/2006  . HLD (hyperlipidemia) 10/23/2006  . PUD 10/23/2006  . ARTHRITIS, GENERALIZED 10/23/2006  . CARPAL TUNNEL SYNDROME, BILATERAL, HX OF 10/23/2006  . BENIGN PROSTATIC HYPERTROPHY, HX OF 10/23/2006  . SHOULDER PAIN, RIGHT 10/21/2006  . LIVER FUNCTION TESTS, ABNORMAL 06/04/2006    Past Surgical History  Procedure Laterality Date  . Carpal tunnel release  2005     / right hand  . Tonsillectomy and adenoidectomy      64 years old    Current Outpatient Rx  Name  Route  Sig  Dispense  Refill  . diclofenac (VOLTAREN) 75 MG EC tablet      Take 1 tablet by mouth  twice a day   180 tablet   1   . DULoxetine (CYMBALTA) 30 MG capsule      TAKE 1 CAPSULE (30 MG TOTAL) BY MOUTH DAILY.   90 capsule   1   . EPINEPHrine (EPI-PEN) 0.3 mg/0.3 mL SOAJ injection   Intramuscular   Inject 0.3 mLs (0.3 mg total) into the muscle once.   1 Device   1   . fenofibrate (TRICOR) 145 MG tablet      TAKE 1 TABLET BY MOUTH DAILY   90 tablet   0     Office visit with labs required for additional ref ...   . glucosamine-chondroitin 500-400 MG tablet   Oral  Take 1 tablet by mouth 2 (two) times daily.           . lansoprazole (PREVACID) 30 MG capsule      Take 1 capsule by mouth  daily   90 capsule   1   . lisinopril (PRINIVIL,ZESTRIL) 20 MG tablet      Take 1 tablet by mouth  daily   90 tablet   1   . meclizine (ANTIVERT) 25 MG tablet   Oral   Take 1 tablet (25 mg total) by mouth 3 (three) times daily as needed for dizziness or nausea.   30 tablet   0   . meloxicam (MOBIC) 7.5 MG tablet      Take 1 tablet by mouth  twice a day   180 tablet   2   . Multiple Vitamin (MULTIVITAMIN) tablet   Oral   Take 1 tablet by mouth daily. Mens MVI-Take one daily         . Potassium 99 MG TABS   Oral   Take 1  tablet by mouth 2 (two) times daily.             Allergies Nutritional supplements and Sulfonamide derivatives  Family History  Problem Relation Age of Onset  . Cancer Father     history of melanona  . Cancer Paternal Aunt     breast  . Alcohol abuse Maternal Grandfather     Social History History  Substance Use Topics  . Smoking status: Former Smoker -- 35 years    Types: Cigars    Quit date: 04/02/2010  . Smokeless tobacco: Never Used     Comment: smoked cigars 1 to 2 daily  . Alcohol Use: No     Comment: 2-3 beers a day    Review of Systems Constitutional: No fever/chills Eyes: No visual changes. ENT: No sore throat. Cardiovascular: Denies chest pain. Respiratory: Denies shortness of breath. Gastrointestinal: No abdominal pain.  No nausea, no vomiting.  No diarrhea.  No constipation. Genitourinary: Negative for dysuria. Musculoskeletal: Negative for back pain. Skin: Negative for rash. No pain in his ear. No trouble hearing. Neurological: Negative for headaches, focal weakness or numbness. No symptoms of dizziness at present, but states that they will come if lifting his head quickly. 10-point ROS otherwise negative.  ____________________________________________   PHYSICAL EXAM:  VITAL SIGNS: ED Triage Vitals  Enc Vitals Group     BP 08/26/14 1643 166/81 mmHg     Pulse Rate 08/26/14 1643 60     Resp 08/26/14 1643 20     Temp 08/26/14 1643 98.2 F (36.8 C)     Temp Source 08/26/14 1643 Oral     SpO2 08/26/14 1643 98 %     Weight 08/26/14 1643 200 lb (90.719 kg)     Height 08/26/14 1643 5\' 6"  (1.676 m)     Head Cir --      Peak Flow --      Pain Score 08/26/14 1735 1     Pain Loc --      Pain Edu? --      Excl. in [place]? --     Constitutional: Alert and oriented. Well appearing and in no acute distress. Eyes: Conjunctivae are normal. PERRL. EOMI. no red reflex, no evidence of posterior retinal hemorrhage. Head: Atraumatic. Nose: No  congestion/rhinnorhea. Mouth/Throat: Mucous membranes are moist.  Oropharynx non-erythematous. Neck: No stridor.   Cardiovascular: Normal rate, regular rhythm. Grossly normal heart sounds.  Good peripheral circulation. Respiratory:  Normal respiratory effort.  No retractions. Lungs CTAB. Gastrointestinal: Soft and nontender. No distention. No abdominal bruits. No CVA tenderness. Musculoskeletal: No lower extremity tenderness nor edema.  No joint effusions. Neurologic:  Normal speech and language. No gross focal neurologic deficits are appreciated. No gait instability. Normal cranial nerves. No further drift. 5 out of 5 strength all extremities. No slurred speech. Detailed neuro exam, no evidence of deficits. Skin:  Skin is warm, dry and intact. No rash noted. Psychiatric: Mood and affect are normal. Speech and behavior are normal.  ____________________________________________   LABS (all labs ordered are listed, but only abnormal results are displayed)  Labs Reviewed  BASIC METABOLIC PANEL - Abnormal; Notable for the following:    Chloride 98 (*)    Glucose, Bld 121 (*)    All other components within normal limits  CBC - Abnormal; Notable for the following:    RBC 4.10 (*)    MCH 34.3 (*)    All other components within normal limits  URINALYSIS COMPLETEWITH MICROSCOPIC ([facility] ONLY) - Abnormal; Notable for the following:    Color, Urine AMBER (*)    APPearance CLEAR (*)    Protein, ur 30 (*)    Bacteria, UA RARE (*)    Squamous Epithelial / LPF 0-5 (*)    All other components within normal limits   ____________________________________________  EKG  ED ECG REPORT I, QUALE, MARK, the attending physician, personally viewed and interpreted this ECG.  Date: 08/26/2014 EKG Time: 1650 Rate: 60 Rhythm: normal sinus rhythm QRS Axis: LVH criteria in aVL Intervals: normal ST/T Wave abnormalities: normal Conduction Disutrbances: none Narrative Interpretation: Normal sinus, LVH,  normal T waves no evidence acute ischemia  ____________________________________________  RADIOLOGY  CT Head Wo Contrast (Final result) Result time: 08/26/14 18:11:12   Final result by Rad Results In Interface (08/26/14 18:11:12)   Narrative:   CLINICAL DATA: Dizziness for 2 days. History of vertigo. Initial encounter.  EXAM: CT HEAD WITHOUT CONTRAST  TECHNIQUE: Contiguous axial images were obtained from the base of the skull through the vertex without intravenous contrast.  COMPARISON: Brain MRI 10/01/2010  FINDINGS: No acute intracranial hemorrhage. No focal mass lesion. No CT evidence of acute infarction. No midline shift or mass effect. No hydrocephalus. Basilar cisterns are patent.  Minimal atrophy and white matter microvascular disease.  Paranasal sinuses and mastoid air cells are clear.  IMPRESSION: No acute intracranial findings. No change from MRI    ____________________________________________   PROCEDURES  Procedure(s) performed: None  Critical Care performed: No  ____________________________________________   INITIAL IMPRESSION / ASSESSMENT AND PLAN / ED COURSE  Pertinent labs & imaging results that were available during my care of the patient were reviewed by me and considered in my medical decision making (see chart for details).  Patient presents with symptoms of vertigo, likely of a peripheral source based on history and exam. In addition he does have significant hypertension with blood pressure as high as 295 systolic in the ER. I will treat his blood pressure, this could represent hypertensive urgency though I find it much more likely to be related to vertigo and concomitant hypertension which is likely not directly related to his symptoms. I will treat him with lisinopril and hydralazine in the ER as he is occasionally bradycardic. I will obtain a CT to rule out central causes, though I suspect he likely does not have a central cause. No  evidence of stroke with a normal neurologic exam.  ----------------------------------------- 7:09 PM on 08/26/2014 -----------------------------------------  CT scan is normal. Patient asymptomatic. Blood pressure improved into the 388E systolic. Discussed with the patient and he will increase his lisinopril dose to 10 mg once daily, chart his blood pressures and follow up with his primary care doctor this week. No also give her referral to ear nose and throat for what appears to be benign positional vertigo.  Discussed return precautions and also that he needs to be extra cautious while driving, and that I would completely avoid working at heights, driving forklifts, or being up on ladders at work in case he were to become dizzy or fall.  ____________________________________________   FINAL CLINICAL IMPRESSION(S) / ED DIAGNOSES  Final diagnoses:  Vertigo, peripheral, left  Secondary hypertension, unspecified      [name], MD 08/26/14 1925

## 2014-08-26 NOTE — ED Notes (Signed)
Pt c/o having severe dizziness with N/V yesterday lasting 1-2hrs, states today just feels mildly off balance with "fullness in my head".Marland Kitchen

## 2014-08-26 NOTE — Discharge Instructions (Signed)
Vertigo Vertigo means you feel like you or your surroundings are moving when they are not. Vertigo can be dangerous if it occurs when you are at work, driving, or performing difficult activities.  CAUSES  Vertigo occurs when there is a conflict of signals sent to your brain from the visual and sensory systems in your body. There are many different causes of vertigo, including:  Infections, especially in the inner ear.  A bad reaction to a drug or misuse of alcohol and medicines.  Withdrawal from drugs or alcohol.  Rapidly changing positions, such as lying down or rolling over in bed.  A migraine headache.  Decreased blood flow to the brain.  Increased pressure in the brain from a head injury, infection, tumor, or bleeding. SYMPTOMS  You may feel as though the world is spinning around or you are falling to the ground. Because your balance is upset, vertigo can cause nausea and vomiting. You may have involuntary eye movements (nystagmus). DIAGNOSIS  Vertigo is usually diagnosed by physical exam. If the cause of your vertigo is unknown, your caregiver may perform imaging tests, such as an MRI scan (magnetic resonance imaging). TREATMENT  Most cases of vertigo resolve on their own, without treatment. Depending on the cause, your caregiver may prescribe certain medicines. If your vertigo is related to body position issues, your caregiver may recommend movements or procedures to correct the problem. In rare cases, if your vertigo is caused by certain inner ear problems, you may need surgery. HOME CARE INSTRUCTIONS   Follow your caregiver's instructions.  Avoid driving.  Avoid operating heavy machinery.  Avoid performing any tasks that would be dangerous to you or others during a vertigo episode.  Tell your caregiver if you notice that certain medicines seem to be causing your vertigo. Some of the medicines used to treat vertigo episodes can actually make them worse in some people. SEEK  IMMEDIATE MEDICAL CARE IF:   Your medicines do not relieve your vertigo or are making it worse.  You develop problems with talking, walking, weakness, or using your arms, hands, or legs.  You develop severe headaches.  Your nausea or vomiting continues or gets worse.  You develop visual changes.  A family member notices behavioral changes.  Your condition gets worse. MAKE SURE YOU:  Understand these instructions.  Will watch your condition.  Will get help right away if you are not doing well or get worse. Document Released: 10/23/2004 Document Revised: 04/07/2011 Document Reviewed: 08/01/2010 [facility] Patient Information 2015 [place], [place]. This information is not intended to replace advice given to you by your health care provider. Make sure you discuss any questions you have with your health care provider.  Hypertension Hypertension is another name for high blood pressure. High blood pressure forces your heart to work harder to pump blood. A blood pressure reading has two numbers, which includes a higher number over a lower number (example: 110/72). HOME CARE   Have your blood pressure rechecked by your doctor.  Only take medicine as told by your doctor. Follow the directions carefully. The medicine does not work as well if you skip doses. Skipping doses also puts you at risk for problems.  Do not smoke.  Monitor your blood pressure at home as told by your doctor. GET HELP IF:  You think you are having a reaction to the medicine you are taking.  You have repeat headaches or feel dizzy.  You have puffiness (swelling) in your ankles.  You have trouble with  your vision. GET HELP RIGHT AWAY IF:   You get a very bad headache and are confused.  You feel weak, numb, or faint.  You get chest or belly (abdominal) pain.  You throw up (vomit).  You cannot breathe very well. MAKE SURE YOU:   Understand these instructions.  Will watch your condition.  Will get  help right away if you are not doing well or get worse. Document Released: 07/02/2007 Document Revised: 01/18/2013 Document Reviewed: 11/05/2012 [facility] Patient Information 2015 [place], [place]. This information is not intended to replace advice given to you by your health care provider. Make sure you discuss any questions you have with your health care provider.

## 2014-09-05 ENCOUNTER — Telehealth: Payer: Self-pay | Admitting: Family Medicine

## 2014-09-05 NOTE — Telephone Encounter (Signed)
Patient Name: Jeff Ward DOB: Oct 16, 1950 Initial Comment Caller States husbands BP has been going up/down for awhile now saturday. does not know current reading. Nurse Assessment Nurse: Ronnald Ramp, RN, Miranda Date/Time (Eastern Time): 09/05/2014 2:14:22 PM Confirm and document reason for call. If symptomatic, describe symptoms. ---Spoke with pt, he states 10 days ago he had dizziness and seen in UC. BP was 189/89 and sent to ED. Diagnosed with vertigo. He has still been having BP going up. Today BP 196/94 Has the patient traveled out of the country within the last 30 days? ---Not Applicable Does the patient require triage? ---Yes Related visit to physician within the last 2 weeks? ---No Does the PT have any chronic conditions? (i.e. diabetes, asthma, etc.) ---Yes List chronic conditions. ---HTN, Arthritis, Depression Guidelines Guideline Title Affirmed Question Affirmed Notes High Blood Pressure BP # 160/100 Final Disposition User See PCP When Office is Open (within 3 days) Ronnald Ramp, RN, Miranda Comments Dr. Deborra Medina is not on the schedule this week. Scheduled appt with Dr. Edilia Bo tomorrow 8/10 at 3:45p Disagree/Comply: Comply

## 2014-09-05 NOTE — Telephone Encounter (Signed)
[facility] scheduled appt with Dr Lorelei Pont on 09/06/14 at 3:45 PM.

## 2014-09-06 ENCOUNTER — Encounter: Payer: Self-pay | Admitting: Family Medicine

## 2014-09-06 ENCOUNTER — Ambulatory Visit (INDEPENDENT_AMBULATORY_CARE_PROVIDER_SITE_OTHER): Payer: Medicare Other | Admitting: Family Medicine

## 2014-09-06 VITALS — BP 176/84 | HR 70 | Temp 98.6°F | Wt 207.8 lb

## 2014-09-06 DIAGNOSIS — I10 Essential (primary) hypertension: Secondary | ICD-10-CM

## 2014-09-06 DIAGNOSIS — I169 Hypertensive crisis, unspecified: Secondary | ICD-10-CM

## 2014-09-06 MED ORDER — HYDROCHLOROTHIAZIDE 12.5 MG PO TABS: 12.5000 mg | ORAL_TABLET | Freq: Every day | ORAL | Status: DC

## 2014-09-06 MED ORDER — LISINOPRIL 40 MG PO TABS: ORAL_TABLET | ORAL | Status: DC

## 2014-09-06 NOTE — Progress Notes (Signed)
Dr. Frederico Hamman T. Tyarra Nolton, MD, [facility] [place], [address] Phone: [phone] Fax: [phone]  09/06/2014  Patient: Jeff Ward, MRN: [id number], DOB: [birth year], 64 y.o.  Primary Physician:  Arnette Norris, MD  Chief Complaint: Hypertension  Subjective:   [name] is a 64 y.o. very pleasant male patient who presents with the following:  [id number].  The patient is worked in urgently for hypertensive crisis, and he has very well documented blood pressures.  He has a nice blood pressure machine at home, he has been taking his blood pressure every 1-2 days.  He has at least 20 readings.  He reports compliance with his lisinopril 20 mg a day.  He has never been on any other blood pressure medications.  He has not had any additional trauma, injury, additional medication or supplements or any cold medication.  Had some vertigo, improved with meclizine  Past Medical History, Surgical History, Social History, Family History, Problem List, Medications, and Allergies have been reviewed and updated if relevant.  Patient Active Problem List   Diagnosis Date Noted  . Subcutaneous mass 06/07/2014  . Stress at home 06/07/2014  . BPV (benign positional vertigo) 02/09/2014  . Headache 11/10/2013  . Personal history of colonic polyps 04/27/2013  . Leg edema 04/27/2013  . Multiple nevi 04/27/2013  . Depression 02/17/2013  . Hyperglycemia 07/23/2011  . OSA (obstructive sleep apnea) 04/10/2011  . Testosterone deficiency 03/06/2011  . Night sweats 05/09/2010  . Degenerative disc disease, lumbar 05/09/2010  . BLOOD IN STOOL 11/15/2009  . OTHER NONSPECIFIC ABNORMAL SERUM ENZYME LEVELS 05/23/2009  . LEG CRAMPS 05/15/2009  . PALPITATIONS 05/15/2009  . LUMBAR STRAIN 11/28/2008  . Essential hypertension, benign 09/01/2007  . COLONIC POLYPS 10/23/2006  . HLD (hyperlipidemia) 10/23/2006  . PUD 10/23/2006  . ARTHRITIS,  GENERALIZED 10/23/2006  . CARPAL TUNNEL SYNDROME, BILATERAL, HX OF 10/23/2006  . BENIGN PROSTATIC HYPERTROPHY, HX OF 10/23/2006  . SHOULDER PAIN, RIGHT 10/21/2006  . LIVER FUNCTION TESTS, ABNORMAL 06/04/2006    Past Medical History  Diagnosis Date  . 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 Hyperlipidemia 06/2002  . Hypertension   . Arthritis     In back and all joints    Past Surgical History  Procedure Laterality Date  . Carpal tunnel release  2005     / right hand  . Tonsillectomy and adenoidectomy      64 years old    Social History   Social History  . Marital Status: Married    Spouse Name: N/A  . Number of Children: 2  . Years of Education: N/A   Occupational History  . [employer]Maintence     currently unemployed  .     Social History Main Topics  . Smoking status: Former Smoker -- 35 years    Types: Cigars    Quit date: 04/02/2010  . Smokeless tobacco: Never Used     Comment: smoked cigars 1 to 2 daily  . Alcohol Use: No     Comment: 2-3 beers a day  . Drug Use: No  . Sexual Activity:    Partners: Female     Comment: declined condoms   Other Topics Concern  . Not on file   Social History Narrative    Family History  Problem Relation Age of Onset  . Cancer Father     history of melanona  . Cancer Paternal Aunt  breast  . Alcohol abuse Maternal Grandfather     Allergies  Allergen Reactions  . Nutritional Supplements   . Sulfonamide Derivatives     REACTION: unconsciousness/dizziness    Medication list reviewed and updated in full in [facility].   GEN: No acute illnesses, no fevers, chills. GI: No n/v/d, eating normally Pulm: No SOB Interactive and getting along well at home.  Otherwise, ROS is as per the HPI.  Objective:   BP 176/84 mmHg  Pulse 70  Temp(Src) 98.6 F (37 C) (Oral)  Wt 207 lb 12 oz (94.235 kg)  SpO2 98%  GEN: WDWN, NAD, Non-toxic, A & O x 3 HEENT: Atraumatic,  Normocephalic. Neck supple. No masses, No LAD. Ears and Nose: No external deformity. CV: RRR, No M/G/R. No JVD. No thrill. No extra heart sounds. PULM: CTA B, no wheezes, crackles, rhonchi. No retractions. No resp. distress. No accessory muscle use. EXTR: No c/c/e NEURO Normal gait.  PSYCH: Normally interactive. Conversant. Not depressed or anxious appearing.  Calm demeanor.   Laboratory and Imaging Data: Results for orders placed or performed during the hospital encounter of 57/90/38  Basic metabolic panel  Result Value Ref Range   Sodium 136 135 - 145 mmol/L   Potassium 4.6 3.5 - 5.1 mmol/L   Chloride 98 (L) 101 - 111 mmol/L   CO2 29 22 - 32 mmol/L   Glucose, Bld 121 (H) 65 - 99 mg/dL   BUN 19 6 - 20 mg/dL   Creatinine, Ser 0.96 0.61 - 1.24 mg/dL   Calcium 9.5 8.9 - 10.3 mg/dL   GFR calc non Af Amer >60 >60 mL/min   GFR calc Af Amer >60 >60 mL/min   Anion gap 9 5 - 15  CBC  Result Value Ref Range   WBC 7.2 3.8 - 10.6 K/uL   RBC 4.10 (L) 4.40 - 5.90 MIL/uL   Hemoglobin 14.1 13.0 - 18.0 g/dL   HCT 40.9 40.0 - 52.0 %   MCV 100.0 80.0 - 100.0 fL   MCH 34.3 (H) 26.0 - 34.0 pg   MCHC 34.3 32.0 - 36.0 g/dL   RDW 13.9 11.5 - 14.5 %   Platelets 163 150 - 440 K/uL  Urinalysis complete, with microscopic ([facility] only)  Result Value Ref Range   Color, Urine AMBER (A) YELLOW   APPearance CLEAR (A) CLEAR   Glucose, UA NEGATIVE NEGATIVE mg/dL   Bilirubin Urine NEGATIVE NEGATIVE   Ketones, ur NEGATIVE NEGATIVE mg/dL   Specific Gravity, Urine 1.017 1.005 - 1.030   Hgb urine dipstick NEGATIVE NEGATIVE   pH 6.0 5.0 - 8.0   Protein, ur 30 (A) NEGATIVE mg/dL   Nitrite NEGATIVE NEGATIVE   Leukocytes, UA NEGATIVE NEGATIVE   RBC / HPF 0-5 0 - 5 RBC/hpf   WBC, UA 0-5 0 - 5 WBC/hpf   Bacteria, UA RARE (A) NONE SEEN   Squamous Epithelial / LPF 0-5 (A) NONE SEEN   Mucous PRESENT    Amorphous Crystal PRESENT      Assessment and Plan:   Hypertensive crisis  Essential hypertension,  benign  Markedly elevated blood pressures.  Given risk primarily for vascular event, I am going to increase his lisinopril to 40 mg a day and add hydrochlorothiazide 12.5 mg.  He will need close follow-up until blood pressures normalized.  I have instructed him to check his blood pressure once daily and bring in the written numbers to his next appointment.  Follow-up: Return in about 10 days (around  09/16/2014) for f/u with Dr. Deborra Medina.  New Prescriptions   HYDROCHLOROTHIAZIDE (HYDRODIURIL) 12.5 MG TABLET    Take 1 tablet (12.5 mg total) by mouth daily.   No orders of the defined types were placed in this encounter.    Signed,  Maud Deed. Johnette Teigen, MD   Patient's Medications  New Prescriptions   HYDROCHLOROTHIAZIDE (HYDRODIURIL) 12.5 MG TABLET    Take 1 tablet (12.5 mg total) by mouth daily.  Previous Medications   DICLOFENAC (VOLTAREN) 75 MG EC TABLET    Take 1 tablet by mouth  twice a day   DULOXETINE (CYMBALTA) 30 MG CAPSULE    TAKE 1 CAPSULE (30 MG TOTAL) BY MOUTH DAILY.   EPINEPHRINE (EPI-PEN) 0.3 MG/0.3 ML SOAJ INJECTION    Inject 0.3 mLs (0.3 mg total) into the muscle once.   FENOFIBRATE (TRICOR) 145 MG TABLET    TAKE 1 TABLET BY MOUTH DAILY   GLUCOSAMINE-CHONDROITIN 500-400 MG TABLET    Take 1 tablet by mouth 2 (two) times daily.     LANSOPRAZOLE (PREVACID) 30 MG CAPSULE    Take 1 capsule by mouth  daily   MECLIZINE (ANTIVERT) 25 MG TABLET    Take 1 tablet (25 mg total) by mouth 3 (three) times daily as needed for dizziness or nausea.   MELOXICAM (MOBIC) 7.5 MG TABLET    Take 1 tablet by mouth  twice a day   MULTIPLE VITAMIN (MULTIVITAMIN) TABLET    Take 1 tablet by mouth daily. Mens MVI-Take one daily   POTASSIUM 99 MG TABS    Take 1 tablet by mouth 2 (two) times daily.    Modified Medications   Modified Medication Previous Medication   LISINOPRIL (PRINIVIL,ZESTRIL) 40 MG TABLET lisinopril (PRINIVIL,ZESTRIL) 20 MG tablet      Take 1 tablet by mouth  daily    Take 1 tablet by  mouth  daily  Discontinued Medications   No medications on file

## 2014-09-06 NOTE — Progress Notes (Signed)
Pre visit review using our clinic review tool, if applicable. No additional management support is needed unless otherwise documented below in the visit note. 

## 2014-09-18 ENCOUNTER — Encounter: Payer: Self-pay | Admitting: Family Medicine

## 2014-09-18 ENCOUNTER — Ambulatory Visit (INDEPENDENT_AMBULATORY_CARE_PROVIDER_SITE_OTHER): Payer: Medicare Other | Admitting: Family Medicine

## 2014-09-18 VITALS — BP 146/78 | HR 73 | Temp 98.6°F | Ht 66.0 in | Wt 200.8 lb

## 2014-09-18 DIAGNOSIS — R160 Hepatomegaly, not elsewhere classified: Secondary | ICD-10-CM

## 2014-09-18 DIAGNOSIS — R1013 Epigastric pain: Secondary | ICD-10-CM

## 2014-09-18 DIAGNOSIS — R945 Abnormal results of liver function studies: Secondary | ICD-10-CM

## 2014-09-18 DIAGNOSIS — I10 Essential (primary) hypertension: Secondary | ICD-10-CM

## 2014-09-18 DIAGNOSIS — K439 Ventral hernia without obstruction or gangrene: Secondary | ICD-10-CM | POA: Diagnosis not present

## 2014-09-18 DIAGNOSIS — R7989 Other specified abnormal findings of blood chemistry: Secondary | ICD-10-CM

## 2014-09-18 MED ORDER — HYDROCHLOROTHIAZIDE 25 MG PO TABS: 25.0000 mg | ORAL_TABLET | Freq: Every day | ORAL | Status: DC

## 2014-09-18 NOTE — Progress Notes (Signed)
Pre visit review using our clinic review tool, if applicable. No additional management support is needed unless otherwise documented below in the visit note. 

## 2014-09-18 NOTE — Progress Notes (Signed)
Dr. Frederico Hamman T. Journii Nierman, MD, [facility] [place], [address] Phone: [phone] Fax: [phone]  09/18/2014  Patient: Jeff Ward, MRN: [id number], DOB: July 06, 1950, 64 y.o.  Primary Physician:  Arnette Norris, MD  Chief Complaint: Hypertension  Subjective:   Jeff Ward is a 64 y.o. very pleasant male patient who presents with the following:  Patient of my partner Dr. Hulen Shouts who I saw last week for hypertensive crisis, who ended up following up with me regarding his BP as well as some other acute relatively urgent issues.  HTN: BP is stabilizing after increasing lisinopril to 40 mg and adding HCTZ 12.5 mg Brings his own BP numbers, which are still modestly elevated.  No SE  Ventral hernia. Saturday, really got hurting, took some ibuprofen, tender right in the middle. He was climbing ladders and exerting himself.  Rates pain 7/10 at times Decreased PO Passing some gas.  He also has had persistently elevated LFT's No results found for: HAV, HEPAIGM, HEPBIGM, HEPBCAB, HBEAG, HEPCAB   Lab Results  Component Value Date   ALT 67* 12/20/2013   AST 64* 12/20/2013   ALKPHOS 66 12/20/2013   BILITOT 0.4 12/20/2013     150/80  09/06/2014 Last OV with [name], MD  [phone].  The patient is worked in urgently for hypertensive crisis, and he has very well documented blood pressures.  He has a nice blood pressure machine at home, he has been taking his blood pressure every 1-2 days.  He has at least 20 readings.  He reports compliance with his lisinopril 20 mg a day.  He has never been on any other blood pressure medications.  He has not had any additional trauma, injury, additional medication or supplements or any cold medication.  Had some vertigo, improved with meclizine  Past Medical History, Surgical History, Social History, Family History, Problem List, Medications, and Allergies have been reviewed and  updated if relevant.  Patient Active Problem List   Diagnosis Date Noted  . Subcutaneous mass 06/07/2014  . Stress at home 06/07/2014  . BPV (benign positional vertigo) 02/09/2014  . Headache 11/10/2013  . Personal history of colonic polyps 04/27/2013  . Leg edema 04/27/2013  . Multiple nevi 04/27/2013  . Depression 02/17/2013  . Hyperglycemia 07/23/2011  . OSA (obstructive sleep apnea) 04/10/2011  . Testosterone deficiency 03/06/2011  . Night sweats 05/09/2010  . Degenerative disc disease, lumbar 05/09/2010  . BLOOD IN STOOL 11/15/2009  . OTHER NONSPECIFIC ABNORMAL SERUM ENZYME LEVELS 05/23/2009  . LEG CRAMPS 05/15/2009  . PALPITATIONS 05/15/2009  . LUMBAR STRAIN 11/28/2008  . Essential hypertension, benign 09/01/2007  . COLONIC POLYPS 10/23/2006  . HLD (hyperlipidemia) 10/23/2006  . PUD 10/23/2006  . ARTHRITIS, GENERALIZED 10/23/2006  . CARPAL TUNNEL SYNDROME, BILATERAL, HX OF 10/23/2006  . BENIGN PROSTATIC HYPERTROPHY, HX OF 10/23/2006  . SHOULDER PAIN, RIGHT 10/21/2006  . LIVER FUNCTION TESTS, ABNORMAL 06/04/2006    Past Medical History  Diagnosis Date  . 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 Hyperlipidemia 06/2002  . Hypertension   . Arthritis     In back and all joints    Past Surgical History  Procedure Laterality Date  . Carpal tunnel release  2005     / right hand  . Tonsillectomy and adenoidectomy      64 years old    Social History   Social History  . Marital Status: Married  Spouse Name: N/A  . Number of Children: 2  . Years of Education: N/A   Occupational History  . [employer]Maintence     currently unemployed  .     Social History Main Topics  . Smoking status: Former Smoker -- 35 years    Types: Cigars    Quit date: 04/02/2010  . Smokeless tobacco: Never Used     Comment: smoked cigars 1 to 2 daily  . Alcohol Use: No     Comment: 2-3 beers a day  . Drug Use: No  . Sexual Activity:    Partners:  Female     Comment: declined condoms   Other Topics Concern  . Not on file   Social History Narrative    Family History  Problem Relation Age of Onset  . Cancer Father     history of melanona  . Cancer Paternal Aunt     breast  . Alcohol abuse Maternal Grandfather     Allergies  Allergen Reactions  . Nutritional Supplements   . Sulfonamide Derivatives     REACTION: unconsciousness/dizziness    Medication list reviewed and updated in full in [facility].  GEN: No acute illnesses, no fevers, chills. GI: decreased PO, ate yesterday Pulm: No SOB Interactive and getting along well at home.  Otherwise, ROS is as per the HPI.  Objective:   BP 146/78 mmHg  Pulse 73  Temp(Src) 98.6 F (37 C) (Oral)  Ht 5\' 6"  (1.676 m)  Wt 200 lb 12 oz (91.06 kg)  BMI 32.42 kg/m2  GEN: WDWN, NAD, Non-toxic, A & O x 3 HEENT: Atraumatic, Normocephalic. Neck supple. No masses, No LAD. Ears and Nose: No external deformity. CV: RRR, No M/G/R. No JVD. No thrill. No extra heart sounds. PULM: CTA B, no wheezes, crackles, rhonchi. No retractions. No resp. distress. No accessory muscle use. ABD: large ventral hernia, appears reducible, but painful. S, tender at hernia, ND, + BS, No rebound, on my exam the liver lower margin appears approximately 3 - 3 1/2 inches below the R costal margin. On palpation and by percussion.  EXTR: No c/c/e NEURO Normal gait.  PSYCH: Normally interactive. Conversant. Not depressed or anxious appearing.  Calm demeanor.   Laboratory and Imaging Data: Results for orders placed or performed during the hospital encounter of 28/76/81  Basic metabolic panel  Result Value Ref Range   Sodium 136 135 - 145 mmol/L   Potassium 4.6 3.5 - 5.1 mmol/L   Chloride 98 (L) 101 - 111 mmol/L   CO2 29 22 - 32 mmol/L   Glucose, Bld 121 (H) 65 - 99 mg/dL   BUN 19 6 - 20 mg/dL   Creatinine, Ser 0.96 0.61 - 1.24 mg/dL   Calcium 9.5 8.9 - 10.3 mg/dL   GFR calc non Af Amer >60 >60  mL/min   GFR calc Af Amer >60 >60 mL/min   Anion gap 9 5 - 15  CBC  Result Value Ref Range   WBC 7.2 3.8 - 10.6 K/uL   RBC 4.10 (L) 4.40 - 5.90 MIL/uL   Hemoglobin 14.1 13.0 - 18.0 g/dL   HCT 40.9 40.0 - 52.0 %   MCV 100.0 80.0 - 100.0 fL   MCH 34.3 (H) 26.0 - 34.0 pg   MCHC 34.3 32.0 - 36.0 g/dL   RDW 13.9 11.5 - 14.5 %   Platelets 163 150 - 440 K/uL  Urinalysis complete, with microscopic ([facility] only)  Result Value Ref Range  Color, Urine AMBER (A) YELLOW   APPearance CLEAR (A) CLEAR   Glucose, UA NEGATIVE NEGATIVE mg/dL   Bilirubin Urine NEGATIVE NEGATIVE   Ketones, ur NEGATIVE NEGATIVE mg/dL   Specific Gravity, Urine 1.017 1.005 - 1.030   Hgb urine dipstick NEGATIVE NEGATIVE   pH 6.0 5.0 - 8.0   Protein, ur 30 (A) NEGATIVE mg/dL   Nitrite NEGATIVE NEGATIVE   Leukocytes, UA NEGATIVE NEGATIVE   RBC / HPF 0-5 0 - 5 RBC/hpf   WBC, UA 0-5 0 - 5 WBC/hpf   Bacteria, UA RARE (A) NONE SEEN   Squamous Epithelial / LPF 0-5 (A) NONE SEEN   Mucous PRESENT    Amorphous Crystal PRESENT     Lab Results  Component Value Date   ALT 67* 12/20/2013   AST 64* 12/20/2013   ALKPHOS 66 12/20/2013   BILITOT 0.4 12/20/2013     Assessment and Plan:   Enlarged liver - Plan: CT Abdomen Pelvis W Contrast  Ventral hernia, recurrence not specified - Plan: CT Abdomen Pelvis W Contrast  Abnormal LFTs - Plan: CT Abdomen Pelvis W Contrast  Abdominal pain, epigastric - Plan: CT Abdomen Pelvis W Contrast  Essential hypertension, benign  Increase HCTZ to 25 mg  Given painful ventral hernia, enlarged liver on exam, persistently elevated LFT's, obtain a CT of the abdomen and pelvis with contrast to evaluate to cirrhotic changes, liver abscess, hepatocellular carcinoma, strangulated hernia, ileus, or other acute intraabdominal process.   Further dispo will depend on CT results  Orders Placed This Encounter  Procedures  . CT Abdomen Pelvis W Contrast    Signed,  Jamair Cato T. Breslyn Abdo,  MD   Patient's Medications  New Prescriptions   No medications on file  Previous Medications   DICLOFENAC (VOLTAREN) 75 MG EC TABLET    Take 1 tablet by mouth  twice a day   DULOXETINE (CYMBALTA) 30 MG CAPSULE    TAKE 1 CAPSULE (30 MG TOTAL) BY MOUTH DAILY.   EPINEPHRINE (EPI-PEN) 0.3 MG/0.3 ML SOAJ INJECTION    Inject 0.3 mLs (0.3 mg total) into the muscle once.   FENOFIBRATE (TRICOR) 145 MG TABLET    TAKE 1 TABLET BY MOUTH DAILY   GLUCOSAMINE-CHONDROITIN 500-400 MG TABLET    Take 1 tablet by mouth 2 (two) times daily.     LANSOPRAZOLE (PREVACID) 30 MG CAPSULE    Take 1 capsule by mouth  daily   LISINOPRIL (PRINIVIL,ZESTRIL) 40 MG TABLET    Take 1 tablet by mouth  daily   MECLIZINE (ANTIVERT) 25 MG TABLET    Take 1 tablet (25 mg total) by mouth 3 (three) times daily as needed for dizziness or nausea.   MELOXICAM (MOBIC) 7.5 MG TABLET    Take 1 tablet by mouth  twice a day   MULTIPLE VITAMIN (MULTIVITAMIN) TABLET    Take 1 tablet by mouth daily. Mens MVI-Take one daily   POTASSIUM 99 MG TABS    Take 1 tablet by mouth 2 (two) times daily.    Modified Medications   Modified Medication Previous Medication   HYDROCHLOROTHIAZIDE (HYDRODIURIL) 25 MG TABLET hydrochlorothiazide (HYDRODIURIL) 12.5 MG tablet      Take 1 tablet (25 mg total) by mouth daily.    Take 1 tablet (12.5 mg total) by mouth daily.  Discontinued Medications   No medications on file

## 2014-09-18 NOTE — Patient Instructions (Signed)

## 2014-09-20 ENCOUNTER — Ambulatory Visit
Admission: RE | Admit: 2014-09-20 | Discharge: 2014-09-20 | Disposition: A | Discharge status: Home / Self Care | Payer: Medicare Other | Source: Ambulatory Visit | Attending: Family Medicine | Admitting: Family Medicine

## 2014-09-20 DIAGNOSIS — R1013 Epigastric pain: Secondary | ICD-10-CM | POA: Insufficient documentation

## 2014-09-20 DIAGNOSIS — K439 Ventral hernia without obstruction or gangrene: Secondary | ICD-10-CM | POA: Diagnosis present

## 2014-09-20 DIAGNOSIS — R7989 Other specified abnormal findings of blood chemistry: Secondary | ICD-10-CM | POA: Insufficient documentation

## 2014-09-20 DIAGNOSIS — R945 Abnormal results of liver function studies: Secondary | ICD-10-CM

## 2014-09-20 DIAGNOSIS — Z87891 Personal history of nicotine dependence: Secondary | ICD-10-CM | POA: Diagnosis not present

## 2014-09-20 DIAGNOSIS — K5730 Diverticulosis of large intestine without perforation or abscess without bleeding: Secondary | ICD-10-CM | POA: Diagnosis not present

## 2014-09-20 DIAGNOSIS — R160 Hepatomegaly, not elsewhere classified: Secondary | ICD-10-CM

## 2014-09-20 DIAGNOSIS — K4020 Bilateral inguinal hernia, without obstruction or gangrene, not specified as recurrent: Secondary | ICD-10-CM | POA: Insufficient documentation

## 2014-09-20 MED ORDER — IOHEXOL 300 MG/ML  SOLN
100.0000 mL | Freq: Once | INTRAMUSCULAR | Status: AC | PRN
  Administered 2014-09-20: 100 mL via INTRAVENOUS

## 2014-09-22 ENCOUNTER — Other Ambulatory Visit: Payer: Self-pay | Admitting: Family Medicine

## 2014-09-22 DIAGNOSIS — R160 Hepatomegaly, not elsewhere classified: Secondary | ICD-10-CM

## 2014-09-22 DIAGNOSIS — K439 Ventral hernia without obstruction or gangrene: Secondary | ICD-10-CM

## 2014-09-22 DIAGNOSIS — K7460 Unspecified cirrhosis of liver: Secondary | ICD-10-CM

## 2014-09-22 DIAGNOSIS — R1013 Epigastric pain: Secondary | ICD-10-CM

## 2014-09-27 ENCOUNTER — Ambulatory Visit
Admission: RE | Admit: 2014-09-27 | Discharge: 2014-09-27 | Disposition: A | Discharge status: Home / Self Care | Payer: Medicare Other | Source: Ambulatory Visit | Attending: Family Medicine | Admitting: Family Medicine

## 2014-09-27 DIAGNOSIS — K439 Ventral hernia without obstruction or gangrene: Secondary | ICD-10-CM

## 2014-09-27 DIAGNOSIS — R188 Other ascites: Secondary | ICD-10-CM | POA: Diagnosis not present

## 2014-09-27 DIAGNOSIS — K7460 Unspecified cirrhosis of liver: Secondary | ICD-10-CM | POA: Insufficient documentation

## 2014-09-27 DIAGNOSIS — K828 Other specified diseases of gallbladder: Secondary | ICD-10-CM | POA: Diagnosis not present

## 2014-09-27 DIAGNOSIS — R1013 Epigastric pain: Secondary | ICD-10-CM | POA: Insufficient documentation

## 2014-09-27 DIAGNOSIS — R160 Hepatomegaly, not elsewhere classified: Secondary | ICD-10-CM

## 2014-09-28 ENCOUNTER — Other Ambulatory Visit (INDEPENDENT_AMBULATORY_CARE_PROVIDER_SITE_OTHER): Payer: Medicare Other

## 2014-09-28 ENCOUNTER — Ambulatory Visit (INDEPENDENT_AMBULATORY_CARE_PROVIDER_SITE_OTHER): Payer: Medicare Other | Admitting: Physician Assistant

## 2014-09-28 ENCOUNTER — Encounter: Payer: Self-pay | Admitting: Physician Assistant

## 2014-09-28 VITALS — BP 140/68 | HR 76 | Ht 65.0 in | Wt 200.0 lb

## 2014-09-28 DIAGNOSIS — E785 Hyperlipidemia, unspecified: Secondary | ICD-10-CM | POA: Diagnosis not present

## 2014-09-28 DIAGNOSIS — R945 Abnormal results of liver function studies: Secondary | ICD-10-CM

## 2014-09-28 DIAGNOSIS — R160 Hepatomegaly, not elsewhere classified: Secondary | ICD-10-CM

## 2014-09-28 DIAGNOSIS — R7989 Other specified abnormal findings of blood chemistry: Secondary | ICD-10-CM

## 2014-09-28 LAB — CBC WITH DIFFERENTIAL/PLATELET
BASOS ABS: 0.1 10*3/uL (ref 0.0–0.1)
Basophils Relative: 0.7 % (ref 0.0–3.0)
EOS ABS: 0.2 10*3/uL (ref 0.0–0.7)
EOS PCT: 3.1 % (ref 0.0–5.0)
HCT: 38.7 % — ABNORMAL LOW (ref 39.0–52.0)
Hemoglobin: 13.2 g/dL (ref 13.0–17.0)
LYMPHS ABS: 1.5 10*3/uL (ref 0.7–4.0)
Lymphocytes Relative: 19 % (ref 12.0–46.0)
MCHC: 34.1 g/dL (ref 30.0–36.0)
MCV: 100.8 fl — ABNORMAL HIGH (ref 78.0–100.0)
MONO ABS: 0.7 10*3/uL (ref 0.1–1.0)
Monocytes Relative: 9.4 % (ref 3.0–12.0)
NEUTROS PCT: 67.8 % (ref 43.0–77.0)
Neutro Abs: 5.4 10*3/uL (ref 1.4–7.7)
Platelets: 206 10*3/uL (ref 150.0–400.0)
RBC: 3.84 Mil/uL — AB (ref 4.22–5.81)
RDW: 14.4 % (ref 11.5–15.5)
WBC: 7.9 10*3/uL (ref 4.0–10.5)

## 2014-09-28 LAB — COMPREHENSIVE METABOLIC PANEL
ALK PHOS: 101 U/L (ref 39–117)
ALT: 45 U/L (ref 0–53)
AST: 91 U/L — AB (ref 0–37)
Albumin: 3.9 g/dL (ref 3.5–5.2)
BUN: 18 mg/dL (ref 6–23)
CO2: 29 mEq/L (ref 19–32)
Calcium: 9.6 mg/dL (ref 8.4–10.5)
Chloride: 100 mEq/L (ref 96–112)
Creatinine, Ser: 1.06 mg/dL (ref 0.40–1.50)
GFR: 74.49 mL/min (ref >60.00)
GLUCOSE: 116 mg/dL — AB (ref 70–99)
POTASSIUM: 4.3 meq/L (ref 3.5–5.1)
SODIUM: 137 meq/L (ref 135–145)
TOTAL PROTEIN: 7.7 g/dL (ref 6.0–8.3)
Total Bilirubin: 1.2 mg/dL (ref 0.2–1.2)

## 2014-09-28 LAB — AMMONIA: Ammonia: 76 umol/L — ABNORMAL HIGH (ref 11–35)

## 2014-09-28 LAB — C-REACTIVE PROTEIN: CRP: 2.8 mg/dL (ref 0.5–20.0)

## 2014-09-28 LAB — IBC PANEL
IRON: 73 ug/dL (ref 42–165)
Saturation Ratios: 19 % — ABNORMAL LOW (ref 20.0–50.0)
TRANSFERRIN: 274 mg/dL (ref 212.0–360.0)

## 2014-09-28 LAB — FERRITIN: FERRITIN: 205.5 ng/mL (ref 22.0–322.0)

## 2014-09-28 LAB — PROTIME-INR
INR: 1.3 ratio — AB (ref 0.8–1.0)
PROTHROMBIN TIME: 14.5 s — AB (ref 9.6–13.1)

## 2014-09-28 LAB — TSH: TSH: 2.14 u[IU]/mL (ref 0.35–4.50)

## 2014-09-28 LAB — LIPASE: Lipase: 31 U/L (ref 11.0–59.0)

## 2014-09-28 LAB — IGA: IgA: 349 mg/dL (ref 68–378)

## 2014-09-28 NOTE — Progress Notes (Signed)
Reviewed and agree with management plan.  Cleotis Sparr T. Korene Dula, MD FACG 

## 2014-09-28 NOTE — Progress Notes (Signed)
Patient ID: Jeff Ward, male   DOB: 02-17-1950, 64 y.o.   MRN: [id number]     History of Present Illness: Jeff Ward is a pleasant 64 year old male known to Dr. Fuller Plan. He had a colonoscopy 07/19/2013 at which time 2 sessile polyps measuring 5 mm in the sigmoid and rectum were removed via polypectomy with a cold snare. He was also noted to have mild diverticulosis in the sigmoid as well as moderate sized internal hemorrhoids. He was instructed to have a repeat colonoscopy in 5 years.  Jeff Ward is here today for evaluation of abnormal LFTs. He was evaluated several weeks ago for hypertensive crisis. He was seen in follow-up by Dr. Lucienne Minks on August 22 and noted to have abnormal LFTs. He was also noted to have hepatomegaly on exam and has been referred for evaluation. He feels well and has no abdominal pain. He has no nausea or vomiting. He has not vomited coffee ground emesis or bright red blood. He has no fever, chills, or night sweats, and his appetite has been good and his weight stable. He admits to having 2 drinks every night. He states he will will either have 2 cans of beer, 2 glasses of wine, or to mixed drinks usually of bourbon or whiskey. He has done this for years and doesn't ever drink more than his 2 drinks. He denies herbal supplements and has not been on any new medications. He has never had a blood transfusion. He denies a history of intravenous drug abuse and he has never used any intranasal cocaine. He has no prior history of hepatitis. He has not had any confusion and has no unusual joint aches. He had a CT of the abdomen and pelvis on August 24 that showed suspected cirrhotic liver with small amount of perihepatic ascites. Bilateral inguinal hernias containing fat. Mild thickening of the wall of the cecal tip, potentially an artifact from underdistention but colonoscopy recommended to exclude other causes of wall thickening including tumor. Minimal distal colonic diverticulosis without  evidence of diverticulitis. Partially contracted gallbladder with [place] cholecystic infiltration, cannot exclude acute cholecystitis in the setting, recommend correlation with hepatobiliary sonography. He then had an abdominal ultrasound of the right upper quadrant that showed gallbladder wall thickening without evidence of stones or positive sonographic Murphy sign. Probable fatty infiltrative change of the liver. Early cirrhotic changes may be present. No discrete hepatic mass. Small amount of ascites. Patient reports that when he was seen by his PCP he was having pain along the ventral hernia site because the hernia had just occurred. He states the ventral hernia is no longer painful.   Past Medical History  Diagnosis Date  . 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 Hyperlipidemia 06/2002  . Hypertension   . Arthritis     In back and all joints  . Depression   . Chronic headache   . GI bleed   . Kidney stones     Past Surgical History  Procedure Laterality Date  . Carpal tunnel release  2005     / right hand  . Tonsillectomy and adenoidectomy      64 years old   Family History  Problem Relation Age of Onset  . Cancer Father     history of melanona  . Cancer Paternal Aunt     breast  . Alcohol abuse Maternal Grandfather    Social History  Substance Use Topics  . Smoking status: Former Smoker -- 35 years  Types: Cigars    Quit date: 04/02/2010  . Smokeless tobacco: Never Used     Comment: smoked cigars 1 to 2 daily  . Alcohol Use: No     Comment: 2-3 beers a day   Current Outpatient Prescriptions  Medication Sig Dispense Refill  . diclofenac (VOLTAREN) 75 MG EC tablet Take 1 tablet by mouth  twice a day 180 tablet 1  . DULoxetine (CYMBALTA) 30 MG capsule TAKE 1 CAPSULE (30 MG TOTAL) BY MOUTH DAILY. 90 capsule 1  . EPINEPHrine (EPI-PEN) 0.3 mg/0.3 mL SOAJ injection Inject 0.3 mLs (0.3 mg total) into the muscle once. 1 Device 1  . fenofibrate (TRICOR)  145 MG tablet TAKE 1 TABLET BY MOUTH DAILY 90 tablet 0  . glucosamine-chondroitin 500-400 MG tablet Take 1 tablet by mouth 2 (two) times daily.      . hydrochlorothiazide (HYDRODIURIL) 25 MG tablet Take 1 tablet (25 mg total) by mouth daily. 90 tablet 3  . lansoprazole (PREVACID) 30 MG capsule Take 1 capsule by mouth  daily 90 capsule 1  . lisinopril (PRINIVIL,ZESTRIL) 40 MG tablet Take 1 tablet by mouth  daily 30 tablet 3  . meclizine (ANTIVERT) 25 MG tablet Take 1 tablet (25 mg total) by mouth 3 (three) times daily as needed for dizziness or nausea. 30 tablet 0  . meloxicam (MOBIC) 7.5 MG tablet Take 1 tablet by mouth  twice a day 180 tablet 2  . Multiple Vitamin (MULTIVITAMIN) tablet Take 1 tablet by mouth daily. Mens MVI-Take one daily    . Potassium 99 MG TABS Take 1 tablet by mouth 2 (two) times daily.       No current facility-administered medications for this visit.   Allergies  Allergen Reactions  . Nutritional Supplements   . Sulfonamide Derivatives     REACTION: unconsciousness/dizziness     Review of Systems: Gen: Denies any fever, chills, sweats, anorexia, fatigue, weakness, malaise, weight loss, and sleep disorder CV: Denies chest pain, angina, palpitations, syncope, orthopnea, PND, peripheral edema, and claudication. Resp: Denies dyspnea at rest, dyspnea with exercise, cough, sputum, wheezing, coughing up blood, and pleurisy. GI: Denies vomiting blood, jaundice, and fecal incontinence.   Denies dysphagia or odynophagia. GU : Denies urinary burning, blood in urine, urinary frequency, urinary hesitancy, nocturnal urination, and urinary incontinence. MS: Denies joint pain, limitation of movement, and swelling, stiffness, low back pain, extremity pain. Denies muscle weakness, cramps, atrophy.  Derm: Denies rash, itching, dry skin, hives, moles, warts, or unhealing ulcers.  Psych: Denies depression, anxiety, memory loss, suicidal ideation, hallucinations, paranoia, and  confusion. Heme: Denies bruising, bleeding, and enlarged lymph nodes. Neuro:  Denies any headaches, dizziness, paresthesia Endo:  Denies any problems with DM, thyroid, adrenal  LAB RESULTS: Blood work on 04/20/2013 alkaline phosphatase 75 AST 97 ALT 97 total bili 0.4 Blood work on 12/20/2013 alkaline phosphatase 66 AST 64 ALT 67 total bili 0.4.   Studies:   Ct Abdomen Pelvis W Contrast  09/20/2014   CLINICAL DATA:  Mid abdominal epigastric pain for 1 week, enlarged liver on exam, abnormal LFTs, ventral hernia, history hypertension, hyperlipidemia, former smoker  EXAM: CT ABDOMEN AND PELVIS WITH CONTRAST  TECHNIQUE: Multidetector CT imaging of the abdomen and pelvis was performed using the standard protocol following bolus administration of intravenous contrast. Sagittal and coronal MPR images reconstructed from axial data set.  CONTRAST:  169mL OMNIPAQUE IOHEXOL 300 MG/ML SOLN IV. Dilute oral contrast.  COMPARISON:  None  FINDINGS: Lung bases clear.  Hepatic margins appear  nodular/irregular highly suspicious for cirrhosis.  No definite focal hepatic abnormalities otherwise seen.  Spleen, pancreas, and adrenal glands normal in appearance.  Small amount of pericholecystic infiltration, gallbladder partially contracted.  Circumaortic LEFT renal vein.  Symmetric nephrograms with cyst at upper pole RIGHT kidney.  Small amount of perihepatic ascites.  Dilated inguinal canals bilaterally containing fat question hernias.  Unremarkable bladder and ureters.  Appendix coiled adjacent to cecum, with cecal wall minimally thickened at cecal tip.  Minimal distal colonic diverticulosis without evidence of diverticulitis.  Stomach and bowel loops otherwise normal appearance.  Scattered atherosclerotic calcifications.  No mass, adenopathy, free air or acute bone lesion.  IMPRESSION: Suspected cirrhotic liver with small amount of perihepatic ascites.  BILATERAL inguinal hernias containing fat.  Mild thickening of the wall  of the cecal tip, potentially an artifact from underdistention but colonoscopy recommended to exclude other causes of wall thickening including tumor.  Minimal distal colonic diverticulosis without evidence of diverticulitis.  Partially contracted gallbladder with pericholecystic infiltration, cannot exclude acute cholecystitis in this setting; recommend correlation with hepatobiliary sonography.   Electronically Signed   By: Lavonia Dana M.D.   On: 09/20/2014 15:03   US Abdomen Limited Ruq  09/27/2014   CLINICAL DATA:  Cirrhotic changes of the liver and ascites, contracted gallbladder on abdominal CT scan of September 20, 2014  EXAM: US ABDOMEN LIMITED - RIGHT UPPER QUADRANT  COMPARISON:  Abdominal and pelvic CT scan of September 20, 2014  FINDINGS: Gallbladder:  The gallbladder is adequately distended. No stones or sludge are observed. The gallbladder wall is thickened at 5.7 mm. There is no positive sonographic Murphy's sign.  Common bile duct:  Diameter: 3.9 mm  Liver:  The hepatic echotexture is increased diffusely and is mildly heterogeneous. There is no focal mass nor ductal dilation. The surface contour cannot be well evaluated. A small amount of ascites is noted adjacent to the liver.  IMPRESSION: Gallbladder wall thickening without evidence of stones or positive sonographic Murphy's sign. Probable fatty infiltrative change of the liver. Early cirrhotic changes may be present. No discrete hepatic mass. Small amount of ascites.   Electronically Signed   By: David  Martinique M.D.   On: 09/27/2014 09:07     Physical Exam: BP 140/68 mmHg  Pulse 76  Ht 5\' 5"  (1.651 m)  Wt 200 lb (90.719 kg)  BMI 33.28 kg/m2 General: Pleasant, well developed , male in no acute distress Head: Normocephalic and atraumatic Eyes:  sclerae anicteric, conjunctiva pink  Ears: Normal auditory acuity Lungs: Clear throughout to auscultation Heart: Regular rate and rhythm Abdomen: Soft, non distended, mild right upper quadrant  tenderness with no rebound or guarding. Large reducible ventral hernia.. No masses, liver palpable 3 fingerbreadths below the costal margin Normal bowel sounds Musculoskeletal: Symmetrical with no gross deformities  Extremities: No edema  Neurological: Alert oriented x 4, grossly nonfocal. No asterixis Psychological:  Alert and cooperative. Normal mood and affect  Assessment and Recommendations: 64 year old male with persistently elevated transaminases found to have hepatomegaly, referred for evaluation. Imaging suggestive of early cirrhotic changes, changes of fatty liver. Patient has been advised to decrease alcohol consumption. He will adhere to a low-fat diet. A CBC, comprehensive metabolic panel, lipase, TSH, CRP, iron panel with ferritin, IgA, TTG, ammonia, PT INR, AFP, alpha-1 antitrypsin, mitochondrial antibody, antinuclear antibody, anti-smooth muscle antibody, ceruloplasmin, hep B surface antibody, hep B surface antigen, hep C antibody, had a total antibody will be obtained. He will be scheduled for an EGD to screen  for varices. Further recommendations will be made pending the findings of the above.         Demontre Padin, Vita Barley PA-C 09/28/2014,

## 2014-09-28 NOTE — Patient Instructions (Addendum)
Your physician has requested that you go to the basement for lab work before leaving today  You have been scheduled for an endoscopy. Please follow written instructions given to you at your visit today. If you use inhalers (even only as needed), please bring them with you on the day of your procedure. Your physician has requested that you go to www.[email or web address].com and enter the access code given to you at your visit today. This web site gives a general overview about your procedure. However, you should still follow specific instructions given to you by our office regarding your preparation for the procedure.  Please decrease your alcohol intake  Fat and Cholesterol Control Diet Fat and cholesterol levels in your blood and organs are influenced by your diet. High levels of fat and cholesterol may lead to diseases of the heart, small and large blood vessels, gallbladder, liver, and pancreas. CONTROLLING FAT AND CHOLESTEROL WITH DIET Although exercise and lifestyle factors are important, your diet is key. That is because certain foods are known to raise cholesterol and others to lower it. The goal is to balance foods for their effect on cholesterol and more importantly, to replace saturated and trans fat with other types of fat, such as monounsaturated fat, polyunsaturated fat, and omega-3 fatty acids. On average, a person should consume no more than 15 to 17 g of saturated fat daily. Saturated and trans fats are considered "bad" fats, and they will raise LDL cholesterol. Saturated fats are primarily found in animal products such as meats, butter, and cream. However, that does not mean you need to give up all your favorite foods. Today, there are good tasting, low-fat, low-cholesterol substitutes for most of the things you like to eat. Choose low-fat or nonfat alternatives. Choose round or loin cuts of red meat. These types of cuts are lowest in fat and cholesterol. Chicken (without the skin), fish, veal, and  ground [place] breast are great choices. Eliminate fatty meats, such as hot dogs and salami. Even shellfish have little or no saturated fat. Have a 3 oz (85 g) portion when you eat lean meat, poultry, or fish. Trans fats are also called "partially hydrogenated oils." They are oils that have been scientifically manipulated so that they are solid at room temperature resulting in a longer shelf life and improved taste and texture of foods in which they are added. Trans fats are found in stick margarine, some tub margarines, cookies, crackers, and baked goods.  When baking and cooking, oils are a great substitute for butter. The monounsaturated oils are especially beneficial since it is believed they lower LDL and raise HDL. The oils you should avoid entirely are saturated tropical oils, such as coconut and palm.  Remember to eat a lot from food groups that are naturally free of saturated and trans fat, including fish, fruit, vegetables, beans, grains (barley, rice, couscous, bulgur wheat), and pasta (without cream sauces).  IDENTIFYING FOODS THAT LOWER FAT AND CHOLESTEROL  Soluble fiber may lower your cholesterol. This type of fiber is found in fruits such as apples, vegetables such as broccoli, potatoes, and carrots, legumes such as beans, peas, and lentils, and grains such as barley. Foods fortified with plant sterols (phytosterol) may also lower cholesterol. You should eat at least 2 g per day of these foods for a cholesterol lowering effect.  Read package labels to identify low-saturated fats, trans fat free, and low-fat foods at the supermarket. Select cheeses that have only 2 to 3 g saturated fat per ounce.  Use a heart-healthy tub margarine that is free of trans fats or partially hydrogenated oil. When buying baked goods (cookies, crackers), avoid partially hydrogenated oils. Breads and muffins should be made from whole grains (whole-wheat or whole oat flour, instead of "flour" or "enriched flour"). Buy  non-creamy canned soups with reduced salt and no added fats.  FOOD PREPARATION TECHNIQUES  Never deep-fry. If you must fry, either stir-fry, which uses very little fat, or use non-stick cooking sprays. When possible, broil, bake, or roast meats, and steam vegetables. Instead of putting butter or margarine on vegetables, use lemon and herbs, applesauce, and cinnamon (for squash and sweet potatoes). Use nonfat yogurt, salsa, and low-fat dressings for salads.  LOW-SATURATED FAT / LOW-FAT FOOD SUBSTITUTES Meats / Saturated Fat (g)  Avoid: Steak, marbled (3 oz/85 g) / 11 g  Choose: Steak, lean (3 oz/85 g) / 4 g  Avoid: Hamburger (3 oz/85 g) / 7 g  Choose: Hamburger, lean (3 oz/85 g) / 5 g  Avoid: Ham (3 oz/85 g) / 6 g  Choose: Ham, lean cut (3 oz/85 g) / 2.4 g  Avoid: Chicken, with skin, dark meat (3 oz/85 g) / 4 g  Choose: Chicken, skin removed, dark meat (3 oz/85 g) / 2 g  Avoid: Chicken, with skin, light meat (3 oz/85 g) / 2.5 g  Choose: Chicken, skin removed, light meat (3 oz/85 g) / 1 g Dairy / Saturated Fat (g)  Avoid: Whole milk (1 cup) / 5 g  Choose: Low-fat milk, 2% (1 cup) / 3 g  Choose: Low-fat milk, 1% (1 cup) / 1.5 g  Choose: Skim milk (1 cup) / 0.3 g  Avoid: Hard cheese (1 oz/28 g) / 6 g  Choose: Skim milk cheese (1 oz/28 g) / 2 to 3 g  Avoid: Cottage cheese, 4% fat (1 cup) / 6.5 g  Choose: Low-fat cottage cheese, 1% fat (1 cup) / 1.5 g  Avoid: Ice cream (1 cup) / 9 g  Choose: Sherbet (1 cup) / 2.5 g  Choose: Nonfat frozen yogurt (1 cup) / 0.3 g  Choose: Frozen fruit bar / trace  Avoid: Whipped cream (1 tbs) / 3.5 g  Choose: Nondairy whipped topping (1 tbs) / 1 g Condiments / Saturated Fat (g)  Avoid: Mayonnaise (1 tbs) / 2 g  Choose: Low-fat mayonnaise (1 tbs) / 1 g  Avoid: Butter (1 tbs) / 7 g  Choose: Extra light margarine (1 tbs) / 1 g  Avoid: Coconut oil (1 tbs) / 11.8 g  Choose: Olive oil (1 tbs) / 1.8 g  Choose: Corn oil (1 tbs) /  1.7 g  Choose: Safflower oil (1 tbs) / 1.2 g  Choose: Sunflower oil (1 tbs) / 1.4 g  Choose: Soybean oil (1 tbs) / 2.4 g  Choose: Canola oil (1 tbs) / 1 g Document Released: 01/13/2005 Document Revised: 05/10/2012 Document Reviewed: 04/13/2013 ExitCare Patient Information 2015 [place], [place]. This information is not intended to replace advice given to you by your health care provider. Make sure you discuss any questions you have with your health care provider.

## 2014-09-29 LAB — HEPATITIS A ANTIBODY, TOTAL: Hep A Total Ab: NONREACTIVE

## 2014-09-29 LAB — HEPATITIS C ANTIBODY: HCV AB: NEGATIVE

## 2014-09-29 LAB — MITOCHONDRIAL ANTIBODIES: MITOCHONDRIAL M2 AB, IGG: 1.4 — AB (ref <0.91)

## 2014-09-29 LAB — TISSUE TRANSGLUTAMINASE, IGA: Tissue Transglutaminase Ab, IgA: 1 U/mL (ref <4)

## 2014-09-29 LAB — HEPATITIS B SURFACE ANTIGEN: Hepatitis B Surface Ag: NEGATIVE

## 2014-09-29 LAB — ANA: ANA: NEGATIVE

## 2014-09-29 LAB — ANTI-SMOOTH MUSCLE ANTIBODY, IGG: SMOOTH MUSCLE AB: 10 U (ref <20)

## 2014-09-29 LAB — AFP TUMOR MARKER: AFP TUMOR MARKER: 6.2 ng/mL — AB (ref <6.1)

## 2014-09-29 LAB — HEPATITIS B SURFACE ANTIBODY,QUALITATIVE: Hep B S Ab: NEGATIVE

## 2014-10-03 LAB — CERULOPLASMIN: Ceruloplasmin: 34 mg/dL (ref 18–36)

## 2014-10-03 LAB — ALPHA-1-ANTITRYPSIN: A1 ANTITRYPSIN SER: 254 mg/dL — AB (ref 83–199)

## 2014-10-05 ENCOUNTER — Other Ambulatory Visit: Payer: Self-pay | Admitting: *Deleted

## 2014-10-05 DIAGNOSIS — R7989 Other specified abnormal findings of blood chemistry: Secondary | ICD-10-CM

## 2014-10-05 DIAGNOSIS — R160 Hepatomegaly, not elsewhere classified: Secondary | ICD-10-CM

## 2014-10-05 DIAGNOSIS — R945 Abnormal results of liver function studies: Principal | ICD-10-CM

## 2014-10-05 MED ORDER — LACTULOSE 10 GM/15ML PO SOLN: ORAL | Status: DC

## 2014-10-11 ENCOUNTER — Encounter: Payer: Self-pay | Admitting: Gastroenterology

## 2014-10-11 ENCOUNTER — Ambulatory Visit (AMBULATORY_SURGERY_CENTER): Payer: Medicare Other | Admitting: Gastroenterology

## 2014-10-11 VITALS — BP 128/68 | HR 64 | Temp 98.7°F | Resp 12 | Ht 65.0 in | Wt 200.0 lb

## 2014-10-11 DIAGNOSIS — K2950 Unspecified chronic gastritis without bleeding: Secondary | ICD-10-CM | POA: Diagnosis not present

## 2014-10-11 DIAGNOSIS — K219 Gastro-esophageal reflux disease without esophagitis: Secondary | ICD-10-CM | POA: Diagnosis not present

## 2014-10-11 DIAGNOSIS — K7460 Unspecified cirrhosis of liver: Secondary | ICD-10-CM | POA: Diagnosis present

## 2014-10-11 DIAGNOSIS — R933 Abnormal findings on diagnostic imaging of other parts of digestive tract: Secondary | ICD-10-CM | POA: Diagnosis not present

## 2014-10-11 MED ORDER — SODIUM CHLORIDE 0.9 % IV SOLN: 500.0000 mL | INTRAVENOUS | Status: DC

## 2014-10-11 NOTE — Progress Notes (Signed)
Called to room to assist during endoscopic procedure.  Patient ID and intended procedure confirmed with present staff. Received instructions for my participation in the procedure from the performing physician.  

## 2014-10-11 NOTE — Op Note (Signed)
[facility]  [address]. [place], [address]   ENDOSCOPY PROCEDURE REPORT  PATIENT: Jeff Ward, Jeff Ward  MR#: [id number] BIRTHDATE: [birth year] , 1  yrs. old GENDER: male ENDOSCOPIST: Ladene Artist, MD, Marval Regal REFERRED BY:  Arnette Norris, M.D. PROCEDURE DATE:  10/11/2014 PROCEDURE:  EGD w/ biopsy ASA CLASS:     Class II INDICATIONS:  GERD, cirrhosis and screening for varices. MEDICATIONS: Monitored anesthesia care and Propofol 13 mg IV TOPICAL ANESTHETIC: none DESCRIPTION OF PROCEDURE: After the risks benefits and alternatives of the procedure were thoroughly explained, informed consent was obtained.  The LB [id number] endoscope was introduced through the mouth and advanced to the second portion of the duodenum , Without limitations.  The instrument was slowly withdrawn as the mucosa was fully examined.    ESOPHAGUS: There were 3 columns of Grade I varices in the distal esophagus.   There was LA Class B esophagitis (One or more mucosal breaks > 29mm, but without continuity across mucosal folds) noted. Esophagus otherwise appeared normal. STOMACH: Moderate gastritis vs gastropathy was found in the gastric fundus, gastric body, and gastric antrum.  Multiple biopsies were performed.   The stomach otherwise appeared normal. DUODENUM: The duodenal mucosa showed no abnormalities in the bulb and 2nd part of the duodenum.  Retroflexed views revealed a small hiatal hernia.   The scope was then withdrawn from the patient and the procedure completed.  COMPLICATIONS: There were no immediate complications.  ENDOSCOPIC IMPRESSION: 1.   LA Class B esophagitis 2.   Gastritis vs gastropathy in the gastric fundus, gastric body, and gastric antrum; multiple biopsies performed 3.   Grade I esophageal varices 4   Small hiatal hernia  RECOMMENDATIONS: 1.  Anti-reflux regimen 2.  Await pathology results 3.  Continue PPI daily  eSigned:  Ladene Artist, MD, [facility] 10/11/2014  9:16 AM   [C

## 2014-10-11 NOTE — Patient Instructions (Signed)
YOU HAD AN ENDOSCOPIC PROCEDURE TODAY AT [facility]:   Refer to the procedure report that was given to you for any specific questions about what was found during the examination.  If the procedure report does not answer your questions, please call your gastroenterologist to clarify.  If you requested that your care partner not be given the details of your procedure findings, then the procedure report has been included in a sealed envelope for you to review at your convenience later.  YOU SHOULD EXPECT: Some feelings of bloating in the abdomen. Passage of more gas than usual.  Walking can help get rid of the air that was put into your GI tract during the procedure and reduce the bloating. If you had a lower endoscopy (such as a colonoscopy or flexible sigmoidoscopy) you may notice spotting of blood in your stool or on the toilet paper. If you underwent a bowel prep for your procedure, you may not have a normal bowel movement for a few days.  Please Note:  You might notice some irritation and congestion in your nose or some drainage.  This is from the oxygen used during your procedure.  There is no need for concern and it should clear up in a day or so.  SYMPTOMS TO REPORT IMMEDIATELY:   Following upper endoscopy (EGD)  Vomiting of blood or coffee ground material  New chest pain or pain under the shoulder blades  Painful or persistently difficult swallowing  New shortness of breath  Fever of 100F or higher  Black, tarry-looking stools  For urgent or emergent issues, a gastroenterologist can be reached at any hour by calling [phone].   DIET: Your first meal following the procedure should be a small meal and then it is ok to progress to your normal diet. Heavy or fried foods are harder to digest and may make you feel nauseous or bloated.  Likewise, meals heavy in dairy and vegetables can increase bloating.  Drink plenty of fluids but you should avoid alcoholic beverages for  24 hours.  ACTIVITY:  You should plan to take it easy for the rest of today and you should NOT DRIVE or use heavy machinery until tomorrow (because of the sedation medicines used during the test).    FOLLOW UP: Our staff will call the number listed on your records the next business day following your procedure to check on you and address any questions or concerns that you may have regarding the information given to you following your procedure. If we do not reach you, we will leave a message.  However, if you are feeling well and you are not experiencing any problems, there is no need to return our call.  We will assume that you have returned to your regular daily activities without incident.  If any biopsies were taken you will be contacted by phone or by letter within the next 1-3 weeks.  Please call us at [phone] if you have not heard about the biopsies in 3 weeks.    SIGNATURES/CONFIDENTIALITY: You and/or your care partner have signed paperwork which will be entered into your electronic medical record.  These signatures attest to the fact that that the information above on your After Visit Summary has been reviewed and is understood.  Full responsibility of the confidentiality of this discharge information lies with you and/or your care-partner.  Await pathology results. Please review gastritis and hiatal hernia handouts provided.

## 2014-10-11 NOTE — Progress Notes (Signed)
Report to PACU, RN, vss, BBS= Clear.  

## 2014-10-12 ENCOUNTER — Telehealth: Payer: Self-pay | Admitting: *Deleted

## 2014-10-12 NOTE — Telephone Encounter (Signed)
  Follow up Call-  Call back number 10/11/2014 07/19/2013  Post procedure Call Back phone  # [phone] or [phone] (leave a message at this #) [phone]  Permission to leave phone message Yes Yes     Patient questions:  Do you have a fever, pain , or abdominal swelling? No. Pain Score  0 *  Have you tolerated food without any problems? Yes.    Have you been able to return to your normal activities? Yes.    Do you have any questions about your discharge instructions: Diet   No. Medications  No. Follow up visit  No.  Do you have questions or concerns about your Care? No.  Actions: * If pain score is 4 or above: No action needed, pain <4.

## 2014-10-17 ENCOUNTER — Other Ambulatory Visit: Payer: Self-pay | Admitting: Radiology

## 2014-10-17 ENCOUNTER — Encounter: Payer: Self-pay | Admitting: Gastroenterology

## 2014-10-18 ENCOUNTER — Ambulatory Visit (HOSPITAL_COMMUNITY)
Admission: RE | Admit: 2014-10-18 | Discharge: 2014-10-18 | Disposition: A | Discharge status: Home / Self Care | Payer: Medicare Other | Source: Ambulatory Visit | Attending: Physician Assistant | Admitting: Physician Assistant

## 2014-10-18 ENCOUNTER — Encounter (HOSPITAL_COMMUNITY): Payer: Self-pay

## 2014-10-18 ENCOUNTER — Other Ambulatory Visit: Payer: Self-pay | Admitting: Radiology

## 2014-10-18 DIAGNOSIS — Z79899 Other long term (current) drug therapy: Secondary | ICD-10-CM | POA: Insufficient documentation

## 2014-10-18 DIAGNOSIS — Z882 Allergy status to sulfonamides status: Secondary | ICD-10-CM | POA: Insufficient documentation

## 2014-10-18 DIAGNOSIS — Z87891 Personal history of nicotine dependence: Secondary | ICD-10-CM | POA: Diagnosis not present

## 2014-10-18 DIAGNOSIS — R945 Abnormal results of liver function studies: Secondary | ICD-10-CM | POA: Diagnosis not present

## 2014-10-18 DIAGNOSIS — R740 Nonspecific elevation of levels of transaminase and lactic acid dehydrogenase [LDH]: Secondary | ICD-10-CM | POA: Diagnosis present

## 2014-10-18 DIAGNOSIS — K7581 Nonalcoholic steatohepatitis (NASH): Secondary | ICD-10-CM | POA: Insufficient documentation

## 2014-10-18 DIAGNOSIS — F329 Major depressive disorder, single episode, unspecified: Secondary | ICD-10-CM | POA: Diagnosis not present

## 2014-10-18 DIAGNOSIS — R160 Hepatomegaly, not elsewhere classified: Secondary | ICD-10-CM | POA: Diagnosis not present

## 2014-10-18 DIAGNOSIS — E785 Hyperlipidemia, unspecified: Secondary | ICD-10-CM | POA: Diagnosis not present

## 2014-10-18 DIAGNOSIS — K219 Gastro-esophageal reflux disease without esophagitis: Secondary | ICD-10-CM | POA: Insufficient documentation

## 2014-10-18 DIAGNOSIS — I10 Essential (primary) hypertension: Secondary | ICD-10-CM | POA: Insufficient documentation

## 2014-10-18 DIAGNOSIS — K740 Hepatic fibrosis: Secondary | ICD-10-CM | POA: Diagnosis not present

## 2014-10-18 LAB — CBC WITH DIFFERENTIAL/PLATELET
Basophils Absolute: 0.1 10*3/uL (ref 0.0–0.1)
Basophils Relative: 1 %
EOS ABS: 0.2 10*3/uL (ref 0.0–0.7)
EOS PCT: 3 %
HCT: 40.8 % (ref 39.0–52.0)
Hemoglobin: 13.8 g/dL (ref 13.0–17.0)
LYMPHS ABS: 1.7 10*3/uL (ref 0.7–4.0)
Lymphocytes Relative: 23 %
MCH: 34.2 pg — AB (ref 26.0–34.0)
MCHC: 33.8 g/dL (ref 30.0–36.0)
MCV: 101.2 fL — ABNORMAL HIGH (ref 78.0–100.0)
Monocytes Absolute: 0.9 10*3/uL (ref 0.1–1.0)
Monocytes Relative: 13 %
Neutro Abs: 4.4 10*3/uL (ref 1.7–7.7)
Neutrophils Relative %: 60 %
PLATELETS: 240 10*3/uL (ref 150–400)
RBC: 4.03 MIL/uL — AB (ref 4.22–5.81)
RDW: 13.6 % (ref 11.5–15.5)
WBC: 7.3 10*3/uL (ref 4.0–10.5)

## 2014-10-18 LAB — PROTIME-INR
INR: 1.33 (ref 0.00–1.49)
PROTHROMBIN TIME: 16.6 s — AB (ref 11.6–15.2)

## 2014-10-18 LAB — COMPREHENSIVE METABOLIC PANEL
ALBUMIN: 3.7 g/dL (ref 3.5–5.0)
ALT: 46 U/L (ref 17–63)
ANION GAP: 9 (ref 5–15)
AST: 96 U/L — ABNORMAL HIGH (ref 15–41)
Alkaline Phosphatase: 87 U/L (ref 38–126)
BILIRUBIN TOTAL: 1 mg/dL (ref 0.3–1.2)
BUN: 12 mg/dL (ref 6–20)
CO2: 25 mmol/L (ref 22–32)
Calcium: 8.9 mg/dL (ref 8.9–10.3)
Chloride: 102 mmol/L (ref 101–111)
Creatinine, Ser: 0.83 mg/dL (ref 0.61–1.24)
GFR calc Af Amer: >60 mL/min (ref >60)
GLUCOSE: 145 mg/dL — AB (ref 65–99)
POTASSIUM: 3.9 mmol/L (ref 3.5–5.1)
Sodium: 136 mmol/L (ref 135–145)
TOTAL PROTEIN: 7.6 g/dL (ref 6.5–8.1)

## 2014-10-18 LAB — APTT: APTT: 43 s — AB (ref 24–37)

## 2014-10-18 MED ORDER — FENTANYL CITRATE (PF) 100 MCG/2ML IJ SOLN
INTRAMUSCULAR | Status: AC
  Filled 2014-10-18: qty 4

## 2014-10-18 MED ORDER — MIDAZOLAM HCL 2 MG/2ML IJ SOLN
INTRAMUSCULAR | Status: AC | PRN
  Administered 2014-10-18 (×2): 1 mg via INTRAVENOUS

## 2014-10-18 MED ORDER — SODIUM CHLORIDE 0.9 % IV SOLN
INTRAVENOUS | Status: DC
  Administered 2014-10-18: 11:00:00 via INTRAVENOUS

## 2014-10-18 MED ORDER — FENTANYL CITRATE (PF) 100 MCG/2ML IJ SOLN
INTRAMUSCULAR | Status: AC | PRN
  Administered 2014-10-18: 50 ug via INTRAVENOUS

## 2014-10-18 MED ORDER — MIDAZOLAM HCL 2 MG/2ML IJ SOLN
INTRAMUSCULAR | Status: AC
  Filled 2014-10-18: qty 6

## 2014-10-18 NOTE — Procedures (Signed)
Interventional Radiology Procedure Note  Procedure:  US guided percutaneous liver biopsy, medical liver for transaminitis.  3 x 18G core. Complications: None Recommendations:  - Ok to shower tomorrow - follow up biopsy result - Routine care   Signed,  Dulcy Fanny. Earleen Newport, DO

## 2014-10-18 NOTE — Discharge Instructions (Signed)
Liver Biopsy, Care After °Refer to this sheet in the next few weeks. These instructions provide you with information on caring for yourself after your procedure. Your health care provider may also give you more specific instructions. Your treatment has been planned according to current medical practices, but problems sometimes occur. Call your health care provider if you have any problems or questions after your procedure. °WHAT TO EXPECT AFTER THE PROCEDURE °After your procedure, it is typical to have the following: °· A small amount of discomfort in the area where the biopsy was done and in the right shoulder or shoulder blade. °· A small amount of bruising around the area where the biopsy was done and on the skin over the liver. °· Sleepiness and fatigue for the rest of the day. °HOME CARE INSTRUCTIONS  °· Rest at home for 1-2 days or as directed by your health care provider. °· Have a friend or family member stay with you for at least 24 hours. °· Because of the medicines used during the procedure, you should not do the following things in the first 24 hours: °¨ Drive. °¨ Use machinery. °¨ Be responsible for the care of other people. °¨ Sign legal documents. °¨ Take a bath or shower. °· There are many different ways to close and cover an incision, including stitches, skin glue, and adhesive strips. Follow your health care provider's instructions on: °¨ Incision care. °¨ Bandage (dressing) changes and removal. °¨ Incision closure removal. °· Do not drink alcohol in the first week. °· Do not lift more than 5 pounds or play contact sports for 2 weeks after this test. °· Take medicines only as directed by your health care provider. Do not take medicine containing aspirin or non-steroidal anti-inflammatory medicines such as ibuprofen for 1 week after this test. °· It is your responsibility to get your test results. °SEEK MEDICAL CARE IF:  °· You have increased bleeding from an incision that results in more than a  small spot of blood. °· You have redness, swelling, or increasing pain in any incisions. °· You notice a discharge or a bad smell coming from any of your incisions. °· You have a fever or chills. °SEEK IMMEDIATE MEDICAL CARE IF:  °· You develop swelling, bloating, or pain in your abdomen. °· You become dizzy or faint. °· You develop a rash. °· You are nauseous or vomit. °· You have difficulty breathing, feel short of breath, or feel faint. °· You develop chest pain. °· You have problems with your speech or vision. °· You have trouble balancing or moving your arms or legs. °Document Released: 08/02/2004 Document Revised: 05/30/2013 Document Reviewed: 03/11/2013 °ExitCare® Patient Information ©2015 [employer]. This information is not intended to replace advice given to you by your health care provider. Make sure you discuss any questions you have with your health care provider. °Conscious Sedation °Sedation is the use of medicines to promote relaxation and relieve discomfort and anxiety. Conscious sedation is a type of sedation. Under conscious sedation you are less alert than normal but are still able to respond to instructions or stimulation. Conscious sedation is used during short medical and dental procedures. It is milder than deep sedation or general anesthesia and allows you to return to your regular activities sooner.  °LET YOUR HEALTH CARE PROVIDER KNOW ABOUT:  °· Any allergies you have. °· All medicines you are taking, including vitamins, herbs, eye drops, creams, and over-the-counter medicines. °· Use of steroids (by mouth or creams). °·   Previous problems you or members of your family have had with the use of anesthetics. °· Any blood disorders you have. °· Previous surgeries you have had. °· Medical conditions you have. °· Possibility of pregnancy, if this applies. °· Use of cigarettes, alcohol, or illegal drugs. °RISKS AND COMPLICATIONS °Generally, this is a safe procedure. However, as with any  procedure, problems can occur. Possible problems include: °· Oversedation. °· Trouble breathing on your own. You may need to have a breathing tube until you are awake and breathing on your own. °· Allergic reaction to any of the medicines used for the procedure. °BEFORE THE PROCEDURE °· You may have blood tests done. These tests can help show how well your kidneys and liver are working. They can also show how well your blood clots. °· A physical exam will be done.   °· Only take medicines as directed by your health care provider. You may need to stop taking medicines (such as blood thinners, aspirin, or nonsteroidal anti-inflammatory drugs) before the procedure.   °· Do not eat or drink at least 6 hours before the procedure or as directed by your health care provider. °· Arrange for a responsible adult, family member, or friend to take you home after the procedure. He or she should stay with you for at least 24 hours after the procedure, until the medicine has worn off. °PROCEDURE  °· An intravenous (IV) catheter will be inserted into one of your veins. Medicine will be able to flow directly into your body through this catheter. You may be given medicine through this tube to help prevent pain and help you relax. °· The medical or dental procedure will be done. °AFTER THE PROCEDURE °· You will stay in a recovery area until the medicine has worn off. Your blood pressure and pulse will be checked.   °·  Depending on the procedure you had, you may be allowed to go home when you can tolerate liquids and your pain is under control. °Document Released: 10/08/2000 Document Revised: 01/18/2013 Document Reviewed: 09/20/2012 °ExitCare® Patient Information ©2015 [employer]. This information is not intended to replace advice given to you by your health care provider. Make sure you discuss any questions you have with your health care provider. ° °

## 2014-10-18 NOTE — H&P (Signed)
Chief Complaint: Patient was seen in consultation today for abnormal LFTs at the request of Hvozdovic,Lori P  Referring Physician(s): Hvozdovic,Lori P  History of Present Illness: Jeff Ward is a 64 y.o. male   Pt with abnormal liver function tests x 1 mo Noted hepatomegaly US reveals fatty changes Now scheduled for random liver biopsy  Past Medical History  Diagnosis Date  . 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 Hyperlipidemia 06/2002  . Hypertension   . Arthritis     In back and all joints  . Depression   . Chronic headache   . GI bleed   . Kidney stones   . GERD (gastroesophageal reflux disease)   . Hiatal hernia   . Esophageal varices     Past Surgical History  Procedure Laterality Date  . Carpal tunnel release  2005     / right hand  . Tonsillectomy and adenoidectomy      64 years old  . Vasectomy      Allergies: Sulfonamide derivatives  Medications: Prior to Admission medications   Medication Sig Start Date End Date Taking? Authorizing [name]  diclofenac (VOLTAREN) 75 MG EC tablet Take 1 tablet by mouth  twice a day 05/10/14  Yes [name], MD  DULoxetine (CYMBALTA) 30 MG capsule TAKE 1 CAPSULE (30 MG TOTAL) BY MOUTH DAILY. 05/10/14  Yes [name], MD  fenofibrate (TRICOR) 145 MG tablet TAKE 1 TABLET BY MOUTH DAILY 03/20/14  Yes [name], MD  glucosamine-chondroitin 500-400 MG tablet Take 1 tablet by mouth 2 (two) times daily.     Yes Historical [name], MD  hydrochlorothiazide (HYDRODIURIL) 25 MG tablet Take 1 tablet (25 mg total) by mouth daily. 09/18/14  Yes [name], MD  lactulose (CHRONULAC) 10 GM/15ML solution Take 30 ml po BID 10/05/14  Yes [name], PA-C  lansoprazole (PREVACID) 30 MG capsule Take 1 capsule by mouth  daily Patient taking differently: Take 30 mg by mouth 2 (two) times daily. Take 1 capsule by mouth  daily 05/10/14  Yes [name], MD  lisinopril (PRINIVIL,ZESTRIL) 40 MG tablet Take  1 tablet by mouth  daily 09/06/14  Yes [name], MD  meloxicam (MOBIC) 7.5 MG tablet Take 1 tablet by mouth  twice a day 06/02/14  Yes [name], MD  Multiple Vitamin (MULTIVITAMIN) tablet Take 1 tablet by mouth daily. Mens MVI-Take one daily   Yes Historical [name], MD  Potassium 99 MG TABS Take 1 tablet by mouth 2 (two) times daily.     Yes Historical [name], MD  Tetrahydrozoline HCl (VISINE OP) Apply 1 drop to eye daily as needed (dry eyes).   Yes Historical [name], MD  EPINEPHrine (EPI-PEN) 0.3 mg/0.3 mL SOAJ injection Inject 0.3 mLs (0.3 mg total) into the muscle once. 02/17/13   [name], MD  ibuprofen (ADVIL,MOTRIN) 200 MG tablet Take 200-400 mg by mouth every 6 (six) hours as needed for moderate pain.    Historical [name], MD  meclizine (ANTIVERT) 25 MG tablet Take 1 tablet (25 mg total) by mouth 3 (three) times daily as needed for dizziness or nausea. Patient taking differently: Take 25 mg by mouth daily as needed for dizziness or nausea.  08/26/14 08/26/15  [name], MD     Family History  Problem Relation Age of Onset  . Cancer Father     history of melanona  . Cancer Paternal Aunt     breast  .  Alcohol abuse Maternal Grandfather     Social History   Social History  . Marital Status: Married    Spouse Name: N/A  . Number of Children: 2  . Years of Education: N/A   Occupational History  . [employer]-Maintence     currently unemployed  .     Social History Main Topics  . Smoking status: Former Smoker -- 35 years    Types: Cigars    Quit date: 04/02/2010  . Smokeless tobacco: Never Used     Comment: smoked cigars 1 to 2 daily  . Alcohol Use: 1.2 oz/week    2 Cans of beer per week     Comment: 2 beers a day  . Drug Use: No  . Sexual Activity:    Partners: Female     Comment: declined condoms   Other Topics Concern  . None   Social History Narrative     Review of Systems: A 12 point ROS discussed and pertinent positives are  indicated in the HPI above.  All other systems are negative.  Review of Systems  Constitutional: Negative for activity change.  Respiratory: Negative for shortness of breath.   Gastrointestinal: Positive for abdominal pain. Negative for nausea and vomiting.  Neurological: Negative for weakness.  Psychiatric/Behavioral: Negative for behavioral problems and confusion.    Vital Signs: Ht 5\' 5"  (1.651 m)  Wt 200 lb (90.719 kg)  BMI 33.28 kg/m2  Physical Exam  Constitutional: He is oriented to person, place, and time.  Cardiovascular: Normal rate, regular rhythm and normal heart sounds.   Pulmonary/Chest: Effort normal and breath sounds normal. He has no wheezes.  Abdominal: Soft. Bowel sounds are normal. There is tenderness.  Musculoskeletal: Normal range of motion.  Neurological: He is alert and oriented to person, place, and time.  Skin: Skin is warm and dry.  Psychiatric: He has a normal mood and affect. His behavior is normal. Judgment and thought content normal.  Nursing note and vitals reviewed.   Mallampati Score:  MD Evaluation Airway: WNL Heart: WNL Abdomen: WNL Chest/ Lungs: WNL ASA  Classification: 2 Mallampati/Airway Score: Two  Imaging: Ct Abdomen Pelvis W Contrast  09/20/2014   CLINICAL DATA:  Mid abdominal epigastric pain for 1 week, enlarged liver on exam, abnormal LFTs, ventral hernia, history hypertension, hyperlipidemia, former smoker  EXAM: CT ABDOMEN AND PELVIS WITH CONTRAST  TECHNIQUE: Multidetector CT imaging of the abdomen and pelvis was performed using the standard protocol following bolus administration of intravenous contrast. Sagittal and coronal MPR images reconstructed from axial data set.  CONTRAST:  160mL OMNIPAQUE IOHEXOL 300 MG/ML SOLN IV. Dilute oral contrast.  COMPARISON:  None  FINDINGS: Lung bases clear.  Hepatic margins appear nodular/irregular highly suspicious for cirrhosis.  No definite focal hepatic abnormalities otherwise seen.  Spleen,  pancreas, and adrenal glands normal in appearance.  Small amount of pericholecystic infiltration, gallbladder partially contracted.  Circumaortic LEFT renal vein.  Symmetric nephrograms with cyst at upper pole RIGHT kidney.  Small amount of perihepatic ascites.  Dilated inguinal canals bilaterally containing fat question hernias.  Unremarkable bladder and ureters.  Appendix coiled adjacent to cecum, with cecal wall minimally thickened at cecal tip.  Minimal distal colonic diverticulosis without evidence of diverticulitis.  Stomach and bowel loops otherwise normal appearance.  Scattered atherosclerotic calcifications.  No mass, adenopathy, free air or acute bone lesion.  IMPRESSION: Suspected cirrhotic liver with small amount of perihepatic ascites.  BILATERAL inguinal hernias containing fat.  Mild thickening of the wall  of the cecal tip, potentially an artifact from underdistention but colonoscopy recommended to exclude other causes of wall thickening including tumor.  Minimal distal colonic diverticulosis without evidence of diverticulitis.  Partially contracted gallbladder with pericholecystic infiltration, cannot exclude acute cholecystitis in this setting; recommend correlation with hepatobiliary sonography.   Electronically Signed   By: Lavonia Dana M.D.   On: 09/20/2014 15:03   US Abdomen Limited Ruq  09/27/2014   CLINICAL DATA:  Cirrhotic changes of the liver and ascites, contracted gallbladder on abdominal CT scan of September 20, 2014  EXAM: US ABDOMEN LIMITED - RIGHT UPPER QUADRANT  COMPARISON:  Abdominal and pelvic CT scan of September 20, 2014  FINDINGS: Gallbladder:  The gallbladder is adequately distended. No stones or sludge are observed. The gallbladder wall is thickened at 5.7 mm. There is no positive sonographic Murphy's sign.  Common bile duct:  Diameter: 3.9 mm  Liver:  The hepatic echotexture is increased diffusely and is mildly heterogeneous. There is no focal mass nor ductal dilation. The surface  contour cannot be well evaluated. A small amount of ascites is noted adjacent to the liver.  IMPRESSION: Gallbladder wall thickening without evidence of stones or positive sonographic Murphy's sign. Probable fatty infiltrative change of the liver. Early cirrhotic changes may be present. No discrete hepatic mass. Small amount of ascites.   Electronically Signed   By: David  Martinique M.D.   On: 09/27/2014 09:07    Labs:  CBC:  Recent Labs  12/20/13 0907 08/26/14 1652 09/28/14 1510 10/18/14 1125  WBC 5.0 7.2 7.9 7.3  HGB 13.6 14.1 13.2 13.8  HCT 39.2 40.9 38.7* 40.8  PLT  --  163 206.0 240    COAGS:  Recent Labs  09/28/14 1510 10/18/14 1125  INR 1.3* 1.33  APTT  --  43*    BMP:  Recent Labs  12/20/13 0907 08/26/14 1652 09/28/14 1510  NA 140 136 137  K 4.6 4.6 4.3  CL 99 98* 100  CO2 25 29 29   GLUCOSE 106* 121* 116*  BUN 19 19 18   CALCIUM 9.5 9.5 9.6  CREATININE 1.02 0.96 1.06  GFRNONAA 77 >60  --   GFRAA 89 >60  --     LIVER FUNCTION TESTS:  Recent Labs  12/20/13 0907 09/28/14 1510  BILITOT 0.4 1.2  AST 64* 91*  ALT 67* 45  ALKPHOS 66 101  PROT 6.8 7.7  ALBUMIN  --  3.9    TUMOR MARKERS:  Recent Labs  09/28/14 1510  AFPTM 6.2*    Assessment and Plan:  Abnormal LFTs Hepatomegaly Scheduled now for random liver biopsy Risks and Benefits discussed with the patient including, but not limited to bleeding, infection, damage to adjacent structures or low yield requiring additional tests. All of the patient's questions were answered, patient is agreeable to proceed. Consent signed and in chart.   Thank you for this interesting consult.  I greatly enjoyed meeting Jeff Ward and look forward to participating in their care.  A copy of this report was sent to the requesting Kristian Mogg on this date.  Signed: TURPIN,PAMELA A 10/18/2014, 12:45 PM   I spent a total of  30 Minutes   in face to face in clinical consultation, greater than 50% of which was  counseling/coordinating care for random liver bx

## 2014-10-25 ENCOUNTER — Ambulatory Visit (INDEPENDENT_AMBULATORY_CARE_PROVIDER_SITE_OTHER): Payer: Medicare Other | Admitting: Gastroenterology

## 2014-10-25 ENCOUNTER — Encounter: Payer: Self-pay | Admitting: Gastroenterology

## 2014-10-25 VITALS — BP 166/76 | HR 84 | Ht 65.0 in | Wt 193.5 lb

## 2014-10-25 DIAGNOSIS — K2100 Gastro-esophageal reflux disease with esophagitis, without bleeding: Secondary | ICD-10-CM

## 2014-10-25 DIAGNOSIS — K7581 Nonalcoholic steatohepatitis (NASH): Secondary | ICD-10-CM

## 2014-10-25 MED ORDER — LANSOPRAZOLE 30 MG PO CPDR: 30.0000 mg | DELAYED_RELEASE_CAPSULE | Freq: Two times a day (BID) | ORAL | Status: DC

## 2014-10-25 MED ORDER — MECLIZINE HCL 25 MG PO TABS: 25.0000 mg | ORAL_TABLET | Freq: Every day | ORAL | Status: AC | PRN

## 2014-10-25 NOTE — Progress Notes (Signed)
    History of Present Illness: This is a 64 year old male returning for follow-up of her abnormal LFTs following liver biopsy. He is accompanied by his wife. In addition he underwent upper endoscopy which showed small esophageal varices, erosive gastritis and reactive gastropathy. His Prevacid was increased to 30 mg twice daily. He is had an intentional 20 pound weight loss over the past 2 months.   Liver, needle/core biopsy - BENIGN LIVER WITH STEATOHEPATITIS. - BRIDGING STAGE 3 FIBROSIS PRESENT.  Current Medications, Allergies, Past Medical History, Past Surgical History, Family History and Social History were reviewed in [employer] record.  Physical Exam: General: Well developed, well nourished, obese, no acute distress Head: Normocephalic and atraumatic Eyes:  sclerae anicteric, EOMI Ears: Normal auditory acuity Mouth: No deformity or lesions Lungs: Clear throughout to auscultation Heart: Regular rate and rhythm; no murmurs, rubs or bruits Abdomen: Soft, non tender and non distended. No masses, hepatosplenomegaly or hernias noted. Normal Bowel sounds Musculoskeletal: Symmetrical with no gross deformities  Pulses:  Normal pulses noted Extremities: No clubbing, cyanosis, edema or deformities noted Neurological: Alert oriented x 4, grossly nonfocal Psychological:  Alert and cooperative. Normal mood and affect  Assessment and Recommendations:  1. Steatohepatitis with bridging stage III fibrosis and evidence of portal hypertension with small esophageal varices. Continue weight loss program with close follow-up for management of weight and lipids with his PCP. Current BMI=32. Monitor LFTs, chemistries, CBC and PT/INR every 6 months. REV with me in 04/2015. Follow up with PCP in 11/2014.  2. GERD with LA class B erosive esophagitis and reactive gastropathy. Continue Prevacid 30 mg twice daily and standard antireflux measures.

## 2014-10-25 NOTE — Patient Instructions (Addendum)
We have sent the following medications to your pharmacy for you to pick up at your convenience: Prevacid, Bliss. Please schedule a follow up appointment with Dr. Deborra Medina in Nov. 2016. We will follow up with you 6 months after your appointment with Dr Deborra Medina.  Thank you for choosing me and [facility].  Pricilla Riffle. Dagoberto Ligas., MD., Marval Regal

## 2014-12-19 ENCOUNTER — Ambulatory Visit (INDEPENDENT_AMBULATORY_CARE_PROVIDER_SITE_OTHER): Payer: Medicare Other | Admitting: Family Medicine

## 2014-12-19 ENCOUNTER — Encounter: Payer: Self-pay | Admitting: Family Medicine

## 2014-12-19 VITALS — BP 140/72 | HR 80 | Temp 98.7°F | Wt 206.5 lb

## 2014-12-19 DIAGNOSIS — R140 Abdominal distension (gaseous): Secondary | ICD-10-CM | POA: Diagnosis not present

## 2014-12-19 NOTE — Progress Notes (Signed)
Pre visit review using our clinic review tool, if applicable. No additional management support is needed unless otherwise documented below in the visit note. 

## 2014-12-19 NOTE — Patient Instructions (Signed)
  Try over the counter gas-x (four times a day when necessary) or beano for bloating.   Consider trail of lactose free diet (back off milk)   Trial of Align for bloating/IBS symptoms (OTC).

## 2014-12-19 NOTE — Assessment & Plan Note (Signed)
Acute onset- ?imblanace of flora. Trial of Gas x/beno and probiotic- see AVS. Recent endoscopy and colonoscopy reassuring. Does not eat much dairy but advised cutting this back further. Call or return to clinic prn if these symptoms worsen or fail to improve as anticipated. The patient indicates understanding of these issues and agrees with the plan.

## 2014-12-19 NOTE — Progress Notes (Signed)
Subjective:   Patient ID: Jeff Ward, male    DOB: 1950-09-25, 64 y.o.   MRN: [id number]  Damiano A Difranco is a pleasant 64 y.o. year old male who presents to clinic today with Bloated and change in bowels  on 12/19/2014  HPI:  1 week of changes in bowel habits.  Had 3 BMs in one day, then goes days without one.  Very gasy, abdomen feels bloated and distended.  No blood in stool.  No nausea, vomiting or fevers.  Alkaselter has helped some- caused him to belch which felt better. Gas x did not help- not sure if he took enough.  Last colonoscopy 07/19/13- 5 year recall due to polyps (Dr. Fuller Plan). Endoscopy 2 months ago.  Current Outpatient Prescriptions on File Prior to Visit  Medication Sig Dispense Refill  . diclofenac (VOLTAREN) 75 MG EC tablet Take 1 tablet by mouth  twice a day 180 tablet 1  . DULoxetine (CYMBALTA) 30 MG capsule TAKE 1 CAPSULE (30 MG TOTAL) BY MOUTH DAILY. 90 capsule 1  . EPINEPHrine (EPI-PEN) 0.3 mg/0.3 mL SOAJ injection Inject 0.3 mLs (0.3 mg total) into the muscle once. 1 Device 1  . fenofibrate (TRICOR) 145 MG tablet TAKE 1 TABLET BY MOUTH DAILY 90 tablet 0  . glucosamine-chondroitin 500-400 MG tablet Take 1 tablet by mouth 2 (two) times daily.      . hydrochlorothiazide (HYDRODIURIL) 25 MG tablet Take 1 tablet (25 mg total) by mouth daily. 90 tablet 3  . lansoprazole (PREVACID) 30 MG capsule Take 1 capsule (30 mg total) by mouth 2 (two) times daily. Take 1 capsule by mouth  daily 60 capsule 11  . lisinopril (PRINIVIL,ZESTRIL) 40 MG tablet Take 1 tablet by mouth  daily 30 tablet 3  . meclizine (ANTIVERT) 25 MG tablet Take 1 tablet (25 mg total) by mouth daily as needed for dizziness or nausea. 90 tablet 0  . meloxicam (MOBIC) 7.5 MG tablet Take 1 tablet by mouth  twice a day 180 tablet 2  . Multiple Vitamin (MULTIVITAMIN) tablet Take 1 tablet by mouth daily. Mens MVI-Take one daily    . Potassium 99 MG TABS Take 1 tablet by mouth 2 (two) times daily.      .  Tetrahydrozoline HCl (VISINE OP) Apply 1 drop to eye daily as needed (dry eyes).     No current facility-administered medications on file prior to visit.    Allergies  Allergen Reactions  . Lactulose     "stomach issues"  . Sulfonamide Derivatives     REACTION: unconsciousness/dizziness    Past Medical History  Diagnosis Date  . 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 Hyperlipidemia 06/2002  . Hypertension   . Arthritis     In back and all joints  . Depression   . Chronic headache   . GI bleed   . Kidney stones   . GERD (gastroesophageal reflux disease)   . Hiatal hernia   . Esophageal varices (HCC)     Past Surgical History  Procedure Laterality Date  . Carpal tunnel release  2005     / right hand  . Tonsillectomy and adenoidectomy      64 years old  . Vasectomy      Family History  Problem Relation Age of Onset  . Cancer Father     history of melanona  . Cancer Paternal Aunt     breast  . Alcohol abuse Maternal Grandfather  Social History   Social History  . Marital Status: Married    Spouse Name: N/A  . Number of Children: 2  . Years of Education: N/A   Occupational History  . [employer]Maintence     currently unemployed  .     Social History Main Topics  . Smoking status: Former Smoker -- 35 years    Types: Cigars    Quit date: 04/02/2010  . Smokeless tobacco: Never Used     Comment: smoked cigars 1 to 2 daily  . Alcohol Use: 1.2 oz/week    2 Cans of beer per week     Comment: 2 beers a day  . Drug Use: No  . Sexual Activity:    Partners: Female     Comment: declined condoms   Other Topics Concern  . Not on file   Social History Narrative   The PMH, PSH, Social History, Family History, Medications, and allergies have been reviewed in [facility], and have been updated if relevant.      Review of Systems  Constitutional: Negative.   Respiratory: Negative.   Gastrointestinal: Positive for abdominal  pain and abdominal distention. Negative for nausea, vomiting, diarrhea, constipation, blood in stool and rectal pain.  Genitourinary: Negative.   Musculoskeletal: Negative.   Skin: Negative.   Neurological: Negative.   All other systems reviewed and are negative.      Objective:    BP 140/72 mmHg  Pulse 80  Temp(Src) 98.7 F (37.1 C) (Oral)  Wt 206 lb 8 oz (93.668 kg)  SpO2 98%   Physical Exam  Constitutional: He is oriented to person, place, and time. He appears well-developed and well-nourished. No distress.  HENT:  Head: Normocephalic.  Eyes: Conjunctivae are normal.  Cardiovascular: Normal rate.   Pulmonary/Chest: Effort normal.  Abdominal: Soft. Bowel sounds are normal. He exhibits distension. He exhibits no mass. There is no tenderness. There is no rebound and no guarding.  Musculoskeletal: Normal range of motion.  Neurological: He is alert and oriented to person, place, and time. No cranial nerve deficit.  Psychiatric: He has a normal mood and affect. His behavior is normal. Thought content normal.  Nursing note and vitals reviewed.         Assessment & Plan:   Abdominal bloating No Follow-up on file.

## 2015-02-22 ENCOUNTER — Ambulatory Visit: Payer: Medicare Other | Admitting: Family Medicine

## 2015-03-22 ENCOUNTER — Encounter: Payer: Self-pay | Admitting: Family Medicine

## 2015-03-22 ENCOUNTER — Telehealth: Payer: Self-pay | Admitting: Gastroenterology

## 2015-03-22 ENCOUNTER — Ambulatory Visit (INDEPENDENT_AMBULATORY_CARE_PROVIDER_SITE_OTHER): Payer: Medicare Other | Admitting: Family Medicine

## 2015-03-22 VITALS — BP 182/94 | HR 95 | Temp 98.3°F | Wt 191.5 lb

## 2015-03-22 DIAGNOSIS — R1030 Lower abdominal pain, unspecified: Secondary | ICD-10-CM

## 2015-03-22 DIAGNOSIS — R109 Unspecified abdominal pain: Secondary | ICD-10-CM | POA: Insufficient documentation

## 2015-03-22 DIAGNOSIS — F329 Major depressive disorder, single episode, unspecified: Secondary | ICD-10-CM | POA: Diagnosis not present

## 2015-03-22 DIAGNOSIS — I10 Essential (primary) hypertension: Secondary | ICD-10-CM

## 2015-03-22 DIAGNOSIS — E785 Hyperlipidemia, unspecified: Secondary | ICD-10-CM | POA: Diagnosis not present

## 2015-03-22 DIAGNOSIS — M129 Arthropathy, unspecified: Secondary | ICD-10-CM

## 2015-03-22 DIAGNOSIS — F32A Depression, unspecified: Secondary | ICD-10-CM

## 2015-03-22 DIAGNOSIS — K219 Gastro-esophageal reflux disease without esophagitis: Secondary | ICD-10-CM

## 2015-03-22 LAB — COMPREHENSIVE METABOLIC PANEL
ALT: 25 U/L (ref 0–53)
AST: 68 U/L — AB (ref 0–37)
Albumin: 3.4 g/dL — ABNORMAL LOW (ref 3.5–5.2)
Alkaline Phosphatase: 113 U/L (ref 39–117)
BUN: 8 mg/dL (ref 6–23)
CHLORIDE: 101 meq/L (ref 96–112)
CO2: 30 meq/L (ref 19–32)
CREATININE: 0.63 mg/dL (ref 0.40–1.50)
Calcium: 8.8 mg/dL (ref 8.4–10.5)
GFR: 135.59 mL/min (ref >60.00)
Glucose, Bld: 101 mg/dL — ABNORMAL HIGH (ref 70–99)
Potassium: 3.2 mEq/L — ABNORMAL LOW (ref 3.5–5.1)
SODIUM: 139 meq/L (ref 135–145)
Total Bilirubin: 1.1 mg/dL (ref 0.2–1.2)
Total Protein: 7.3 g/dL (ref 6.0–8.3)

## 2015-03-22 LAB — H. PYLORI ANTIBODY, IGG: H Pylori IgG: NEGATIVE

## 2015-03-22 MED ORDER — MELOXICAM 7.5 MG PO TABS: ORAL_TABLET | ORAL | Status: DC

## 2015-03-22 MED ORDER — DICLOFENAC SODIUM 75 MG PO TBEC: DELAYED_RELEASE_TABLET | ORAL | Status: DC

## 2015-03-22 NOTE — Assessment & Plan Note (Signed)
Deteriorated due to non compliance. Has BP cuff at home and is asymptomatic. Advised him to take his meds as soon as he gets home, check it daily and call us with an update over the next couple of days. The patient indicates understanding of these issues and agrees with the plan.

## 2015-03-22 NOTE — Assessment & Plan Note (Signed)
With history of cirrhosis, worsening GERD and abdominal distension. Recheck LFTs and h pylori today. Refer back to GI. The patient indicates understanding of these issues and agrees with the plan.

## 2015-03-22 NOTE — Progress Notes (Signed)
Subjective:   Patient ID: Jeff Ward, male    DOB: 01/08/1951, 66 y.o.   MRN: [id number]  Jeff Ward is a pleasant 65 y.o. year old male who presents to clinic today with Follow-up; Medication Refill; and Abdominal Pain  on 03/22/2015  HPI: GERD- feels Prevacid 30 mg twice daily is no longer effective. Did see Dr. Fuller Plan on 10/25/14- note reviewed.  His assessment was: GERD with LA class B erosive esophagitis and reactive gastropathy. Continue Prevacid 30 mg twice daily and standard antireflux measures  Feels his reflux and abdominal bloating are worsening.  HTN- BP has been under better control with Lisinopril 40 mg daily and HCTZ 25 mg daily - increased by partner last fall. Very elevated today but has not been compliant with his medications. Denies HA, blurred vision, CP or SOB. No LE now but has had some recently.  Having more cramping in his legs.    Lab Results  Component Value Date   CREATININE 0.83 10/18/2014    Current Outpatient Prescriptions on File Prior to Visit  Medication Sig Dispense Refill  . DULoxetine (CYMBALTA) 30 MG capsule TAKE 1 CAPSULE (30 MG TOTAL) BY MOUTH DAILY. 90 capsule 1  . EPINEPHrine (EPI-PEN) 0.3 mg/0.3 mL SOAJ injection Inject 0.3 mLs (0.3 mg total) into the muscle once. 1 Device 1  . fenofibrate (TRICOR) 145 MG tablet TAKE 1 TABLET BY MOUTH DAILY 90 tablet 0  . glucosamine-chondroitin 500-400 MG tablet Take 1 tablet by mouth 2 (two) times daily.      . hydrochlorothiazide (HYDRODIURIL) 25 MG tablet Take 1 tablet (25 mg total) by mouth daily. 90 tablet 3  . lansoprazole (PREVACID) 30 MG capsule Take 1 capsule (30 mg total) by mouth 2 (two) times daily. Take 1 capsule by mouth  daily 60 capsule 11  . lisinopril (PRINIVIL,ZESTRIL) 40 MG tablet Take 1 tablet by mouth  daily 30 tablet 3  . meclizine (ANTIVERT) 25 MG tablet Take 1 tablet (25 mg total) by mouth daily as needed for dizziness or nausea. 90 tablet 0  . Multiple Vitamin  (MULTIVITAMIN) tablet Take 1 tablet by mouth daily. Mens MVI-Take one daily    . Potassium 99 MG TABS Take 1 tablet by mouth 2 (two) times daily.      . Tetrahydrozoline HCl (VISINE OP) Apply 1 drop to eye daily as needed (dry eyes).     No current facility-administered medications on file prior to visit.    Allergies  Allergen Reactions  . Lactulose     "stomach issues"  . Sulfonamide Derivatives     REACTION: unconsciousness/dizziness    Past Medical History  Diagnosis Date  . 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 Hyperlipidemia 06/2002  . Hypertension   . Arthritis     In back and all joints  . Depression   . Chronic headache   . GI bleed   . Kidney stones   . GERD (gastroesophageal reflux disease)   . Hiatal hernia   . Esophageal varices (HCC)     Past Surgical History  Procedure Laterality Date  . Carpal tunnel release  2005     / right hand  . Tonsillectomy and adenoidectomy      65 years old  . Vasectomy      Family History  Problem Relation Age of Onset  . Cancer Father     history of melanona  . Cancer Paternal Aunt     breast  .  Alcohol abuse Maternal Grandfather     Social History   Social History  . Marital Status: Married    Spouse Name: N/A  . Number of Children: 2  . Years of Education: N/A   Occupational History  . [employer]-Maintence     currently unemployed  .     Social History Main Topics  . Smoking status: Former Smoker -- 35 years    Types: Cigars    Quit date: 04/02/2010  . Smokeless tobacco: Never Used     Comment: smoked cigars 1 to 2 daily  . Alcohol Use: 1.2 oz/week    2 Cans of beer per week     Comment: 2 beers a day  . Drug Use: No  . Sexual Activity:    Partners: Female     Comment: declined condoms   Other Topics Concern  . Not on file   Social History Narrative   The PMH, PSH, Social History, Family History, Medications, and allergies have been reviewed in [facility], and have  been updated if relevant.   Review of Systems  Constitutional: Negative.   Eyes: Negative.   Respiratory: Negative.   Cardiovascular: Negative.   Gastrointestinal: Positive for abdominal distention. Negative for nausea, vomiting, abdominal pain, diarrhea, constipation, blood in stool, anal bleeding and rectal pain.  Endocrine: Negative.   Genitourinary: Negative.   Musculoskeletal: Positive for myalgias.  Skin: Negative.   Neurological: Negative.   Hematological: Negative.   Psychiatric/Behavioral: Negative.   All other systems reviewed and are negative.      Objective:    BP 182/94 mmHg  Pulse 95  Temp(Src) 98.3 F (36.8 C) (Oral)  Wt 191 lb 8 oz (86.864 kg)  SpO2 94%   Physical Exam  Constitutional: He is oriented to person, place, and time. He appears well-developed and well-nourished. No distress.  HENT:  Head: Normocephalic.  Eyes: Conjunctivae are normal.  Cardiovascular: Normal rate.   Pulmonary/Chest: Effort normal.  Abdominal: He exhibits distension. He exhibits no mass. There is no tenderness. There is no rebound and no guarding.  Musculoskeletal: Normal range of motion.  Neurological: He is alert and oriented to person, place, and time. No cranial nerve deficit.  Skin: Skin is warm and dry. He is not diaphoretic.  Psychiatric: He has a normal mood and affect. His behavior is normal. Judgment and thought content normal.  Nursing note and vitals reviewed.         Assessment & Plan:   HLD (hyperlipidemia)  Depression  Arthropathy, multiple sites  Gastroesophageal reflux disease, esophagitis presence not specified No Follow-up on file.

## 2015-03-22 NOTE — Telephone Encounter (Signed)
Patient is scheduled with Dr. Fuller Plan for 04/25/15 3:00.  I will place him on the cancellation list.  I left a message for [facility] with the appt details.

## 2015-03-22 NOTE — Progress Notes (Signed)
Pre visit review using our clinic review tool, if applicable. No additional management support is needed unless otherwise documented below in the visit note. 

## 2015-04-05 ENCOUNTER — Telehealth: Payer: Self-pay

## 2015-04-05 NOTE — Telephone Encounter (Signed)
Pt left v/m; pt was seen 03/22/15; abdomen is still distended; no change in distention; pt taking med as instructed;BP is OK. Feet and legs still swollen; pt taking fluid pills twice a day. Pt wants to know what else to do. Pt request cb.

## 2015-04-06 NOTE — Telephone Encounter (Signed)
Spoke to pt and advised per Dr Deborra Medina. Pt states he already has upcoming appt with Dr Fuller Plan

## 2015-04-06 NOTE — Telephone Encounter (Signed)
I'm sorry to hear there has been no improvement.  I would like for him to schedule a follow up with his gastroenterologist, Dr. Fuller Plan for the abdominal distension and pain.

## 2015-04-11 ENCOUNTER — Ambulatory Visit (INDEPENDENT_AMBULATORY_CARE_PROVIDER_SITE_OTHER): Payer: Medicare Other | Admitting: Physician Assistant

## 2015-04-11 ENCOUNTER — Telehealth: Payer: Self-pay | Admitting: Physician Assistant

## 2015-04-11 ENCOUNTER — Encounter: Payer: Self-pay | Admitting: Physician Assistant

## 2015-04-11 ENCOUNTER — Other Ambulatory Visit (INDEPENDENT_AMBULATORY_CARE_PROVIDER_SITE_OTHER): Payer: Medicare Other

## 2015-04-11 ENCOUNTER — Other Ambulatory Visit: Payer: Self-pay | Admitting: *Deleted

## 2015-04-11 VITALS — BP 148/70 | HR 84 | Ht 65.0 in | Wt 198.2 lb

## 2015-04-11 DIAGNOSIS — K7460 Unspecified cirrhosis of liver: Secondary | ICD-10-CM

## 2015-04-11 DIAGNOSIS — R143 Flatulence: Secondary | ICD-10-CM | POA: Diagnosis not present

## 2015-04-11 DIAGNOSIS — IMO0001 Reserved for inherently not codable concepts without codable children: Secondary | ICD-10-CM

## 2015-04-11 DIAGNOSIS — R188 Other ascites: Principal | ICD-10-CM

## 2015-04-11 DIAGNOSIS — R140 Abdominal distension (gaseous): Secondary | ICD-10-CM

## 2015-04-11 LAB — CBC WITH DIFFERENTIAL/PLATELET
BASOS ABS: 0 10*3/uL (ref 0.0–0.1)
Basophils Relative: 0.7 % (ref 0.0–3.0)
EOS ABS: 0.3 10*3/uL (ref 0.0–0.7)
EOS PCT: 3.7 % (ref 0.0–5.0)
HCT: 31.7 % — ABNORMAL LOW (ref 39.0–52.0)
HEMOGLOBIN: 10.3 g/dL — AB (ref 13.0–17.0)
LYMPHS ABS: 1.3 10*3/uL (ref 0.7–4.0)
Lymphocytes Relative: 18.7 % (ref 12.0–46.0)
MCHC: 32.4 g/dL (ref 30.0–36.0)
MCV: 85.2 fl (ref 78.0–100.0)
MONO ABS: 0.9 10*3/uL (ref 0.1–1.0)
Monocytes Relative: 12.6 % — ABNORMAL HIGH (ref 3.0–12.0)
NEUTROS PCT: 64.3 % (ref 43.0–77.0)
Neutro Abs: 4.6 10*3/uL (ref 1.4–7.7)
Platelets: 212 10*3/uL (ref 150.0–400.0)
RBC: 3.72 Mil/uL — AB (ref 4.22–5.81)
RDW: 18.6 % — ABNORMAL HIGH (ref 11.5–15.5)
WBC: 7.1 10*3/uL (ref 4.0–10.5)

## 2015-04-11 LAB — AMMONIA: Ammonia: 70 umol/L — ABNORMAL HIGH (ref 11–35)

## 2015-04-11 LAB — BASIC METABOLIC PANEL
BUN: 13 mg/dL (ref 6–23)
CHLORIDE: 99 meq/L (ref 96–112)
CO2: 29 meq/L (ref 19–32)
Calcium: 8.9 mg/dL (ref 8.4–10.5)
Creatinine, Ser: 0.85 mg/dL (ref 0.40–1.50)
GFR: 95.95 mL/min (ref >60.00)
GLUCOSE: 131 mg/dL — AB (ref 70–99)
POTASSIUM: 3.9 meq/L (ref 3.5–5.1)
SODIUM: 133 meq/L — AB (ref 135–145)

## 2015-04-11 LAB — PROTIME-INR
INR: 1.4 ratio — AB (ref 0.8–1.0)
PROTHROMBIN TIME: 15.3 s — AB (ref 9.6–13.1)

## 2015-04-11 LAB — HEPATIC FUNCTION PANEL
ALT: 16 U/L (ref 0–53)
AST: 47 U/L — AB (ref 0–37)
Albumin: 3.4 g/dL — ABNORMAL LOW (ref 3.5–5.2)
Alkaline Phosphatase: 97 U/L (ref 39–117)
BILIRUBIN DIRECT: 0.8 mg/dL — AB (ref 0.0–0.3)
BILIRUBIN TOTAL: 1.4 mg/dL — AB (ref 0.2–1.2)
Total Protein: 7.4 g/dL (ref 6.0–8.3)

## 2015-04-11 LAB — AMYLASE: Amylase: 57 U/L (ref 27–131)

## 2015-04-11 LAB — MAGNESIUM: MAGNESIUM: 1.7 mg/dL (ref 1.5–2.5)

## 2015-04-11 LAB — LIPASE: LIPASE: 40 U/L (ref 11.0–59.0)

## 2015-04-11 MED ORDER — RIFAXIMIN 550 MG PO TABS: 550.0000 mg | ORAL_TABLET | Freq: Three times a day (TID) | ORAL | Status: DC

## 2015-04-11 NOTE — Telephone Encounter (Signed)
I will send notes as soon as they are complete.

## 2015-04-11 NOTE — Progress Notes (Signed)
Patient ID: Jeff Ward, male   DOB: April 01, 1950, 65 y.o.   MRN: [id number]     History of Present Illness: Jeff Ward is a pleasant 65 year old male who is known to Dr. Fuller Plan. He was evaluated last year for abnormal LFTs and liver biopsy. He underwent an upper endoscopy which showed small esophageal varices, erosive gastritis, reactive gastropathy. Liver biopsy showed benign liver with steatohepatitis and bridging stage III fibrosis. He was instructed to continue her weight loss program and lipid management with his PCP. He presents today with complaints of swelling of his abdomen. He states he is trying to lose weight and is losing it everywhere but in his abdomen his abdomen has become progressively more distended. He also notes that over the past several weeks he has been experiencing more swelling of his legs. He has doubled up on his hydrochlorothiazide but has not notified his PCP. He has also been doubling up on his potassium but has not notified his PCP as well. He complains of excess gas and bloating. He also complained of cramping in his legs. He states his abdomen is so distended that it is sometimes difficult for him to take a deep breath. He has no nausea or vomiting. Bowel movements have been normal with no bright red blood per rectum or melena. He denies confusion but has had intermittent sleepiness. He does feel his energy level is good.   Past Medical History  Diagnosis Date  . 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 Hyperlipidemia 06/2002  . Hypertension   . Arthritis     In back and all joints  . Depression   . Chronic headache   . GI bleed   . Kidney stones   . GERD (gastroesophageal reflux disease)   . Hiatal hernia   . Esophageal varices (HCC)     Past Surgical History  Procedure Laterality Date  . Carpal tunnel release  2005     / right hand  . Tonsillectomy and adenoidectomy      65 years old  . Vasectomy     Family History  Problem  Relation Age of Onset  . Cancer Father     history of melanona  . Cancer Paternal Aunt     breast  . Alcohol abuse Maternal Grandfather    Social History  Substance Use Topics  . Smoking status: Former Smoker -- 35 years    Types: Cigars    Quit date: 04/02/2010  . Smokeless tobacco: Never Used     Comment: smoked cigars 1 to 2 daily  . Alcohol Use: 1.2 oz/week    2 Cans of beer per week     Comment: 2 beers a day   Current Outpatient Prescriptions  Medication Sig Dispense Refill  . diclofenac (VOLTAREN) 75 MG EC tablet Take 1 tablet by mouth  twice a day 180 tablet 1  . DULoxetine (CYMBALTA) 30 MG capsule TAKE 1 CAPSULE (30 MG TOTAL) BY MOUTH DAILY. 90 capsule 1  . EPINEPHrine (EPI-PEN) 0.3 mg/0.3 mL SOAJ injection Inject 0.3 mLs (0.3 mg total) into the muscle once. 1 Device 1  . fenofibrate (TRICOR) 145 MG tablet TAKE 1 TABLET BY MOUTH DAILY 90 tablet 0  . glucosamine-chondroitin 500-400 MG tablet Take 1 tablet by mouth 2 (two) times daily.      . hydrochlorothiazide (HYDRODIURIL) 25 MG tablet Take 1 tablet (25 mg total) by mouth daily. 90 tablet 3  . lansoprazole (PREVACID) 30 MG capsule  Take 1 capsule (30 mg total) by mouth 2 (two) times daily. Take 1 capsule by mouth  daily 60 capsule 11  . lisinopril (PRINIVIL,ZESTRIL) 40 MG tablet Take 1 tablet by mouth  daily 30 tablet 3  . meclizine (ANTIVERT) 25 MG tablet Take 1 tablet (25 mg total) by mouth daily as needed for dizziness or nausea. 90 tablet 0  . meloxicam (MOBIC) 7.5 MG tablet Take 1 tablet by mouth  twice a day 180 tablet 2  . Multiple Vitamin (MULTIVITAMIN) tablet Take 1 tablet by mouth daily. Mens MVI-Take one daily    . Potassium 99 MG TABS Take 1 tablet by mouth 2 (two) times daily.      . Tetrahydrozoline HCl (VISINE OP) Apply 1 drop to eye daily as needed (dry eyes).    . rifaximin (XIFAXAN) 550 MG TABS tablet Take 1 tablet (550 mg total) by mouth 3 (three) times daily. 42 tablet 0   No current  facility-administered medications for this visit.   Allergies  Allergen Reactions  . Lactulose     "stomach issues"  . Sulfonamide Derivatives     REACTION: unconsciousness/dizziness     Review of Systems: Per history of present illness otherwise negative.  Physical Exam: BP 148/70 mmHg  Pulse 84  Ht 5\' 5"  (1.651 m)  Wt 198 lb 4 oz (89.926 kg)  BMI 32.99 kg/m2 General: Pleasant, well developed , male in no acute distress Head: Normocephalic and atraumatic Eyes:  sclerae anicteric, conjunctiva pink  Ears: Normal auditory acuity Lungs: Clear throughout to auscultation Heart: Regular rate and rhythm Abdomen: Soft, distended, non-tender. No masses, no hepatomegaly. Normal bowel sounds. Large rectus diastases noted. Musculoskeletal: Symmetrical with no gross deformities  Extremities: 2+ bilateral pitting edema lower extremities. Neurological: Alert oriented x 4, grossly nonfocal Psychological:  Alert and cooperative. Normal mood and affect  Assessment and Recommendations: 65 year old male with steatohepatitis with bridging stage III fibrosis, portal hypertension, and small esophageal varices, presenting with complaints of increasing abdominal distention and swelling of the lower extremities. Patient has been instructed to adhere to a low sodium diet. An abdominal ultrasound will be obtained to evaluate for ascites. If he is noted to have significant ascites he may be a candidate for paracentesis. He CBC with differential, basic metabolic panel, amylase, lipase, hepatic function panel, ammonia, and PT/INR will be obtained. We will also check his magnesium as he is on a twice a day PPI and is complaining of leg cramps. He will empirically be given a trial of Xifaxan 550 mg 1 by mouth 3 times daily for 14 days for possible bacterial overgrowth. Pending the findings of his ultrasound and laboratory studies, we may consider switching him from hydrochlorothiazide to Lasix and adding  spironolactone. He will follow-up in 3-4 weeks, sooner if needed. Patient has also been encouraged to stop drinking as he states he has 1-2 drinks every night.        Jeff Ward, Deloris Ping 04/11/2015,

## 2015-04-11 NOTE — Patient Instructions (Addendum)
You have been scheduled for an abdominal ultrasound at [facility] outpatient imaging center on  Wednesday, 04-18-15 at 9:30 am. Please arrive 15 minutes prior to your appointment for registration. Make certain not to have anything to eat or drink 6 hours prior to your appointment. Should you need to reschedule your appointment, please contact radiology at [phone]. This test typically takes about 30 minutes to perform.  Your physician has requested that you go to the basement for lab work before leaving today.  We have sent your demographic information and a prescription for Xifaxan to [facility]. This pharmacy is able to get medication approved through insurance and get you the lowest copay possible. If you have not heard from them within 1 week, please call our office at [phone] to let us know.  Please follow up with Jeff Ward or an APP in 3-4 weeks.

## 2015-04-12 ENCOUNTER — Other Ambulatory Visit: Payer: Self-pay | Admitting: *Deleted

## 2015-04-12 MED ORDER — RIFAXIMIN 550 MG PO TABS: 550.0000 mg | ORAL_TABLET | Freq: Two times a day (BID) | ORAL | Status: DC

## 2015-04-13 NOTE — Progress Notes (Signed)
Reviewed and agree with management plan.  Damarious Holtsclaw T. Shirlena Brinegar, MD FACG 

## 2015-04-17 ENCOUNTER — Encounter: Payer: Self-pay | Admitting: Physician Assistant

## 2015-04-17 NOTE — Progress Notes (Signed)
Patient ID: Jeff Ward, male   DOB: 02/19/1950, 65 y.o.   MRN: [id number]   Received fax approval for Xifaxan from [facility] and is approved until 04/30/15 under Medicare part D benefit.

## 2015-04-18 ENCOUNTER — Ambulatory Visit
Admission: RE | Admit: 2015-04-18 | Discharge: 2015-04-18 | Disposition: A | Discharge status: Home / Self Care | Payer: Medicare Other | Source: Ambulatory Visit | Attending: Physician Assistant | Admitting: Physician Assistant

## 2015-04-18 ENCOUNTER — Other Ambulatory Visit (INDEPENDENT_AMBULATORY_CARE_PROVIDER_SITE_OTHER): Payer: Medicare Other

## 2015-04-18 DIAGNOSIS — K7460 Unspecified cirrhosis of liver: Secondary | ICD-10-CM | POA: Insufficient documentation

## 2015-04-18 DIAGNOSIS — R188 Other ascites: Secondary | ICD-10-CM | POA: Diagnosis not present

## 2015-04-18 DIAGNOSIS — R140 Abdominal distension (gaseous): Secondary | ICD-10-CM | POA: Insufficient documentation

## 2015-04-18 DIAGNOSIS — IMO0001 Reserved for inherently not codable concepts without codable children: Secondary | ICD-10-CM

## 2015-04-18 DIAGNOSIS — R143 Flatulence: Secondary | ICD-10-CM | POA: Insufficient documentation

## 2015-04-18 DIAGNOSIS — K8020 Calculus of gallbladder without cholecystitis without obstruction: Secondary | ICD-10-CM | POA: Insufficient documentation

## 2015-04-18 LAB — IBC PANEL
Iron: 42 ug/dL (ref 42–165)
SATURATION RATIOS: 8.2 % — AB (ref 20.0–50.0)
TRANSFERRIN: 365 mg/dL — AB (ref 212.0–360.0)

## 2015-04-18 LAB — AMMONIA: AMMONIA: 81 umol/L — AB (ref 11–35)

## 2015-04-18 LAB — VITAMIN B12: VITAMIN B 12: 661 pg/mL (ref 211–911)

## 2015-04-18 LAB — FOLATE: FOLATE: 9.2 ng/mL (ref >5.9)

## 2015-04-19 ENCOUNTER — Telehealth: Payer: Self-pay | Admitting: Physician Assistant

## 2015-04-19 ENCOUNTER — Other Ambulatory Visit (INDEPENDENT_AMBULATORY_CARE_PROVIDER_SITE_OTHER): Payer: Medicare Other

## 2015-04-19 DIAGNOSIS — K7460 Unspecified cirrhosis of liver: Secondary | ICD-10-CM | POA: Diagnosis not present

## 2015-04-19 DIAGNOSIS — R188 Other ascites: Principal | ICD-10-CM

## 2015-04-19 LAB — HEMOCCULT SLIDES (X 3 CARDS)
FECAL OCCULT BLD: NEGATIVE
OCCULT 1: NEGATIVE
OCCULT 2: NEGATIVE
OCCULT 3: NEGATIVE
OCCULT 4: NEGATIVE
OCCULT 5: NEGATIVE

## 2015-04-20 NOTE — Telephone Encounter (Signed)
Left a message for patient to call back. 

## 2015-04-20 NOTE — Telephone Encounter (Signed)
This pts labs etc were not visible in Lori's box... He is a Teacher, adult education pt and needs to see Fuller Plan for follow up first available- to go over [place] etc. US shows he does have cirrhosis and has some ascites . Labs fine except ammonia level elevated- Fuller Plan can discuss further at office appt  As I do not know him and am answering  because Cecille Rubin Hvozdovic no longer employed here.

## 2015-04-20 NOTE — Telephone Encounter (Signed)
Spoke with patient and gave him results and recommendations. 

## 2015-04-23 ENCOUNTER — Telehealth: Payer: Self-pay | Admitting: Gastroenterology

## 2015-04-23 ENCOUNTER — Telehealth: Payer: Self-pay | Admitting: Physician Assistant

## 2015-04-23 NOTE — Telephone Encounter (Signed)
Left message for patient to call back  

## 2015-04-23 NOTE — Telephone Encounter (Signed)
Would you please find out his concern/question

## 2015-04-24 MED ORDER — METRONIDAZOLE 500 MG PO TABS: 500.0000 mg | ORAL_TABLET | Freq: Two times a day (BID) | ORAL | Status: DC

## 2015-04-24 NOTE — Telephone Encounter (Signed)
Patient requesting alternative Rx to Xifaxan. Its too expensive.

## 2015-04-24 NOTE — Telephone Encounter (Signed)
Patient left a message that he has questions about his results and tests being done.

## 2015-04-24 NOTE — Telephone Encounter (Signed)
Left message for patient to call back  

## 2015-04-24 NOTE — Telephone Encounter (Signed)
Informed patient of medication changes. Patient verbalized understanding. Rx e prescribed to pharmacy.

## 2015-04-24 NOTE — Telephone Encounter (Signed)
Flagyl 500 mg po bid for 14 days

## 2015-04-24 NOTE — Telephone Encounter (Signed)
All questions answered about results.  Patient needed to reschedule his office visit to 06/11/15

## 2015-04-25 ENCOUNTER — Ambulatory Visit: Payer: Medicare Other | Admitting: Gastroenterology

## 2015-05-28 ENCOUNTER — Ambulatory Visit: Payer: Medicare Other | Admitting: Gastroenterology

## 2015-06-11 ENCOUNTER — Ambulatory Visit (INDEPENDENT_AMBULATORY_CARE_PROVIDER_SITE_OTHER): Payer: Medicare Other | Admitting: Gastroenterology

## 2015-06-11 ENCOUNTER — Encounter: Payer: Self-pay | Admitting: Gastroenterology

## 2015-06-11 ENCOUNTER — Other Ambulatory Visit (INDEPENDENT_AMBULATORY_CARE_PROVIDER_SITE_OTHER): Payer: Medicare Other

## 2015-06-11 VITALS — BP 188/90 | HR 100 | Ht 65.0 in | Wt 189.2 lb

## 2015-06-11 DIAGNOSIS — R140 Abdominal distension (gaseous): Secondary | ICD-10-CM

## 2015-06-11 DIAGNOSIS — R188 Other ascites: Secondary | ICD-10-CM

## 2015-06-11 DIAGNOSIS — K7460 Unspecified cirrhosis of liver: Secondary | ICD-10-CM

## 2015-06-11 LAB — CBC WITH DIFFERENTIAL/PLATELET
Basophils Absolute: 0 10*3/uL (ref 0.0–0.1)
Basophils Relative: 0.5 % (ref 0.0–3.0)
EOS PCT: 2.1 % (ref 0.0–5.0)
Eosinophils Absolute: 0.1 10*3/uL (ref 0.0–0.7)
HCT: 26.7 % — ABNORMAL LOW (ref 39.0–52.0)
Hemoglobin: 8.9 g/dL — ABNORMAL LOW (ref 13.0–17.0)
LYMPHS ABS: 1.1 10*3/uL (ref 0.7–4.0)
Lymphocytes Relative: 21 % (ref 12.0–46.0)
MCHC: 33.4 g/dL (ref 30.0–36.0)
MCV: 87.6 fl (ref 78.0–100.0)
MONO ABS: 0.8 10*3/uL (ref 0.1–1.0)
Monocytes Relative: 14.8 % — ABNORMAL HIGH (ref 3.0–12.0)
NEUTROS PCT: 61.6 % (ref 43.0–77.0)
Neutro Abs: 3.3 10*3/uL (ref 1.4–7.7)
Platelets: 195 10*3/uL (ref 150.0–400.0)
RBC: 3.05 Mil/uL — AB (ref 4.22–5.81)
RDW: 20.7 % — AB (ref 11.5–15.5)
WBC: 5.3 10*3/uL (ref 4.0–10.5)

## 2015-06-11 LAB — COMPREHENSIVE METABOLIC PANEL
ALK PHOS: 125 U/L — AB (ref 39–117)
ALT: 13 U/L (ref 0–53)
AST: 42 U/L — ABNORMAL HIGH (ref 0–37)
Albumin: 3.9 g/dL (ref 3.5–5.2)
BUN: 13 mg/dL (ref 6–23)
CHLORIDE: 103 meq/L (ref 96–112)
CO2: 30 meq/L (ref 19–32)
Calcium: 9.2 mg/dL (ref 8.4–10.5)
Creatinine, Ser: 0.59 mg/dL (ref 0.40–1.50)
GFR: 146.15 mL/min (ref >60.00)
GLUCOSE: 101 mg/dL — AB (ref 70–99)
POTASSIUM: 3.8 meq/L (ref 3.5–5.1)
SODIUM: 138 meq/L (ref 135–145)
TOTAL PROTEIN: 7.7 g/dL (ref 6.0–8.3)
Total Bilirubin: 1.3 mg/dL — ABNORMAL HIGH (ref 0.2–1.2)

## 2015-06-11 LAB — PROTIME-INR
INR: 1.5 ratio — ABNORMAL HIGH (ref 0.8–1.0)
Prothrombin Time: 16.3 s — ABNORMAL HIGH (ref 9.6–13.1)

## 2015-06-11 LAB — APTT: APTT: 37.9 s — AB (ref 23.4–32.7)

## 2015-06-11 NOTE — Patient Instructions (Signed)
Your physician has requested that you go to the basement for lab work before leaving today.  You have been scheduled for an abdominal ultrasound at [facility] (1st floor of hospital) on 06/14/15 at 9:00am. Please arrive 15 minutes prior to your appointment for registration. Make certain not to have anything to eat or drink 6 hours prior to your appointment. Should you need to reschedule your appointment, please contact radiology at [phone]. This test typically takes about 30 minutes to perform.  Thank you for choosing me and [facility].  Pricilla Riffle. Dagoberto Ligas., MD., Marval Regal

## 2015-06-11 NOTE — Progress Notes (Signed)
    History of Present Illness: This is a 65 year old male returning for follow-up of cirrhosis. His only complaint is that of abdominal distention. A 9 pound weight loss is noted since his office visit 2 months ago.   Abd [place] 04/18/2015 IMPRESSION: 1. Gallbladder sludge and tiny gallstones. Gallbladder wall thickening to 5 mm. Gallbladder wall thickening could be secondary to cholecystitis and/or hyperproteinemia. 2. Hepatic findings again noted consistent with cirrhosis. No focal hepatic lesion identified. 3. Mild to moderate ascites.  Current Medications, Allergies, Past Medical History, Past Surgical History, Family History and Social History were reviewed in [employer] record.  Physical Exam: General: Well developed, well nourished, no acute distress Head: Normocephalic and atraumatic Eyes:  sclerae anicteric, EOMI Ears: Normal auditory acuity Mouth: No deformity or lesions Lungs: Clear throughout to auscultation Heart: Regular rate and rhythm; no murmurs, rubs or bruits Abdomen: Soft, non tender. Mild to moderate distention. Diastases rectus. No masses, hepatosplenomegaly or hernias noted. Normal Bowel sounds Musculoskeletal: Symmetrical with no gross deformities  Pulses:  Normal pulses noted Extremities: No clubbing, cyanosis, edema or deformities noted Neurological: Alert oriented x 4, grossly nonfocal Psychological:  Alert and cooperative. Normal mood and affect  Assessment and Recommendations:  1. Cirrhosis with abdominal distention. Mild to moderate ascites noted on prior ultrasound however he has lost 9 pounds since his last office visit. No pedal edema detected. Repeat abdominal ultrasound to assess for ascites. If he has significant ascites present will plan to discontinue hydrochlorothiazide and begin Lasix and spironolactone. CMP, CBC, PT/INR today. Avoid all etoh. Hepatits A and B vaccines. REV in 3 months.

## 2015-06-13 ENCOUNTER — Other Ambulatory Visit: Payer: Self-pay

## 2015-06-13 DIAGNOSIS — Z23 Encounter for immunization: Secondary | ICD-10-CM

## 2015-06-13 DIAGNOSIS — D509 Iron deficiency anemia, unspecified: Secondary | ICD-10-CM

## 2015-06-13 MED ORDER — PANTOPRAZOLE SODIUM 40 MG PO TBEC: 40.0000 mg | DELAYED_RELEASE_TABLET | Freq: Every day | ORAL | Status: DC

## 2015-06-13 MED ORDER — FUSION PLUS PO CAPS: 1.0000 | ORAL_CAPSULE | Freq: Two times a day (BID) | ORAL | Status: DC

## 2015-06-14 ENCOUNTER — Ambulatory Visit (HOSPITAL_COMMUNITY)
Admission: RE | Admit: 2015-06-14 | Discharge: 2015-06-14 | Disposition: A | Discharge status: Home / Self Care | Payer: Medicare Other | Source: Ambulatory Visit | Attending: Gastroenterology | Admitting: Gastroenterology

## 2015-06-14 ENCOUNTER — Ambulatory Visit (INDEPENDENT_AMBULATORY_CARE_PROVIDER_SITE_OTHER): Payer: Medicare Other | Admitting: Gastroenterology

## 2015-06-14 DIAGNOSIS — R140 Abdominal distension (gaseous): Secondary | ICD-10-CM

## 2015-06-14 DIAGNOSIS — Z23 Encounter for immunization: Secondary | ICD-10-CM

## 2015-06-14 DIAGNOSIS — R188 Other ascites: Secondary | ICD-10-CM | POA: Diagnosis not present

## 2015-06-14 DIAGNOSIS — K7460 Unspecified cirrhosis of liver: Secondary | ICD-10-CM

## 2015-06-14 DIAGNOSIS — R161 Splenomegaly, not elsewhere classified: Secondary | ICD-10-CM | POA: Insufficient documentation

## 2015-06-14 DIAGNOSIS — K8020 Calculus of gallbladder without cholecystitis without obstruction: Secondary | ICD-10-CM | POA: Diagnosis not present

## 2015-06-14 NOTE — Progress Notes (Signed)
Patient tolerated well.

## 2015-06-15 ENCOUNTER — Telehealth: Payer: Self-pay | Admitting: Gastroenterology

## 2015-06-15 DIAGNOSIS — R188 Other ascites: Secondary | ICD-10-CM

## 2015-06-18 MED ORDER — SPIRONOLACTONE 50 MG PO TABS: 100.0000 mg | ORAL_TABLET | Freq: Every day | ORAL | Status: DC

## 2015-06-18 MED ORDER — FUROSEMIDE 10 MG/ML PO SOLN: 20.0000 mg | Freq: Every day | ORAL | Status: DC

## 2015-06-18 NOTE — Telephone Encounter (Signed)
Patient notified he will need to finish his series here.

## 2015-06-18 NOTE — Telephone Encounter (Signed)
See results note on [place] for additional details. I spoke with Dr. Deborra Medina clinic they do not administer Twinrix at their clinic. Left message for patient to call back

## 2015-06-19 ENCOUNTER — Ambulatory Visit (INDEPENDENT_AMBULATORY_CARE_PROVIDER_SITE_OTHER): Payer: Medicare Other | Admitting: Family Medicine

## 2015-06-19 ENCOUNTER — Encounter: Payer: Self-pay | Admitting: Family Medicine

## 2015-06-19 VITALS — BP 134/72 | HR 82 | Temp 98.1°F | Wt 188.2 lb

## 2015-06-19 DIAGNOSIS — I10 Essential (primary) hypertension: Secondary | ICD-10-CM

## 2015-06-19 DIAGNOSIS — K7460 Unspecified cirrhosis of liver: Secondary | ICD-10-CM | POA: Insufficient documentation

## 2015-06-19 DIAGNOSIS — K2100 Gastro-esophageal reflux disease with esophagitis, without bleeding: Secondary | ICD-10-CM

## 2015-06-19 DIAGNOSIS — F329 Major depressive disorder, single episode, unspecified: Secondary | ICD-10-CM

## 2015-06-19 DIAGNOSIS — K7469 Other cirrhosis of liver: Secondary | ICD-10-CM

## 2015-06-19 DIAGNOSIS — E785 Hyperlipidemia, unspecified: Secondary | ICD-10-CM | POA: Diagnosis not present

## 2015-06-19 DIAGNOSIS — K7290 Hepatic failure, unspecified without coma: Secondary | ICD-10-CM | POA: Insufficient documentation

## 2015-06-19 DIAGNOSIS — F32A Depression, unspecified: Secondary | ICD-10-CM

## 2015-06-19 LAB — LIPID PANEL
CHOL/HDL RATIO: 5
CHOLESTEROL: 106 mg/dL (ref 0–200)
HDL: 22.6 mg/dL — AB (ref >39.00)
LDL Cholesterol: 69 mg/dL (ref 0–99)
NonHDL: 83.47
TRIGLYCERIDES: 71 mg/dL (ref 0.0–149.0)
VLDL: 14.2 mg/dL (ref 0.0–40.0)

## 2015-06-19 NOTE — Assessment & Plan Note (Signed)
Followed by GI. He has already lost some weight and feeling better with diuretic.

## 2015-06-19 NOTE — Progress Notes (Signed)
Pre visit review using our clinic review tool, if applicable. No additional management support is needed unless otherwise documented below in the visit note. 

## 2015-06-19 NOTE — Patient Instructions (Signed)
Good to see you. We will call you with your results from today. 

## 2015-06-19 NOTE — Progress Notes (Signed)
Subjective:   Patient ID: [name], male    DOB: September 20, 1950, 65 y.o.   MRN: [id number]  Verdis A Gignac is a pleasant 65 y.o. year old male who presents to clinic today with Follow-up and Medication Refill  on 06/19/2015  HPI:  Ascites/liver cirrhoisis- followed by Dr. Fuller Plan.  Was last seen last week - 06/11/15. Note reviewed. Abdominal US repeated on 5/18- cirrhosis with ascites. HCTZ was d/c'd and started on lasix 20 mg daily and spironolactone 50 mg daily. Scheduled for repeat BMET and advised to return to see him in 4-6 weeks.  He is also taking Protonix for GERD.  Wt Readings from Last 3 Encounters:  06/19/15 188 lb 4 oz (85.39 kg)  06/11/15 189 lb 3.2 oz (85.821 kg)  04/11/15 198 lb 4 oz (89.926 kg)    Lab Results  Component Value Date   NA 138 06/11/2015   K 3.8 06/11/2015   CL 103 06/11/2015   CO2 30 06/11/2015    HTN- also taking Lisinopril 40 mg daily along with lasix and aldactone.  HLD- taking tricor 145 mg daily. Due for labs.  Lab Results  Component Value Date   CHOL 193 12/20/2013   HDL 35* 12/20/2013   LDLCALC 133* 12/20/2013   TRIG 126 12/20/2013   CHOLHDL 5.5* 12/20/2013   He does feel his symptoms of depression have improved with Cymbalta.  Lab Results  Component Value Date   CREATININE 0.59 06/11/2015    Current Outpatient Prescriptions on File Prior to Visit  Medication Sig Dispense Refill  . diclofenac (VOLTAREN) 75 MG EC tablet Take 1 tablet by mouth  twice a day 180 tablet 1  . DULoxetine (CYMBALTA) 30 MG capsule TAKE 1 CAPSULE (30 MG TOTAL) BY MOUTH DAILY. 90 capsule 1  . EPINEPHrine (EPI-PEN) 0.3 mg/0.3 mL SOAJ injection Inject 0.3 mLs (0.3 mg total) into the muscle once. 1 Device 1  . fenofibrate (TRICOR) 145 MG tablet TAKE 1 TABLET BY MOUTH DAILY 90 tablet 0  . furosemide (LASIX) 10 MG/ML solution Take 2 mLs (20 mg total) by mouth daily. 30 mL 6  . glucosamine-chondroitin 500-400 MG tablet Take 1 tablet by mouth 2 (two) times  daily.      . Iron-FA-B Cmp-C-Biot-Probiotic (FUSION PLUS) CAPS Take 1 capsule by mouth 2 (two) times daily with a meal. 60 capsule 2  . lisinopril (PRINIVIL,ZESTRIL) 40 MG tablet Take 1 tablet by mouth  daily 30 tablet 3  . meclizine (ANTIVERT) 25 MG tablet Take 1 tablet (25 mg total) by mouth daily as needed for dizziness or nausea. 90 tablet 0  . meloxicam (MOBIC) 7.5 MG tablet Take 1 tablet by mouth  twice a day 180 tablet 2  . Multiple Vitamin (MULTIVITAMIN) tablet Take 1 tablet by mouth daily. Mens MVI-Take one daily    . pantoprazole (PROTONIX) 40 MG tablet Take 1 tablet (40 mg total) by mouth daily. 30 tablet 11  . Potassium 99 MG TABS Take 1 tablet by mouth 2 (two) times daily.      Marland Kitchen spironolactone (ALDACTONE) 50 MG tablet Take 2 tablets (100 mg total) by mouth daily. 30 tablet 6  . Tetrahydrozoline HCl (VISINE OP) Apply 1 drop to eye daily as needed (dry eyes).     No current facility-administered medications on file prior to visit.    Allergies  Allergen Reactions  . Lactulose     "stomach issues"  . Sulfonamide Derivatives     REACTION: unconsciousness/dizziness  Past Medical History  Diagnosis Date  . 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 Hyperlipidemia 06/2002  . Hypertension   . Arthritis     In back and all joints  . Depression   . Chronic headache   . GI bleed   . Kidney stones   . GERD (gastroesophageal reflux disease)   . Hiatal hernia   . Esophageal varices (HCC)     Past Surgical History  Procedure Laterality Date  . Carpal tunnel release  2005     / right hand  . Tonsillectomy and adenoidectomy      65 years old  . Vasectomy      Family History  Problem Relation Age of Onset  . Cancer Father     history of melanona  . Cancer Paternal Aunt     breast  . Alcohol abuse Maternal Grandfather     Social History   Social History  . Marital Status: Married    Spouse Name: N/A  . Number of Children: 2  . Years of  Education: N/A   Occupational History  . [employer]-Maintence     currently unemployed  .     Social History Main Topics  . Smoking status: Former Smoker -- 35 years    Types: Cigars    Quit date: 04/02/2010  . Smokeless tobacco: Never Used     Comment: smoked cigars 1 to 2 daily  . Alcohol Use: 1.2 oz/week    2 Cans of beer per week     Comment: 2 beers a day  . Drug Use: No  . Sexual Activity:    Partners: Female     Comment: declined condoms   Other Topics Concern  . Not on file   Social History Narrative   The PMH, PSH, Social History, Family History, Medications, and allergies have been reviewed in [facility], and have been updated if relevant.   Review of Systems  Constitutional: Negative.   Eyes: Negative.   Respiratory: Negative.   Cardiovascular: Negative.   Gastrointestinal: Negative for nausea, vomiting, abdominal pain, diarrhea, constipation, blood in stool, abdominal distention, anal bleeding and rectal pain.  Endocrine: Negative.   Genitourinary: Negative.   Musculoskeletal: Negative for myalgias.  Skin: Negative.   Neurological: Negative.   Hematological: Negative.   Psychiatric/Behavioral: Negative.   All other systems reviewed and are negative.      Objective:    BP 134/72 mmHg  Pulse 82  Temp(Src) 98.1 F (36.7 C) (Oral)  Wt 188 lb 4 oz (85.39 kg)  SpO2 97%   Physical Exam  Constitutional: He is oriented to person, place, and time. He appears well-developed and well-nourished. No distress.  HENT:  Head: Normocephalic.  Eyes: Conjunctivae are normal.  Cardiovascular: Normal rate.   Pulmonary/Chest: Effort normal.  Abdominal: He exhibits distension. He exhibits no mass. There is no tenderness. There is no rebound and no guarding.  Musculoskeletal: Normal range of motion.  Neurological: He is alert and oriented to person, place, and time. No cranial nerve deficit.  Skin: Skin is warm and dry. He is not diaphoretic.  Psychiatric:  He has a normal mood and affect. His behavior is normal. Judgment and thought content normal.  Nursing note and vitals reviewed.         Assessment & Plan:   HLD (hyperlipidemia)  Gastroesophageal reflux disease with esophagitis  Essential hypertension, benign  Depression  Other cirrhosis of liver (HCC) No Follow-up on file.

## 2015-06-19 NOTE — Assessment & Plan Note (Signed)
Continue tricor. Check lipid panel today. 

## 2015-06-19 NOTE — Assessment & Plan Note (Signed)
Well controlled on current rxs. No changes made today. 

## 2015-06-19 NOTE — Assessment & Plan Note (Signed)
Symptoms controlled on current rx. No changes made today. 

## 2015-06-20 ENCOUNTER — Encounter: Payer: Self-pay | Admitting: *Deleted

## 2015-06-21 ENCOUNTER — Other Ambulatory Visit: Payer: Self-pay | Admitting: Family Medicine

## 2015-06-21 ENCOUNTER — Ambulatory Visit (INDEPENDENT_AMBULATORY_CARE_PROVIDER_SITE_OTHER): Payer: Medicare Other | Admitting: Gastroenterology

## 2015-06-21 DIAGNOSIS — Z23 Encounter for immunization: Secondary | ICD-10-CM | POA: Diagnosis not present

## 2015-07-05 ENCOUNTER — Ambulatory Visit (INDEPENDENT_AMBULATORY_CARE_PROVIDER_SITE_OTHER): Payer: Medicare Other | Admitting: Gastroenterology

## 2015-07-05 DIAGNOSIS — Z23 Encounter for immunization: Secondary | ICD-10-CM

## 2015-07-05 NOTE — Progress Notes (Signed)
Patient tolerated well.

## 2015-07-06 ENCOUNTER — Telehealth: Payer: Self-pay | Admitting: Gastroenterology

## 2015-07-09 NOTE — Telephone Encounter (Signed)
Patient is due for labs in July.  He would like to have them done at [facility] in [place].  He is aware I will mail him an order in July.

## 2015-07-09 NOTE — Telephone Encounter (Signed)
Left message for patient to call back  

## 2015-07-17 ENCOUNTER — Other Ambulatory Visit: Payer: Self-pay | Admitting: Gastroenterology

## 2015-07-19 ENCOUNTER — Telehealth: Payer: Self-pay | Admitting: Gastroenterology

## 2015-07-19 NOTE — Telephone Encounter (Signed)
Rescheduled patient's appt for August. Patient was scheduled by mistake too soon for follow up. Patient scheduled for 09/26/15 at 3:45pm. Patient verbalized understanding.

## 2015-07-20 ENCOUNTER — Other Ambulatory Visit: Payer: Self-pay | Admitting: Otolaryngology

## 2015-07-20 DIAGNOSIS — I639 Cerebral infarction, unspecified: Secondary | ICD-10-CM

## 2015-07-20 DIAGNOSIS — R42 Dizziness and giddiness: Secondary | ICD-10-CM | POA: Diagnosis not present

## 2015-07-20 DIAGNOSIS — H903 Sensorineural hearing loss, bilateral: Secondary | ICD-10-CM | POA: Diagnosis not present

## 2015-07-23 ENCOUNTER — Inpatient Hospital Stay
Admission: EM | Admit: 2015-07-23 | Discharge: 2015-07-30 | DRG: 432 | Disposition: A | Discharge status: Home / Self Care | Payer: Medicare Other | Attending: Internal Medicine | Admitting: Internal Medicine

## 2015-07-23 ENCOUNTER — Other Ambulatory Visit: Payer: Self-pay

## 2015-07-23 DIAGNOSIS — I10 Essential (primary) hypertension: Secondary | ICD-10-CM | POA: Diagnosis present

## 2015-07-23 DIAGNOSIS — I8511 Secondary esophageal varices with bleeding: Secondary | ICD-10-CM | POA: Diagnosis not present

## 2015-07-23 DIAGNOSIS — Z87442 Personal history of urinary calculi: Secondary | ICD-10-CM | POA: Diagnosis not present

## 2015-07-23 DIAGNOSIS — F1010 Alcohol abuse, uncomplicated: Secondary | ICD-10-CM | POA: Diagnosis not present

## 2015-07-23 DIAGNOSIS — R34 Anuria and oliguria: Secondary | ICD-10-CM | POA: Diagnosis present

## 2015-07-23 DIAGNOSIS — K219 Gastro-esophageal reflux disease without esophagitis: Secondary | ICD-10-CM | POA: Diagnosis present

## 2015-07-23 DIAGNOSIS — E7220 Disorder of urea cycle metabolism, unspecified: Secondary | ICD-10-CM | POA: Diagnosis not present

## 2015-07-23 DIAGNOSIS — Z791 Long term (current) use of non-steroidal anti-inflammatories (NSAID): Secondary | ICD-10-CM | POA: Diagnosis not present

## 2015-07-23 DIAGNOSIS — E875 Hyperkalemia: Secondary | ICD-10-CM | POA: Diagnosis present

## 2015-07-23 DIAGNOSIS — K766 Portal hypertension: Secondary | ICD-10-CM | POA: Diagnosis present

## 2015-07-23 DIAGNOSIS — K7290 Hepatic failure, unspecified without coma: Secondary | ICD-10-CM | POA: Diagnosis present

## 2015-07-23 DIAGNOSIS — K920 Hematemesis: Secondary | ICD-10-CM | POA: Diagnosis not present

## 2015-07-23 DIAGNOSIS — J9690 Respiratory failure, unspecified, unspecified whether with hypoxia or hypercapnia: Secondary | ICD-10-CM | POA: Diagnosis not present

## 2015-07-23 DIAGNOSIS — K567 Ileus, unspecified: Secondary | ICD-10-CM | POA: Diagnosis not present

## 2015-07-23 DIAGNOSIS — Z87891 Personal history of nicotine dependence: Secondary | ICD-10-CM | POA: Diagnosis not present

## 2015-07-23 DIAGNOSIS — R4182 Altered mental status, unspecified: Secondary | ICD-10-CM | POA: Diagnosis not present

## 2015-07-23 DIAGNOSIS — E785 Hyperlipidemia, unspecified: Secondary | ICD-10-CM | POA: Diagnosis present

## 2015-07-23 DIAGNOSIS — F10231 Alcohol dependence with withdrawal delirium: Secondary | ICD-10-CM | POA: Diagnosis not present

## 2015-07-23 DIAGNOSIS — E876 Hypokalemia: Secondary | ICD-10-CM | POA: Diagnosis not present

## 2015-07-23 DIAGNOSIS — Z4659 Encounter for fitting and adjustment of other gastrointestinal appliance and device: Secondary | ICD-10-CM

## 2015-07-23 DIAGNOSIS — M1990 Unspecified osteoarthritis, unspecified site: Secondary | ICD-10-CM | POA: Diagnosis present

## 2015-07-23 DIAGNOSIS — Z79899 Other long term (current) drug therapy: Secondary | ICD-10-CM | POA: Diagnosis not present

## 2015-07-23 DIAGNOSIS — Z4682 Encounter for fitting and adjustment of non-vascular catheter: Secondary | ICD-10-CM | POA: Diagnosis not present

## 2015-07-23 DIAGNOSIS — D649 Anemia, unspecified: Secondary | ICD-10-CM | POA: Diagnosis not present

## 2015-07-23 DIAGNOSIS — Z86010 Personal history of colonic polyps: Secondary | ICD-10-CM

## 2015-07-23 DIAGNOSIS — D62 Acute posthemorrhagic anemia: Secondary | ICD-10-CM | POA: Diagnosis present

## 2015-07-23 DIAGNOSIS — F329 Major depressive disorder, single episode, unspecified: Secondary | ICD-10-CM | POA: Diagnosis present

## 2015-07-23 DIAGNOSIS — Z882 Allergy status to sulfonamides status: Secondary | ICD-10-CM | POA: Diagnosis not present

## 2015-07-23 DIAGNOSIS — I8501 Esophageal varices with bleeding: Secondary | ICD-10-CM | POA: Diagnosis not present

## 2015-07-23 DIAGNOSIS — K7030 Alcoholic cirrhosis of liver without ascites: Secondary | ICD-10-CM | POA: Diagnosis not present

## 2015-07-23 DIAGNOSIS — K922 Gastrointestinal hemorrhage, unspecified: Secondary | ICD-10-CM | POA: Diagnosis not present

## 2015-07-23 DIAGNOSIS — I639 Cerebral infarction, unspecified: Secondary | ICD-10-CM

## 2015-07-23 DIAGNOSIS — R110 Nausea: Secondary | ICD-10-CM | POA: Diagnosis not present

## 2015-07-23 DIAGNOSIS — Z01818 Encounter for other preprocedural examination: Secondary | ICD-10-CM

## 2015-07-23 LAB — CBC WITH DIFFERENTIAL/PLATELET
BASOS ABS: 0.1 10*3/uL (ref 0–0.1)
Basophils Relative: 1 %
EOS PCT: 2 %
Eosinophils Absolute: 0.2 10*3/uL (ref 0–0.7)
HCT: 24.5 % — ABNORMAL LOW (ref 40.0–52.0)
Hemoglobin: 8.3 g/dL — ABNORMAL LOW (ref 13.0–18.0)
LYMPHS PCT: 17 %
Lymphs Abs: 1.5 10*3/uL (ref 1.0–3.6)
MCH: 31.1 pg (ref 26.0–34.0)
MCHC: 34 g/dL (ref 32.0–36.0)
MCV: 91.4 fL (ref 80.0–100.0)
MONO ABS: 1.1 10*3/uL — AB (ref 0.2–1.0)
MONOS PCT: 13 %
NEUTROS ABS: 5.8 10*3/uL (ref 1.4–6.5)
NEUTROS PCT: 67 %
Platelets: 176 10*3/uL (ref 150–440)
RBC: 2.68 MIL/uL — ABNORMAL LOW (ref 4.40–5.90)
RDW: 18.8 % — ABNORMAL HIGH (ref 11.5–14.5)
WBC: 8.6 10*3/uL (ref 3.8–10.6)

## 2015-07-23 LAB — COMPREHENSIVE METABOLIC PANEL
ALBUMIN: 3.5 g/dL (ref 3.5–5.0)
ALK PHOS: 69 U/L (ref 38–126)
ALT: 22 U/L (ref 17–63)
AST: 44 U/L — ABNORMAL HIGH (ref 15–41)
Anion gap: 7 (ref 5–15)
BILIRUBIN TOTAL: 0.9 mg/dL (ref 0.3–1.2)
BUN: 29 mg/dL — AB (ref 6–20)
CALCIUM: 9.4 mg/dL (ref 8.9–10.3)
CO2: 26 mmol/L (ref 22–32)
CREATININE: 0.89 mg/dL (ref 0.61–1.24)
Chloride: 106 mmol/L (ref 101–111)
GFR calc Af Amer: >60 mL/min (ref >60)
GFR calc non Af Amer: >60 mL/min (ref >60)
GLUCOSE: 162 mg/dL — AB (ref 65–99)
Potassium: 4.9 mmol/L (ref 3.5–5.1)
SODIUM: 139 mmol/L (ref 135–145)
Total Protein: 7.1 g/dL (ref 6.5–8.1)

## 2015-07-23 LAB — HEMOGLOBIN AND HEMATOCRIT, BLOOD
HCT: 20.5 % — ABNORMAL LOW (ref 40.0–52.0)
HEMOGLOBIN: 6.9 g/dL — AB (ref 13.0–18.0)

## 2015-07-23 LAB — PROTIME-INR
INR: 1.5
PROTHROMBIN TIME: 18.2 s — AB (ref 11.4–15.0)

## 2015-07-23 LAB — APTT: aPTT: 35 seconds (ref 24–36)

## 2015-07-23 LAB — ABO/RH: ABO/RH(D): A POS

## 2015-07-23 MED ORDER — PANTOPRAZOLE SODIUM 40 MG IV SOLR
40.0000 mg | Freq: Two times a day (BID) | INTRAVENOUS | Status: DC
  Administered 2015-07-27 – 2015-07-30 (×7): 40 mg via INTRAVENOUS
  Filled 2015-07-23 (×8): qty 40

## 2015-07-23 MED ORDER — ONDANSETRON HCL 4 MG/2ML IJ SOLN
4.0000 mg | Freq: Once | INTRAMUSCULAR | Status: AC
  Administered 2015-07-23: 4 mg via INTRAVENOUS

## 2015-07-23 MED ORDER — SODIUM CHLORIDE 0.9 % IV SOLN
Freq: Once | INTRAVENOUS | Status: AC
  Administered 2015-07-23: via INTRAVENOUS

## 2015-07-23 MED ORDER — OCTREOTIDE ACETATE 100 MCG/ML IJ SOLN
50.0000 ug | Freq: Once | INTRAMUSCULAR | Status: AC
  Administered 2015-07-23: 50 ug via INTRAVENOUS
  Filled 2015-07-23: qty 1

## 2015-07-23 MED ORDER — ONDANSETRON HCL 4 MG/2ML IJ SOLN
INTRAMUSCULAR | Status: AC
  Filled 2015-07-23: qty 2

## 2015-07-23 MED ORDER — SODIUM CHLORIDE 0.9 % IV SOLN
80.0000 mg | Freq: Once | INTRAVENOUS | Status: AC
  Administered 2015-07-24: 80 mg via INTRAVENOUS
  Filled 2015-07-23: qty 80

## 2015-07-23 MED ORDER — SODIUM CHLORIDE 0.9 % IV SOLN
50.0000 ug/h | INTRAVENOUS | Status: DC
  Administered 2015-07-24 – 2015-07-25 (×4): 50 ug/h via INTRAVENOUS
  Filled 2015-07-23 (×5): qty 1

## 2015-07-23 MED ORDER — CIPROFLOXACIN IN D5W 400 MG/200ML IV SOLN
400.0000 mg | Freq: Once | INTRAVENOUS | Status: AC
  Administered 2015-07-23: 400 mg via INTRAVENOUS
  Filled 2015-07-23: qty 200

## 2015-07-23 MED ORDER — PANTOPRAZOLE SODIUM 40 MG IV SOLR
40.0000 mg | Freq: Two times a day (BID) | INTRAVENOUS | Status: DC
  Administered 2015-07-23: 40 mg via INTRAVENOUS
  Filled 2015-07-23: qty 40

## 2015-07-23 MED ORDER — PANTOPRAZOLE SODIUM 40 MG IV SOLR: 40.0000 mg | Freq: Two times a day (BID) | INTRAVENOUS | Status: DC

## 2015-07-23 MED ORDER — SODIUM CHLORIDE 0.9 % IV SOLN
8.0000 mg/h | INTRAVENOUS | Status: DC
  Administered 2015-07-24 – 2015-07-25 (×4): 8 mg/h via INTRAVENOUS
  Filled 2015-07-23 (×4): qty 80

## 2015-07-23 MED ORDER — PROMETHAZINE HCL 25 MG/ML IJ SOLN
12.5000 mg | Freq: Once | INTRAMUSCULAR | Status: AC
  Administered 2015-07-23: 12.5 mg via INTRAVENOUS
  Filled 2015-07-23: qty 1

## 2015-07-23 MED ORDER — ONDANSETRON HCL 4 MG/2ML IJ SOLN
INTRAMUSCULAR | Status: AC
  Administered 2015-07-23: 4 mg via INTRAVENOUS
  Filled 2015-07-23: qty 2

## 2015-07-23 NOTE — ED Notes (Signed)
Pt to triage via w/c with no distress noted; pt reports BM with blood (diarrhea x 4 ) and vomiting with blood x 4 ; denies hx of same; denies any pain

## 2015-07-23 NOTE — H&P (Addendum)
PCP:   Arnette Norris, MD   Chief Complaint:  Vomiting blood  HPI: This is a 65 year old gentleman alcoholic. He drinks whiskey daily. He also has a known history of liver cirrhosis with varices. This evening started vomiting copious amounts of blood and having black tarry stools. He came to the ER. He denies abdominal pain, he denies any prior variceal bleeding. The hospitalist have been asked to admit.  Review of Systems:  The patient denies anorexia, fever, weight loss,, vision loss, decreased hearing, hoarseness, chest pain, syncope, dyspnea on exertion, peripheral edema, balance deficits, hemoptysis, hematemesis, abdominal pain, melena, hematochezia, severe indigestion/heartburn, hematuria, incontinence, genital sores, muscle weakness, suspicious skin lesions, transient blindness, difficulty walking, depression, unusual weight change, abnormal bleeding, enlarged lymph nodes, angioedema, and breast masses.  Past Medical History: Past Medical History  Diagnosis Date  . 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 Hyperlipidemia 06/2002  . Hypertension   . Arthritis     In back and all joints  . Depression   . Chronic headache   . GI bleed   . Kidney stones   . GERD (gastroesophageal reflux disease)   . Hiatal hernia   . Esophageal varices (HCC)    Past Surgical History  Procedure Laterality Date  . Carpal tunnel release  2005     / right hand  . Tonsillectomy and adenoidectomy      65 years old  . Vasectomy      Medications: Prior to Admission medications   Medication Sig Start Date End Date Taking? Authorizing Provider  diclofenac (VOLTAREN) 75 MG EC tablet Take 75 mg by mouth 2 (two) times daily as needed for mild pain.    Yes Historical Provider, MD  DULoxetine (CYMBALTA) 30 MG capsule Take 30 mg by mouth daily.   Yes Historical Provider, MD  EPINEPHrine (EPIPEN 2-PAK) 0.3 mg/0.3 mL IJ SOAJ injection Inject 0.3 mg into the muscle once as needed (for severe  allergic reaction).   Yes Historical Provider, MD  fenofibrate (TRICOR) 145 MG tablet Take 145 mg by mouth daily.   Yes Historical Provider, MD  furosemide (LASIX) 10 MG/ML solution Take 2 mLs (20 mg total) by mouth daily. 06/18/15  Yes Ladene Artist, MD  glucosamine-chondroitin 500-400 MG tablet Take 1 tablet by mouth 2 (two) times daily.     Yes Historical Provider, MD  Iron-FA-B Cmp-C-Biot-Probiotic (FUSION PLUS) CAPS Take 1 capsule by mouth 2 (two) times daily with a meal. 06/13/15  Yes Ladene Artist, MD  lisinopril (PRINIVIL,ZESTRIL) 40 MG tablet Take 40 mg by mouth daily.   Yes Historical Provider, MD  meclizine (ANTIVERT) 25 MG tablet Take 1 tablet (25 mg total) by mouth daily as needed for dizziness or nausea. 10/25/14 10/25/15 Yes Ladene Artist, MD  meloxicam (MOBIC) 7.5 MG tablet Take 7.5 mg by mouth 2 (two) times daily.   Yes Historical Provider, MD  Multiple Vitamin (MULTIVITAMIN WITH MINERALS) TABS tablet Take 1 tablet by mouth daily.   Yes Historical Provider, MD  pantoprazole (PROTONIX) 40 MG tablet Take 1 tablet (40 mg total) by mouth daily. 06/13/15  Yes Ladene Artist, MD  Potassium 99 MG TABS Take 99 mg by mouth 2 (two) times daily.    Yes Historical Provider, MD  spironolactone (ALDACTONE) 50 MG tablet Take 2 tablets (100 mg total) by mouth daily. 06/18/15  Yes Ladene Artist, MD  tetrahydrozoline 0.05 % ophthalmic solution Place 1 drop into both eyes daily.  Yes Historical Provider, MD    Allergies:   Allergies  Allergen Reactions  . Bee Venom Anaphylaxis  . Sulfa Antibiotics Other (See Comments)    Reaction:  Fainting     Social History:  reports that he quit smoking about 5 years ago. His smoking use included Cigars. He has never used smokeless tobacco. He reports that he drinks about 1.2 oz of alcohol per week. He reports that he does not use illicit drugs.  Family History: Family History  Problem Relation Age of Onset  . Cancer Father     history of melanona   . Cancer Paternal Aunt     breast  . Alcohol abuse Maternal Grandfather     Physical Exam: Filed Vitals:   07/23/15 2100 07/23/15 2145 07/23/15 2200 07/23/15 2243  BP: 159/68  131/58 124/64  Pulse:  71 71   Temp:      TempSrc:      Resp: 17 18 17    Height:      Weight:      SpO2:  98% 99%     General:  Alert and oriented times three, well developed and nourished, no acute distress Eyes: PERRLA, pink conjunctiva, no scleral icterus ENT: Moist oral mucosa, neck supple, no thyromegaly Lungs: clear to ascultation, no wheeze, no crackles, no use of accessory muscles Cardiovascular: regular rate and rhythm, no regurgitation, no gallops, no murmurs. No carotid bruits, no JVD Abdomen: soft, positive BS, nonspecific tenderness to palpation, non-distended, no organomegaly, not an acute abdomen GU: not examined Neuro: CN II - XII grossly intact, sensation intact Musculoskeletal: strength 5/5 all extremities, no clubbing, cyanosis or edema Skin: no rash, no subcutaneous crepitation, no decubitus Psych: appropriate patient   Labs on Admission:   Recent Labs  07/23/15 1959  NA 139  K 4.9  CL 106  CO2 26  GLUCOSE 162*  BUN 29*  CREATININE 0.89  CALCIUM 9.4    Recent Labs  07/23/15 1959  AST 44*  ALT 22  ALKPHOS 69  BILITOT 0.9  PROT 7.1  ALBUMIN 3.5   No results for input(s): LIPASE, AMYLASE in the last 72 hours.  Recent Labs  07/23/15 1959  WBC 8.6  NEUTROABS 5.8  HGB 8.3*  HCT 24.5*  MCV 91.4  PLT 176   No results for input(s): CKTOTAL, CKMB, CKMBINDEX, TROPONINI in the last 72 hours. Invalid input(s): POCBNP No results for input(s): DDIMER in the last 72 hours. No results for input(s): HGBA1C in the last 72 hours. No results for input(s): CHOL, HDL, LDLCALC, TRIG, CHOLHDL, LDLDIRECT in the last 72 hours. No results for input(s): TSH, T4TOTAL, T3FREE, THYROIDAB in the last 72 hours.  Invalid input(s): FREET3 No results for input(s): VITAMINB12, FOLATE,  FERRITIN, TIBC, IRON, RETICCTPCT in the last 72 hours.  Micro Results: No results found for this or any previous visit (from the past 240 hour(s)).   Radiological Exams on Admission: No results found.  Assessment/Plan Present on Admission:  . UGIB (upper gastrointestinal bleed) . Alcoholic cirrhosis ([place]) -Admit to ICU -Protonix drip, octreotide tight drip ordered -Serial H&H every 4 hours, transfuse if needed -Nothing by mouth. INR normal -Spoke with GI Dr.Wall, he recommends recommends starting octreotide, transfusing and stabilizing patient.  -Stat H&H, stat transfusion of 2 packed red blood cells oredered. Normal saline bolus 1000 mL ordered as patient is currently symptomatic and dizzy.   Alcohol abuse -CIWA protocol  . HLD (hyperlipidemia) -Stable, home medications held  . Essential hypertension, benign -Currently  stable, home medications held    [place], Mechele Kittleson 07/23/2015, 11:12 PM

## 2015-07-23 NOTE — ED Notes (Signed)
Pt sts that he normally drinks 2 "double shot" bourbons every night. Sts that he has not had drinks tonight

## 2015-07-23 NOTE — ED Provider Notes (Signed)
[facility] Emergency Department Provider Note   ____________________________________________  Time seen: ~1955  I have reviewed the triage vital signs and the nursing notes.   HISTORY  Chief Complaint Hematemesis and Rectal Bleeding   History limited by: Not Limited   HPI Jeff Ward is a 65 y.o. male who presents to the emergency department today because of concerns for bloody vomiting and bloody diarrhea. The patient says that the symptoms started roughly 5 hours ago. He has had roughly half a dozen episodes of both vomiting and diarrhea. He says that except for the initial time they have been bloody. He describes it as a darker red. He denies any abdominal pain with this. Denies similar symptoms in the past. Denies any dizziness or lightheadedness. Denies any chest pain. Denies any history of cirrhosis or heavy drinking. Says he has had a colonoscopy and an endoscopy in the past and was told he had some polyps removed however says the rest he thought was okay. Denies any recent fevers.   Past Medical History  Diagnosis Date  . 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 Hyperlipidemia 06/2002  . Hypertension   . Arthritis     In back and all joints  . Depression   . Chronic headache   . GI bleed   . Kidney stones   . GERD (gastroesophageal reflux disease)   . Hiatal hernia   . Esophageal varices [facility])     Patient Active Problem List   Diagnosis Date Noted  . Liver cirrhosis ([place]) 06/19/2015  . GERD (gastroesophageal reflux disease) 03/22/2015  . Abdominal pain 03/22/2015  . Personal history of colonic polyps 04/27/2013  . Depression 02/17/2013  . Hyperglycemia 07/23/2011  . OSA (obstructive sleep apnea) 04/10/2011  . Testosterone deficiency 03/06/2011  . Degenerative disc disease, lumbar 05/09/2010  . OTHER NONSPECIFIC ABNORMAL SERUM ENZYME LEVELS 05/23/2009  . Essential hypertension, benign 09/01/2007  . COLONIC POLYPS  10/23/2006  . HLD (hyperlipidemia) 10/23/2006  . PUD 10/23/2006  . 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 CARPAL TUNNEL SYNDROME, BILATERAL, HX OF 10/23/2006  . BENIGN PROSTATIC HYPERTROPHY, HX OF 10/23/2006    Past Surgical History  Procedure Laterality Date  . Carpal tunnel release  2005     / right hand  . Tonsillectomy and adenoidectomy      65 years old  . Vasectomy      Current Outpatient Rx  Name  Route  Sig  Dispense  Refill  . diclofenac (VOLTAREN) 75 MG EC tablet      Take 1 tablet by mouth  twice a day   180 tablet   1   . DULoxetine (CYMBALTA) 30 MG capsule      TAKE 1 CAPSULE (30 MG TOTAL) BY MOUTH DAILY.   90 capsule   1   . EPINEPHrine (EPI-PEN) 0.3 mg/0.3 mL SOAJ injection   Intramuscular   Inject 0.3 mLs (0.3 mg total) into the muscle once.   1 Device   1   . fenofibrate (TRICOR) 145 MG tablet      TAKE 1 TABLET BY MOUTH DAILY   90 tablet   1   . furosemide (LASIX) 10 MG/ML solution   Oral   Take 2 mLs (20 mg total) by mouth daily.   30 mL   6   . glucosamine-chondroitin 500-400 MG tablet   Oral   Take 1 tablet by mouth 2 (two) times daily.           Marland Kitchen  Iron-FA-B Cmp-C-Biot-Probiotic (FUSION PLUS) CAPS   Oral   Take 1 capsule by mouth 2 (two) times daily with a meal.   60 capsule   2   . lisinopril (PRINIVIL,ZESTRIL) 40 MG tablet      Take 1 tablet by mouth  daily   30 tablet   3   . meclizine (ANTIVERT) 25 MG tablet   Oral   Take 1 tablet (25 mg total) by mouth daily as needed for dizziness or nausea.   90 tablet   0   . meloxicam (MOBIC) 7.5 MG tablet      Take 1 tablet by mouth  twice a day   180 tablet   2   . Multiple Vitamin (MULTIVITAMIN) tablet   Oral   Take 1 tablet by mouth daily. Mens MVI-Take one daily         . pantoprazole (PROTONIX) 40 MG tablet   Oral   Take 1 tablet (40 mg total) by mouth daily.   30 tablet   11   . Potassium 99 MG TABS   Oral   Take 1 tablet by mouth 2 (two) times daily.            Marland Kitchen spironolactone (ALDACTONE) 50 MG tablet   Oral   Take 2 tablets (100 mg total) by mouth daily.   30 tablet   6   . Tetrahydrozoline HCl (VISINE OP)   Ophthalmic   Apply 1 drop to eye daily as needed (dry eyes).           Allergies Lactulose and Sulfonamide derivatives  Family History  Problem Relation Age of Onset  . Cancer Father     history of melanona  . Cancer Paternal Aunt     breast  . Alcohol abuse Maternal Grandfather     Social History Social History  Substance Use Topics  . Smoking status: Former Smoker -- 35 years    Types: Cigars    Quit date: 04/02/2010  . Smokeless tobacco: Never Used     Comment: smoked cigars 1 to 2 daily  . Alcohol Use: 1.2 oz/week    2 Cans of beer per week     Comment: 2 beers a day    Review of Systems  Constitutional: Negative for fever. Cardiovascular: Negative for chest pain. Respiratory: Negative for shortness of breath. Gastrointestinal: Negative for abdominal pain. Positive for bloody diarrhea and bloody vomiting. Neurological: Negative for headaches, focal weakness or numbness.  10-point ROS otherwise negative.  ____________________________________________   PHYSICAL EXAM:  VITAL SIGNS: ED Triage Vitals  Enc Vitals Group     BP 07/23/15 1942 127/58 mmHg     Pulse Rate 07/23/15 1942 67     Resp 07/23/15 1942 20     Temp 07/23/15 1942 98 F (36.7 C)     Temp Source 07/23/15 1942 Oral     SpO2 --      Weight 07/23/15 1942 175 lb (79.379 kg)     Height 07/23/15 1942 5\' 5"  (1.651 m)   Constitutional: Alert and oriented. Well appearing and in no distress. Eyes: Conjunctivae are normal. PERRL. Normal extraocular movements. ENT   Head: Normocephalic and atraumatic.   Nose: No congestion/rhinnorhea.   Mouth/Throat: Mucous membranes are moist.Slightly pale mucosa   Neck: No stridor. Hematological/Lymphatic/Immunilogical: No cervical lymphadenopathy. Cardiovascular: Normal rate,  regular rhythm.  No murmurs, rubs, or gallops. Respiratory: Normal respiratory effort without tachypnea nor retractions. Breath sounds are clear and equal bilaterally. No wheezes/rales/rhonchi. Gastrointestinal:  Soft and nontender. No distention.  Genitourinary: Deferred Musculoskeletal: Normal range of motion in all extremities. No joint effusions.  No lower extremity tenderness nor edema. Neurologic:  Normal speech and language. No gross focal neurologic deficits are appreciated.  Skin:  Skin is warm, dry and intact. No rash noted. Psychiatric: Mood and affect are normal. Speech and behavior are normal. Patient exhibits appropriate insight and judgment.  ____________________________________________    LABS (pertinent positives/negatives)  Labs Reviewed  CBC WITH DIFFERENTIAL/PLATELET - Abnormal; Notable for the following:    RBC 2.68 (*)    Hemoglobin 8.3 (*)    HCT 24.5 (*)    RDW 18.8 (*)    Monocytes Absolute 1.1 (*)    All other components within normal limits  COMPREHENSIVE METABOLIC PANEL - Abnormal; Notable for the following:    Glucose, Bld 162 (*)    BUN 29 (*)    AST 44 (*)    All other components within normal limits  PROTIME-INR - Abnormal; Notable for the following:    Prothrombin Time 18.2 (*)    All other components within normal limits  HEMOGLOBIN AND HEMATOCRIT, BLOOD - Abnormal; Notable for the following:    Hemoglobin 6.9 (*)    HCT 20.5 (*)    All other components within normal limits  APTT  TYPE AND SCREEN  PREPARE RBC (CROSSMATCH)  ABO/RH  TYPE AND SCREEN  PREPARE RBC (CROSSMATCH)     ____________________________________________   EKG  I, Nance Pear, attending physician, personally viewed and interpreted this EKG  EKG Time: 1953 Rate: 74 Rhythm: normal sinus rhythm Axis: left axis deviation Intervals: qtc 456 QRS: narrow ST changes: no st elevation Impression: normal ekg   ____________________________________________     RADIOLOGY  None  ____________________________________________   PROCEDURES  Procedure(s) performed: None  Critical Care performed: No  ____________________________________________   INITIAL IMPRESSION / ASSESSMENT AND PLAN / ED COURSE  Pertinent labs & imaging results that were available during my care of the patient were reviewed by me and considered in my medical decision making (see chart for details).  Patient presented to the emergency department today because of concerns for bloody diarrhea and vomiting. On exam patient not actively vomiting in the room. Abdomen is soft. Per chart review patient did have an endoscopy and colonoscopy last year. They did see grade 1 esophageal varices as well as some diverticulosis. At this point I have greatest concern for variceal bleed. Will order IV Protonix, octreotide and antibiotics. Additionally will type and screen the patient's blood. At this point vital signs are within normal limits.  ----------------------------------------- 9:09 PM on 07/23/2015 -----------------------------------------  Spoke with Dr. Allen Norris with GI about the patient. He stated that at this time endoscopy would not be warranted. Will plan on admission to the hospitalist service.  ____________________________________________   FINAL CLINICAL IMPRESSION(S) / ED DIAGNOSES  Final diagnoses:  Gastrointestinal hemorrhage, unspecified gastritis, unspecified gastrointestinal hemorrhage type  Anemia, unspecified anemia type     Note: This dictation was prepared with Dragon dictation. Any transcriptional errors that result from this process are unintentional    Nance Pear, MD 07/23/15 2322

## 2015-07-23 NOTE — ED Notes (Addendum)
Pt denies lightheadedness, fevers, CP, SOB or LOC.  Pt he did feel dizzy today.  Sts that he has had dark red stool and dark/bright red emesis.  Pt pale.

## 2015-07-24 ENCOUNTER — Encounter
Admission: EM | Disposition: A | Discharge status: Home / Self Care | Payer: Self-pay | Source: Home / Self Care | Attending: Internal Medicine

## 2015-07-24 ENCOUNTER — Inpatient Hospital Stay: Payer: Medicare Other | Admitting: Anesthesiology

## 2015-07-24 ENCOUNTER — Inpatient Hospital Stay: Payer: Medicare Other

## 2015-07-24 DIAGNOSIS — I8501 Esophageal varices with bleeding: Secondary | ICD-10-CM | POA: Diagnosis not present

## 2015-07-24 DIAGNOSIS — K922 Gastrointestinal hemorrhage, unspecified: Secondary | ICD-10-CM

## 2015-07-24 DIAGNOSIS — R110 Nausea: Secondary | ICD-10-CM

## 2015-07-24 DIAGNOSIS — K7030 Alcoholic cirrhosis of liver without ascites: Principal | ICD-10-CM

## 2015-07-24 DIAGNOSIS — I8511 Secondary esophageal varices with bleeding: Secondary | ICD-10-CM | POA: Insufficient documentation

## 2015-07-24 DIAGNOSIS — K920 Hematemesis: Secondary | ICD-10-CM | POA: Insufficient documentation

## 2015-07-24 HISTORY — PX: ESOPHAGOGASTRODUODENOSCOPY: SHX5428

## 2015-07-24 LAB — BASIC METABOLIC PANEL
Anion gap: 3 — ABNORMAL LOW (ref 5–15)
BUN: 38 mg/dL — AB (ref 6–20)
CALCIUM: 8.2 mg/dL — AB (ref 8.9–10.3)
CHLORIDE: 110 mmol/L (ref 101–111)
CO2: 24 mmol/L (ref 22–32)
CREATININE: 0.84 mg/dL (ref 0.61–1.24)
GFR calc Af Amer: >60 mL/min (ref >60)
GFR calc non Af Amer: >60 mL/min (ref >60)
GLUCOSE: 169 mg/dL — AB (ref 65–99)
Potassium: 5.7 mmol/L — ABNORMAL HIGH (ref 3.5–5.1)
Sodium: 137 mmol/L (ref 135–145)

## 2015-07-24 LAB — BLOOD GAS, ARTERIAL
ALLENS TEST (PASS/FAIL): POSITIVE — AB
ALLENS TEST (PASS/FAIL): POSITIVE — AB
Acid-Base Excess: 0.8 mmol/L (ref 0.0–3.0)
Acid-base deficit: 1.7 mmol/L (ref 0.0–2.0)
Acid-base deficit: 4.4 mmol/L — ABNORMAL HIGH (ref 0.0–2.0)
BICARBONATE: 23 meq/L (ref 21.0–28.0)
BICARBONATE: 25.3 meq/L (ref 21.0–28.0)
FIO2: 0.5
FIO2: 0.4
MECHANICAL RATE: 12
MECHVT: 500 mL
O2 Saturation: 99.8 %
O2 Saturation: 93.9 %
PATIENT TEMPERATURE: 37
PEEP/CPAP: 5 cmH2O
PEEP: 5 cmH2O
PO2 ART: 220 mmHg — AB (ref 83.0–108.0)
Patient temperature: 37
Pressure support: 5 cmH2O
pCO2 arterial: 38 mmHg (ref 32.0–48.0)
pCO2 arterial: 39 mmHg (ref 32.0–48.0)
pH, Arterial: 7.39 (ref 7.350–7.450)
pH, Arterial: 7.42 (ref 7.350–7.450)
pO2, Arterial: 69 mmHg — ABNORMAL LOW (ref 83.0–108.0)

## 2015-07-24 LAB — HEMOGLOBIN AND HEMATOCRIT, BLOOD
HCT: 21.9 % — ABNORMAL LOW (ref 40.0–52.0)
HCT: 20.7 % — ABNORMAL LOW (ref 40.0–52.0)
HCT: 21.5 % — ABNORMAL LOW (ref 40.0–52.0)
HCT: 21 % — ABNORMAL LOW (ref 40.0–52.0)
HEMATOCRIT: 22.4 % — AB (ref 40.0–52.0)
HEMOGLOBIN: 7.7 g/dL — AB (ref 13.0–18.0)
HEMOGLOBIN: 7.6 g/dL — AB (ref 13.0–18.0)
HEMOGLOBIN: 7.1 g/dL — AB (ref 13.0–18.0)
Hemoglobin: 7.3 g/dL — ABNORMAL LOW (ref 13.0–18.0)
Hemoglobin: 7.3 g/dL — ABNORMAL LOW (ref 13.0–18.0)

## 2015-07-24 LAB — GLUCOSE, CAPILLARY
GLUCOSE-CAPILLARY: 159 mg/dL — AB (ref 65–99)
GLUCOSE-CAPILLARY: 122 mg/dL — AB (ref 65–99)
Glucose-Capillary: 122 mg/dL — ABNORMAL HIGH (ref 65–99)
Glucose-Capillary: 135 mg/dL — ABNORMAL HIGH (ref 65–99)
Glucose-Capillary: 146 mg/dL — ABNORMAL HIGH (ref 65–99)

## 2015-07-24 LAB — POTASSIUM: Potassium: 4.7 mmol/L (ref 3.5–5.1)

## 2015-07-24 LAB — AMMONIA: AMMONIA: 193 umol/L — AB (ref 9–35)

## 2015-07-24 LAB — MRSA PCR SCREENING: MRSA BY PCR: POSITIVE — AB

## 2015-07-24 LAB — PREPARE RBC (CROSSMATCH)

## 2015-07-24 SURGERY — EGD (ESOPHAGOGASTRODUODENOSCOPY)
Anesthesia: General | Wound class: Dirty or Infected

## 2015-07-24 MED ORDER — DEXTROSE-NACL 5-0.45 % IV SOLN
INTRAVENOUS | Status: DC
  Administered 2015-07-24 – 2015-07-30 (×9): via INTRAVENOUS

## 2015-07-24 MED ORDER — CHLORHEXIDINE GLUCONATE 0.12% ORAL RINSE (MEDLINE KIT)
15.0000 mL | Freq: Two times a day (BID) | OROMUCOSAL | Status: DC
  Administered 2015-07-24: 15 mL via OROMUCOSAL
  Filled 2015-07-24 (×3): qty 15

## 2015-07-24 MED ORDER — ACETAMINOPHEN 650 MG RE SUPP: 650.0000 mg | Freq: Four times a day (QID) | RECTAL | Status: DC | PRN

## 2015-07-24 MED ORDER — DEXTROSE 5 % IV SOLN
1.0000 g | INTRAVENOUS | Status: AC
  Administered 2015-07-24 – 2015-07-30 (×7): 1 g via INTRAVENOUS
  Filled 2015-07-24 (×7): qty 10

## 2015-07-24 MED ORDER — MORPHINE SULFATE (PF) 2 MG/ML IV SOLN
1.0000 mg | Freq: Once | INTRAVENOUS | Status: AC
  Administered 2015-07-24: 1 mg via INTRAVENOUS
  Filled 2015-07-24: qty 1

## 2015-07-24 MED ORDER — SODIUM CHLORIDE 0.9 % IV SOLN
INTRAVENOUS | Status: DC | PRN
  Administered 2015-07-24: 01:00:00 via INTRAVENOUS

## 2015-07-24 MED ORDER — ADULT MULTIVITAMIN W/MINERALS CH
1.0000 | ORAL_TABLET | Freq: Every day | ORAL | Status: DC
  Administered 2015-07-25 – 2015-07-30 (×4): 1 via ORAL
  Filled 2015-07-24 (×7): qty 1

## 2015-07-24 MED ORDER — SODIUM CHLORIDE 0.9 % IV SOLN
0.0000 mg/h | INTRAVENOUS | Status: DC
  Administered 2015-07-24: 2 mg/h via INTRAVENOUS
  Filled 2015-07-24 (×2): qty 10

## 2015-07-24 MED ORDER — MIDAZOLAM HCL 2 MG/2ML IJ SOLN
1.0000 mg | INTRAMUSCULAR | Status: DC | PRN
  Administered 2015-07-24 (×2): 2 mg via INTRAVENOUS
  Filled 2015-07-24 (×2): qty 2

## 2015-07-24 MED ORDER — SODIUM CHLORIDE 0.9% FLUSH
3.0000 mL | Freq: Two times a day (BID) | INTRAVENOUS | Status: DC
  Administered 2015-07-24 – 2015-07-30 (×13): 3 mL via INTRAVENOUS

## 2015-07-24 MED ORDER — LORAZEPAM 2 MG/ML IJ SOLN: 1.0000 mg | Freq: Four times a day (QID) | INTRAMUSCULAR | Status: DC | PRN

## 2015-07-24 MED ORDER — LORAZEPAM 2 MG/ML IJ SOLN
2.0000 mg | INTRAMUSCULAR | Status: DC | PRN
Start: 2015-07-24 — End: 2015-07-24

## 2015-07-24 MED ORDER — MIDAZOLAM HCL 2 MG/2ML IJ SOLN
INTRAMUSCULAR | Status: AC
  Administered 2015-07-24: 2 mg
  Filled 2015-07-24: qty 2

## 2015-07-24 MED ORDER — ONDANSETRON HCL 4 MG/2ML IJ SOLN: 4.0000 mg | Freq: Once | INTRAMUSCULAR | Status: DC | PRN

## 2015-07-24 MED ORDER — VITAMIN B-1 100 MG PO TABS
100.0000 mg | ORAL_TABLET | Freq: Every day | ORAL | Status: DC
  Administered 2015-07-25: 100 mg via ORAL
  Filled 2015-07-24 (×2): qty 1

## 2015-07-24 MED ORDER — FENTANYL CITRATE (PF) 100 MCG/2ML IJ SOLN: 25.0000 ug | INTRAMUSCULAR | Status: DC | PRN

## 2015-07-24 MED ORDER — ONDANSETRON HCL 4 MG/2ML IJ SOLN: 4.0000 mg | Freq: Four times a day (QID) | INTRAMUSCULAR | Status: DC | PRN

## 2015-07-24 MED ORDER — FENTANYL 2500MCG IN NS 250ML (10MCG/ML) PREMIX INFUSION
INTRAVENOUS | Status: AC
  Filled 2015-07-24: qty 250

## 2015-07-24 MED ORDER — ACETAMINOPHEN 325 MG PO TABS
650.0000 mg | ORAL_TABLET | Freq: Four times a day (QID) | ORAL | Status: DC | PRN
  Administered 2015-07-30: 650 mg via ORAL
  Filled 2015-07-24: qty 2

## 2015-07-24 MED ORDER — LORAZEPAM 2 MG/ML IJ SOLN
0.0000 mg | Freq: Four times a day (QID) | INTRAMUSCULAR | Status: DC
Start: 2015-07-24 — End: 2015-07-24

## 2015-07-24 MED ORDER — FENTANYL CITRATE (PF) 100 MCG/2ML IJ SOLN: 50.0000 ug | INTRAMUSCULAR | Status: DC | PRN

## 2015-07-24 MED ORDER — ONDANSETRON HCL 4 MG PO TABS: 4.0000 mg | ORAL_TABLET | Freq: Four times a day (QID) | ORAL | Status: DC | PRN

## 2015-07-24 MED ORDER — LACTULOSE 10 GM/15ML PO SOLN
30.0000 g | Freq: Two times a day (BID) | ORAL | Status: DC
  Administered 2015-07-24 – 2015-07-25 (×4): 30 g via ORAL
  Filled 2015-07-24 (×4): qty 60

## 2015-07-24 MED ORDER — LORAZEPAM 0.5 MG PO TABS: 1.0000 mg | ORAL_TABLET | Freq: Four times a day (QID) | ORAL | Status: DC | PRN

## 2015-07-24 MED ORDER — DEXTROSE 5 % IV SOLN
1.0000 mg/h | INTRAVENOUS | Status: DC
  Filled 2015-07-24: qty 25

## 2015-07-24 MED ORDER — ANTISEPTIC ORAL RINSE SOLUTION (CORINZ)
7.0000 mL | OROMUCOSAL | Status: DC
  Administered 2015-07-24 – 2015-07-25 (×9): 7 mL via OROMUCOSAL
  Filled 2015-07-24 (×17): qty 7

## 2015-07-24 MED ORDER — LORAZEPAM 2 MG/ML IJ SOLN: 2.0000 mg | INTRAMUSCULAR | Status: DC | PRN

## 2015-07-24 MED ORDER — FOLIC ACID 1 MG PO TABS
1.0000 mg | ORAL_TABLET | Freq: Every day | ORAL | Status: DC
  Administered 2015-07-25: 1 mg via ORAL
  Filled 2015-07-24 (×3): qty 1

## 2015-07-24 MED ORDER — FENTANYL CITRATE (PF) 100 MCG/2ML IJ SOLN
INTRAMUSCULAR | Status: DC | PRN
  Administered 2015-07-24 (×2): 50 ug via INTRAVENOUS

## 2015-07-24 MED ORDER — THIAMINE HCL 100 MG/ML IJ SOLN: 100.0000 mg | Freq: Every day | INTRAMUSCULAR | Status: DC

## 2015-07-24 MED ORDER — FENTANYL 2500MCG IN NS 250ML (10MCG/ML) PREMIX INFUSION
0.0000 ug/h | INTRAVENOUS | Status: DC
Start: 2015-07-24 — End: 2015-07-25
  Administered 2015-07-24: 200 ug/h via INTRAVENOUS

## 2015-07-24 MED ORDER — INSULIN ASPART 100 UNIT/ML ~~LOC~~ SOLN
2.0000 [IU] | SUBCUTANEOUS | Status: DC
  Administered 2015-07-24: 4 [IU] via SUBCUTANEOUS
  Administered 2015-07-24 – 2015-07-29 (×10): 2 [IU] via SUBCUTANEOUS
  Filled 2015-07-24: qty 4
  Filled 2015-07-24 (×10): qty 2

## 2015-07-24 MED ORDER — LORAZEPAM 2 MG/ML IJ SOLN: 0.0000 mg | Freq: Two times a day (BID) | INTRAMUSCULAR | Status: DC

## 2015-07-24 MED ORDER — ROCURONIUM BROMIDE 100 MG/10ML IV SOLN
INTRAVENOUS | Status: DC | PRN
  Administered 2015-07-24: 50 mg via INTRAVENOUS

## 2015-07-24 SURGICAL SUPPLY — 1 items: MBL-6 SIX SHOOTER SAEED MULTI-BAND LIGATOR ×1 IMPLANT

## 2015-07-24 NOTE — Anesthesia Preprocedure Evaluation (Signed)
Anesthesia Evaluation  Patient identified by MRN, date of birth, ID band Patient awake    Reviewed: Allergy & Precautions, H&P , NPO status , Patient's Chart, lab work & pertinent test results, reviewed documented beta blocker date and time   Airway Mallampati: II   Neck ROM: full    Dental  (+) Poor Dentition   Pulmonary neg pulmonary ROS, sleep apnea , former smoker,    Pulmonary exam normal        Cardiovascular hypertension, negative cardio ROS Normal cardiovascular exam     Neuro/Psych  Headaches, PSYCHIATRIC DISORDERS  Neuromuscular disease negative neurological ROS  negative psych ROS   GI/Hepatic negative GI ROS, Neg liver ROS, hiatal hernia, PUD, GERD  Medicated,  Endo/Other  negative endocrine ROS  Renal/GU Renal diseasenegative Renal ROS  negative genitourinary   Musculoskeletal   Abdominal   Peds  Hematology negative hematology ROS (+)   Anesthesia Other Findings Past Medical History:   Colon polyps                                    08/19/2006     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   Hyperlipidemia                                  06/2002      Hypertension                                                 Arthritis                                                      Comment:In back and all joints   Depression                                                   Chronic headache                                             GI bleed                                                     Kidney stones                                                GERD (gastroesophageal reflux disease)                       Hiatal hernia  Esophageal varices (HCC)                                   Past Surgical History:   CARPAL TUNNEL RELEASE                            2005           Comment: / right hand   TONSILLECTOMY AND ADENOIDECTOMY                              Comment:65 years old   VASECTOMY                                                   BMI    Body Mass Index   29.12 kg/m 2     Reproductive/Obstetrics                             Anesthesia Physical Anesthesia Plan  ASA: III and emergent  Anesthesia Plan: General and General ETT   Post-op Pain Management:    Induction: Intravenous, Rapid sequence and Cricoid pressure planned  Airway Management Planned:   Additional Equipment:   Intra-op Plan:   Post-operative Plan:   Informed Consent: I have reviewed the patients History and Physical, chart, labs and discussed the procedure including the risks, benefits and alternatives for the proposed anesthesia with the patient or authorized representative who has indicated his/her understanding and acceptance.   Dental Advisory Given  Plan Discussed with: CRNA  Anesthesia Plan Comments:         Anesthesia Quick Evaluation

## 2015-07-24 NOTE — Progress Notes (Signed)
Patient extubated and placed on 4LPM [place] per MD order.  Patient suctioned prior to extubation.  Family remained in room for extubation and patient tolerated well.  Will continue to monitor.

## 2015-07-24 NOTE — H&P (Signed)
Lucilla Lame, MD [address] [place], [place] [address] Phone: [phone] Fax : [phone]  Primary Care Physician:  Arnette Norris, MD Primary Gastroenterologist:  Dr. Allen Norris  Pre-Procedure History & Physical: HPI:  Jeff Ward is a 65 y.o. male is here for an endoscopy.   Past Medical History  Diagnosis Date  . 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 Hyperlipidemia 06/2002  . Hypertension   . Arthritis     In back and all joints  . Depression   . Chronic headache   . GI bleed   . Kidney stones   . GERD (gastroesophageal reflux disease)   . Hiatal hernia   . Esophageal varices (HCC)     Past Surgical History  Procedure Laterality Date  . Carpal tunnel release  2005     / right hand  . Tonsillectomy and adenoidectomy      65 years old  . Vasectomy      Prior to Admission medications   Medication Sig Start Date End Date Taking? Authorizing Provider  diclofenac (VOLTAREN) 75 MG EC tablet Take 75 mg by mouth 2 (two) times daily as needed for mild pain.    Yes Historical Provider, MD  DULoxetine (CYMBALTA) 30 MG capsule Take 30 mg by mouth daily.   Yes Historical Provider, MD  EPINEPHrine (EPIPEN 2-PAK) 0.3 mg/0.3 mL IJ SOAJ injection Inject 0.3 mg into the muscle once as needed (for severe allergic reaction).   Yes Historical Provider, MD  fenofibrate (TRICOR) 145 MG tablet Take 145 mg by mouth daily.   Yes Historical Provider, MD  furosemide (LASIX) 10 MG/ML solution Take 2 mLs (20 mg total) by mouth daily. 06/18/15  Yes Ladene Artist, MD  glucosamine-chondroitin 500-400 MG tablet Take 1 tablet by mouth 2 (two) times daily.     Yes Historical Provider, MD  Iron-FA-B Cmp-C-Biot-Probiotic (FUSION PLUS) CAPS Take 1 capsule by mouth 2 (two) times daily with a meal. 06/13/15  Yes Ladene Artist, MD  lisinopril (PRINIVIL,ZESTRIL) 40 MG tablet Take 40 mg by mouth daily.   Yes Historical Provider, MD  meclizine (ANTIVERT) 25 MG tablet Take 1  tablet (25 mg total) by mouth daily as needed for dizziness or nausea. 10/25/14 10/25/15 Yes Ladene Artist, MD  meloxicam (MOBIC) 7.5 MG tablet Take 7.5 mg by mouth 2 (two) times daily.   Yes Historical Provider, MD  Multiple Vitamin (MULTIVITAMIN WITH MINERALS) TABS tablet Take 1 tablet by mouth daily.   Yes Historical Provider, MD  pantoprazole (PROTONIX) 40 MG tablet Take 1 tablet (40 mg total) by mouth daily. 06/13/15  Yes Ladene Artist, MD  Potassium 99 MG TABS Take 99 mg by mouth 2 (two) times daily.    Yes Historical Provider, MD  spironolactone (ALDACTONE) 50 MG tablet Take 2 tablets (100 mg total) by mouth daily. 06/18/15  Yes Ladene Artist, MD  tetrahydrozoline 0.05 % ophthalmic solution Place 1 drop into both eyes daily.   Yes Historical Provider, MD    Allergies as of 07/23/2015 - Review Complete 07/23/2015  Allergen Reaction Noted  . Bee venom Anaphylaxis 07/23/2015  . Sulfa antibiotics Other (See Comments) 07/23/2015    Family History  Problem Relation Age of Onset  . Cancer Father     history of melanona  . Cancer Paternal Aunt     breast  . Alcohol abuse Maternal Grandfather     Social History   Social History  . Marital  Status: Married    Spouse Name: N/A  . Number of Children: 2  . Years of Education: N/A   Occupational History  . [employer]-Maintence     currently unemployed  .     Social History Main Topics  . Smoking status: Former Smoker -- 35 years    Types: Cigars    Quit date: 04/02/2010  . Smokeless tobacco: Never Used     Comment: smoked cigars 1 to 2 daily  . Alcohol Use: 1.2 oz/week    2 Cans of beer per week     Comment: 2 beers a day  . Drug Use: No  . Sexual Activity:    Partners: Female     Comment: declined condoms   Other Topics Concern  . Not on file   Social History Narrative    Review of Systems: See HPI, otherwise negative ROS  Physical Exam: BP 106/47 mmHg  Pulse 63  Temp(Src) 97.6 F (36.4 C) (Oral)   Resp 16  Ht 5\' 5"  (1.651 m)  Wt 175 lb (79.379 kg)  BMI 29.12 kg/m2  SpO2 100% General:   Alert,  pleasant and cooperative in NAD Head:  Normocephalic and atraumatic. Neck:  Supple; no masses or thyromegaly. Lungs:  Clear throughout to auscultation.    Heart:  Regular rate and rhythm. Abdomen:  Soft, nontender and nondistended. Normal bowel sounds, without guarding, and without rebound.   Neurologic:  Alert and  oriented x4;  grossly normal neurologically.  Impression/Plan: Jeff Ward is here for an endoscopy to be performed for upper GI bleed.  Risks, benefits, limitations, and alternatives regarding  endoscopy have been reviewed with the patient.  Questions have been answered.  All parties agreeable.   Lucilla Lame, MD  07/24/2015, 12:47 AM

## 2015-07-24 NOTE — Progress Notes (Addendum)
eLink Physician-Brief Progress Note Patient Name: Jeff Ward DOB: [birth year] MRN: [id number]   Date of Service  07/24/2015  HPI/Events of Note  65 yo male with PMH of ETOH abuse. UGI bleed d/t distal esophageal varicies s/p EGD and incomplete banding. Protonix and Octreotide IV infusions. Hgb = 6.9 >> being transfused. Now intubated and mechanically ventilated.   eICU Interventions  Will order: 1. Ventilator orders: 50%/PRVC 12/TV 500/P 5. 2. ABG in 1 hour. 3. Versed IV infusion. Titrate to RASS 0 to -1. 4. Versed 2 mg IV Q 1 hour PRN.     Intervention Category Evaluation Type: New Patient Evaluation  Sommer,Steven Eugene 07/24/2015, 2:18 AM

## 2015-07-24 NOTE — Progress Notes (Signed)
eLink Physician-Brief Progress Note Patient Name: LINDWOOD DEBUHR DOB: 02-08-1950 MRN: [id number]   Date of Service  07/24/2015  HPI/Events of Note  Hgb = 7.7  eICU Interventions  Continue current management. Transfuse for Hgb < 7.0.     Intervention Category Intermediate Interventions: Other:  Kazeem, Zambito 07/24/2015, 6:39 AM

## 2015-07-24 NOTE — Anesthesia Procedure Notes (Signed)
Procedure Name: Intubation Date/Time: 07/24/2015 12:56 AM Performed by: Lendon Colonel Pre-anesthesia Checklist: Emergency Drugs available, Patient identified, Suction available, Patient being monitored and Timeout performed Patient Re-evaluated:Patient Re-evaluated prior to inductionOxygen Delivery Method: Circle system utilized Preoxygenation: Pre-oxygenation with 100% oxygen Intubation Type: IV induction and Cricoid Pressure applied Laryngoscope Size: Miller and 2 Grade View: Grade I Tube type: Oral Number of attempts: 1 Airway Equipment and Method: Stylet Placement Confirmation: ETT inserted through vocal cords under direct vision,  positive ETCO2,  CO2 detector and breath sounds checked- equal and bilateral Secured at: 21 cm Tube secured with: Tape Dental Injury: Teeth and Oropharynx as per pre-operative assessment

## 2015-07-24 NOTE — Progress Notes (Signed)
Dr Rama notified of patients ammonia level. At this time no additional orders

## 2015-07-24 NOTE — Progress Notes (Signed)
Lucilla Lame, MD [facility]   [address] [place], [place] [address] Phone: [phone] Fax : [phone]   Subjective: The patient without any further sign of GI bleeding. The patient was extubated today. The patient is still lethargic. The patient had bleeding from an esophageal varix treated with banding last night.   Objective: Vital signs in last 24 hours: Filed Vitals:   07/24/15 1000 07/24/15 1100 07/24/15 1200 07/24/15 1300  BP: 129/63 127/67 116/58 128/64  Pulse: 75 81 79 76  Temp:      TempSrc:      Resp: 10 13 11 13   Height:      Weight:      SpO2: 100% 100% 100% 100%   Weight change:   Intake/Output Summary (Last 24 hours) at 07/24/15 1341 Last data filed at 07/24/15 1238  Gross per 24 hour  Intake   1495 ml  Output    770 ml  Net    725 ml     Exam: Heart:: Regular rate and rhythm Lungs: clear to auscultation Abdomen: soft, nontender, normal bowel sounds   Lab Results: @LABTEST2 @ Micro Results: Recent Results (from the past 240 hour(s))  MRSA PCR Screening     Status: Abnormal   Collection Time: 07/24/15  2:29 AM  Result Value Ref Range Status   MRSA by PCR POSITIVE (A) NEGATIVE Final    Comment:        The GeneXpert MRSA Assay (FDA approved for NASAL specimens only), is one component of a comprehensive MRSA colonization surveillance program. It is not intended to diagnose MRSA infection nor to guide or monitor treatment for MRSA infections. CRITICAL RESULT CALLED TO, READ BACK BY AND VERIFIED WITH: CALLED TESS THOMAS AT 6701 ON 07/24/15 RWW    Studies/Results: Dg Chest 1 View  07/24/2015  CLINICAL DATA:  Respiratory failure. EXAM: CHEST 1 VIEW COMPARISON:  None. FINDINGS: The endotracheal tube is at the level of the clavicular heads. The lungs are grossly clear. No pneumothorax. No large effusion IMPRESSION: ET tube at the level of the clavicular heads. Electronically Signed   By: Andreas Newport M.D.   On: 07/24/2015 02:21    Medications: I have reviewed the patient's current medications. Scheduled Meds: . antiseptic oral rinse  7 mL Mouth Rinse 10 times per day  . cefTRIAXone (ROCEPHIN)  IV  1 g Intravenous Q24H  . chlorhexidine gluconate (SAGE KIT)  15 mL Mouth Rinse BID  . fentaNYL      . folic acid  1 mg Oral Daily  . insulin aspart  2-6 Units Subcutaneous Q4H  . lactulose  30 g Oral BID  . multivitamin with minerals  1 tablet Oral Daily  . [START ON 07/27/2015] pantoprazole  40 mg Intravenous Q12H  . sodium chloride flush  3 mL Intravenous Q12H  . thiamine  100 mg Oral Daily   Or  . thiamine  100 mg Intravenous Daily   Continuous Infusions: . dextrose 5 % and 0.45% NaCl 75 mL/hr at 07/24/15 1239  . fentaNYL infusion INTRAVENOUS Stopped (07/24/15 0720)  . midazolam (VERSED) infusion Stopped (07/24/15 0720)  . octreotide  (SANDOSTATIN)    IV infusion 50 mcg/hr (07/24/15 1238)  . pantoprozole (PROTONIX) infusion 8 mg/hr (07/24/15 1238)   PRN Meds:.acetaminophen **OR** acetaminophen, fentaNYL (SUBLIMAZE) injection, fentaNYL (SUBLIMAZE) injection, midazolam, ondansetron **OR** ondansetron (ZOFRAN) IV, ondansetron (ZOFRAN) IV   Assessment: Principal Problem:   UGIB (upper gastrointestinal bleed) Active Problems:   HLD (hyperlipidemia)   Essential hypertension, benign  Alcoholic cirrhosis (HCC)   GI bleed   Hematemesis with nausea   Secondary esophageal varices with bleeding ([place])    Plan: This patient is status post an upper GI bleed from esophageal varices. The patient had a liver biopsy done last year that showed him to have a fibrosis score of 3 with fatty infiltrate. This is likely from his liver disease. The patient is still on octreotide. I would continue monitoring his blood counts and transfuse as needed without going over her hemoglobin of 10. Although the liver biopsy did not show cirrhosis there has likely been any interval change due to the patient's recent decompensation.   LOS: 1  day   Lucilla Lame 07/24/2015, 1:41 PM

## 2015-07-24 NOTE — Progress Notes (Signed)
eLink Physician-Brief Progress Note Patient Name: Jeff Ward DOB: 08/07/1950 MRN: [id number]   Date of Service  07/24/2015  HPI/Events of Note  Agitation - In spite of Versed IV infusion at ceiling.   eICU Interventions  Will order: 1. Fentanyl 50-100 mcg IV Q 1 hour PRN.  2. Fentanyl IV infusion. Titrate to RASS = 0 to -1.     Intervention Category Minor Interventions: Agitation / anxiety - evaluation and management  Sommer,Steven Eugene 07/24/2015, 3:51 AM

## 2015-07-24 NOTE — Progress Notes (Signed)
Extubated patient. On 4 liters, lethargic but at times will follow some simple commands.

## 2015-07-24 NOTE — Op Note (Signed)
[facility] Patient Name: Jeff Ward Procedure Date: 07/24/2015 12:37 AM MRN: [id number] Account #: [id number] Date of Birth: 1950/02/07 Admit Type: Emergency Department Age: 65 Room: [facility] ENDO ROOM 4 Gender: Male Note Status: Finalized Procedure:            Upper GI endoscopy Indications:          Hematemesis Providers:            Lucilla Lame, MD Medicines:            General Anesthesia Complications:        No immediate complications. Procedure:            Pre-Anesthesia Assessment:                       - Prior to the procedure, a History and Physical was                        performed, and patient medications and allergies were                        reviewed. The patient's tolerance of previous                        anesthesia was also reviewed. The risks and benefits of                        the procedure and the sedation options and risks were                        discussed with the patient. All questions were                        answered, and informed consent was obtained. Prior                        Anticoagulants: The patient has taken no previous                        anticoagulant or antiplatelet agents. ASA Grade                        Assessment: III - A patient with severe systemic                        disease. After reviewing the risks and benefits, the                        patient was deemed in satisfactory condition to undergo                        the procedure.                       After obtaining informed consent, the endoscope was                        passed under direct vision. Throughout the procedure,                        the patient's blood pressure, pulse, and oxygen  saturations were monitored continuously. The Endoscope                        was introduced through the mouth, and advanced to the                        second part of duodenum. The upper GI endoscopy was                    accomplished without difficulty. The patient tolerated                        the procedure well. Findings:      Spurting grade III varices were found in the lower third of the       esophagus,. Stigmata of recent bleeding were evident and red wale signs       were present. Five bands were successfully placed with incomplete       eradication of varices. There was no bleeding at the end of the       procedure.      Clotted blood was found in the entire examined stomach.      The examined duodenum was normal. Impression:           - Bleeding grade III esophageal varices. Incompletely                        eradicated. Banded.                       - Clotted blood in the entire stomach.                       - Normal examined duodenum.                       - No specimens collected. Recommendation:       - Follow Hb.                       Admit to ICU. Procedure Code(s):    --- Professional ---                       [phone], Esophagogastroduodenoscopy, flexible, transoral;                        with band ligation of esophageal/gastric varices Diagnosis Code(s):    --- Professional ---                       K92.0, Hematemesis                       I85.01, Esophageal varices with bleeding                       K92.2, Gastrointestinal hemorrhage, unspecified CPT copyright 2016 American Medical Association. All rights reserved. The codes documented in this report are preliminary and upon coder review may  be revised to meet current compliance requirements. Lucilla Lame, MD 07/24/2015 1:17:56 AM This report has been signed electronically. Number of Addenda: 0 Note Initiated On: 07/24/2015 12:37 AM      [facility]

## 2015-07-24 NOTE — Anesthesia Postprocedure Evaluation (Signed)
Anesthesia Post Note  Patient: Jeff Ward  Procedure(s) Performed: Procedure(s) (LRB): ESOPHAGOGASTRODUODENOSCOPY (EGD) (N/A)  Patient location during evaluation: ICU Anesthesia Type: General Level of consciousness: sedated (on fentanyl and versed gtts) Pain management: pain level controlled (fentanyl gtt) Vital Signs Assessment: post-procedure vital signs reviewed and stable Respiratory status: patient on ventilator - see flowsheet for VS Cardiovascular status: stable Anesthetic complications: no Comments: Pt remains intubated per ICU doctors    Last Vitals:  Filed Vitals:   07/24/15 0500 07/24/15 0600  BP: 102/51 111/66  Pulse: 79 77  Temp:    Resp: 11 12    Last Pain:  Filed Vitals:   07/24/15 0613  PainSc: 0-No pain                 Lanora Manis

## 2015-07-24 NOTE — Progress Notes (Signed)
[facility] [place] NAME: Jeff Ward    MR#:  [id number]  DATE OF BIRTH:  20-Sep-1950  SUBJECTIVE:  CHIEF COMPLAINT:   Chief Complaint  Patient presents with  . Hematemesis  . Rectal Bleeding     Came in with black stools and vomiting blood.   Transfused overnight and was intubated for airway protection after esophageal or ischial banding was done urgently last night by GI. Hemoglobin stable and patient is extubated today morning. Had a bowel movement with some blood clots and dark stool.  REVIEW OF SYSTEMS:   Patient is drowsy and not able to give a review of system.  ROS  DRUG ALLERGIES:   Allergies  Allergen Reactions  . Bee Venom Anaphylaxis  . Sulfa Antibiotics Other (See Comments)    Reaction:  Fainting     VITALS:  Blood pressure 128/64, pulse 76, temperature 98 F (36.7 C), temperature source Oral, resp. rate 13, height 5\' 5"  (1.651 m), weight 79.379 kg (175 lb), SpO2 100 %.  PHYSICAL EXAMINATION:  GENERAL:  65 y.o.-year-old patient lying in the bed with no acute distress. Drowsy EYES: Pupils equal, round, reactive to light and accommodation. No scleral icterus.  Conjunctiva pale. HEENT: Head atraumatic, normocephalic. Oropharynx and nasopharynx clear.  NECK:  Supple, no jugular venous distention. No thyroid enlargement, no tenderness.  LUNGS: Normal breath sounds bilaterally, no wheezing, rales,rhonchi or crepitation. No use of accessory muscles of respiration.  CARDIOVASCULAR: S1, S2 normal. No murmurs, rubs, or gallops.  ABDOMEN: Soft, nontender, nondistended. Bowel sounds present. No organomegaly or mass. I saw his bowel movement, which was having dark color and mostly there are blood clots and not much stool material. EXTREMITIES: No pedal edema, cyanosis, or clubbing.  NEUROLOGIC: Patient is drowsy and very less response to stimuli.  PSYCHIATRIC: The patient is drowsy. SKIN: No obvious rash, lesion, or ulcer.    Physical Exam LABORATORY PANEL:   CBC  Recent Labs Lab 07/23/15 1959  07/24/15 1342  WBC 8.6  --   --   HGB 8.3*  < > 7.1*  HCT 24.5*  < > 20.7*  PLT 176  --   --   < > = values in this interval not displayed. ------------------------------------------------------------------------------------------------------------------  Chemistries   Recent Labs Lab 07/23/15 1959 07/24/15 0518 07/24/15 1020  NA 139 137  --   K 4.9 5.7* 4.7  CL 106 110  --   CO2 26 24  --   GLUCOSE 162* 169*  --   BUN 29* 38*  --   CREATININE 0.89 0.84  --   CALCIUM 9.4 8.2*  --   AST 44*  --   --   ALT 22  --   --   ALKPHOS 69  --   --   BILITOT 0.9  --   --    ------------------------------------------------------------------------------------------------------------------  Cardiac Enzymes No results for input(s): TROPONINI in the last 168 hours. ------------------------------------------------------------------------------------------------------------------  RADIOLOGY:  Dg Chest 1 View  07/24/2015  CLINICAL DATA:  Respiratory failure. EXAM: CHEST 1 VIEW COMPARISON:  None. FINDINGS: The endotracheal tube is at the level of the clavicular heads. The lungs are grossly clear. No pneumothorax. No large effusion IMPRESSION: ET tube at the level of the clavicular heads. Electronically Signed   By: Andreas Newport M.D.   On: 07/24/2015 02:21    ASSESSMENT AND PLAN:   Principal Problem:   UGIB (upper gastrointestinal bleed) Active Problems:   HLD (hyperlipidemia)  Essential hypertension, benign   Alcoholic cirrhosis (HCC)   GI bleed   Hematemesis with nausea   Secondary esophageal varices with bleeding ([place])  . UGIB (upper gastrointestinal bleed) . Alcoholic cirrhosis ([place]) -Admitted to ICU, intubated for airway protection and extubated today. -Protonix drip, octreotide  drip -Serial H&H , received blood transfusion last night. -Nothing by mouth. INR slightly high. - stat  transfusion of 2 packed red blood cells given in ER.  - Esophageal vericeal banding was done urgently overnight by GI. - Now continue monitoring, hemoglobin appears stable.  * Hepatic encephalopathy   Ammonia is elevated, continue lactulose.  Alcohol abuse -CIWA protocol, not much signs of withdrawal.  . HLD (hyperlipidemia) -Stable, home medications held  . Essential hypertension, benign -Currently stable, home medications held  * Hyperkalemia   Now came down.    All the records are reviewed and case discussed with Care Management/Social Workerr. Management plans discussed with the patient, family and they are in agreement.  CODE STATUS: Full  TOTAL TIME TAKING CARE OF THIS PATIENT: 40 critical care minutes.  Condition critical due to severe GI bleed. An requirement of intubation for airway protection.   POSSIBLE D/C IN 1-2 DAYS, DEPENDING ON CLINICAL CONDITION.   Vaughan Basta M.D on 07/24/2015   Between 7am to 6pm - Pager - [phone]  After 6pm go to [email or web address] - password [email or web address] [facility]  Office  [phone]  CC: Primary care physician; Arnette Norris, MD  Note: This dictation was prepared with Dragon dictation along with smaller phrase technology. Any transcriptional errors that result from this process are unintentional.

## 2015-07-24 NOTE — Consult Note (Signed)
[facility] Consultation     ASSESSMENT/PLAN    PULMONARY A: Acute respiratory failure, patient was intubated for respiratory protection, has remained intubated overnight. -Current vent settings, PRVC/12/500/5/40% -ABG reviewed 7.39/38/220/23-consistent with compensated respiratory failure. -Chest images x-ray from 07/24/15 reviewed: Unremarkable, ET tube slightly elevated.  P: -We will perform a sedation vacation today, wean as tolerated and extubate if able. -The patient cannot be extubated. We'll advance ET tube.  CARDIOVASCULAR A:Essential hypertension, on lisinopril and spironolactone at home, will restart as needed.   RENAL A:  Hyperkalemia-will recheck. 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 P:   Continue current management, GI following. --Continue protonix drip and octreotide.   HEMATOLOGIC A:  Blood loss anemia secondary to GI bleeding. P:  Continue to transfuse as needed.  INFECTIOUS - Micro/culture results:  BCx2 - UC - Sputum-  Antibiotics:   ENDOCRINE A:  -  NEUROLOGIC A:  -   MAJOR EVENTS/TEST RESULTS:   Best Practices  DVT Prophylaxis: LE stockings.  GI Prophylaxis: On protonix.    ---------------------------------------  ---------------------------------------   Name: Jeff Ward MRN: [id number] DOB: 1950/03/25    ADMISSION DATE:  07/23/2015 CONSULTATION DATE:  6/27  REFERRING MD :  Dr. Claria Dice  CHIEF COMPLAINT:  AMS.    HISTORY OF PRESENT ILLNESS:    This is a 65 year old gentleman alcoholic. The patient is currently intubated on the on the ventilator. Therefore, he cannot provide a history of presenting illness or review of systems, history was therefore obtained from the chart and from staff. He drinks whiskey daily. He also has a known history of liver cirrhosis with varices. This evening started vomiting copious amounts of blood and having  black tarry stools. He came to the ER.  He underwent an EGD, which showed bleeding, grade 3 esophageal varices, these were banded. He was intubated for the procedure, he was left intubated after the procedure.  PAST MEDICAL HISTORY :  Past Medical History  Diagnosis Date  . 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 Hyperlipidemia 06/2002  . Hypertension   . Arthritis     In back and all joints  . Depression   . Chronic headache   . GI bleed   . Kidney stones   . GERD (gastroesophageal reflux disease)   . Hiatal hernia   . Esophageal varices (HCC)    Past Surgical History  Procedure Laterality Date  . Carpal tunnel release  2005     / right hand  . Tonsillectomy and adenoidectomy      65 years old  . Vasectomy     Prior to Admission medications   Medication Sig Start Date End Date Taking? Authorizing Provider  diclofenac (VOLTAREN) 75 MG EC tablet Take 75 mg by mouth 2 (two) times daily as needed for mild pain.    Yes Historical Provider, MD  DULoxetine (CYMBALTA) 30 MG capsule Take 30 mg by mouth daily.   Yes Historical Provider, MD  EPINEPHrine (EPIPEN 2-PAK) 0.3 mg/0.3 mL IJ SOAJ injection Inject 0.3 mg into the muscle once as needed (for severe allergic reaction).   Yes Historical Provider, MD  fenofibrate (TRICOR) 145 MG tablet Take 145 mg by mouth daily.   Yes Historical Provider, MD  furosemide (LASIX) 10 MG/ML solution Take 2 mLs (20 mg total) by mouth daily. 06/18/15  Yes Ladene Artist, MD  glucosamine-chondroitin 500-400 MG tablet Take 1 tablet by mouth  2 (two) times daily.     Yes Historical Provider, MD  Iron-FA-B Cmp-C-Biot-Probiotic (FUSION PLUS) CAPS Take 1 capsule by mouth 2 (two) times daily with a meal. 06/13/15  Yes Ladene Artist, MD  lisinopril (PRINIVIL,ZESTRIL) 40 MG tablet Take 40 mg by mouth daily.   Yes Historical Provider, MD  meclizine (ANTIVERT) 25 MG tablet Take 1 tablet (25 mg total) by mouth daily as needed for dizziness or nausea.  10/25/14 10/25/15 Yes Ladene Artist, MD  meloxicam (MOBIC) 7.5 MG tablet Take 7.5 mg by mouth 2 (two) times daily.   Yes Historical Provider, MD  Multiple Vitamin (MULTIVITAMIN WITH MINERALS) TABS tablet Take 1 tablet by mouth daily.   Yes Historical Provider, MD  pantoprazole (PROTONIX) 40 MG tablet Take 1 tablet (40 mg total) by mouth daily. 06/13/15  Yes Ladene Artist, MD  Potassium 99 MG TABS Take 99 mg by mouth 2 (two) times daily.    Yes Historical Provider, MD  spironolactone (ALDACTONE) 50 MG tablet Take 2 tablets (100 mg total) by mouth daily. 06/18/15  Yes Ladene Artist, MD  tetrahydrozoline 0.05 % ophthalmic solution Place 1 drop into both eyes daily.   Yes Historical Provider, MD   Allergies  Allergen Reactions  . Bee Venom Anaphylaxis  . Sulfa Antibiotics Other (See Comments)    Reaction:  Fainting     FAMILY HISTORY:  Family History  Problem Relation Age of Onset  . Cancer Father     history of melanona  . Cancer Paternal Aunt     breast  . Alcohol abuse Maternal Grandfather    SOCIAL HISTORY:  reports that he quit smoking about 5 years ago. His smoking use included Cigars. He has never used smokeless tobacco. He reports that he drinks about 1.2 oz of alcohol per week. He reports that he does not use illicit drugs.  REVIEW OF SYSTEMS:   Could not obtain as the patient is currently intubated on the ventilator.   VITAL SIGNS: Temp:  [97.6 F (36.4 C)-98.2 F (36.8 C)] 98 F (36.7 C) (06/27 0700) Pulse Rate:  [63-104] 76 (06/27 0700) Resp:  [11-20] 12 (06/27 0700) BP: (101-206)/(47-96) 101/58 mmHg (06/27 0700) SpO2:  [98 %-100 %] 100 % (06/27 0817) FiO2 (%):  [40 %-50 %] 40 % (06/27 0817) Weight:  [175 lb (79.379 kg)] 175 lb (79.379 kg) (06/26 1942) HEMODYNAMICS:   VENTILATOR SETTINGS: Vent Mode:  [-] PRVC FiO2 (%):  [40 %-50 %] 40 % Set Rate:  [8 bmp-12 bmp] 12 bmp Vt Set:  [500 mL-600 mL] 500 mL PEEP:  [5 cmH20] 5 cmH20 Plateau Pressure:  [13 cmH20] 13  cmH20 INTAKE / OUTPUT:  Intake/Output Summary (Last 24 hours) at 07/24/15 0851 Last data filed at 07/24/15 0500  Gross per 24 hour  Intake   1492 ml  Output    420 ml  Net   1072 ml    Physical Examination:   VS: BP 101/58 mmHg  Pulse 76  Temp(Src) 98 F (36.7 C) (Oral)  Resp 12  Ht 5\' 5"  (1.651 m)  Wt 175 lb (79.379 kg)  BMI 29.12 kg/m2  SpO2 100%  General Appearance: No distress  Neuro:without focal findings, mental status, Reduced as the patient is on sedation. HEENT: PERRLA,no other lesions noticed;  Pulmonary: normal breath sounds., diaphragmatic excursion normal. CardiovascularNormal S1,S2.  No m/r/g.    Abdomen: Benign, Soft, non-tender, No masses, hepatosplenomegaly, No lymphadenopathy Renal:  No costovertebral tenderness  GU:  Not performed  at this time. Endoc: No evident thyromegaly, no signs of acromegaly. Skin:   warm, no rashes, no ecchymosis  Extremities: normal, no cyanosis, clubbing, no edema, warm with normal capillary refill.    LABS: Reviewed   LABORATORY PANEL:   CBC  Recent Labs Lab 07/23/15 1959  07/24/15 0518  WBC 8.6  --   --   HGB 8.3*  < > 7.7*  HCT 24.5*  < > 22.4*  PLT 176  --   --   < > = values in this interval not displayed.  Chemistries   Recent Labs Lab 07/23/15 1959 07/24/15 0518  NA 139 137  K 4.9 5.7*  CL 106 110  CO2 26 24  GLUCOSE 162* 169*  BUN 29* 38*  CREATININE 0.89 0.84  CALCIUM 9.4 8.2*  AST 44*  --   ALT 22  --   ALKPHOS 69  --   BILITOT 0.9  --      Recent Labs Lab 07/24/15 0146  GLUCAP 135*    Recent Labs Lab 07/24/15 0318  PHART 7.39  PCO2ART 38  PO2ART 220*    Recent Labs Lab 07/23/15 1959  AST 44*  ALT 22  ALKPHOS 69  BILITOT 0.9  ALBUMIN 3.5    Cardiac Enzymes No results for input(s): TROPONINI in the last 168 hours.  RADIOLOGY:  Dg Chest 1 View  07/24/2015  CLINICAL DATA:  Respiratory failure. EXAM: CHEST 1 VIEW COMPARISON:  None. FINDINGS: The endotracheal tube  is at the level of the clavicular heads. The lungs are grossly clear. No pneumothorax. No large effusion IMPRESSION: ET tube at the level of the clavicular heads. Electronically Signed   By: Andreas Newport M.D.   On: 07/24/2015 02:21       --Marda Stalker, MD.  Board Certified in Internal Medicine, Pulmonary Medicine, [place], and Sleep Medicine.  ICU Pager [phone] [facility] Office Number: [phone]  Patricia Pesa, M.D.  Vilinda Boehringer, M.D.  Merton Border, M.D   07/24/2015, 8:51 AM  [place].  I have personally obtained a history, examined the patient, evaluated laboratory and imaging results, formulated the assessment and plan and placed orders. The Patient requires high complexity decision making for assessment and support, frequent evaluation and titration of therapies, application of advanced monitoring technologies and extensive interpretation of multiple databases. The patient has critical illness that could lead imminently to failure of 1 or more organ systems and requires the highest level of physician preparedness to intervene.  Critical Care Time devoted to patient care services described in this note is 35 minutes and is exclusive of time spent in procedures.

## 2015-07-24 NOTE — Transfer of Care (Signed)
Immediate Anesthesia Transfer of Care Note  Patient: Jeff Ward  Procedure(s) Performed: Procedure(s) with comments: ESOPHAGOGASTRODUODENOSCOPY (EGD) (N/A) - Banding  Patient Location: PACU and ICU  Anesthesia Type:General  Level of Consciousness: sedated  Airway & Oxygen Therapy: Patient remains intubated per anesthesia plan and Patient placed on Ventilator (see vital sign flow sheet for setting)  Post-op Assessment: Report given to RN and Post -op Vital signs reviewed and stable  Post vital signs: Reviewed and stable  Last Vitals:  Filed Vitals:   07/24/15 0000 07/24/15 0001  BP: 106/47 106/47  Pulse: 63 63  Temp:  36.4 C  Resp: 15 16    Last Pain:  Filed Vitals:   07/24/15 0011  PainSc: 0-No pain         Complications: No apparent anesthesia complications

## 2015-07-24 NOTE — ED Notes (Signed)
Pts denies throwing up since phenergan

## 2015-07-25 ENCOUNTER — Encounter: Payer: Self-pay | Admitting: Gastroenterology

## 2015-07-25 LAB — BASIC METABOLIC PANEL
ANION GAP: 6 (ref 5–15)
BUN: 29 mg/dL — ABNORMAL HIGH (ref 6–20)
CHLORIDE: 106 mmol/L (ref 101–111)
CO2: 23 mmol/L (ref 22–32)
Calcium: 8 mg/dL — ABNORMAL LOW (ref 8.9–10.3)
Creatinine, Ser: 0.84 mg/dL (ref 0.61–1.24)
GFR calc Af Amer: >60 mL/min (ref >60)
Glucose, Bld: 140 mg/dL — ABNORMAL HIGH (ref 65–99)
POTASSIUM: 3.7 mmol/L (ref 3.5–5.1)
SODIUM: 135 mmol/L (ref 135–145)

## 2015-07-25 LAB — GLUCOSE, CAPILLARY
GLUCOSE-CAPILLARY: 137 mg/dL — AB (ref 65–99)
GLUCOSE-CAPILLARY: 132 mg/dL — AB (ref 65–99)
GLUCOSE-CAPILLARY: 132 mg/dL — AB (ref 65–99)
GLUCOSE-CAPILLARY: 125 mg/dL — AB (ref 65–99)
Glucose-Capillary: 122 mg/dL — ABNORMAL HIGH (ref 65–99)

## 2015-07-25 LAB — MAGNESIUM: Magnesium: 1.7 mg/dL (ref 1.7–2.4)

## 2015-07-25 LAB — HEMOGLOBIN AND HEMATOCRIT, BLOOD
HCT: 23 % — ABNORMAL LOW (ref 40.0–52.0)
HEMATOCRIT: 21.8 % — AB (ref 40.0–52.0)
HEMATOCRIT: 22.4 % — AB (ref 40.0–52.0)
HEMOGLOBIN: 7.6 g/dL — AB (ref 13.0–18.0)
Hemoglobin: 7.5 g/dL — ABNORMAL LOW (ref 13.0–18.0)
Hemoglobin: 7.8 g/dL — ABNORMAL LOW (ref 13.0–18.0)

## 2015-07-25 LAB — PREPARE RBC (CROSSMATCH)

## 2015-07-25 LAB — PHOSPHORUS: PHOSPHORUS: 2.4 mg/dL — AB (ref 2.5–4.6)

## 2015-07-25 MED ORDER — LORAZEPAM 2 MG/ML IJ SOLN
1.0000 mg | INTRAMUSCULAR | Status: DC | PRN
  Administered 2015-07-25 – 2015-07-26 (×5): 2 mg via INTRAVENOUS
  Filled 2015-07-25 (×5): qty 1

## 2015-07-25 MED ORDER — LABETALOL HCL 5 MG/ML IV SOLN
10.0000 mg | INTRAVENOUS | Status: DC | PRN
  Administered 2015-07-26: 10 mg via INTRAVENOUS
  Filled 2015-07-25 (×2): qty 4

## 2015-07-25 MED ORDER — MUPIROCIN 2 % EX OINT
1.0000 "application " | TOPICAL_OINTMENT | Freq: Two times a day (BID) | CUTANEOUS | Status: AC
  Administered 2015-07-25 – 2015-07-29 (×10): 1 via NASAL
  Filled 2015-07-25: qty 22

## 2015-07-25 MED ORDER — METOPROLOL TARTRATE 5 MG/5ML IV SOLN
2.5000 mg | INTRAVENOUS | Status: DC | PRN
  Administered 2015-07-25: 5 mg via INTRAVENOUS
  Administered 2015-07-25: 2.5 mg via INTRAVENOUS
  Filled 2015-07-25 (×2): qty 5

## 2015-07-25 MED ORDER — CHLORHEXIDINE GLUCONATE CLOTH 2 % EX PADS
6.0000 | MEDICATED_PAD | Freq: Every day | CUTANEOUS | Status: DC
  Administered 2015-07-27 – 2015-07-30 (×4): 6 via TOPICAL

## 2015-07-25 MED ORDER — LISINOPRIL 10 MG PO TABS
10.0000 mg | ORAL_TABLET | Freq: Every day | ORAL | Status: DC
  Administered 2015-07-25: 10 mg via ORAL
  Filled 2015-07-25: qty 1

## 2015-07-25 NOTE — Progress Notes (Signed)
Patient slept through most of the day. Towards the afternoon became agitated and ativan given. Some brown smears noted rectally but no active bleeding. Vitals have been stable, family updated throughout the day.

## 2015-07-25 NOTE — Progress Notes (Signed)
[facility]     ASSESSMENT/PLAN   65 year old male admitted with GI bleed due to esophageal varices, status post banding, known history of cirrhosis.  PULMONARY A: Acute respiratory failure, patient was intubated for respiratory protection, has extubated yesterday, now doing well, currently on room air, oxygen sat 97%   CARDIOVASCULAR A:Essential hypertension, on lisinopril and spironolactone at home.   [facility] consider restarting patient's home antihypertensive medications now that his blood pressure is back up.   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 P:  Continue current management, GI following. --Continue protonix drip and octreotide.   HEMATOLOGIC A: Blood loss anemia secondary to GI bleeding. P:  Monitor Hb.  Continue to transfuse as needed.  INFECTIOUS - Micro/culture results:  BCx2 - UC - Sputum-  Antibiotics:   ENDOCRINE A: -  NEUROLOGIC A: -   MAJOR EVENTS/TEST RESULTS:   Best Practices  DVT Prophylaxis: LE stockings.  GI Prophylaxis: On protonix.       ---------------------------------------   ----------------------------------------   Name: Jeff Ward MRN: [id number] DOB: Jun 13, 1950    ADMISSION DATE:  07/23/2015    CHIEF COMPLAINT:  No new complaints.      SUBJECTIVE:   Pt currently on the lethargic, can not provide history or review of systems.     VITAL SIGNS: Temp:  [98.1 F (36.7 C)-98.5 F (36.9 C)] 98.5 F (36.9 C) (06/28 0400) Pulse Rate:  [74-85] 79 (06/28 0700) Resp:  [10-17] 14 (06/28 0700) BP: (111-155)/(53-91) 155/85 mmHg (06/28 0700) SpO2:  [95 %-100 %] 97 % (06/28 0700) HEMODYNAMICS:   VENTILATOR SETTINGS:   INTAKE / OUTPUT:  Intake/Output Summary (Last 24 hours) at 07/25/15 0900 Last data filed at 07/25/15 0600  Gross per 24 hour  Intake 2554.25 ml  Output   2100 ml  Net 454.25 ml      PHYSICAL EXAMINATION: Physical Examination:   VS: BP 155/85 mmHg  Pulse 79  Temp(Src) 98.5 F (36.9 C) (Oral)  Resp 14  Ht 5\' 5"  (1.651 m)  Wt 175 lb (79.379 kg)  BMI 29.12 kg/m2  SpO2 97%  General Appearance: No distress  Neuro:without focal findings, mental status reduced but pt is arousable.  HEENT: PERRLA, EOM intact. Pulmonary: normal breath sounds   CardiovascularNormal S1,S2.  No m/r/g.   Abdomen: Benign, Soft, non-tender. Renal:  No costovertebral tenderness  GU:  Not performed at this time. Endocrine: No evident thyromegaly. Skin:   warm, no rashes, no ecchymosis  Extremities: normal, no cyanosis, clubbing.   LABS:   LABORATORY PANEL:   CBC  Recent Labs Lab 07/23/15 1959  07/25/15 0616  WBC 8.6  --   --   HGB 8.3*  < > 7.6*  HCT 24.5*  < > 22.4*  PLT 176  --   --   < > = values in this interval not displayed.  Chemistries   Recent Labs Lab 07/23/15 1959  07/25/15 0616  NA 139  < > 135  K 4.9  < > 3.7  CL 106  < > 106  CO2 26  < > 23  GLUCOSE 162*  < > 140*  BUN 29*  < > 29*  CREATININE 0.89  < > 0.84  CALCIUM 9.4  < > 8.0*  MG  --   --  1.7  PHOS  --   --  2.4*  AST 44*  --   --   ALT 22  --   --  ALKPHOS 69  --   --   BILITOT 0.9  --   --   < > = values in this interval not displayed.   Recent Labs Lab 07/24/15 1241 07/24/15 1624 07/24/15 1925 07/24/15 2333 07/25/15 0359 07/25/15 0726  GLUCAP 159* 122* 146* 122* 137* 132*    Recent Labs Lab 07/24/15 0318 07/24/15 1016  PHART 7.39 7.42  PCO2ART 38 39  PO2ART 220* 69*    Recent Labs Lab 07/23/15 1959  AST 44*  ALT 22  ALKPHOS 69  BILITOT 0.9  ALBUMIN 3.5    Cardiac Enzymes No results for input(s): TROPONINI in the last 168 hours.  RADIOLOGY:  Dg Chest 1 View  07/24/2015  CLINICAL DATA:  Respiratory failure. EXAM: CHEST 1 VIEW COMPARISON:  None. FINDINGS: The endotracheal tube is at the level of the clavicular heads. The lungs are grossly clear. No  pneumothorax. No large effusion IMPRESSION: ET tube at the level of the clavicular heads. Electronically Signed   By: Andreas Newport M.D.   On: 07/24/2015 02:21       --Marda Stalker, MD.  ICU Pager: [phone] [facility] Office Number: [phone]  Patricia Pesa, M.D.  Vilinda Boehringer, M.D.  Merton Border, M.D  07/25/2015  Critical Care Attestation.  I have personally obtained a history, examined the patient, evaluated laboratory and imaging results, formulated the assessment and plan and placed orders. The Patient requires high complexity decision making for assessment and support, frequent evaluation and titration of therapies, application of advanced monitoring technologies and extensive interpretation of multiple databases. The patient has critical illness that could lead imminently to failure of 1 or more organ systems and requires the highest level of physician preparedness to intervene.  Critical Care Time devoted to patient care services described in this note is 35 minutes and is exclusive of time spent in procedures.

## 2015-07-25 NOTE — Progress Notes (Signed)
[place] [facility] [place] NAME: Jeff Ward    MR#:  [id number]  DATE OF BIRTH:  Jun 05, 1950  SUBJECTIVE:  CHIEF COMPLAINT:   Chief Complaint  Patient presents with  . Hematemesis  . Rectal Bleeding     Came in with black stools and vomiting blood.   Transfused overnight and was intubated for airway protection after esophageal or ischial banding was done urgently by GI. Hemoglobin stable and patient is extubated . No more bleeding. Ammonia high, remains confused and drowsy.  REVIEW OF SYSTEMS:   Patient is drowsy and not able to give a review of system.  ROS  DRUG ALLERGIES:   Allergies  Allergen Reactions  . Bee Venom Anaphylaxis  . Sulfa Antibiotics Other (See Comments)    Reaction:  Fainting     VITALS:  Blood pressure 193/107, pulse 82, temperature 98.2 F (36.8 C), temperature source Oral, resp. rate 22, height 5\' 5"  (1.651 m), weight 79.379 kg (175 lb), SpO2 95 %.  PHYSICAL EXAMINATION:  GENERAL:  65 y.o.-year-old patient lying in the bed with no acute distress. Drowsy EYES: Pupils equal, round, reactive to light and accommodation. No scleral icterus.  Conjunctiva pale. HEENT: Head atraumatic, normocephalic. Oropharynx and nasopharynx clear.  NECK:  Supple, no jugular venous distention. No thyroid enlargement, no tenderness.  LUNGS: Normal breath sounds bilaterally, no wheezing, rales,rhonchi or crepitation. No use of accessory muscles of respiration.  CARDIOVASCULAR: S1, S2 normal. No murmurs, rubs, or gallops.  ABDOMEN: Soft, nontender, nondistended. Bowel sounds present. No organomegaly or mass. I saw his bowel movement, which was having dark color and mostly there are blood clots and not much stool material. EXTREMITIES: No pedal edema, cyanosis, or clubbing.  NEUROLOGIC: Patient is drowsy and very less response to stimuli.  PSYCHIATRIC: The patient is drowsy. SKIN: No obvious rash, lesion, or ulcer.   Physical  Exam LABORATORY PANEL:   CBC  Recent Labs Lab 07/23/15 1959  07/25/15 1716  WBC 8.6  --   --   HGB 8.3*  < > 7.8*  HCT 24.5*  < > 23.0*  PLT 176  --   --   < > = values in this interval not displayed. ------------------------------------------------------------------------------------------------------------------  Chemistries   Recent Labs Lab 07/23/15 1959  07/25/15 0616  NA 139  < > 135  K 4.9  < > 3.7  CL 106  < > 106  CO2 26  < > 23  GLUCOSE 162*  < > 140*  BUN 29*  < > 29*  CREATININE 0.89  < > 0.84  CALCIUM 9.4  < > 8.0*  MG  --   --  1.7  AST 44*  --   --   ALT 22  --   --   ALKPHOS 69  --   --   BILITOT 0.9  --   --   < > = values in this interval not displayed. ------------------------------------------------------------------------------------------------------------------  Cardiac Enzymes No results for input(s): TROPONINI in the last 168 hours. ------------------------------------------------------------------------------------------------------------------  RADIOLOGY:  Dg Chest 1 View  07/24/2015  CLINICAL DATA:  Respiratory failure. EXAM: CHEST 1 VIEW COMPARISON:  None. FINDINGS: The endotracheal tube is at the level of the clavicular heads. The lungs are grossly clear. No pneumothorax. No large effusion IMPRESSION: ET tube at the level of the clavicular heads. Electronically Signed   By: Andreas Newport M.D.   On: 07/24/2015 02:21    ASSESSMENT AND PLAN:   Principal Problem:  UGIB (upper gastrointestinal bleed) Active Problems:   HLD (hyperlipidemia)   Essential hypertension, benign   Alcoholic cirrhosis (HCC)   GI bleed   Hematemesis with nausea   Secondary esophageal varices with bleeding ([place])  . UGIB (upper gastrointestinal bleed) vericeal bleed . Alcoholic cirrhosis ([place]) -Admitted to ICU, intubated for airway protection and extubated -Protonix drip, octreotide  Drip- stopped now as no more bleed - and GI did banding. -Serial H&H  , received blood transfusion on admission night. - Liquid diet by mouth. INR slightly high. - stat transfusion of 2 packed red blood cells given in ER.  - Esophageal vericeal banding was done urgently overnight by GI. - Now continue monitoring, hemoglobin appears stable.  * Hepatic encephalopathy   Ammonia is elevated, continue lactulose.   Pt is still drowsy   Prophylaxis for SBP.  Alcohol abuse -CIWA protocol, not much signs of withdrawal.  . HLD (hyperlipidemia) -Stable, home medications held  . Essential hypertension, benign -Currently stable, home medications held  * Hyperkalemia   Now came down.    All the records are reviewed and case discussed with Care Management/Social Workerr. Management plans discussed with the patient, family and they are in agreement.  CODE STATUS: Full  TOTAL TIME TAKING CARE OF THIS PATIENT: 35 min.  POSSIBLE D/C IN 2-3 DAYS, DEPENDING ON CLINICAL CONDITION.   Vaughan Basta M.D on 07/25/2015   Between 7am to 6pm - Pager - [phone]  After 6pm go to [email or web address] - password [email or web address] [email or web address]  Office  [phone]  CC: Primary care physician; Arnette Norris, MD  Note: This dictation was prepared with Dragon dictation along with smaller phrase technology. Any transcriptional errors that result from this process are unintentional.

## 2015-07-25 NOTE — Progress Notes (Signed)
Patient having increased agitation, notified [place] PA- orders to give Ativan.

## 2015-07-26 ENCOUNTER — Inpatient Hospital Stay: Payer: Medicare Other

## 2015-07-26 DIAGNOSIS — K7290 Hepatic failure, unspecified without coma: Secondary | ICD-10-CM

## 2015-07-26 DIAGNOSIS — F10231 Alcohol dependence with withdrawal delirium: Secondary | ICD-10-CM

## 2015-07-26 LAB — BASIC METABOLIC PANEL
Anion gap: 6 (ref 5–15)
BUN: 20 mg/dL (ref 6–20)
CHLORIDE: 109 mmol/L (ref 101–111)
CO2: 23 mmol/L (ref 22–32)
CREATININE: 0.85 mg/dL (ref 0.61–1.24)
Calcium: 8.7 mg/dL — ABNORMAL LOW (ref 8.9–10.3)
GFR calc non Af Amer: >60 mL/min (ref >60)
Glucose, Bld: 130 mg/dL — ABNORMAL HIGH (ref 65–99)
Potassium: 3.6 mmol/L (ref 3.5–5.1)
Sodium: 138 mmol/L (ref 135–145)

## 2015-07-26 LAB — TYPE AND SCREEN
ABO/RH(D): A POS
ANTIBODY SCREEN: NEGATIVE
UNIT DIVISION: 0
Unit division: 0
Unit division: 0
Unit division: 0

## 2015-07-26 LAB — GLUCOSE, CAPILLARY
GLUCOSE-CAPILLARY: 114 mg/dL — AB (ref 65–99)
GLUCOSE-CAPILLARY: 115 mg/dL — AB (ref 65–99)
Glucose-Capillary: 105 mg/dL — ABNORMAL HIGH (ref 65–99)
Glucose-Capillary: 116 mg/dL — ABNORMAL HIGH (ref 65–99)
Glucose-Capillary: 119 mg/dL — ABNORMAL HIGH (ref 65–99)
Glucose-Capillary: 120 mg/dL — ABNORMAL HIGH (ref 65–99)
Glucose-Capillary: 149 mg/dL — ABNORMAL HIGH (ref 65–99)

## 2015-07-26 LAB — CBC
HEMATOCRIT: 24.1 % — AB (ref 40.0–52.0)
HEMOGLOBIN: 8.3 g/dL — AB (ref 13.0–18.0)
MCH: 31.3 pg (ref 26.0–34.0)
MCHC: 34.4 g/dL (ref 32.0–36.0)
MCV: 91 fL (ref 80.0–100.0)
Platelets: 165 10*3/uL (ref 150–440)
RBC: 2.65 MIL/uL — ABNORMAL LOW (ref 4.40–5.90)
RDW: 18.4 % — ABNORMAL HIGH (ref 11.5–14.5)
WBC: 7.2 10*3/uL (ref 3.8–10.6)

## 2015-07-26 LAB — PHOSPHORUS: Phosphorus: 2.7 mg/dL (ref 2.5–4.6)

## 2015-07-26 LAB — MAGNESIUM: Magnesium: 1.8 mg/dL (ref 1.7–2.4)

## 2015-07-26 MED ORDER — POTASSIUM CHLORIDE 10 MEQ/100ML IV SOLN
10.0000 meq | INTRAVENOUS | Status: AC
  Administered 2015-07-26 (×2): 10 meq via INTRAVENOUS
  Filled 2015-07-26 (×2): qty 100

## 2015-07-26 MED ORDER — RIFAXIMIN 550 MG PO TABS
550.0000 mg | ORAL_TABLET | Freq: Three times a day (TID) | ORAL | Status: DC
  Administered 2015-07-26 – 2015-07-30 (×9): 550 mg via ORAL
  Filled 2015-07-26 (×9): qty 1

## 2015-07-26 MED ORDER — VITAMIN B-1 100 MG PO TABS
100.0000 mg | ORAL_TABLET | Freq: Two times a day (BID) | ORAL | Status: DC
  Administered 2015-07-27 – 2015-07-29 (×4): 100 mg via ORAL
  Filled 2015-07-26 (×7): qty 1

## 2015-07-26 MED ORDER — DEXMEDETOMIDINE HCL IN NACL 400 MCG/100ML IV SOLN
0.2000 ug/kg/h | INTRAVENOUS | Status: AC
Start: 2015-07-26 — End: 2015-07-27
  Administered 2015-07-26: 0.2 ug/kg/h via INTRAVENOUS
  Administered 2015-07-27 (×2): 0.3 ug/kg/h via INTRAVENOUS
  Filled 2015-07-26 (×3): qty 100

## 2015-07-26 MED ORDER — MAGNESIUM SULFATE 2 GM/50ML IV SOLN
2.0000 g | Freq: Once | INTRAVENOUS | Status: AC
  Administered 2015-07-26: 2 g via INTRAVENOUS
  Filled 2015-07-26: qty 50

## 2015-07-26 MED ORDER — ENALAPRILAT 1.25 MG/ML IV SOLN
0.6250 mg | Freq: Four times a day (QID) | INTRAVENOUS | Status: DC
  Administered 2015-07-26 – 2015-07-29 (×7): 0.625 mg via INTRAVENOUS
  Filled 2015-07-26: qty 2
  Filled 2015-07-26: qty 0.5
  Filled 2015-07-26 (×2): qty 2
  Filled 2015-07-26: qty 0.5
  Filled 2015-07-26 (×3): qty 2

## 2015-07-26 MED ORDER — LACTULOSE 10 GM/15ML PO SOLN
30.0000 g | Freq: Three times a day (TID) | ORAL | Status: DC
  Administered 2015-07-26: 30 g via ORAL
  Filled 2015-07-26: qty 60

## 2015-07-26 MED ORDER — THIAMINE HCL 100 MG/ML IJ SOLN
100.0000 mg | Freq: Two times a day (BID) | INTRAMUSCULAR | Status: DC
  Administered 2015-07-26 – 2015-07-30 (×5): 100 mg via INTRAVENOUS
  Filled 2015-07-26 (×6): qty 2

## 2015-07-26 NOTE — Progress Notes (Signed)
Lucilla Lame, MD [facility]   [address], [place], [place] [address] [address] Phone: [phone] Fax : [phone]   Subjective: The patient has been extubated and has no sign of any further bleeding.  The patient was combative but is now just somnolent.  He is being treated for hepatic encephalopathy.   Objective: Vital signs in last 24 hours: Filed Vitals:   07/26/15 1300 07/26/15 1400 07/26/15 1500 07/26/15 1800  BP: 179/90 162/93 178/87 188/86  Pulse: 71 75 75 90  Temp:   98.3 F (36.8 C)   TempSrc:   Oral   Resp: 16 17 21 20   Height:      Weight:      SpO2: 99% 98% 100% 100%   Weight change:   Intake/Output Summary (Last 24 hours) at 07/26/15 1920 Last data filed at 07/26/15 1800  Gross per 24 hour  Intake   1785 ml  Output   3170 ml  Net  -1385 ml     Exam: Heart:: Regular rate and rhythm Lungs: normal Abdomen: soft, nontender, normal bowel sounds   Lab Results: @LABTEST2 @ Micro Results: Recent Results (from the past 240 hour(s))  MRSA PCR Screening     Status: Abnormal   Collection Time: 07/24/15  2:29 AM  Result Value Ref Range Status   MRSA by PCR POSITIVE (A) NEGATIVE Final    Comment:        The GeneXpert MRSA Assay (FDA approved for NASAL specimens only), is one component of a comprehensive MRSA colonization surveillance program. It is not intended to diagnose MRSA infection nor to guide or monitor treatment for MRSA infections. CRITICAL RESULT CALLED TO, READ BACK BY AND VERIFIED WITH: CALLED TESS THOMAS AT 0520 ON 07/24/15 RWW    Studies/Results: Ct Head Wo Contrast  07/26/2015  CLINICAL DATA:  65 year old male right altered mental status and unresponsiveness EXAM: CT HEAD WITHOUT CONTRAST TECHNIQUE: Contiguous axial images were obtained from the base of the skull through the vertex without intravenous contrast. COMPARISON:  Head CT dated 08/26/2014 FINDINGS: The ventricles and the sulci are appropriate in size for the patient's age. There  is no intracranial hemorrhage. No midline shift or mass effect identified. The gray-white matter differentiation is preserved. Right maxillary sinus retention cyst or polyp. The visualized paranasal sinuses and mastoid air cells are otherwise well aerated. The calvarium is intact. A nasogastric tube is partially visualized. IMPRESSION: No acute intracranial pathology. Electronically Signed   By: Anner Crete M.D.   On: 07/26/2015 02:42   Dg Abd Portable 1v  07/26/2015  CLINICAL DATA:  NG tube placement. EXAM: PORTABLE ABDOMEN - 1 VIEW COMPARISON:  07/26/2015 FINDINGS: Enteric catheter projects over gastric body. The bowel gas pattern is nonobstructive. No radio-opaque calculi or other significant radiographic abnormality are seen. IMPRESSION: Enteric catheter projects over the gastric body. Electronically Signed   By: Fidela Salisbury M.D.   On: 07/26/2015 13:57   Dg Abd Portable 1v  07/26/2015  CLINICAL DATA:  NG tube placement. EXAM: PORTABLE ABDOMEN - 1 VIEW COMPARISON:  CT of the abdomen pelvis 09/20/2014. FINDINGS: Enteric catheters not seen on this limited abdominal radiograph. There is borderline gaseous distention of small bowel loops in the central and left abdomen, suggestive of ileus. IMPRESSION: No enteric catheter is seen. Borderline gaseous distention of small bowel loops, suggestive of potential ileus. Electronically Signed   By: Fidela Salisbury M.D.   On: 07/26/2015 12:40   Medications: I have reviewed the patient's current medications. Scheduled Meds: .  cefTRIAXone (ROCEPHIN)  IV  1 g Intravenous Q24H  . Chlorhexidine Gluconate Cloth  6 each Topical Q0600  . enalaprilat  0.625 mg Intravenous 99991111  . folic acid  1 mg Oral Daily  . insulin aspart  2-6 Units Subcutaneous Q4H  . lactulose  30 g Oral TID  . multivitamin with minerals  1 tablet Oral Daily  . mupirocin ointment  1 application Nasal BID  . [START ON 07/27/2015] pantoprazole  40 mg Intravenous Q12H  . rifaximin   550 mg Oral TID  . sodium chloride flush  3 mL Intravenous Q12H  . thiamine  100 mg Intravenous BID   Or  . thiamine  100 mg Oral BID   Continuous Infusions: . dextrose 5 % and 0.45% NaCl 75 mL/hr at 07/26/15 1739   PRN Meds:.acetaminophen **OR** acetaminophen, labetalol, LORazepam, ondansetron **OR** ondansetron (ZOFRAN) IV, ondansetron (ZOFRAN) IV   Assessment: Principal Problem:   UGIB (upper gastrointestinal bleed) Active Problems:   HLD (hyperlipidemia)   Essential hypertension, benign   Alcoholic cirrhosis (HCC)   GI bleed   Hematemesis with nausea   Secondary esophageal varices with bleeding ([place])    Plan: This patient is status post massive esophageal variceal bleed.  The patient had bands placed on his esophagus to stop the bleeding.  The patient has been confused which is likely due to his advanced liver disease.  The patient has no signs of any further bleeding.  Would continue supportive therapy.   LOS: 3 days   Lucilla Lame 07/26/2015, 7:20 PM

## 2015-07-26 NOTE — Progress Notes (Signed)
Nasal trumpet inserted per order/request of Dr Marcille Blanco to help facilitate patent airway for this patient. Patient with snoring respirations. Patient's vitals stable throughout procedure. Tolerated well. Patient transported to CT via nursing staff. Dr Marcille Blanco at bedside.

## 2015-07-26 NOTE — Progress Notes (Signed)
[facility] NAME: Jeff Ward    MR#:  [id number]  DATE OF BIRTH:  06-17-1950  SUBJECTIVE:  CHIEF COMPLAINT:   Chief Complaint  Patient presents with  . Hematemesis  . Rectal Bleeding     Came in with black stools and vomiting blood.   Transfused overnight and was intubated for airway protection after esophageal or ischial banding was done urgently by GI. Hemoglobin stable and patient is extubated . No more bleeding. Ammonia high, remains confused and drowsy.  REVIEW OF SYSTEMS:   Patient is drowsy and not able to give a review of system.  ROS  DRUG ALLERGIES:   Allergies  Allergen Reactions  . Bee Venom Anaphylaxis  . Sulfa Antibiotics Other (See Comments)    Reaction:  Fainting     VITALS:  Blood pressure 162/68, pulse 105, temperature 98.3 F (36.8 C), temperature source Oral, resp. rate 21, height 5\' 5"  (1.651 m), weight 79.379 kg (175 lb), SpO2 100 %.  PHYSICAL EXAMINATION:  GENERAL:  65 y.o.-year-old patient lying in the bed with no acute distress. Drowsy EYES: Pupils equal, round, reactive to light and accommodation. No scleral icterus.  Conjunctiva pale. HEENT: Head atraumatic, normocephalic. Oropharynx and nasopharynx clear.  NECK:  Supple, no jugular venous distention. No thyroid enlargement, no tenderness. NGT in place LUNGS: Normal breath sounds bilaterally, no wheezing, rales,rhonchi or crepitation. No use of accessory muscles of respiration.  CARDIOVASCULAR: S1, S2 normal. No murmurs, rubs, or gallops.  ABDOMEN: Soft, nontender, nondistended. Bowel sounds present. No organomegaly or mass. I saw his bowel movement, which was having dark color and mostly there are blood clots and not much stool material. EXTREMITIES: No pedal edema, cyanosis, or clubbing.  NEUROLOGIC: Patient is drowsy and very less response to stimuli.  PSYCHIATRIC: The patient is drowsy. SKIN: No obvious rash, lesion, or ulcer.   Physical  Exam LABORATORY PANEL:   CBC  Recent Labs Lab 07/26/15 0435  WBC 7.2  HGB 8.3*  HCT 24.1*  PLT 165   ------------------------------------------------------------------------------------------------------------------  Chemistries   Recent Labs Lab 07/23/15 1959  07/26/15 0435  NA 139  < > 138  K 4.9  < > 3.6  CL 106  < > 109  CO2 26  < > 23  GLUCOSE 162*  < > 130*  BUN 29*  < > 20  CREATININE 0.89  < > 0.85  CALCIUM 9.4  < > 8.7*  MG  --   < > 1.8  AST 44*  --   --   ALT 22  --   --   ALKPHOS 69  --   --   BILITOT 0.9  --   --   < > = values in this interval not displayed. ------------------------------------------------------------------------------------------------------------------  Cardiac Enzymes No results for input(s): TROPONINI in the last 168 hours. ------------------------------------------------------------------------------------------------------------------  RADIOLOGY:  Ct Head Wo Contrast  07/26/2015  CLINICAL DATA:  65 year old male right altered mental status and unresponsiveness EXAM: CT HEAD WITHOUT CONTRAST TECHNIQUE: Contiguous axial images were obtained from the base of the skull through the vertex without intravenous contrast. COMPARISON:  Head CT dated 08/26/2014 FINDINGS: The ventricles and the sulci are appropriate in size for the patient's age. There is no intracranial hemorrhage. No midline shift or mass effect identified. The gray-white matter differentiation is preserved. Right maxillary sinus retention cyst or polyp. The visualized paranasal sinuses and mastoid air cells are otherwise well aerated. The calvarium is intact. A nasogastric  tube is partially visualized. IMPRESSION: No acute intracranial pathology. Electronically Signed   By: Anner Crete M.D.   On: 07/26/2015 02:42   Dg Abd Portable 1v  07/26/2015  CLINICAL DATA:  NG tube placement. EXAM: PORTABLE ABDOMEN - 1 VIEW COMPARISON:  07/26/2015 FINDINGS: Enteric catheter projects  over gastric body. The bowel gas pattern is nonobstructive. No radio-opaque calculi or other significant radiographic abnormality are seen. IMPRESSION: Enteric catheter projects over the gastric body. Electronically Signed   By: Fidela Salisbury M.D.   On: 07/26/2015 13:57   Dg Abd Portable 1v  07/26/2015  CLINICAL DATA:  NG tube placement. EXAM: PORTABLE ABDOMEN - 1 VIEW COMPARISON:  CT of the abdomen pelvis 09/20/2014. FINDINGS: Enteric catheters not seen on this limited abdominal radiograph. There is borderline gaseous distention of small bowel loops in the central and left abdomen, suggestive of ileus. IMPRESSION: No enteric catheter is seen. Borderline gaseous distention of small bowel loops, suggestive of potential ileus. Electronically Signed   By: Fidela Salisbury M.D.   On: 07/26/2015 12:40    ASSESSMENT AND PLAN:   Principal Problem:   UGIB (upper gastrointestinal bleed) Active Problems:   HLD (hyperlipidemia)   Essential hypertension, benign   Alcoholic cirrhosis (HCC)   GI bleed   Hematemesis with nausea   Secondary esophageal varices with bleeding ([place])  . UGIB (upper gastrointestinal bleed) vericeal bleed . Alcoholic cirrhosis ([place]) -Admitted to ICU, intubated for airway protection and extubated -Protonix drip, octreotide  Drip- stopped now as no more bleed - and GI did banding. -Serial H&H , received blood transfusion on admission night. - Liquid diet by mouth, when he is awake. INR slightly high. - stat transfusion of 2 packed red blood cells given in ER.  - Esophageal vericeal banding was done urgently overnight by GI. - Now continue monitoring, hemoglobin appears stable.  * Hepatic encephalopathy   Ammonia is elevated, continue lactulose.   Pt is still drowsy   Prophylaxis for SBP.   NGT placed to give lactulose.   Added xifaxan, if he can take.  Alcohol abuse -CIWA protocol, not much signs of withdrawal.  . HLD (hyperlipidemia) -Stable, home medications  held  . Essential hypertension, benign -Currently stable, home medications held  * Hyperkalemia   Now came down.    All the records are reviewed and case discussed with Care Management/Social Workerr. Management plans discussed with the patient, family and they are in agreement.  CODE STATUS: Full  TOTAL TIME TAKING CARE OF THIS PATIENT: 35 min.  POSSIBLE D/C IN 2-3 DAYS, DEPENDING ON CLINICAL CONDITION.   Vaughan Basta M.D on 07/26/2015   Between 7am to 6pm - Pager - [phone]  After 6pm go to [email or web address] - password [email or web address] [facility]  Office  [phone]  CC: Primary care physician; Arnette Norris, MD  Note: This dictation was prepared with Dragon dictation along with smaller phrase technology. Any transcriptional errors that result from this process are unintentional.

## 2015-07-26 NOTE — Progress Notes (Signed)
[facility] ADULT ICU REPLACEMENT PROTOCOL FOR AM LAB REPLACEMENT ONLY  The patient does apply for the [facility] Adult ICU Electrolyte Replacment Protocol based on the criteria listed below:   1. Is GFR >/= 40 ml/min? Yes.    Patient's GFR today is >60 2. Is urine output >/= 0.5 ml/kg/hr for the last 6 hours? Yes.   Patient's UOP is 3.87ml/kg/hr 3. Is BUN < 60 mg/dL? Yes.    Patient's BUN today is 20 4. Abnormal electrolyte(s):K3.6,Mg1.8 5. Ordered repletion with: per protocol 6. If a panic level lab has been reported, has the CCM MD in charge been notified? Yes.  .   Physician:  Elder Cyphers, MD  [facility] 07/26/2015 6:11 AM

## 2015-07-26 NOTE — Progress Notes (Signed)
[place] [facility] Progess Note    ASSESSMENT/PLAN   65 year old male admitted with GI bleed due to esophageal varices, status post banding, known history of cirrhosis. Now complicated by hepatic encephalopathy and etoh withdrawal.   PULMONARY A: Acute respiratory failure, patient was intubated for respiratory protection, has extubated 6/27, now doing well, currently on room air, oxygen sat 100%   CARDIOVASCULAR A:Essential hypertension, on lisinopril and spironolactone at home.  Elevated BP may also be due to withdrawal.   -We'll change ACE inhibitor to IV form   RENAL A: -- P:  --  GASTROINTESTINAL A: Cirrhosis with portal hypertension and esophageal varices. P:  Continue current management, GI following. --Can DC drips soon.   HEMATOLOGIC A: Blood loss anemia secondary to GI bleeding. P:  Monitor Hb.  Continue to transfuse as needed.  INFECTIOUS - Micro/culture results:  MRSA screening PCR 6/27: Positive BCx2 - UC - Sputum-  Antibiotics: Ceftriaxone 6/27>>  ENDOCRINE A: - glucose controlled, continue to monitor with SSI.   NEUROLOGIC A:  Hepatic encephalopathy with hyperammonemia. P: -Will increase lactulose to 3 times a day. If unable to take orally. We will administer as an above. -Continue thiamine and vitamin replacement   MAJOR EVENTS/TEST RESULTS:   Best Practices  DVT Prophylaxis: LE stockings.  GI Prophylaxis: On protonix.   ---------------------------------------   ----------------------------------------   Name: Jeff Ward MRN: [id number] DOB: [birth year]    ADMISSION DATE:  07/23/2015    SUBJECTIVE:   Pt currently on the obtunded, can not provide history or review of systems.     VITAL SIGNS: Temp:  [98 F (36.7 C)-98.8 F (37.1 C)] 98.8 F (37.1 C) (06/29 0700) Pulse Rate:  [67-103] 82 (06/29 0800) Resp:  [12-29] 19 (06/29 0800) BP: (115-204)/(56-107) 191/83 mmHg (06/29  0800) SpO2:  [95 %-100 %] 100 % (06/29 0800) HEMODYNAMICS:   VENTILATOR SETTINGS:   INTAKE / OUTPUT:  Intake/Output Summary (Last 24 hours) at 07/26/15 0823 Last data filed at 07/26/15 0800  Gross per 24 hour  Intake    825 ml  Output   2670 ml  Net  -1845 ml    PHYSICAL EXAMINATION: Physical Examination:   VS: BP 191/83 mmHg  Pulse 82  Temp(Src) 98.8 F (37.1 C) (Axillary)  Resp 19  Ht 5\' 5"  (1.651 m)  Wt 175 lb (79.379 kg)  BMI 29.12 kg/m2  SpO2 100%  General Appearance: No distress  Neuro:without focal findings, mental status normal. HEENT: PERRLA, EOM intact. Pulmonary: normal breath sounds   CardiovascularNormal S1,S2.  No m/r/g.   Abdomen: Benign, Soft, non-tender. Renal:  No costovertebral tenderness  GU:  Not performed at this time. Endocrine: No evident thyromegaly. Skin:   warm, no rashes, no ecchymosis  Extremities: normal, no cyanosis, clubbing.   LABS:   LABORATORY PANEL:   CBC  Recent Labs Lab 07/26/15 0435  WBC 7.2  HGB 8.3*  HCT 24.1*  PLT 165    Chemistries   Recent Labs Lab 07/23/15 1959  07/26/15 0435  NA 139  < > 138  K 4.9  < > 3.6  CL 106  < > 109  CO2 26  < > 23  GLUCOSE 162*  < > 130*  BUN 29*  < > 20  CREATININE 0.89  < > 0.85  CALCIUM 9.4  < > 8.7*  MG  --   < > 1.8  PHOS  --   < > 2.7  AST 44*  --   --  ALT 22  --   --   ALKPHOS 69  --   --   BILITOT 0.9  --   --   < > = values in this interval not displayed.   Recent Labs Lab 07/25/15 1143 07/25/15 1600 07/25/15 1951 07/26/15 0028 07/26/15 0408 07/26/15 0738  GLUCAP 132* 125* 122* 149* 119* 120*    Recent Labs Lab 07/24/15 0318 07/24/15 1016  PHART 7.39 7.42  PCO2ART 38 39  PO2ART 220* 69*    Recent Labs Lab 07/23/15 1959  AST 44*  ALT 22  ALKPHOS 69  BILITOT 0.9  ALBUMIN 3.5    Cardiac Enzymes No results for input(s): TROPONINI in the last 168 hours.  RADIOLOGY:  Ct Head Wo Contrast  07/26/2015  CLINICAL DATA:  65 year old  male right altered mental status and unresponsiveness EXAM: CT HEAD WITHOUT CONTRAST TECHNIQUE: Contiguous axial images were obtained from the base of the skull through the vertex without intravenous contrast. COMPARISON:  Head CT dated 08/26/2014 FINDINGS: The ventricles and the sulci are appropriate in size for the patient's age. There is no intracranial hemorrhage. No midline shift or mass effect identified. The gray-white matter differentiation is preserved. Right maxillary sinus retention cyst or polyp. The visualized paranasal sinuses and mastoid air cells are otherwise well aerated. The calvarium is intact. A nasogastric tube is partially visualized. IMPRESSION: No acute intracranial pathology. Electronically Signed   By: Anner Crete M.D.   On: 07/26/2015 02:42       --Marda Stalker, MD.  ICU Pager: [phone] [facility] Office Number: [phone]  Patricia Pesa, M.D.  Vilinda Boehringer, M.D.  Merton Border, M.D  07/26/2015   Critical Care Attestation.  I have personally obtained a history, examined the patient, evaluated laboratory and imaging results, formulated the assessment and plan and placed orders. The Patient requires high complexity decision making for assessment and support, frequent evaluation and titration of therapies, application of advanced monitoring technologies and extensive interpretation of multiple databases. The patient has critical illness that could lead imminently to failure of 1 or more organ systems and requires the highest level of physician preparedness to intervene.  Critical Care Time devoted to patient care services described in this note is 40 minutes and is exclusive of time spent in procedures.

## 2015-07-26 NOTE — Progress Notes (Signed)
Acute changes noted in mental status. MD paged. Dr Marcille Blanco come and see the pt. I have already ordered this as a future order in the computer. inj ativan 2mg  and head CT.VSS.note in any  Distress. Will continue to observe closely.

## 2015-07-27 LAB — BASIC METABOLIC PANEL
ANION GAP: 6 (ref 5–15)
BUN: 19 mg/dL (ref 6–20)
CO2: 21 mmol/L — AB (ref 22–32)
Calcium: 8.1 mg/dL — ABNORMAL LOW (ref 8.9–10.3)
Chloride: 110 mmol/L (ref 101–111)
Creatinine, Ser: 0.72 mg/dL (ref 0.61–1.24)
GFR calc non Af Amer: >60 mL/min (ref >60)
GLUCOSE: 112 mg/dL — AB (ref 65–99)
POTASSIUM: 3.5 mmol/L (ref 3.5–5.1)
Sodium: 137 mmol/L (ref 135–145)

## 2015-07-27 LAB — AMMONIA: AMMONIA: 94 umol/L — AB (ref 9–35)

## 2015-07-27 LAB — GLUCOSE, CAPILLARY
GLUCOSE-CAPILLARY: 107 mg/dL — AB (ref 65–99)
GLUCOSE-CAPILLARY: 110 mg/dL — AB (ref 65–99)
GLUCOSE-CAPILLARY: 111 mg/dL — AB (ref 65–99)
GLUCOSE-CAPILLARY: 123 mg/dL — AB (ref 65–99)
GLUCOSE-CAPILLARY: 82 mg/dL (ref 65–99)
GLUCOSE-CAPILLARY: 94 mg/dL (ref 65–99)

## 2015-07-27 LAB — COMPREHENSIVE METABOLIC PANEL
ALK PHOS: 57 U/L (ref 38–126)
ALT: 22 U/L (ref 17–63)
AST: 46 U/L — ABNORMAL HIGH (ref 15–41)
Albumin: 2.9 g/dL — ABNORMAL LOW (ref 3.5–5.0)
Anion gap: 5 (ref 5–15)
BILIRUBIN TOTAL: 1.2 mg/dL (ref 0.3–1.2)
BUN: 16 mg/dL (ref 6–20)
CALCIUM: 8 mg/dL — AB (ref 8.9–10.3)
CHLORIDE: 111 mmol/L (ref 101–111)
CO2: 21 mmol/L — ABNORMAL LOW (ref 22–32)
CREATININE: 0.66 mg/dL (ref 0.61–1.24)
Glucose, Bld: 124 mg/dL — ABNORMAL HIGH (ref 65–99)
Potassium: 3.3 mmol/L — ABNORMAL LOW (ref 3.5–5.1)
Sodium: 137 mmol/L (ref 135–145)
TOTAL PROTEIN: 6 g/dL — AB (ref 6.5–8.1)

## 2015-07-27 LAB — OSMOLALITY, URINE: Osmolality, Ur: 701 mOsm/kg (ref 300–900)

## 2015-07-27 LAB — SODIUM, URINE, RANDOM: SODIUM UR: 66 mmol/L

## 2015-07-27 LAB — CBC
HCT: 21.9 % — ABNORMAL LOW (ref 40.0–52.0)
HEMOGLOBIN: 7.5 g/dL — AB (ref 13.0–18.0)
MCH: 31.4 pg (ref 26.0–34.0)
MCHC: 34.1 g/dL (ref 32.0–36.0)
MCV: 92.3 fL (ref 80.0–100.0)
Platelets: 135 10*3/uL — ABNORMAL LOW (ref 150–440)
RBC: 2.38 MIL/uL — ABNORMAL LOW (ref 4.40–5.90)
RDW: 18.6 % — AB (ref 11.5–14.5)
WBC: 5.4 10*3/uL (ref 3.8–10.6)

## 2015-07-27 LAB — PHOSPHORUS: Phosphorus: 2.6 mg/dL (ref 2.5–4.6)

## 2015-07-27 LAB — HEMOGLOBIN: HEMOGLOBIN: 7.5 g/dL — AB (ref 13.0–18.0)

## 2015-07-27 LAB — MAGNESIUM: MAGNESIUM: 2 mg/dL (ref 1.7–2.4)

## 2015-07-27 MED ORDER — LORAZEPAM 2 MG/ML IJ SOLN
2.0000 mg | INTRAMUSCULAR | Status: DC | PRN
  Administered 2015-07-27 – 2015-07-28 (×3): 2 mg via INTRAVENOUS
  Filled 2015-07-27: qty 1
  Filled 2015-07-27: qty 2
  Filled 2015-07-27 (×2): qty 1

## 2015-07-27 MED ORDER — CETYLPYRIDINIUM CHLORIDE 0.05 % MT LIQD
7.0000 mL | Freq: Two times a day (BID) | OROMUCOSAL | Status: DC
  Administered 2015-07-27 – 2015-07-30 (×6): 7 mL via OROMUCOSAL

## 2015-07-27 MED ORDER — FOLIC ACID 5 MG/ML IJ SOLN
1.0000 mg | Freq: Every day | INTRAMUSCULAR | Status: DC
  Filled 2015-07-27: qty 0.2

## 2015-07-27 MED ORDER — LACTULOSE ENEMA
300.0000 mL | Freq: Two times a day (BID) | ORAL | Status: DC
  Administered 2015-07-27 – 2015-07-29 (×5): 300 mL via RECTAL
  Filled 2015-07-27 (×7): qty 300

## 2015-07-27 MED ORDER — CHLORHEXIDINE GLUCONATE 0.12 % MT SOLN
15.0000 mL | Freq: Two times a day (BID) | OROMUCOSAL | Status: DC
  Administered 2015-07-27 – 2015-07-30 (×8): 15 mL via OROMUCOSAL
  Filled 2015-07-27 (×7): qty 15

## 2015-07-27 MED ORDER — POTASSIUM CHLORIDE 10 MEQ/100ML IV SOLN
10.0000 meq | INTRAVENOUS | Status: AC
  Administered 2015-07-27 (×3): 10 meq via INTRAVENOUS
  Filled 2015-07-27 (×6): qty 100

## 2015-07-27 MED ORDER — FOLIC ACID 5 MG/ML IJ SOLN
1.0000 mg | Freq: Every day | INTRAMUSCULAR | Status: DC
  Administered 2015-07-27 – 2015-07-30 (×4): 1 mg via INTRAVENOUS
  Filled 2015-07-27 (×4): qty 0.2

## 2015-07-27 NOTE — Progress Notes (Signed)
eLink Physician-Brief Progress Note Patient Name: Jeff Ward DOB: 1950/11/03 MRN: [id number]   Date of Service  07/27/2015  HPI/Events of Note  crt wnl 210 cc over 12 hours, concerns by RN 35 cc in bladder   eICU Interventions  bmet Urine osm, na     Intervention Category Intermediate Interventions: Oliguria - evaluation and management  Raylene Miyamoto. 07/27/2015, 6:29 PM

## 2015-07-27 NOTE — Care Management (Signed)
Esophogeal varices, banded, intubated, extubated 06/27. Now on RA. Going through withdrawals. Precedex gtt. Restless and not following commands.

## 2015-07-27 NOTE — Progress Notes (Signed)
Pt sat up in bed and bumped his head on IV pole. Small abrasion to forehead noted. Dr. Ashby Dawes notified. Acknowledged. No new orders. Haydin Calandra E 6:32 PM 07/27/2015

## 2015-07-27 NOTE — Progress Notes (Signed)
Pt noted to only have put out 284mL's during this shift. E-Link notified. Jeff Ward E 6:33 PM 07/27/2015

## 2015-07-27 NOTE — Progress Notes (Signed)
[facility] for electrolyte management   Allergies  Allergen Reactions  . Bee Venom Anaphylaxis  . Sulfa Antibiotics Other (See Comments)    Reaction:  Fainting     Patient Measurements: Height: 5\' 5"  (165.1 cm) Weight: 175 lb (79.379 kg) IBW/kg (Calculated) : 61.5   Vital Signs: Temp: 98 F (36.7 C) (06/30 1100) Temp Source: Axillary (06/30 0700) BP: 126/46 mmHg (06/30 1600) Pulse Rate: 60 (06/30 1600) Intake/Output from previous day: 06/29 0701 - 06/30 0700 In: 2061.1 [I.V.:2001.1; NG/GT:60] Out: 1875 [Urine:1875] Intake/Output from this shift: Total I/O In: 896.7 [I.V.:646.5; IV Piggyback:250.2] Out: 1130 [Urine:150; Stool:980]  Labs:  Recent Labs  07/25/15 0616 07/25/15 1716 07/26/15 0435 07/27/15 0434  WBC  --   --  7.2 5.4  HGB 7.6* 7.8* 8.3* 7.5*  HCT 22.4* 23.0* 24.1* 21.9*  PLT  --   --  165 135*  CREATININE 0.84  --  0.85 0.66  MG 1.7  --  1.8 2.0  PHOS 2.4*  --  2.7 2.6  ALBUMIN  --   --   --  2.9*  PROT  --   --   --  6.0*  AST  --   --   --  46*  ALT  --   --   --  22  ALKPHOS  --   --   --  57  BILITOT  --   --   --  1.2   Estimated Creatinine Clearance: 89.5 mL/min (by C-G formula based on Cr of 0.66).   Assessment: Pharmacy consulted for electrolyte management for 65 yo male ICU patient.    Plan:  Will order potassium 75mEq IV x 3 hours.   Will obtain follow-up electrolytes with am labs.   Pharmacy will continue to monitor and adjust per consult.    Aksel Bencomo L 07/27/2015,4:19 PM

## 2015-07-27 NOTE — Progress Notes (Signed)
[facility] Progess Note    ASSESSMENT/PLAN   65 year old male admitted with GI bleed due to esophageal varices, status post banding, known history of cirrhosis. Now complicated by hepatic encephalopathy and etoh withdrawal.   PULMONARY A: Acute respiratory failure, patient was intubated for respiratory protection, has extubated 6/27, continues on room air, oxygen sat 100%   CARDIOVASCULAR A:Essential hypertension, on lisinopril and spironolactone at home.  Elevated BP may also be due to withdrawal.   -Continue IV vasotec.    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 P:  Continue current management, GI following. Now weaned off of octreotide and Protonix infusions. -Continue intermittent dosing of IV PPI.  HEMATOLOGIC A: Blood loss anemia secondary to GI bleeding. P:  Monitor Hb.  Continue to transfuse as needed.  INFECTIOUS - Micro/culture results:  MRSA screening PCR 6/27: Positive BCx2 - UC - Sputum-  Antibiotics: Ceftriaxone 6/27>>  ENDOCRINE A: - glucose controlled, continue to monitor with SSI.   NEUROLOGIC A:  -Alcohol withdrawal delirium Hepatic encephalopathy with hyperammonemia better with lactulose. P: -Continue CIWA protocol -Continue Precedex infusion. -Continue lactulose enema, change to by mouth when patient more awake. -Continue thiamine and vitamin replacement   MAJOR EVENTS/TEST RESULTS:   Best Practices  DVT Prophylaxis: LE stockings.  GI Prophylaxis: On protonix.   ---------------------------------------   ----------------------------------------   Name: Jeff Ward MRN: [id number] DOB: 02-Oct-1950    ADMISSION DATE:  07/23/2015    SUBJECTIVE:   Pt currently on the obtunded, can not provide history or review of systems.     VITAL SIGNS: Temp:  [97.6 F (36.4 C)-98.2 F (36.8 C)] 98 F (36.7 C) (06/30  1100) Pulse Rate:  [53-105] 60 (06/30 1600) Resp:  [12-30] 14 (06/30 1600) BP: (59-188)/(28-98) 126/46 mmHg (06/30 1600) SpO2:  [97 %-100 %] 100 % (06/30 1600) HEMODYNAMICS:   VENTILATOR SETTINGS:   INTAKE / OUTPUT:  Intake/Output Summary (Last 24 hours) at 07/27/15 1710 Last data filed at 07/27/15 1600  Gross per 24 hour  Intake 2957.78 ml  Output   2855 ml  Net 102.78 ml    PHYSICAL EXAMINATION: Physical Examination:   VS: BP 126/46 mmHg  Pulse 60  Temp(Src) 98 F (36.7 C) (Axillary)  Resp 14  Ht 5\' 5"  (1.651 m)  Wt 175 lb (79.379 kg)  BMI 29.12 kg/m2  SpO2 100%  General Appearance: No distress  Neuro:without focal findings, mental statusIs reduced, minimally responsive, does not follow commands. HEENT: PERRLA, EOM intact. Pulmonary: normal breath sounds   CardiovascularNormal S1,S2.  No m/r/g.   Abdomen: Benign, Soft, non-tender. Renal:  No costovertebral tenderness  GU:  Not performed at this time. Endocrine: No evident thyromegaly. Skin:   warm, no rashes, no ecchymosis  Extremities: normal, no cyanosis, clubbing.   LABS:   LABORATORY PANEL:   CBC  Recent Labs Lab 07/27/15 0434  WBC 5.4  HGB 7.5*  HCT 21.9*  PLT 135*    Chemistries   Recent Labs Lab 07/27/15 0434  NA 137  K 3.3*  CL 111  CO2 21*  GLUCOSE 124*  BUN 16  CREATININE 0.66  CALCIUM 8.0*  MG 2.0  PHOS 2.6  AST 46*  ALT 22  ALKPHOS 57  BILITOT 1.2     Recent Labs Lab 07/26/15 1953 07/26/15 2338 07/27/15 0415 07/27/15 0714 07/27/15 1129 07/27/15 1553  GLUCAP 115* 114* 123* 82 110* 107*    Recent Labs Lab 07/24/15  0318 07/24/15 1016  PHART 7.39 7.42  PCO2ART 38 39  PO2ART 220* 69*    Recent Labs Lab 07/23/15 1959 07/27/15 0434  AST 44* 46*  ALT 22 22  ALKPHOS 69 57  BILITOT 0.9 1.2  ALBUMIN 3.5 2.9*    Cardiac Enzymes No results for input(s): TROPONINI in the last 168 hours.  RADIOLOGY:  Ct Head Wo Contrast  07/26/2015  CLINICAL DATA:   65 year old male right altered mental status and unresponsiveness EXAM: CT HEAD WITHOUT CONTRAST TECHNIQUE: Contiguous axial images were obtained from the base of the skull through the vertex without intravenous contrast. COMPARISON:  Head CT dated 08/26/2014 FINDINGS: The ventricles and the sulci are appropriate in size for the patient's age. There is no intracranial hemorrhage. No midline shift or mass effect identified. The gray-white matter differentiation is preserved. Right maxillary sinus retention cyst or polyp. The visualized paranasal sinuses and mastoid air cells are otherwise well aerated. The calvarium is intact. A nasogastric tube is partially visualized. IMPRESSION: No acute intracranial pathology. Electronically Signed   By: Anner Crete M.D.   On: 07/26/2015 02:42   Dg Abd Portable 1v  07/26/2015  CLINICAL DATA:  NG tube placement. EXAM: PORTABLE ABDOMEN - 1 VIEW COMPARISON:  07/26/2015 FINDINGS: Enteric catheter projects over gastric body. The bowel gas pattern is nonobstructive. No radio-opaque calculi or other significant radiographic abnormality are seen. IMPRESSION: Enteric catheter projects over the gastric body. Electronically Signed   By: Fidela Salisbury M.D.   On: 07/26/2015 13:57   Dg Abd Portable 1v  07/26/2015  CLINICAL DATA:  NG tube placement. EXAM: PORTABLE ABDOMEN - 1 VIEW COMPARISON:  CT of the abdomen pelvis 09/20/2014. FINDINGS: Enteric catheters not seen on this limited abdominal radiograph. There is borderline gaseous distention of small bowel loops in the central and left abdomen, suggestive of ileus. IMPRESSION: No enteric catheter is seen. Borderline gaseous distention of small bowel loops, suggestive of potential ileus. Electronically Signed   By: Fidela Salisbury M.D.   On: 07/26/2015 12:40       --Marda Stalker, MD.  ICU Pager: [phone] [facility] Office Number: [phone]  Patricia Pesa, M.D.  Vilinda Boehringer,  M.D.  Merton Border, M.D  07/27/2015   Critical Care Attestation.  I have personally obtained a history, examined the patient, evaluated laboratory and imaging results, formulated the assessment and plan and placed orders. The Patient requires high complexity decision making for assessment and support, frequent evaluation and titration of therapies, application of advanced monitoring technologies and extensive interpretation of multiple databases. The patient has critical illness that could lead imminently to failure of 1 or more organ systems and requires the highest level of physician preparedness to intervene.  Critical Care Time devoted to patient care services described in this note is 40 minutes and is exclusive of time spent in procedures.

## 2015-07-27 NOTE — Progress Notes (Signed)
[place] at [place] NAME: Jeff Ward    MR#:  [id number]  DATE OF BIRTH:  September 13, 1950  SUBJECTIVE:  CHIEF COMPLAINT:   Chief Complaint  Patient presents with  . Hematemesis  . Rectal Bleeding     Came in with black stools and vomiting blood.   Transfused overnight and was intubated for airway protection after esophageal  vericeal banding was done urgently by GI. Hemoglobin stable and patient is extubated . No more bleeding. Ammonia high, remains confused and drowsy.  agitated today, pulled out his NGT. On precedex drip.  REVIEW OF SYSTEMS:   Patient is drowsy and not able to give a review of system.  ROS  DRUG ALLERGIES:   Allergies  Allergen Reactions  . Bee Venom Anaphylaxis  . Sulfa Antibiotics Other (See Comments)    Reaction:  Fainting     VITALS:  Blood pressure 105/43, pulse 52, temperature 96.9 F (36.1 C), temperature source Axillary, resp. rate 12, height 5\' 5"  (1.651 m), weight 79.379 kg (175 lb), SpO2 100 %.  PHYSICAL EXAMINATION:  GENERAL:  66 y.o.-year-old patient lying in the bed with no acute distress. Appears drowsy due to meds. EYES: Pupils equal, round, reactive to light and accommodation. No scleral icterus.  Conjunctiva pale. HEENT: Head atraumatic, normocephalic. Oropharynx and nasopharynx clear.  NECK:  Supple, no jugular venous distention. No thyroid enlargement, no tenderness.  LUNGS: Normal breath sounds bilaterally, no wheezing, rales,rhonchi or crepitation. No use of accessory muscles of respiration.  CARDIOVASCULAR: S1, S2 normal. No murmurs, rubs, or gallops.  ABDOMEN: Soft, nontender, distended. Bowel sounds sluggish. No organomegaly or mass. Rectal tube in place with dark tarry stool. EXTREMITIES: No pedal edema, cyanosis, or clubbing.  NEUROLOGIC: Patient is drowsy and moves limbs and try to get up on stimuli. PSYCHIATRIC: The patient is drowsy. SKIN: No obvious rash, lesion, or ulcer.    Physical Exam LABORATORY PANEL:   CBC  Recent Labs Lab 07/27/15 0434 07/27/15 1654  WBC 5.4  --   HGB 7.5* 7.5*  HCT 21.9*  --   PLT 135*  --    ------------------------------------------------------------------------------------------------------------------  Chemistries   Recent Labs Lab 07/27/15 0434 07/27/15 1907  NA 137 137  K 3.3* 3.5  CL 111 110  CO2 21* 21*  GLUCOSE 124* 112*  BUN 16 19  CREATININE 0.66 0.72  CALCIUM 8.0* 8.1*  MG 2.0  --   AST 46*  --   ALT 22  --   ALKPHOS 57  --   BILITOT 1.2  --    ------------------------------------------------------------------------------------------------------------------  Cardiac Enzymes No results for input(s): TROPONINI in the last 168 hours. ------------------------------------------------------------------------------------------------------------------  RADIOLOGY:  Ct Head Wo Contrast  07/26/2015  CLINICAL DATA:  65 year old male right altered mental status and unresponsiveness EXAM: CT HEAD WITHOUT CONTRAST TECHNIQUE: Contiguous axial images were obtained from the base of the skull through the vertex without intravenous contrast. COMPARISON:  Head CT dated 08/26/2014 FINDINGS: The ventricles and the sulci are appropriate in size for the patient's age. There is no intracranial hemorrhage. No midline shift or mass effect identified. The gray-white matter differentiation is preserved. Right maxillary sinus retention cyst or polyp. The visualized paranasal sinuses and mastoid air cells are otherwise well aerated. The calvarium is intact. A nasogastric tube is partially visualized. IMPRESSION: No acute intracranial pathology. Electronically Signed   By: Anner Crete M.D.   On: 07/26/2015 02:42   Dg Abd Portable 1v  07/26/2015  CLINICAL DATA:  NG tube placement. EXAM: PORTABLE ABDOMEN - 1 VIEW COMPARISON:  07/26/2015 FINDINGS: Enteric catheter projects over gastric body. The bowel gas pattern is  nonobstructive. No radio-opaque calculi or other significant radiographic abnormality are seen. IMPRESSION: Enteric catheter projects over the gastric body. Electronically Signed   By: Fidela Salisbury M.D.   On: 07/26/2015 13:57   Dg Abd Portable 1v  07/26/2015  CLINICAL DATA:  NG tube placement. EXAM: PORTABLE ABDOMEN - 1 VIEW COMPARISON:  CT of the abdomen pelvis 09/20/2014. FINDINGS: Enteric catheters not seen on this limited abdominal radiograph. There is borderline gaseous distention of small bowel loops in the central and left abdomen, suggestive of ileus. IMPRESSION: No enteric catheter is seen. Borderline gaseous distention of small bowel loops, suggestive of potential ileus. Electronically Signed   By: Fidela Salisbury M.D.   On: 07/26/2015 12:40    ASSESSMENT AND PLAN:   Principal Problem:   UGIB (upper gastrointestinal bleed) Active Problems:   HLD (hyperlipidemia)   Essential hypertension, benign   Alcoholic cirrhosis (HCC)   GI bleed   Hematemesis with nausea   Secondary esophageal varices with bleeding ([place])  . UGIB (upper gastrointestinal bleed) vericeal bleed . Alcoholic cirrhosis ([place]), acute blood loss anemia -Admitted to ICU, intubated for airway protection and extubated -Protonix drip, octreotide  Drip- stopped now as no more bleed - and GI did banding. -Serial H&H , received blood transfusion on admission night. - Liquid diet by mouth, when he is awake. INR slightly high. - stat transfusion of 2 packed red blood cells given in ER.  - Esophageal vericeal banding was done urgently overnight by GI. - Now continue monitoring, hemoglobin appears stable.  * Hepatic encephalopathy   Ammonia is elevated, continue lactulose.   Pt is still drowsy   Prophylaxis for SBP.   NGT placed to give lactulose.   Added xifaxan, if he can take.   Pt pulled out NGT- give rectal lactulose.  Alcohol abuse and active withdrawal -CIWA protocol, started on precedex drip.  BP  runs low with drip, monitor and give ativan inj as needed in between.  Marland Kitchen HLD (hyperlipidemia) -Stable, home medications held  . Essential hypertension, benign -Currently stable, home medications held  * Hyperkalemia   Now came down.    All the records are reviewed and case discussed with Care Management/Social Workerr. Management plans discussed with the patient, family and they are in agreement.  CODE STATUS: Full  TOTAL TIME TAKING CARE OF THIS PATIENT: 35 critical care min. Critical due to alcohol withdrawal  POSSIBLE D/C IN 2-3 DAYS, DEPENDING ON CLINICAL CONDITION.   Vaughan Basta M.D on 07/27/2015   Between 7am to 6pm - Pager - [phone]  After 6pm go to [email or web address] - password [email or web address] [facility]  Office  [phone]  CC: Primary care physician; Arnette Norris, MD  Note: This dictation was prepared with Dragon dictation along with smaller phrase technology. Any transcriptional errors that result from this process are unintentional.

## 2015-07-28 ENCOUNTER — Inpatient Hospital Stay: Payer: Medicare Other

## 2015-07-28 LAB — CBC
HEMATOCRIT: 22.4 % — AB (ref 40.0–52.0)
HEMOGLOBIN: 7.4 g/dL — AB (ref 13.0–18.0)
MCH: 30.7 pg (ref 26.0–34.0)
MCHC: 33.1 g/dL (ref 32.0–36.0)
MCV: 92.9 fL (ref 80.0–100.0)
Platelets: 121 10*3/uL — ABNORMAL LOW (ref 150–440)
RBC: 2.41 MIL/uL — AB (ref 4.40–5.90)
RDW: 19.6 % — ABNORMAL HIGH (ref 11.5–14.5)
WBC: 3.9 10*3/uL (ref 3.8–10.6)

## 2015-07-28 LAB — GLUCOSE, CAPILLARY
GLUCOSE-CAPILLARY: 111 mg/dL — AB (ref 65–99)
GLUCOSE-CAPILLARY: 112 mg/dL — AB (ref 65–99)
GLUCOSE-CAPILLARY: 87 mg/dL (ref 65–99)
Glucose-Capillary: 111 mg/dL — ABNORMAL HIGH (ref 65–99)
Glucose-Capillary: 112 mg/dL — ABNORMAL HIGH (ref 65–99)

## 2015-07-28 LAB — MAGNESIUM: MAGNESIUM: 1.8 mg/dL (ref 1.7–2.4)

## 2015-07-28 LAB — COMPREHENSIVE METABOLIC PANEL
ALT: 23 U/L (ref 17–63)
ANION GAP: 4 — AB (ref 5–15)
AST: 46 U/L — ABNORMAL HIGH (ref 15–41)
Albumin: 2.7 g/dL — ABNORMAL LOW (ref 3.5–5.0)
Alkaline Phosphatase: 58 U/L (ref 38–126)
BUN: 19 mg/dL (ref 6–20)
CHLORIDE: 111 mmol/L (ref 101–111)
CO2: 20 mmol/L — AB (ref 22–32)
Calcium: 7.7 mg/dL — ABNORMAL LOW (ref 8.9–10.3)
Creatinine, Ser: 0.85 mg/dL (ref 0.61–1.24)
GFR calc non Af Amer: >60 mL/min (ref >60)
Glucose, Bld: 98 mg/dL (ref 65–99)
POTASSIUM: 3.5 mmol/L (ref 3.5–5.1)
SODIUM: 135 mmol/L (ref 135–145)
Total Bilirubin: 0.9 mg/dL (ref 0.3–1.2)
Total Protein: 5.7 g/dL — ABNORMAL LOW (ref 6.5–8.1)

## 2015-07-28 LAB — AMMONIA: Ammonia: 17 umol/L (ref 9–35)

## 2015-07-28 LAB — PHOSPHORUS: Phosphorus: 3.3 mg/dL (ref 2.5–4.6)

## 2015-07-28 MED ORDER — SODIUM CHLORIDE 0.9 % IV BOLUS (SEPSIS)
500.0000 mL | Freq: Once | INTRAVENOUS | Status: AC
  Administered 2015-07-28: 500 mL via INTRAVENOUS

## 2015-07-28 MED ORDER — DEXMEDETOMIDINE HCL IN NACL 400 MCG/100ML IV SOLN: 0.2000 ug/kg/h | INTRAVENOUS | Status: DC

## 2015-07-28 NOTE — Progress Notes (Signed)
[place] at [place] NAME: Jeff Ward    MR#:  [id number]  DATE OF BIRTH:  1950/04/10  SUBJECTIVE:  CHIEF COMPLAINT:   Chief Complaint  Patient presents with  . Hematemesis  . Rectal Bleeding     Came in with black stools and vomiting blood.   Transfused overnight and was intubated for airway protection after esophageal  vericeal banding was done urgently by GI. Hemoglobin stable and patient is extubated . No more bleeding. Ammonia high, remains confused and drowsy.  agitated due to alcohol withdrawal, On precedex drip.   Now calm- so taperred drip, and only on Ativan inj. Waking up.  REVIEW OF SYSTEMS:   Patient is still drowsy and not able to give a review of system.  ROS  DRUG ALLERGIES:   Allergies  Allergen Reactions  . Bee Venom Anaphylaxis  . Sulfa Antibiotics Other (See Comments)    Reaction:  Fainting     VITALS:  Blood pressure 162/70, pulse 66, temperature 97.6 F (36.4 C), temperature source Axillary, resp. rate 17, height 5\' 5"  (1.651 m), weight 79.379 kg (175 lb), SpO2 100 %.  PHYSICAL EXAMINATION:  GENERAL:  65 y.o.-year-old patient lying in the bed with no acute distress. Appears drowsy due to meds. EYES: Pupils equal, round, reactive to light and accommodation. No scleral icterus.  Conjunctiva pale. HEENT: Head atraumatic, normocephalic. Oropharynx and nasopharynx clear.  NECK:  Supple, no jugular venous distention. No thyroid enlargement, no tenderness.  LUNGS: Normal breath sounds bilaterally, no wheezing, rales,rhonchi or crepitation. No use of accessory muscles of respiration.  CARDIOVASCULAR: S1, S2 normal. No murmurs, rubs, or gallops.  ABDOMEN: Soft, nontender, non distended. Bowel sounds normal. No organomegaly or mass. Rectal tube in place with dark tarry stool. EXTREMITIES: No pedal edema, cyanosis, or clubbing.  NEUROLOGIC: Patient is drowsy and moves limbs on stimuli. PSYCHIATRIC: The patient is  drowsy. SKIN: No obvious rash, lesion, or ulcer.   Physical Exam LABORATORY PANEL:   CBC  Recent Labs Lab 07/28/15 0615  WBC 3.9  HGB 7.4*  HCT 22.4*  PLT 121*   ------------------------------------------------------------------------------------------------------------------  Chemistries   Recent Labs Lab 07/28/15 0615  NA 135  K 3.5  CL 111  CO2 20*  GLUCOSE 98  BUN 19  CREATININE 0.85  CALCIUM 7.7*  MG 1.8  AST 46*  ALT 23  ALKPHOS 58  BILITOT 0.9   ------------------------------------------------------------------------------------------------------------------  Cardiac Enzymes No results for input(s): TROPONINI in the last 168 hours. ------------------------------------------------------------------------------------------------------------------  RADIOLOGY:  Dg Abd 1 View  07/28/2015  CLINICAL DATA:  Ileus EXAM: ABDOMEN - 1 VIEW COMPARISON:  07/26/2015 abdominal radiograph FINDINGS: No disproportionately dilated small bowel loops. Mild-to-moderate diffuse gaseous distention of the colon, slightly increased. No evidence of pneumatosis or pneumoperitoneum. No pathologic soft tissue calcifications. IMPRESSION: 1. No evidence of small-bowel obstruction . 2. Mild colonic ileus, slightly increased. Electronically Signed   By: Ilona Sorrel M.D.   On: 07/28/2015 07:29    ASSESSMENT AND PLAN:   Principal Problem:   UGIB (upper gastrointestinal bleed) Active Problems:   HLD (hyperlipidemia)   Essential hypertension, benign   Alcoholic cirrhosis (HCC)   GI bleed   Hematemesis with nausea   Secondary esophageal varices with bleeding ([place])  . UGIB (upper gastrointestinal bleed) vericeal bleed . Alcoholic cirrhosis ([place]), acute blood loss anemia -Admitted to ICU, intubated for airway protection and extubated -Protonix drip, octreotide  Drip- stopped now as no more bleed - and GI did  banding. -Serial H&H , received blood transfusion on admission night. - Liquid  diet by mouth, when he is awake. INR slightly high. - Esophageal vericeal banding was done urgently overnight by GI. - Now continue monitoring, hemoglobin appears stable.  * Hepatic encephalopathy   Ammonia is elevated, continue lactulose.   Prophylaxis for SBP.   NGT placed to give lactulose.   Added xifaxan, if he can take.   Pt pulled out NGT- give rectal lactulose.   Ammonia level is coming down now.  Alcohol abuse and active withdrawal -CIWA protocol, started on precedex drip.  - tapered off precedex drip, monitor in stepdown unit today.  Marland Kitchen HLD (hyperlipidemia) -Stable, home medications held  . Essential hypertension, benign -Currently stable, home medications held  * Hyperkalemia   Now came down.    All the records are reviewed and case discussed with Care Management/Social Workerr. Management plans discussed with the nurse.  CODE STATUS: Full  TOTAL TIME TAKING CARE OF THIS PATIENT: 35 min.   POSSIBLE D/C IN 2-3 DAYS, DEPENDING ON CLINICAL CONDITION.   Vaughan Basta M.D on 07/28/2015   Between 7am to 6pm - Pager - [phone]  After 6pm go to [email or web address] - password [email or web address] [facility]  Office  [phone]  CC: Primary care physician; Arnette Norris, MD  Note: This dictation was prepared with Dragon dictation along with smaller phrase technology. Any transcriptional errors that result from this process are unintentional.

## 2015-07-28 NOTE — Progress Notes (Signed)
Pt began to c/o "shortness of breath". Pt's lungs clear and equal bilaterally, pt speaking in complete sentences, pulse ox 100%. Pt requested to placed on oxygen. Pt placed on 2 LPM O2 for comfort.

## 2015-07-28 NOTE — Progress Notes (Signed)
[facility] for electrolyte management   Allergies  Allergen Reactions  . Bee Venom Anaphylaxis  . Sulfa Antibiotics Other (See Comments)    Reaction:  Fainting     Patient Measurements: Height: 5\' 5"  (165.1 cm) Weight: 175 lb (79.379 kg) IBW/kg (Calculated) : 61.5   Vital Signs: Temp: 97.6 F (36.4 C) (07/01 1200) Temp Source: Axillary (07/01 1200) BP: 127/60 mmHg (07/01 1300) Pulse Rate: 52 (07/01 1300) Intake/Output from previous day: 06/30 0701 - 07/01 0700 In: 2286.2 [I.V.:1836; IV Piggyback:450.2] Out: 1582 [Urine:202; Stool:1380] Intake/Output from this shift: Total I/O In: 1698.1 [I.V.:1198.1; Other:500] Out: 800 [Urine:300; Stool:500]  Labs:  Recent Labs  07/26/15 0435 07/27/15 0434 07/27/15 1654 07/27/15 1907 07/28/15 0615  WBC 7.2 5.4  --   --  3.9  HGB 8.3* 7.5* 7.5*  --  7.4*  HCT 24.1* 21.9*  --   --  22.4*  PLT 165 135*  --   --  121*  CREATININE 0.85 0.66  --  0.72 0.85  MG 1.8 2.0  --   --  1.8  PHOS 2.7 2.6  --   --  3.3  ALBUMIN  --  2.9*  --   --  2.7*  PROT  --  6.0*  --   --  5.7*  AST  --  46*  --   --  46*  ALT  --  22  --   --  23  ALKPHOS  --  57  --   --  58  BILITOT  --  1.2  --   --  0.9    Lab Results  Component Value Date   K 3.5 07/28/2015   Estimated Creatinine Clearance: 84.2 mL/min (by C-G formula based on Cr of 0.85).   Assessment: Pharmacy consulted for electrolyte management for 65 yo male ICU patient.    Plan:  7/1- K=3.5, Mag=1.8, Phos=3.3. No supplementation required. Will obtain follow-up electrolytes with am labs.   Pharmacy will continue to monitor and adjust per consult.    Dravin Lance A 07/28/2015,2:55 PM

## 2015-07-28 NOTE — Progress Notes (Signed)
[name]    ASSESSMENT/PLAN   65 year old male admitted with GI bleed due to esophageal varices, status post banding, known history of cirrhosis. Now complicated by hepatic encephalopathy and etoh withdrawal.   PULMONARY A: Acute respiratory failure, patient was intubated for respiratory protection, has extubated 6/27, continues on room air, oxygen sat 100%   CARDIOVASCULAR A:Essential hypertension, on lisinopril and spironolactone at home.  Elevated BP may also be due to withdrawal.   -Continue IV vasotec.    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 P:  Continue current management, GI following. Now weaned off of octreotide and Protonix infusions. -Continue intermittent dosing of IV PPI. -. X-ray abdomen images reviewed today, consistent with mild ileus, otherwise unremarkable.  HEMATOLOGIC A: Blood loss anemia secondary to GI bleeding. Hemoglobin appears to have stabilized P:  Monitor Hb.  Continue to transfuse as needed.  INFECTIOUS - Micro/culture results:  MRSA screening PCR 6/27: Positive BCx2 - UC - Sputum-  Antibiotics: Ceftriaxone 6/27>>  ENDOCRINE A: - glucose controlled, continue to monitor with SSI.   NEUROLOGIC A:  -Alcohol withdrawal delirium Hepatic encephalopathy with hyperammonemia better with lactulose. P: -Continue CIWA protocol -I've instructed RN, take the patient off of Precedex. -Change lactulose enema to by mouth. -Continue thiamine and vitamin replacement   MAJOR EVENTS/TEST RESULTS:   Best Practices  DVT Prophylaxis: LE stockings.  GI Prophylaxis: On protonix.   ---------------------------------------   ----------------------------------------   Name: Jeff Ward MRN: [id number] DOB: 17-May-1950    ADMISSION DATE:  07/23/2015    SUBJECTIVE:   Patient is more awake and alert today, has given her  complaints.  -Review of systems: Patient denies chest pain, PND, orthopnea, palpitations, syncopal episodes, the remainder of the review of systems was reviewed with this patient and was found to be negative.     VITAL SIGNS: Temp:  [96.9 F (36.1 C)-98.7 F (37.1 C)] 98.7 F (37.1 C) (07/01 0800) Pulse Rate:  [47-64] 50 (07/01 1100) Resp:  [11-20] 13 (07/01 1100) BP: (82-126)/(38-74) 106/57 mmHg (07/01 1100) SpO2:  [99 %-100 %] 100 % (07/01 1100) HEMODYNAMICS:   VENTILATOR SETTINGS:   INTAKE / OUTPUT:  Intake/Output Summary (Last 24 hours) at 07/28/15 1201 Last data filed at 07/28/15 1100  Gross per 24 hour  Intake 3472.05 ml  Output   1582 ml  Net 1890.05 ml    PHYSICAL EXAMINATION: Physical Examination:   VS: BP 106/57 mmHg  Pulse 50  Temp(Src) 98.7 F (37.1 C) (Axillary)  Resp 13  Ht 5\' 5"  (1.651 m)  Wt 175 lb (79.379 kg)  BMI 29.12 kg/m2  SpO2 100%  General Appearance: No distress  Neuro:without focal findings, mental statusIs reduced, But the patient is responsive and follows commands HEENT: PERRLA, EOM intact. Pulmonary: normal breath sounds   CardiovascularNormal S1,S2.  No m/r/g.   Abdomen: Benign, Soft, non-tender. Renal:  No costovertebral tenderness  GU:  Not performed at this time. Endocrine: No evident thyromegaly. Skin:   warm, no rashes, no ecchymosis  Extremities: normal, no cyanosis, clubbing.   LABS:   LABORATORY PANEL:   CBC  Recent Labs Lab 07/28/15 0615  WBC 3.9  HGB 7.4*  HCT 22.4*  PLT 121*    Chemistries   Recent Labs Lab 07/28/15 0615  NA 135  K 3.5  CL 111  CO2 20*  GLUCOSE 98  BUN 19  CREATININE 0.85  CALCIUM 7.7*  MG 1.8  PHOS 3.3  AST 46*  ALT 23  ALKPHOS 58  BILITOT 0.9     Recent Labs Lab 07/27/15 1553 07/27/15 1950 07/27/15 2341 07/28/15 0336 07/28/15 0712 07/28/15 1155  GLUCAP 107* 94 111* 111* 112* 112*    Recent Labs Lab 07/24/15 0318 07/24/15 1016  PHART 7.39 7.42  PCO2ART  38 39  PO2ART 220* 69*    Recent Labs Lab 07/23/15 1959 07/27/15 0434 07/28/15 0615  AST 44* 46* 46*  ALT 22 22 23   ALKPHOS 69 57 58  BILITOT 0.9 1.2 0.9  ALBUMIN 3.5 2.9* 2.7*    Cardiac Enzymes No results for input(s): TROPONINI in the last 168 hours.  RADIOLOGY:  Dg Abd 1 View  07/28/2015  CLINICAL DATA:  Ileus EXAM: ABDOMEN - 1 VIEW COMPARISON:  07/26/2015 abdominal radiograph FINDINGS: No disproportionately dilated small bowel loops. Mild-to-moderate diffuse gaseous distention of the colon, slightly increased. No evidence of pneumatosis or pneumoperitoneum. No pathologic soft tissue calcifications. IMPRESSION: 1. No evidence of small-bowel obstruction . 2. Mild colonic ileus, slightly increased. Electronically Signed   By: Ilona Sorrel M.D.   On: 07/28/2015 07:29   Dg Abd Portable 1v  07/26/2015  CLINICAL DATA:  NG tube placement. EXAM: PORTABLE ABDOMEN - 1 VIEW COMPARISON:  07/26/2015 FINDINGS: Enteric catheter projects over gastric body. The bowel gas pattern is nonobstructive. No radio-opaque calculi or other significant radiographic abnormality are seen. IMPRESSION: Enteric catheter projects over the gastric body. Electronically Signed   By: Fidela Salisbury M.D.   On: 07/26/2015 13:57   Dg Abd Portable 1v  07/26/2015  CLINICAL DATA:  NG tube placement. EXAM: PORTABLE ABDOMEN - 1 VIEW COMPARISON:  CT of the abdomen pelvis 09/20/2014. FINDINGS: Enteric catheters not seen on this limited abdominal radiograph. There is borderline gaseous distention of small bowel loops in the central and left abdomen, suggestive of ileus. IMPRESSION: No enteric catheter is seen. Borderline gaseous distention of small bowel loops, suggestive of potential ileus. Electronically Signed   By: Fidela Salisbury M.D.   On: 07/26/2015 12:40       --Marda Stalker, MD.  ICU Pager: [phone] [facility] Office Number: [phone]  Patricia Pesa, M.D.  Vilinda Boehringer,  M.D.  Merton Border, M.D  07/28/2015

## 2015-07-28 NOTE — Progress Notes (Signed)
At 1637, pt's CBG 102 mg/dL. The information was not uploaded into the results review. No sliding scale coverage given.

## 2015-07-29 LAB — BASIC METABOLIC PANEL
Anion gap: 4 — ABNORMAL LOW (ref 5–15)
BUN: 11 mg/dL (ref 6–20)
CHLORIDE: 113 mmol/L — AB (ref 101–111)
CO2: 19 mmol/L — AB (ref 22–32)
Calcium: 8.2 mg/dL — ABNORMAL LOW (ref 8.9–10.3)
Creatinine, Ser: 0.72 mg/dL (ref 0.61–1.24)
GFR calc non Af Amer: >60 mL/min (ref >60)
GLUCOSE: 105 mg/dL — AB (ref 65–99)
POTASSIUM: 3.2 mmol/L — AB (ref 3.5–5.1)
SODIUM: 136 mmol/L (ref 135–145)

## 2015-07-29 LAB — GLUCOSE, CAPILLARY
GLUCOSE-CAPILLARY: 114 mg/dL — AB (ref 65–99)
GLUCOSE-CAPILLARY: 90 mg/dL (ref 65–99)
GLUCOSE-CAPILLARY: 108 mg/dL — AB (ref 65–99)
GLUCOSE-CAPILLARY: 118 mg/dL — AB (ref 65–99)
Glucose-Capillary: 141 mg/dL — ABNORMAL HIGH (ref 65–99)

## 2015-07-29 LAB — MAGNESIUM: Magnesium: 1.8 mg/dL (ref 1.7–2.4)

## 2015-07-29 LAB — POTASSIUM: POTASSIUM: 3.4 mmol/L — AB (ref 3.5–5.1)

## 2015-07-29 LAB — PHOSPHORUS: PHOSPHORUS: 3.6 mg/dL (ref 2.5–4.6)

## 2015-07-29 MED ORDER — POTASSIUM CHLORIDE 10 MEQ/100ML IV SOLN
10.0000 meq | INTRAVENOUS | Status: AC
  Administered 2015-07-29 (×4): 10 meq via INTRAVENOUS
  Filled 2015-07-29 (×4): qty 100

## 2015-07-29 MED ORDER — POTASSIUM CHLORIDE CRYS ER 20 MEQ PO TBCR
20.0000 meq | EXTENDED_RELEASE_TABLET | Freq: Two times a day (BID) | ORAL | Status: DC
  Administered 2015-07-29 – 2015-07-30 (×3): 20 meq via ORAL
  Filled 2015-07-29 (×3): qty 1

## 2015-07-29 MED ORDER — LACTULOSE 10 GM/15ML PO SOLN
20.0000 g | Freq: Every day | ORAL | Status: DC
  Administered 2015-07-29: 20 g via ORAL
  Filled 2015-07-29: qty 30

## 2015-07-29 MED ORDER — POTASSIUM CHLORIDE CRYS ER 20 MEQ PO TBCR: 20.0000 meq | EXTENDED_RELEASE_TABLET | Freq: Once | ORAL | Status: DC

## 2015-07-29 MED ORDER — FUROSEMIDE 20 MG PO TABS
20.0000 mg | ORAL_TABLET | Freq: Every day | ORAL | Status: DC
  Administered 2015-07-29 – 2015-07-30 (×2): 20 mg via ORAL
  Filled 2015-07-29 (×2): qty 1

## 2015-07-29 MED ORDER — MAGNESIUM SULFATE 2 GM/50ML IV SOLN
2.0000 g | Freq: Once | INTRAVENOUS | Status: AC
  Administered 2015-07-29: 2 g via INTRAVENOUS
  Filled 2015-07-29: qty 50

## 2015-07-29 MED ORDER — LACTULOSE 10 GM/15ML PO SOLN
20.0000 g | Freq: Two times a day (BID) | ORAL | Status: DC
  Administered 2015-07-29 – 2015-07-30 (×2): 20 g via ORAL
  Filled 2015-07-29 (×2): qty 30

## 2015-07-29 MED ORDER — SPIRONOLACTONE 25 MG PO TABS
50.0000 mg | ORAL_TABLET | Freq: Every day | ORAL | Status: DC
  Administered 2015-07-30: 50 mg via ORAL
  Filled 2015-07-29 (×3): qty 2

## 2015-07-29 NOTE — Progress Notes (Addendum)
[facility] for electrolyte management   Allergies  Allergen Reactions  . Bee Venom Anaphylaxis  . Sulfa Antibiotics Other (See Comments)    Reaction:  Fainting     Patient Measurements: Height: 5\' 5"  (165.1 cm) Weight: 175 lb (79.379 kg) IBW/kg (Calculated) : 61.5   Vital Signs: Temp: 99.1 F (37.3 C) (07/02 1456) Temp Source: Oral (07/02 1456) BP: 170/82 mmHg (07/02 1456) Pulse Rate: 80 (07/02 1456) Intake/Output from previous day: 07/01 0701 - 07/02 0700 In: 4326.1 [P.O.:1000; I.V.:2826.1] Out: 3350 [Urine:2850; Stool:500] Intake/Output from this shift:    Labs:  Recent Labs  07/27/15 0434 07/27/15 1654 07/27/15 1907 07/28/15 0615 07/29/15 0425  WBC 5.4  --   --  3.9  --   HGB 7.5* 7.5*  --  7.4*  --   HCT 21.9*  --   --  22.4*  --   PLT 135*  --   --  121*  --   CREATININE 0.66  --  0.72 0.85 0.72  MG 2.0  --   --  1.8 1.8  PHOS 2.6  --   --  3.3 3.6  ALBUMIN 2.9*  --   --  2.7*  --   PROT 6.0*  --   --  5.7*  --   AST 46*  --   --  46*  --   ALT 22  --   --  23  --   ALKPHOS 57  --   --  58  --   BILITOT 1.2  --   --  0.9  --     Lab Results  Component Value Date   K 3.4* 07/29/2015   Estimated Creatinine Clearance: 89.5 mL/min (by C-G formula based on Cr of 0.72).   Assessment: Pharmacy consulted for electrolyte management for 65 yo male ICU patient.    Plan:  7/1- K=3.5, Mag=1.8, Phos=3.3. No supplementation required. Will obtain follow-up electrolytes with am labs.   7/2- K=3.2, Mag=1.8, Phos=3.6.   MD ordered KCL 64meq IV x 4 doses and Magnesium 2 gram IV x 1.   Will recheck K+ at 1800. F/u electrolytes in am.  7/2 1800 k=3.4 Pt already has an order for KCL 20MEQ to be given at 2200. Will not give any additonal. Follow up in AM  Camrin Lapre D Anneka Studer 07/29/2015,7:17 PM

## 2015-07-29 NOTE — Progress Notes (Signed)
[facility] for electrolyte management   Allergies  Allergen Reactions  . Bee Venom Anaphylaxis  . Sulfa Antibiotics Other (See Comments)    Reaction:  Fainting     Patient Measurements: Height: 5\' 5"  (165.1 cm) Weight: 175 lb (79.379 kg) IBW/kg (Calculated) : 61.5   Vital Signs: Temp: 99.3 F (37.4 C) (07/02 0800) Temp Source: Oral (07/02 0800) BP: 150/74 mmHg (07/02 0900) Pulse Rate: 81 (07/02 0200) Intake/Output from previous day: 07/01 0701 - 07/02 0700 In: 4326.1 [P.O.:1000; I.V.:2826.1] Out: 3350 [Urine:2850; Stool:500] Intake/Output from this shift: Total I/O In: 487 [P.O.:237; I.V.:250] Out: -   Labs:  Recent Labs  07/27/15 0434 07/27/15 1654 07/27/15 1907 07/28/15 0615 07/29/15 0425  WBC 5.4  --   --  3.9  --   HGB 7.5* 7.5*  --  7.4*  --   HCT 21.9*  --   --  22.4*  --   PLT 135*  --   --  121*  --   CREATININE 0.66  --  0.72 0.85 0.72  MG 2.0  --   --  1.8 1.8  PHOS 2.6  --   --  3.3 3.6  ALBUMIN 2.9*  --   --  2.7*  --   PROT 6.0*  --   --  5.7*  --   AST 46*  --   --  46*  --   ALT 22  --   --  23  --   ALKPHOS 57  --   --  58  --   BILITOT 1.2  --   --  0.9  --     Lab Results  Component Value Date   K 3.2* 07/29/2015   Estimated Creatinine Clearance: 89.5 mL/min (by C-G formula based on Cr of 0.72).   Assessment: Pharmacy consulted for electrolyte management for 65 yo male ICU patient.    Plan:  7/1- K=3.5, Mag=1.8, Phos=3.3. No supplementation required. Will obtain follow-up electrolytes with am labs.   7/2- K=3.2, Mag=1.8, Phos=3.6.   MD ordered KCL 54meq IV x 4 doses and Magnesium 2 gram IV x 1.   Will recheck K+ at 1800. F/u electrolytes in am.   Hazyl Marseille A 07/29/2015,9:33 AM

## 2015-07-29 NOTE — Progress Notes (Signed)
Patient transfer from CCU; report from [place].

## 2015-07-29 NOTE — Progress Notes (Signed)
[facility] ADULT ICU REPLACEMENT PROTOCOL FOR AM LAB REPLACEMENT ONLY  The patient does apply for the [facility] Adult ICU Electrolyte Replacment Protocol based on the criteria listed below:   1. Is GFR >/= 40 ml/min? Yes.    Patient's GFR today is >60 2. Is urine output >/= 0.5 ml/kg/hr for the last 6 hours? Yes.   Patient's UOP is 1.26 ml/kg/hr 3. Is BUN < 60 mg/dL? Yes.    Patient's BUN today is 11 4. Abnormal electrolyte  K 3.2, Mg 1.8 5. Ordered repletion with: per protocol 6. If a panic level lab has been reported, has the CCM MD in charge been notified? Yes.  .   Physician:  E Deterding  Christeen Douglas 07/29/2015 5:08 AM

## 2015-07-29 NOTE — Progress Notes (Signed)
[place] [facility] [place] NAME: Jeff Ward    MR#:  [id number]  DATE OF BIRTH:  1950/07/14  SUBJECTIVE:  CHIEF COMPLAINT:   Chief Complaint  Patient presents with  . Hematemesis  . Rectal Bleeding     Came in with black stools and vomiting blood.   Transfused overnight and was intubated for airway protection after esophageal  vericeal banding was done urgently by GI. Hemoglobin stable and patient is extubated . No more bleeding. Ammonia high, remains confused and drowsy.  agitated due to alcohol withdrawal, On precedex drip.    Taperred off precedex 07/28/15- Now on Ativan PRN, and alert.   Transferred to floor 07/29/15.  REVIEW OF SYSTEMS:    Review of Systems  Constitutional: Positive for malaise/fatigue. Negative for fever and weight loss.  HENT: Negative for congestion, ear pain and tinnitus.   Eyes: Negative for blurred vision, double vision and pain.  Respiratory: Negative for cough, hemoptysis and shortness of breath.   Cardiovascular: Negative for chest pain, palpitations and orthopnea.  Gastrointestinal: Negative for nausea, vomiting and abdominal pain.  Genitourinary: Negative for dysuria, urgency and frequency.  Musculoskeletal: Negative for myalgias, joint pain and neck pain.  Skin: Negative for rash.  Neurological: Negative for dizziness, tremors, sensory change, focal weakness, loss of consciousness and headaches.  Psychiatric/Behavioral: Negative for depression.    DRUG ALLERGIES:   Allergies  Allergen Reactions  . Bee Venom Anaphylaxis  . Sulfa Antibiotics Other (See Comments)    Reaction:  Fainting     VITALS:  Blood pressure 170/82, pulse 80, temperature 99.1 F (37.3 C), temperature source Oral, resp. rate 20, height 5\' 5"  (1.651 m), weight 79.379 kg (175 lb), SpO2 98 %.  PHYSICAL EXAMINATION:  GENERAL:  65 y.o.-year-old patient lying in the bed with no acute distress.  EYES: Pupils equal, round, reactive to light  and accommodation. No scleral icterus.  Conjunctiva pale. HEENT: Head atraumatic, normocephalic. Oropharynx and nasopharynx clear.  NECK:  Supple, no jugular venous distention. No thyroid enlargement, no tenderness.  LUNGS: Normal breath sounds bilaterally, no wheezing, rales,rhonchi or crepitation. No use of accessory muscles of respiration.  CARDIOVASCULAR: S1, S2 normal. No murmurs, rubs, or gallops.  ABDOMEN: Soft, nontender, non distended. Bowel sounds normal. No organomegaly or mass.  EXTREMITIES: No pedal edema, cyanosis, or clubbing.  NEUROLOGIC: follows commands power 4/5 all limbs, gait not checked. PSYCHIATRIC: The patient is alert and oriented. SKIN: No obvious rash, lesion, or ulcer.   Physical Exam LABORATORY PANEL:   CBC  Recent Labs Lab 07/28/15 0615  WBC 3.9  HGB 7.4*  HCT 22.4*  PLT 121*   ------------------------------------------------------------------------------------------------------------------  Chemistries   Recent Labs Lab 07/28/15 0615 07/29/15 0425  NA 135 136  K 3.5 3.2*  CL 111 113*  CO2 20* 19*  GLUCOSE 98 105*  BUN 19 11  CREATININE 0.85 0.72  CALCIUM 7.7* 8.2*  MG 1.8 1.8  AST 46*  --   ALT 23  --   ALKPHOS 58  --   BILITOT 0.9  --    ------------------------------------------------------------------------------------------------------------------  Cardiac Enzymes No results for input(s): TROPONINI in the last 168 hours. ------------------------------------------------------------------------------------------------------------------  RADIOLOGY:  Dg Abd 1 View  07/28/2015  CLINICAL DATA:  Ileus EXAM: ABDOMEN - 1 VIEW COMPARISON:  07/26/2015 abdominal radiograph FINDINGS: No disproportionately dilated small bowel loops. Mild-to-moderate diffuse gaseous distention of the colon, slightly increased. No evidence of pneumatosis or pneumoperitoneum. No pathologic soft tissue calcifications. IMPRESSION: 1. No  evidence of small-bowel  obstruction . 2. Mild colonic ileus, slightly increased. Electronically Signed   By: Ilona Sorrel M.D.   On: 07/28/2015 07:29    ASSESSMENT AND PLAN:   Principal Problem:   UGIB (upper gastrointestinal bleed) Active Problems:   HLD (hyperlipidemia)   Essential hypertension, benign   Alcoholic cirrhosis (HCC)   GI bleed   Hematemesis with nausea   Secondary esophageal varices with bleeding ([place])  . UGIB (upper gastrointestinal bleed) vericeal bleed . Alcoholic cirrhosis ([place]), acute blood loss anemia -Admitted to ICU, intubated for airway protection and extubated -Protonix drip, octreotide  Drip- stopped now as no more bleed - and GI did banding. -Serial H&H , received blood transfusion on admission night. - Liquid diet by mouth, when he is awake. INR slightly high. - Esophageal vericeal banding was done urgently overnight by GI. - Now continue monitoring, hemoglobin appears stable. - tolerating regular food.  * Hepatic encephalopathy   Ammonia is elevated, continue lactulose.   Prophylaxis for SBP.   NGT placed to give lactulose.   Added xifaxan, if he can take.   Pt pulled out NGT- give rectal lactulose.   Ammonia level is coming down now.   Baseline dose- lactulose BID- continue now.  Alcohol abuse and active withdrawal - CIWA protocol, started on precedex drip.  - tapered off precedex drip,  - now continue ativan PRN, pt is alert and oriented.  Marland Kitchen HLD (hyperlipidemia) -Stable, home medications held  . Essential hypertension, benign -Currently stable, home medications held  * Hyperkalemia   Now came down.  * Hypokalemia    Replace orally now.    All the records are reviewed and case discussed with Care Management/Social Workerr. Management plans discussed with the nurse.  CODE STATUS: Full  TOTAL TIME TAKING CARE OF THIS PATIENT: 35 min. Get PT eval, Replace K, Likely d.c tomorrow.  POSSIBLE D/C IN 2-3 DAYS, DEPENDING ON CLINICAL CONDITION.  Pt's wife  and daughter were present in room today.  Vaughan Basta M.D on 07/29/2015   Between 7am to 6pm - Pager - [phone]  After 6pm go to [email or web address] - password [email or web address] [facility]  Office  [phone]  CC: Primary care physician; Arnette Norris, MD  Note: This dictation was prepared with Dragon dictation along with smaller phrase technology. Any transcriptional errors that result from this process are unintentional.

## 2015-07-29 NOTE — Progress Notes (Signed)
CCU nurse came to help removed patient's rectal tube after ortho nurse made a couple of attempts to expel water from balloon. Removed without any problems.

## 2015-07-29 NOTE — Progress Notes (Signed)
Report called to [facility] on 2A.  Pt transported with monitor on, 100% on room air

## 2015-07-29 NOTE — Progress Notes (Addendum)
[place] [facility] Progess Note    ASSESSMENT/PLAN   65 year old male admitted with GI bleed due to esophageal varices, status post banding, known history of cirrhosis. Now complicated by hepatic encephalopathy and etoh withdrawal.   PULMONARY A: Acute respiratory failure, patient was intubated for respiratory protection, has extubated 6/27, continues on room air, oxygen sat 100%   CARDIOVASCULAR A:Essential hypertension, on lisinopril and spironolactone at home.  Elevated BP may also be due to withdrawal.   -Change to home lisinopril 40 mg daily. Will DC IV vasotec.     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 P:  Continue current management, GI following. Now weaned off of octreotide and Protonix infusions. -Continue intermittent dosing of IV PPI. -Changed lactulose to PO, continue rifaximin.   HEMATOLOGIC A: Blood loss anemia secondary to GI bleeding. Hemoglobin appears to have stabilized P:  Monitor Hb.  Continue to transfuse as needed.  INFECTIOUS - Micro/culture results:  MRSA screening PCR 6/27: Positive BCx2 - UC - Sputum-  Antibiotics: Ceftriaxone 6/27>>  ENDOCRINE A: - glucose controlled, continue to monitor with SSI.   NEUROLOGIC A:  -Alcohol withdrawal delirium Hepatic encephalopathy with hyperammonemia better with lactulose. P: -Continue CIWA protocol --Off precedex x >24 hours.    MAJOR EVENTS/TEST RESULTS:   Best Practices  DVT Prophylaxis: LE stockings.  GI Prophylaxis: On protonix.   ---------------------------------------   ----------------------------------------   Name: Jeff Ward MRN: [id number] DOB: [birth year]    ADMISSION DATE:  07/23/2015    SUBJECTIVE:   Patient is more awake and alert today, no complaints.   -Review of systems: Patient denies chest pain, PND, orthopnea, palpitations, syncopal episodes, the  remainder of the review of systems was reviewed with this patient and was found to be negative.     VITAL SIGNS: Temp:  [97.6 F (36.4 C)-99.3 F (37.4 C)] 99.3 F (37.4 C) (07/02 0800) Pulse Rate:  [50-84] 81 (07/02 0200) Resp:  [13-23] 23 (07/02 0900) BP: (106-178)/(52-124) 150/74 mmHg (07/02 0900) SpO2:  [99 %-100 %] 99 % (07/02 0900) HEMODYNAMICS:   VENTILATOR SETTINGS:   INTAKE / OUTPUT:  Intake/Output Summary (Last 24 hours) at 07/29/15 1022 Last data filed at 07/29/15 0911  Gross per 24 hour  Intake   4164 ml  Output   3400 ml  Net    764 ml    PHYSICAL EXAMINATION: Physical Examination:   VS: BP 150/74 mmHg  Pulse 81  Temp(Src) 99.3 F (37.4 C) (Oral)  Resp 23  Ht 5\' 5"  (1.651 m)  Wt 175 lb (79.379 kg)  BMI 29.12 kg/m2  SpO2 99%  General Appearance: No distress  Neuro:without focal findings, mental status awake, alert.  HEENT: PERRLA, EOM intact. Pulmonary: normal breath sounds   CardiovascularNormal S1,S2.  No m/r/g.   Abdomen: Benign, Soft, non-tender. Renal:  No costovertebral tenderness  GU:  Not performed at this time. Endocrine: No evident thyromegaly. Skin:   warm, no rashes, no ecchymosis  Extremities: normal, no cyanosis, clubbing.   LABS:   LABORATORY PANEL:   CBC  Recent Labs Lab 07/28/15 0615  WBC 3.9  HGB 7.4*  HCT 22.4*  PLT 121*    Chemistries   Recent Labs Lab 07/28/15 0615 07/29/15 0425  NA 135 136  K 3.5 3.2*  CL 111 113*  CO2 20* 19*  GLUCOSE 98 105*  BUN 19 11  CREATININE 0.85 0.72  CALCIUM 7.7* 8.2*  MG 1.8 1.8  PHOS 3.3 3.6  AST 46*  --   ALT 23  --   ALKPHOS 58  --   BILITOT 0.9  --      Recent Labs Lab 07/28/15 0712 07/28/15 1155 07/28/15 1937 07/28/15 2335 07/29/15 0341 07/29/15 0654  GLUCAP 112* 112* 87 111* 114* 90    Recent Labs Lab 07/24/15 0318 07/24/15 1016  PHART 7.39 7.42  PCO2ART 38 39  PO2ART 220* 69*    Recent Labs Lab 07/23/15 1959 07/27/15 0434 07/28/15 0615   AST 44* 46* 46*  ALT 22 22 23   ALKPHOS 69 57 58  BILITOT 0.9 1.2 0.9  ALBUMIN 3.5 2.9* 2.7*    Cardiac Enzymes No results for input(s): TROPONINI in the last 168 hours.  RADIOLOGY:  Dg Abd 1 View  07/28/2015  CLINICAL DATA:  Ileus EXAM: ABDOMEN - 1 VIEW COMPARISON:  07/26/2015 abdominal radiograph FINDINGS: No disproportionately dilated small bowel loops. Mild-to-moderate diffuse gaseous distention of the colon, slightly increased. No evidence of pneumatosis or pneumoperitoneum. No pathologic soft tissue calcifications. IMPRESSION: 1. No evidence of small-bowel obstruction . 2. Mild colonic ileus, slightly increased. Electronically Signed   By: Ilona Sorrel M.D.   On: 07/28/2015 07:29       --Marda Stalker, MD.  ICU Pager: [phone] [facility] Office Number: [phone]  Patricia Pesa, M.D.  Vilinda Boehringer, M.D.  Merton Border, M.D  07/29/2015   Pt seen and examined with NP, agree with assessment and plan. Pt is awake and alert today, no complaints. Will change lactulose and anti-hypertensives to PO.   Marda Stalker, M.D.  07/29/2015

## 2015-07-30 LAB — BASIC METABOLIC PANEL
Anion gap: 3 — ABNORMAL LOW (ref 5–15)
BUN: 9 mg/dL (ref 6–20)
CHLORIDE: 111 mmol/L (ref 101–111)
CO2: 20 mmol/L — ABNORMAL LOW (ref 22–32)
CREATININE: 0.73 mg/dL (ref 0.61–1.24)
Calcium: 7.9 mg/dL — ABNORMAL LOW (ref 8.9–10.3)
GFR calc Af Amer: >60 mL/min (ref >60)
GLUCOSE: 115 mg/dL — AB (ref 65–99)
Potassium: 3.5 mmol/L (ref 3.5–5.1)
Sodium: 134 mmol/L — ABNORMAL LOW (ref 135–145)

## 2015-07-30 LAB — GLUCOSE, CAPILLARY: Glucose-Capillary: 102 mg/dL — ABNORMAL HIGH (ref 65–99)

## 2015-07-30 LAB — MAGNESIUM: MAGNESIUM: 1.7 mg/dL (ref 1.7–2.4)

## 2015-07-30 MED ORDER — POTASSIUM CHLORIDE CRYS ER 20 MEQ PO TBCR: 20.0000 meq | EXTENDED_RELEASE_TABLET | Freq: Two times a day (BID) | ORAL | Status: DC

## 2015-07-30 MED ORDER — LACTULOSE 10 GM/15ML PO SOLN: 20.0000 g | Freq: Two times a day (BID) | ORAL | Status: DC

## 2015-07-30 MED ORDER — FERROUS SULFATE 325 (65 FE) MG PO TABS: 325.0000 mg | ORAL_TABLET | Freq: Two times a day (BID) | ORAL | Status: DC

## 2015-07-30 MED ORDER — PANTOPRAZOLE SODIUM 40 MG PO TBEC: 40.0000 mg | DELAYED_RELEASE_TABLET | Freq: Two times a day (BID) | ORAL | Status: DC

## 2015-07-30 MED ORDER — CIPROFLOXACIN HCL 500 MG PO TABS: 500.0000 mg | ORAL_TABLET | ORAL | Status: DC

## 2015-07-30 MED ORDER — RIFAXIMIN 550 MG PO TABS: 550.0000 mg | ORAL_TABLET | Freq: Two times a day (BID) | ORAL | Status: DC

## 2015-07-30 MED ORDER — FOLIC ACID 1 MG PO TABS: 1.0000 mg | ORAL_TABLET | Freq: Every day | ORAL | Status: DC

## 2015-07-30 MED ORDER — FUROSEMIDE 20 MG PO TABS: 20.0000 mg | ORAL_TABLET | Freq: Every day | ORAL | Status: DC

## 2015-07-30 MED ORDER — THIAMINE HCL 100 MG PO TABS: 100.0000 mg | ORAL_TABLET | Freq: Two times a day (BID) | ORAL | Status: DC

## 2015-07-30 NOTE — Discharge Summary (Signed)
[facility] NAME: Jeff Ward    MR#:  [id number]  DATE OF BIRTH:  1950/08/27  DATE OF ADMISSION:  07/23/2015 ADMITTING PHYSICIAN: Quintella Baton, MD  DATE OF DISCHARGE: 07/30/2015  PRIMARY CARE PHYSICIAN: Arnette Norris, MD    ADMISSION DIAGNOSIS:  Anemia, unspecified anemia type [D64.9] Gastrointestinal hemorrhage, unspecified gastritis, unspecified gastrointestinal hemorrhage type [K92.2]  DISCHARGE DIAGNOSIS:  Principal Problem:   UGIB (upper gastrointestinal bleed) Active Problems:   HLD (hyperlipidemia)   Essential hypertension, benign   Alcoholic cirrhosis (HCC)   GI bleed   Hematemesis with nausea   Secondary esophageal varices with bleeding (HCC)   Hepatic encephalopathy  Alcohol withdrawal.  SECONDARY DIAGNOSIS:   Past Medical History  Diagnosis Date  . 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 Hyperlipidemia 06/2002  . Hypertension   . Arthritis     In back and all joints  . Depression   . Chronic headache   . GI bleed   . Kidney stones   . GERD (gastroesophageal reflux disease)   . Hiatal hernia   . Esophageal varices ([place])     HOSPITAL COURSE:   . UGIB (upper gastrointestinal bleed) vericeal bleed . Alcoholic cirrhosis ([place]), acute blood loss anemia -Admitted to ICU, intubated for airway protection and extubated -Protonix drip, octreotide Drip- stopped now as no more bleed - and GI did banding. -Serial H&H , received blood transfusion on admission night. - Liquid diet by mouth, when he is awake. INR slightly high. - Esophageal vericeal banding was done urgently overnight by GI. - Now continue monitoring, hemoglobin appears stable. - tolerating regular food.  * Hepatic encephalopathy  Ammonia is elevated, continue lactulose.  Prophylaxis for SBP.  NGT placed to give lactulose.  Added xifaxan, already on spironolactone and lasix.  Pt pulled out NGT- give rectal  lactulose.  Ammonia level is coming down now.  Baseline dose- lactulose BID- continue now.   Given rocephin IV for SBP prophylaxis.   Will give oral cipro on discharge.    Alcohol abuse and active withdrawal - CIWA protocol, started on precedex drip. - tapered off precedex drip,  - now continue ativan PRN, pt is alert and oriented.  Marland Kitchen HLD (hyperlipidemia) -Stable, home medications held  . Essential hypertension, benign -Currently stable, home medications held - Resumed on discharge.  * Hyperkalemia  Now came down.  * Hypokalemia  Replace orally now.  replacement with daily use of lasix.  DISCHARGE CONDITIONS:   Stable.  CONSULTS OBTAINED:  Treatment Team:  Lucilla Lame, MD  DRUG ALLERGIES:   Allergies  Allergen Reactions  . Bee Venom Anaphylaxis  . Sulfa Antibiotics Other (See Comments)    Reaction:  Fainting     DISCHARGE MEDICATIONS:   Current Discharge Medication List    START taking these medications   Details  ciprofloxacin (CIPRO) 500 MG tablet Take 1 tablet (500 mg total) by mouth 2 (two) times a week. Qty: 30 tablet, Refills: 0    ferrous sulfate 325 (65 FE) MG tablet Take 1 tablet (325 mg total) by mouth 2 (two) times daily with a meal. Qty: 60 tablet, Refills: 3    folic acid (FOLVITE) 1 MG tablet Take 1 tablet (1 mg total) by mouth daily. Qty: 30 tablet, Refills: 0    furosemide (LASIX) 20 MG tablet Take 1 tablet (20 mg total) by mouth daily. Qty: 30 tablet, Refills: 0    lactulose (CHRONULAC) 10  GM/15ML solution Take 30 mLs (20 g total) by mouth 2 (two) times daily. Qty: 240 mL, Refills: 2    potassium chloride SA (K-DUR,KLOR-CON) 20 MEQ tablet Take 1 tablet (20 mEq total) by mouth 2 (two) times daily. Qty: 60 tablet, Refills: 0    rifaximin (XIFAXAN) 550 MG TABS tablet Take 1 tablet (550 mg total) by mouth 2 (two) times daily. Qty: 60 tablet, Refills: 0    thiamine 100 MG tablet Take 1 tablet (100 mg total) by mouth 2 (two) times  daily. Qty: 60 tablet, Refills: 0      CONTINUE these medications which have CHANGED   Details  pantoprazole (PROTONIX) 40 MG tablet Take 1 tablet (40 mg total) by mouth 2 (two) times daily. Qty: 60 tablet, Refills: 2      CONTINUE these medications which have NOT CHANGED   Details  DULoxetine (CYMBALTA) 30 MG capsule Take 30 mg by mouth daily.    EPINEPHrine (EPIPEN 2-PAK) 0.3 mg/0.3 mL IJ SOAJ injection Inject 0.3 mg into the muscle once as needed (for severe allergic reaction).    fenofibrate (TRICOR) 145 MG tablet Take 145 mg by mouth daily.    glucosamine-chondroitin 500-400 MG tablet Take 1 tablet by mouth 2 (two) times daily.      Iron-FA-B Cmp-C-Biot-Probiotic (FUSION PLUS) CAPS Take 1 capsule by mouth 2 (two) times daily with a meal. Qty: 60 capsule, Refills: 2    meclizine (ANTIVERT) 25 MG tablet Take 1 tablet (25 mg total) by mouth daily as needed for dizziness or nausea. Qty: 90 tablet, Refills: 0    Multiple Vitamin (MULTIVITAMIN WITH MINERALS) TABS tablet Take 1 tablet by mouth daily.    spironolactone (ALDACTONE) 50 MG tablet Take 2 tablets (100 mg total) by mouth daily. Qty: 30 tablet, Refills: 6    tetrahydrozoline 0.05 % ophthalmic solution Place 1 drop into both eyes daily.      STOP taking these medications     diclofenac (VOLTAREN) 75 MG EC tablet      furosemide (LASIX) 10 MG/ML solution      lisinopril (PRINIVIL,ZESTRIL) 40 MG tablet      meloxicam (MOBIC) 7.5 MG tablet      Potassium 99 MG TABS          DISCHARGE INSTRUCTIONS:    Follow with Dr. Allen Norris- Gi in 2 weeks.  If you experience worsening of your admission symptoms, develop shortness of breath, life threatening emergency, suicidal or homicidal thoughts you must seek medical attention immediately by calling 911 or calling your MD immediately  if symptoms less severe.  You Must read complete instructions/literature along with all the possible adverse reactions/side effects for all  the Medicines you take and that have been prescribed to you. Take any new Medicines after you have completely understood and accept all the possible adverse reactions/side effects.   Please note  You were cared for by a hospitalist during your hospital stay. If you have any questions about your discharge medications or the care you received while you were in the hospital after you are discharged, you can call the unit and asked to speak with the hospitalist on call if the hospitalist that took care of you is not available. Once you are discharged, your primary care physician will handle any further medical issues. Please note that NO REFILLS for any discharge medications will be authorized once you are discharged, as it is imperative that you return to your primary care physician (or establish a relationship with  a primary care physician if you do not have one) for your aftercare needs so that they can reassess your need for medications and monitor your lab values.    Today   CHIEF COMPLAINT:   Chief Complaint  Patient presents with  . Hematemesis  . Rectal Bleeding    HISTORY OF PRESENT ILLNESS:  Crystal Hakola  is a 65 y.o. male with a known history of alcoholic. He drinks whiskey daily. He also has a known history of liver cirrhosis with varices. This evening started vomiting copious amounts of blood and having black tarry stools. He came to the ER. He denies abdominal pain, he denies any prior variceal bleeding. The hospitalist have been asked to admit   VITAL SIGNS:  Blood pressure 146/82, pulse 76, temperature 100 F (37.8 C), temperature source Oral, resp. rate 16, height 5\' 5"  (1.651 m), weight 79.379 kg (175 lb), SpO2 98 %.  I/O:    Intake/Output Summary (Last 24 hours) at 07/30/15 1313 Last data filed at 07/30/15 0310  Gross per 24 hour  Intake 2148.83 ml  Output      0 ml  Net 2148.83 ml    PHYSICAL EXAMINATION:   GENERAL: 65 y.o.-year-old patient lying in the bed with  no acute distress.  EYES: Pupils equal, round, reactive to light and accommodation. No scleral icterus. Conjunctiva pale. HEENT: Head atraumatic, normocephalic. Oropharynx and nasopharynx clear.  NECK: Supple, no jugular venous distention. No thyroid enlargement, no tenderness.  LUNGS: Normal breath sounds bilaterally, no wheezing, rales,rhonchi or crepitation. No use of accessory muscles of respiration.  CARDIOVASCULAR: S1, S2 normal. No murmurs, rubs, or gallops.  ABDOMEN: Soft, nontender, non distended. Bowel sounds normal. No organomegaly or mass.  EXTREMITIES: No pedal edema, cyanosis, or clubbing.  NEUROLOGIC: follows commands power 4/5 all limbs, gait not checked. PSYCHIATRIC: The patient is alert and oriented. SKIN: No obvious rash, lesion, or ulcer.   DATA REVIEW:   CBC  Recent Labs Lab 07/28/15 0615  WBC 3.9  HGB 7.4*  HCT 22.4*  PLT 121*    Chemistries   Recent Labs Lab 07/28/15 0615  07/30/15 0447  NA 135  < > 134*  K 3.5  < > 3.5  CL 111  < > 111  CO2 20*  < > 20*  GLUCOSE 98  < > 115*  BUN 19  < > 9  CREATININE 0.85  < > 0.73  CALCIUM 7.7*  < > 7.9*  MG 1.8  < > 1.7  AST 46*  --   --   ALT 23  --   --   ALKPHOS 58  --   --   BILITOT 0.9  --   --   < > = values in this interval not displayed.  Cardiac Enzymes No results for input(s): TROPONINI in the last 168 hours.  Microbiology Results  Results for orders placed or performed during the hospital encounter of 07/23/15  MRSA PCR Screening     Status: Abnormal   Collection Time: 07/24/15  2:29 AM  Result Value Ref Range Status   MRSA by PCR POSITIVE (A) NEGATIVE Final    Comment:        The GeneXpert MRSA Assay (FDA approved for NASAL specimens only), is one component of a comprehensive MRSA colonization surveillance program. It is not intended to diagnose MRSA infection nor to guide or monitor treatment for MRSA infections. CRITICAL RESULT CALLED TO, READ BACK BY AND VERIFIED  WITH: CALLED TESS THOMAS AT  [id number] ON 07/24/15 RWW     RADIOLOGY:  No results found.  EKG:   Orders placed or performed in visit on 07/23/15  . EKG 12-Lead  . EKG 12-Lead  . EKG 12-Lead      Management plans discussed with the patient, family and they are in agreement.  CODE STATUS:     Code Status Orders        Start     Ordered   07/24/15 0148  Full code   Continuous     07/24/15 0148    Code Status History    Date Active Date Inactive Code Status Order ID Comments User Context   This patient has a current code status but no historical code status.      TOTAL TIME TAKING CARE OF THIS PATIENT: 35 minutes.    Vaughan Basta M.D on 07/30/2015 at 1:13 PM  Between 7am to 6pm - Pager - [phone]  After 6pm go to [email or web address] - password [email or web address] [facility]  Office  [phone]  CC: Primary care physician; Arnette Norris, MD   Note: This dictation was prepared with Dragon dictation along with smaller phrase technology. Any transcriptional errors that result from this process are unintentional.

## 2015-07-30 NOTE — Care Management Important Message (Signed)
Important Message  Patient Details  Name: Jeff Ward MRN: [id number] Date of Birth: October 22, 1950   Medicare Important Message Given:  Yes    Marshell Garfinkel, RN 07/30/2015, 10:44 AM

## 2015-07-30 NOTE — Progress Notes (Signed)
Physical Therapy Evaluation Patient Details Name: Jeff Ward MRN: [id number] DOB: 09/19/1950 Today's Date: 07/30/2015   History of Present Illness  This is a 65 year old gentleman alcoholic. He drinks whiskey daily. He also has a known history of liver cirrhosis with varices. This evening started vomiting copious amounts of blood and having black tarry stools. He came to the ER. He denies abdominal pain, he denies any prior variceal bleeding. The hospitalist have been asked to admit.  Clinical Impression  Pt presents to PT up in recliner, fully dressed and ready for d/c today.  Pt agreeable to working with PT and was able to stand up safely and walk 150' holding IV pole.  Recommend cane for safety as pt reaching to hold onto furniture/wall/IV pole with mobility.  Pt declined steps at this time.  Pt reports he will have support at home.  Educated pt on effects weakness with extended hospitalization, currently 7 days, and to return to activity slowly to regain strength.  Pt voiced understanding.  No further PT needs at this time.    Follow Up Recommendations No PT follow up    Equipment Recommendations  Cane    Recommendations for Other Services       Precautions / Restrictions Precautions Precautions: Fall Precaution Comments: Mod Restrictions Weight Bearing Restrictions: No      Mobility  Bed Mobility               General bed mobility comments: Pt received up in recliner, dressed, ready for discharge  Transfers Overall transfer level: Modified independent Equipment used: None             General transfer comment: Sit<>stand using B arms to push up on chair, slow lift off, but able to complete transfer with Mod I  Ambulation/Gait Ambulation/Gait assistance: Supervision Ambulation Distance (Feet): 150 Feet Assistive device:  (IV pole) Gait Pattern/deviations: Step-through pattern Gait velocity: slow   General Gait Details: Initially unsteady, improved balance  with use of IV pole; slow cadence with no obvious gait deviations or frank LOB  Stairs Stairs:  (pt declined)          Wheelchair Mobility    Modified Rankin (Stroke Patients Only)       Balance Overall balance assessment: Modified Independent                                           Pertinent Vitals/Pain Pain Assessment: No/denies pain    Home Living Family/patient expects to be discharged to:: Private residence Living Arrangements: Spouse/significant other Available Help at Discharge: Family;Friend(s);Available PRN/intermittently Type of Home: House Home Access: Stairs to enter   [employer] of Steps: 7 Home Layout: One level Home Equipment: Cane - single point      Prior Function Level of Independence: Independent               Hand Dominance        Extremity/Trunk Assessment   Upper Extremity Assessment: Overall WFL for tasks assessed           Lower Extremity Assessment: Overall WFL for tasks assessed         Communication   Communication: No difficulties  Cognition Arousal/Alertness: Awake/alert Behavior During Therapy: WFL for tasks assessed/performed Overall Cognitive Status: Within Functional Limits for tasks assessed (reports hallucinating last night)  General Comments      Exercises        Assessment/Plan    PT Assessment Patent does not need any further PT services  PT Diagnosis Generalized weakness   PT Problem List    PT Treatment Interventions     PT Goals (Current goals can be found in the Care Plan section) Acute Rehab PT Goals Patient Stated Goal: To go home. PT Goal Formulation: With patient Time For Goal Achievement: 08/06/15 Potential to Achieve Goals: Good    Frequency     Barriers to discharge        Co-evaluation               End of Session   Activity Tolerance: Patient tolerated treatment well Patient left: in chair;with call  bell/phone within reach           Time: 1335-1355 PT Time Calculation (min) (ACUTE ONLY): 20 min   Charges:   PT Evaluation $PT Eval Low Complexity: 1 Procedure     PT G Codes:        Mckayla Mulcahey A Malonie Tatum, PT 07/30/2015, 2:08 PM

## 2015-08-01 ENCOUNTER — Telehealth: Payer: Self-pay | Admitting: Gastroenterology

## 2015-08-01 NOTE — Telephone Encounter (Signed)
Patient with significant variceal bleed while admitted at [facility].  He will come in 08/06/15 and see Alonza Bogus, PA at 3:00

## 2015-08-06 ENCOUNTER — Ambulatory Visit (INDEPENDENT_AMBULATORY_CARE_PROVIDER_SITE_OTHER): Payer: Medicare Other | Admitting: Gastroenterology

## 2015-08-06 ENCOUNTER — Ambulatory Visit: Payer: Medicare Other | Admitting: Gastroenterology

## 2015-08-06 ENCOUNTER — Other Ambulatory Visit (INDEPENDENT_AMBULATORY_CARE_PROVIDER_SITE_OTHER): Payer: Medicare Other

## 2015-08-06 ENCOUNTER — Encounter: Payer: Self-pay | Admitting: Gastroenterology

## 2015-08-06 VITALS — BP 132/60 | HR 76 | Ht 65.0 in | Wt 176.1 lb

## 2015-08-06 DIAGNOSIS — K7030 Alcoholic cirrhosis of liver without ascites: Secondary | ICD-10-CM

## 2015-08-06 DIAGNOSIS — I8501 Esophageal varices with bleeding: Secondary | ICD-10-CM

## 2015-08-06 DIAGNOSIS — K7031 Alcoholic cirrhosis of liver with ascites: Secondary | ICD-10-CM | POA: Insufficient documentation

## 2015-08-06 LAB — CBC WITH DIFFERENTIAL/PLATELET
BASOS PCT: 0.8 % (ref 0.0–3.0)
Basophils Absolute: 0 10*3/uL (ref 0.0–0.1)
EOS PCT: 2.3 % (ref 0.0–5.0)
Eosinophils Absolute: 0.1 10*3/uL (ref 0.0–0.7)
HCT: 24.3 % — ABNORMAL LOW (ref 39.0–52.0)
Hemoglobin: 8 g/dL — CL (ref 13.0–17.0)
LYMPHS ABS: 1.7 10*3/uL (ref 0.7–4.0)
Lymphocytes Relative: 31 % (ref 12.0–46.0)
MCHC: 32.7 g/dL (ref 30.0–36.0)
MCV: 88.6 fl (ref 78.0–100.0)
MONOS PCT: 17.3 % — AB (ref 3.0–12.0)
Monocytes Absolute: 0.9 10*3/uL (ref 0.1–1.0)
NEUTROS ABS: 2.7 10*3/uL (ref 1.4–7.7)
NEUTROS PCT: 48.6 % (ref 43.0–77.0)
PLATELETS: 292 10*3/uL (ref 150.0–400.0)
RBC: 2.75 Mil/uL — ABNORMAL LOW (ref 4.22–5.81)
RDW: 18.7 % — AB (ref 11.5–15.5)
WBC: 5.5 10*3/uL (ref 4.0–10.5)

## 2015-08-06 LAB — COMPREHENSIVE METABOLIC PANEL
ALT: 26 U/L (ref 0–53)
AST: 47 U/L — ABNORMAL HIGH (ref 0–37)
Albumin: 3.5 g/dL (ref 3.5–5.2)
Alkaline Phosphatase: 91 U/L (ref 39–117)
BUN: 13 mg/dL (ref 6–23)
CALCIUM: 9.6 mg/dL (ref 8.4–10.5)
CO2: 25 meq/L (ref 19–32)
Chloride: 105 mEq/L (ref 96–112)
Creatinine, Ser: 0.84 mg/dL (ref 0.40–1.50)
GFR: 97.17 mL/min (ref >60.00)
GLUCOSE: 88 mg/dL (ref 70–99)
POTASSIUM: 4.6 meq/L (ref 3.5–5.1)
Sodium: 134 mEq/L — ABNORMAL LOW (ref 135–145)
Total Bilirubin: 0.7 mg/dL (ref 0.2–1.2)
Total Protein: 7.4 g/dL (ref 6.0–8.3)

## 2015-08-06 LAB — PROTIME-INR
INR: 1.4 ratio — ABNORMAL HIGH (ref 0.8–1.0)
PROTHROMBIN TIME: 15 s — AB (ref 9.6–13.1)

## 2015-08-06 MED ORDER — NADOLOL 20 MG PO TABS: 20.0000 mg | ORAL_TABLET | Freq: Every day | ORAL | Status: DC

## 2015-08-06 MED ORDER — NADOLOL 20 MG PO TABS
20.0000 mg | ORAL_TABLET | Freq: Every day | ORAL | Status: DC
Start: 2015-08-06 — End: 2015-12-04

## 2015-08-06 NOTE — Progress Notes (Signed)
Reviewed and agree with management plan.  Brayen Bunn T. Zoa Dowty, MD FACG 

## 2015-08-06 NOTE — Patient Instructions (Addendum)
Please go to the basement level to have your labs drawn.  We sent a prescription  To [facility] for Nadolol 20 mg, take 1 tab daily.  Discontinue the Cipro twice a week.   You have been scheduled for an endoscopy. Please follow written instructions given to you at your visit today. If you use inhalers (even only as needed), please bring them with you on the day of your procedure. Your physician has requested that you go to www.startemmi.com and enter the access code given to you at your visit today. This web site gives a general overview about your procedure. However, you should still follow specific instructions given to you by our office regarding your preparation for the procedure.

## 2015-08-06 NOTE — Progress Notes (Signed)
     08/06/2015 Polk A Yott [id number] [birth year]   History of Present Illness:  This is a 65 year old male with alcohol cirrhosis who follows with Dr. Fuller Plan. Last seen here in May. Has been complicated by esophageal varices, hepatic encephalopathy, and ascites. He was recently hospitalized at [facility] for upper GI bleed on June 26. He underwent EGD on June 27 by Dr. Allen Norris where he was found to have grade 3 bleeding esophageal varices, incompletely eradicated/banded with clotted blood in the entire stomach. It appears that he was given octreotide, PPI drip, and Rocephin antibiotics while he was there. Received at least 2 units of packed red blood cells. He is now on Cipro 500 mg twice a day that appears that they placed him on for SBP prophylaxis. He is on iron sulfate twice a day for this is making his stools dark in color.  No more hematemesis.  He continues his lactulose and his Xifaxan along with twice a day PPI and his diuretics. Does not appear that he is on any type of beta blocker. His discharge hemoglobin was 7.4 g.  Patient was drinking up until the day of his hospital admission on 07/23/2015.  His weight is down 13 pounds since his last visit.  Current Medications, Allergies, Past Medical History, Past Surgical History, Family History and Social History were reviewed in [employer] record.   Physical Exam: BP 132/60 mmHg  Pulse 76  Ht 5\' 5"  (1.651 m)  Wt 176 lb 2 oz (79.89 kg)  BMI 29.31 kg/m2 General: Well developed white male in no acute distress Head: Normocephalic and atraumatic Eyes:  Sclerae anicteric, conjunctiva pink  Ears: Normal auditory acuity Lungs: Clear throughout to auscultation Heart: Regular rate and rhythm Abdomen: Soft, mild abdominal distention.  Normal bowel sounds.  Non-tender.  Diastasis recti noted. Musculoskeletal: Symmetrical with no gross deformities  Extremities: No edema  Neurological: Alert oriented x  4, grossly non-focal Psychological:  Alert and cooperative. Normal mood and affect  Assessment and Recommendations: -65 year old male with alcoholic cirrhosis. Has been drinking up until recent hospital admission for upper GI bleed secondary to esophageal varices. Grade 3 bleeding varices seen on EGD but were not completely eradicated. Patient needs repeat EGD so we will schedule that with Dr. Fuller Plan. We will place on nadolol 20 mg daily. We will recheck labs today including CBC, CMP, PT/INR. We will discontinue his Cipro 500 mg twice weekly as he does not appear to have any history of SBP. Continue all other medications as he is currently taking them.  Patient again advised on the vitality of ETOH abstinence.  *The risks, benefits, and alternatives were discussed with the patient and he consents to proceed.

## 2015-08-08 ENCOUNTER — Ambulatory Visit: Payer: Medicare Other | Admitting: Gastroenterology

## 2015-08-08 ENCOUNTER — Encounter (HOSPITAL_COMMUNITY): Payer: Self-pay | Admitting: *Deleted

## 2015-08-10 ENCOUNTER — Ambulatory Visit
Admission: RE | Admit: 2015-08-10 | Discharge: 2015-08-10 | Disposition: A | Discharge status: Home / Self Care | Payer: Medicare Other | Source: Ambulatory Visit | Attending: Otolaryngology | Admitting: Otolaryngology

## 2015-08-10 DIAGNOSIS — H9319 Tinnitus, unspecified ear: Secondary | ICD-10-CM | POA: Diagnosis not present

## 2015-08-10 DIAGNOSIS — I639 Cerebral infarction, unspecified: Secondary | ICD-10-CM | POA: Diagnosis not present

## 2015-08-10 DIAGNOSIS — R42 Dizziness and giddiness: Secondary | ICD-10-CM | POA: Diagnosis not present

## 2015-08-10 MED ORDER — GADOBENATE DIMEGLUMINE 529 MG/ML IV SOLN
20.0000 mL | Freq: Once | INTRAVENOUS | Status: AC | PRN
  Administered 2015-08-10: 16 mL via INTRAVENOUS

## 2015-08-14 ENCOUNTER — Encounter (HOSPITAL_COMMUNITY): Payer: Self-pay | Admitting: *Deleted

## 2015-08-14 ENCOUNTER — Ambulatory Visit (HOSPITAL_COMMUNITY): Payer: Medicare Other | Admitting: Anesthesiology

## 2015-08-14 ENCOUNTER — Ambulatory Visit (HOSPITAL_COMMUNITY)
Admission: RE | Admit: 2015-08-14 | Discharge: 2015-08-14 | Disposition: A | Discharge status: Home / Self Care | Payer: Medicare Other | Source: Ambulatory Visit | Attending: Gastroenterology | Admitting: Gastroenterology

## 2015-08-14 ENCOUNTER — Encounter (HOSPITAL_COMMUNITY)
Admission: RE | Disposition: A | Discharge status: Home / Self Care | Payer: Self-pay | Source: Ambulatory Visit | Attending: Gastroenterology

## 2015-08-14 DIAGNOSIS — D649 Anemia, unspecified: Secondary | ICD-10-CM | POA: Insufficient documentation

## 2015-08-14 DIAGNOSIS — K2980 Duodenitis without bleeding: Secondary | ICD-10-CM | POA: Diagnosis not present

## 2015-08-14 DIAGNOSIS — Z87891 Personal history of nicotine dependence: Secondary | ICD-10-CM | POA: Diagnosis not present

## 2015-08-14 DIAGNOSIS — I8501 Esophageal varices with bleeding: Secondary | ICD-10-CM | POA: Diagnosis not present

## 2015-08-14 DIAGNOSIS — K219 Gastro-esophageal reflux disease without esophagitis: Secondary | ICD-10-CM | POA: Insufficient documentation

## 2015-08-14 DIAGNOSIS — Z79899 Other long term (current) drug therapy: Secondary | ICD-10-CM | POA: Diagnosis not present

## 2015-08-14 DIAGNOSIS — K7030 Alcoholic cirrhosis of liver without ascites: Secondary | ICD-10-CM | POA: Insufficient documentation

## 2015-08-14 DIAGNOSIS — I10 Essential (primary) hypertension: Secondary | ICD-10-CM | POA: Diagnosis not present

## 2015-08-14 DIAGNOSIS — I8500 Esophageal varices without bleeding: Secondary | ICD-10-CM

## 2015-08-14 DIAGNOSIS — K7460 Unspecified cirrhosis of liver: Secondary | ICD-10-CM | POA: Diagnosis not present

## 2015-08-14 DIAGNOSIS — I8510 Secondary esophageal varices without bleeding: Secondary | ICD-10-CM | POA: Insufficient documentation

## 2015-08-14 HISTORY — DX: Psoriasis, unspecified: L40.9

## 2015-08-14 HISTORY — PX: ESOPHAGOGASTRODUODENOSCOPY (EGD) WITH PROPOFOL: SHX5813

## 2015-08-14 HISTORY — DX: Personal history of urinary calculi: Z87.442

## 2015-08-14 SURGERY — ESOPHAGOGASTRODUODENOSCOPY (EGD) WITH PROPOFOL
Anesthesia: Monitor Anesthesia Care

## 2015-08-14 MED ORDER — PROPOFOL 10 MG/ML IV BOLUS
INTRAVENOUS | Status: AC
  Filled 2015-08-14: qty 40

## 2015-08-14 MED ORDER — PROPOFOL 10 MG/ML IV BOLUS
INTRAVENOUS | Status: DC | PRN
  Administered 2015-08-14: 20 mg via INTRAVENOUS
  Administered 2015-08-14: 30 mg via INTRAVENOUS

## 2015-08-14 MED ORDER — LACTATED RINGERS IV SOLN
INTRAVENOUS | Status: DC
  Administered 2015-08-14: 09:00:00 via INTRAVENOUS

## 2015-08-14 MED ORDER — LIDOCAINE HCL (CARDIAC) 20 MG/ML IV SOLN
INTRAVENOUS | Status: AC
  Filled 2015-08-14: qty 5

## 2015-08-14 MED ORDER — LIDOCAINE HCL (CARDIAC) 20 MG/ML IV SOLN
INTRAVENOUS | Status: DC | PRN
  Administered 2015-08-14: 50 mg via INTRAVENOUS

## 2015-08-14 MED ORDER — SODIUM CHLORIDE 0.9 % IV SOLN: INTRAVENOUS | Status: DC

## 2015-08-14 MED ORDER — ONDANSETRON HCL 4 MG/2ML IJ SOLN
INTRAMUSCULAR | Status: AC
  Filled 2015-08-14: qty 2

## 2015-08-14 MED ORDER — ONDANSETRON HCL 4 MG/2ML IJ SOLN
INTRAMUSCULAR | Status: DC | PRN
  Administered 2015-08-14: 4 mg via INTRAVENOUS

## 2015-08-14 MED ORDER — PROPOFOL 500 MG/50ML IV EMUL
INTRAVENOUS | Status: DC | PRN
  Administered 2015-08-14: 125 ug/kg/min via INTRAVENOUS

## 2015-08-14 SURGICAL SUPPLY — 15 items

## 2015-08-14 NOTE — Transfer of Care (Signed)
Immediate Anesthesia Transfer of Care Note  Patient: Jeff Ward  Procedure(s) Performed: Procedure(s): ESOPHAGOGASTRODUODENOSCOPY (EGD) WITH PROPOFOL (N/A)  Patient Location: PACU  Anesthesia Type:MAC  Level of Consciousness:  sedated, patient cooperative and responds to stimulation  Airway & Oxygen Therapy:Patient Spontanous Breathing and Patient connected to face mask oxgen  Post-op Assessment:  Report given to PACU RN and Post -op Vital signs reviewed and stable  Post vital signs:  Reviewed and stable  Last Vitals:  Filed Vitals:   08/14/15 0823  BP: 152/61  Pulse: 61  Temp: 36.7 C  Resp: 19    Complications: No apparent anesthesia complications

## 2015-08-14 NOTE — H&P (View-Only) (Signed)
     08/06/2015 Drake A Saraceni [id number] January 20, 1951   History of Present Illness:  This is a 65 year old male with alcohol cirrhosis who follows with Dr. Fuller Plan. Last seen here in May. Has been complicated by esophageal varices, hepatic encephalopathy, and ascites. He was recently hospitalized at [facility] for upper GI bleed on June 26. He underwent EGD on June 27 by Dr. Allen Norris where he was found to have grade 3 bleeding esophageal varices, incompletely eradicated/banded with clotted blood in the entire stomach. It appears that he was given octreotide, PPI drip, and Rocephin antibiotics while he was there. Received at least 2 units of packed red blood cells. He is now on Cipro 500 mg twice a day that appears that they placed him on for SBP prophylaxis. He is on iron sulfate twice a day for this is making his stools dark in color.  No more hematemesis.  He continues his lactulose and his Xifaxan along with twice a day PPI and his diuretics. Does not appear that he is on any type of beta blocker. His discharge hemoglobin was 7.4 g.  Patient was drinking up until the day of his hospital admission on 07/23/2015.  His weight is down 13 pounds since his last visit.  Current Medications, Allergies, Past Medical History, Past Surgical History, Family History and Social History were reviewed in [employer] record.   Physical Exam: BP 132/60 mmHg  Pulse 76  Ht 5\' 5"  (1.651 m)  Wt 176 lb 2 oz (79.89 kg)  BMI 29.31 kg/m2 General: Well developed white male in no acute distress Head: Normocephalic and atraumatic Eyes:  Sclerae anicteric, conjunctiva pink  Ears: Normal auditory acuity Lungs: Clear throughout to auscultation Heart: Regular rate and rhythm Abdomen: Soft, mild abdominal distention.  Normal bowel sounds.  Non-tender.  Diastasis recti noted. Musculoskeletal: Symmetrical with no gross deformities  Extremities: No edema  Neurological: Alert oriented x  4, grossly non-focal Psychological:  Alert and cooperative. Normal mood and affect  Assessment and Recommendations: -65 year old male with alcoholic cirrhosis. Has been drinking up until recent hospital admission for upper GI bleed secondary to esophageal varices. Grade 3 bleeding varices seen on EGD but were not completely eradicated. Patient needs repeat EGD so we will schedule that with Dr. Fuller Plan. We will place on nadolol 20 mg daily. We will recheck labs today including CBC, CMP, PT/INR. We will discontinue his Cipro 500 mg twice weekly as he does not appear to have any history of SBP. Continue all other medications as he is currently taking them.  Patient again advised on the vitality of ETOH abstinence.  *The risks, benefits, and alternatives were discussed with the patient and he consents to proceed.

## 2015-08-14 NOTE — Op Note (Signed)
[facility] Patient Name: Jeff Ward Procedure Date: 08/14/2015 MRN: [id number] Attending MD: Ladene Artist , MD Date of Birth: 1950/11/28 CSN: [id number] Age: 65 Admit Type: Inpatient Procedure:                Upper GI endoscopy Indications:              For therapy of esophageal varices Providers:                Pricilla Riffle. Fuller Plan, MD, Elmer Ramp. Tilden Dome, RN, Elspeth Cho Tech., Technician, Marla Roe, CRNA Referring MD:             Marciano Sequin. Deborra Medina, MD Medicines:                Monitored Anesthesia Care Complications:            No immediate complications. Estimated Blood Loss:     Estimated blood loss: none. Procedure:                Pre-Anesthesia Assessment:                           - Prior to the procedure, a History and Physical                            was performed, and patient medications and                            allergies were reviewed. The patient's tolerance of                            previous anesthesia was also reviewed. The risks                            and benefits of the procedure and the sedation                            options and risks were discussed with the patient.                            All questions were answered, and informed consent                            was obtained. Prior Anticoagulants: The patient has                            taken no previous anticoagulant or antiplatelet                            agents. ASA Grade Assessment: III - A patient with                            severe systemic disease. After reviewing the risks  and benefits, the patient was deemed in                            satisfactory condition to undergo the procedure.                           After obtaining informed consent, the endoscope was                            passed under direct vision. Throughout the                            procedure, the patient's blood pressure,  pulse, and                            oxygen saturations were monitored continuously. The                            [id number] scope was introduced through the                            mouth, and advanced to the second part of duodenum.                            The upper GI endoscopy was accomplished without                            difficulty. The patient tolerated the procedure                            well. Scope In: Scope Out: Findings:      Two columns of non-bleeding grade II and grade I varices were found in       the distal esophagus. No stigmata of recent bleeding were evident and no       red wale signs were present. Scarring from prior treatment was visible.       Evidence of partial eradication was visible. Three bands were       successfully placed with complete eradication, resulting in deflation of       varices. 2 bands on the grade II column and 1 band on the grade I       column. There was no bleeding during the maneuver.      The exam of the esophagus was otherwise normal.      The entire examined stomach was normal. No gastric varices noted.      The duodenal bulb and second portion of the duodenum were normal. Impression:               - Non-bleeding grade II and grade I esophageal                            varices. Banded. Completely eradicated.                           - Normal stomach.                           -  Normal duodenal bulb and second portion of the                            duodenum.                           - No specimens collected. Moderate Sedation:      N/A- Per Anesthesia Care Recommendation:           - Patient has a contact number available for                            emergencies. The signs and symptoms of potential                            delayed complications were discussed with the                            patient. Return to normal activities tomorrow.                            Written discharge instructions  were provided to the                            patient.                           - Resume previous diet.                           - Continue present medications.                           - Repeat upper endoscopy in 1 year for surveillance.                           - Return to GI clinic in 3 months Procedure Code(s):        --- Professional ---                           [phone], Esophagogastroduodenoscopy, flexible,                            transoral; with band ligation of esophageal/gastric                            varices Diagnosis Code(s):        --- Professional ---                           I85.00, Esophageal varices without bleeding CPT copyright 2016 American Medical Association. All rights reserved. The codes documented in this report are preliminary and upon coder review may  be revised to meet current compliance requirements. Ladene Artist, MD 08/14/2015 9:37:22 AM This report has been signed electronically. Number of Addenda: 0

## 2015-08-14 NOTE — Discharge Instructions (Signed)

## 2015-08-14 NOTE — Anesthesia Postprocedure Evaluation (Signed)
Anesthesia Post Note  Patient: Jeff Ward  Procedure(s) Performed: Procedure(s) (LRB): ESOPHAGOGASTRODUODENOSCOPY (EGD) WITH PROPOFOL (N/A)  Patient location during evaluation: Endoscopy Anesthesia Type: MAC Level of consciousness: awake and alert, oriented and patient cooperative Pain management: pain level controlled Vital Signs Assessment: post-procedure vital signs reviewed and stable Respiratory status: spontaneous breathing, nonlabored ventilation and respiratory function stable Cardiovascular status: blood pressure returned to baseline and stable Postop Assessment: no signs of nausea or vomiting Anesthetic complications: no    Last Vitals:  Filed Vitals:   08/14/15 0823 08/14/15 0934  BP: 152/61 176/76  Pulse: 61 71  Temp: 36.7 C 36.8 C  Resp: 19 20    Last Pain: There were no vitals filed for this visit.               Midge Minium

## 2015-08-14 NOTE — Interval H&P Note (Signed)
History and Physical Interval Note:  08/14/2015 9:10 AM  Jeff Ward  has presented today for surgery, with the diagnosis of cirrhosis, esophageal varices with bleeding.  The various methods of treatment have been discussed with the patient and family. After consideration of risks, benefits and other options for treatment, the patient has consented to  Procedure(s): ESOPHAGOGASTRODUODENOSCOPY (EGD) WITH PROPOFOL (N/A) as a surgical intervention .  The patient's history has been reviewed, patient examined, no change in status, stable for surgery.  I have reviewed the patient's chart and labs.  Questions were answered to the patient's satisfaction.     Pricilla Riffle. Fuller Plan

## 2015-08-14 NOTE — Anesthesia Preprocedure Evaluation (Addendum)
Anesthesia Evaluation    Airway Mallampati: II   Neck ROM: Full    Dental  (+) Partial Lower, Partial Upper   Pulmonary former smoker (quit 2012),    Pulmonary exam normal        Cardiovascular hypertension, Pt. on medications and Pt. on home beta blockers  Rhythm:Regular     Neuro/Psych    GI/Hepatic GERD  Medicated,(+) Cirrhosis   Esophageal Varices  substance abuse  alcohol use,   Endo/Other    Renal/GU      Musculoskeletal   Abdominal   Peds  Hematology  (+) Blood dyscrasia (Hb 8.0), anemia ,   Anesthesia Other Findings   Reproductive/Obstetrics                           Anesthesia Physical Anesthesia Plan  ASA: III  Anesthesia Plan: MAC   Post-op Pain Management:    Induction:   Airway Management Planned:   Additional Equipment:   Intra-op Plan:   Post-operative Plan:   Informed Consent:   Plan Discussed with:   Anesthesia Plan Comments:         Anesthesia Quick Evaluation

## 2015-08-15 ENCOUNTER — Encounter (HOSPITAL_COMMUNITY): Payer: Self-pay | Admitting: Gastroenterology

## 2015-08-16 ENCOUNTER — Telehealth: Payer: Self-pay | Admitting: Gastroenterology

## 2015-08-16 MED ORDER — FERROUS SULFATE 325 (65 FE) MG PO TABS: 325.0000 mg | ORAL_TABLET | Freq: Two times a day (BID) | ORAL | Status: DC

## 2015-08-16 NOTE — Telephone Encounter (Signed)
Prescription changed to ferrous sulfate 325 mg twice daily. Patient notified of change in prescription. Patient verbalized understanding.

## 2015-08-17 DIAGNOSIS — R42 Dizziness and giddiness: Secondary | ICD-10-CM | POA: Diagnosis not present

## 2015-08-22 ENCOUNTER — Ambulatory Visit (INDEPENDENT_AMBULATORY_CARE_PROVIDER_SITE_OTHER): Payer: Medicare Other | Admitting: Family Medicine

## 2015-08-22 ENCOUNTER — Encounter: Payer: Self-pay | Admitting: Family Medicine

## 2015-08-22 VITALS — BP 116/60 | HR 51 | Temp 97.9°F | Wt 167.5 lb

## 2015-08-22 DIAGNOSIS — I8511 Secondary esophageal varices with bleeding: Secondary | ICD-10-CM

## 2015-08-22 DIAGNOSIS — K2921 Alcoholic gastritis with bleeding: Secondary | ICD-10-CM | POA: Diagnosis not present

## 2015-08-22 DIAGNOSIS — K7030 Alcoholic cirrhosis of liver without ascites: Secondary | ICD-10-CM

## 2015-08-22 DIAGNOSIS — R42 Dizziness and giddiness: Secondary | ICD-10-CM | POA: Diagnosis not present

## 2015-08-22 LAB — CBC WITH DIFFERENTIAL/PLATELET
BASOS PCT: 0.7 % (ref 0.0–3.0)
Basophils Absolute: 0 10*3/uL (ref 0.0–0.1)
EOS PCT: 4.9 % (ref 0.0–5.0)
Eosinophils Absolute: 0.3 10*3/uL (ref 0.0–0.7)
HEMATOCRIT: 29.9 % — AB (ref 39.0–52.0)
HEMOGLOBIN: 9.6 g/dL — AB (ref 13.0–17.0)
LYMPHS PCT: 31.6 % (ref 12.0–46.0)
Lymphs Abs: 2 10*3/uL (ref 0.7–4.0)
MCHC: 32.3 g/dL (ref 30.0–36.0)
MCV: 89.4 fl (ref 78.0–100.0)
Monocytes Absolute: 0.9 10*3/uL (ref 0.1–1.0)
Monocytes Relative: 13.8 % — ABNORMAL HIGH (ref 3.0–12.0)
Neutro Abs: 3.2 10*3/uL (ref 1.4–7.7)
Neutrophils Relative %: 49 % (ref 43.0–77.0)
Platelets: 225 10*3/uL (ref 150.0–400.0)
RBC: 3.34 Mil/uL — AB (ref 4.22–5.81)
RDW: 19.3 % — AB (ref 11.5–15.5)
WBC: 6.4 10*3/uL (ref 4.0–10.5)

## 2015-08-22 LAB — COMPREHENSIVE METABOLIC PANEL
ALBUMIN: 3.8 g/dL (ref 3.5–5.2)
ALK PHOS: 64 U/L (ref 39–117)
ALT: 28 U/L (ref 0–53)
AST: 44 U/L — ABNORMAL HIGH (ref 0–37)
BILIRUBIN TOTAL: 0.6 mg/dL (ref 0.2–1.2)
BUN: 27 mg/dL — ABNORMAL HIGH (ref 6–23)
CALCIUM: 10.2 mg/dL (ref 8.4–10.5)
CHLORIDE: 104 meq/L (ref 96–112)
CO2: 26 mEq/L (ref 19–32)
CREATININE: 1.28 mg/dL (ref 0.40–1.50)
GFR: 59.76 mL/min — ABNORMAL LOW (ref >60.00)
Glucose, Bld: 112 mg/dL — ABNORMAL HIGH (ref 70–99)
Potassium: 4 mEq/L (ref 3.5–5.1)
Sodium: 136 mEq/L (ref 135–145)
TOTAL PROTEIN: 7.9 g/dL (ref 6.0–8.3)

## 2015-08-22 NOTE — Patient Instructions (Signed)
Great to see you.  We will call you with your lab results. 

## 2015-08-22 NOTE — Assessment & Plan Note (Addendum)
Continue Lactulose and vitamins. Has not had any ETOH since admission. Continue SBP prophylaxis.

## 2015-08-22 NOTE — Progress Notes (Signed)
Subjective:   Patient ID: Jeff Ward, male    DOB: 12/05/1950, 65 y.o.   MRN: [id number]  Jeff Ward is a pleasant 65 y.o. 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 Fuller Plan. Last seen here in May. Has been complicated by esophageal varices, hepatic encephalopathy, and ascites. He was recently hospitalized at [facility] for upper GI bleed on June 26. He underwent EGD on June 27 by Dr. Allen Norris where he was found to have grade 3 bleeding esophageal varices, incompletely eradicated/banded with clotted blood in the entire stomach. It appears that he was given octreotide, PPI drip, and Rocephin antibiotics while he was there. Received at least 2 units of packed red blood cells. He is now on Cipro 500 mg twice a day that appears that they placed him on for SBP prophylaxis. He is on iron sulfate twice a day for this is making his stools dark in color.  No more hematemesis.   S/p EGD with propofol as surgical intervention for varices on 08/14/15. EGD reviewed- varcies banded.  Lab Results  Component Value Date   WBC 5.5 08/06/2015   HGB 8.0 Repeated and verified X2. (LL) 08/06/2015   HCT 24.3 (L) 08/06/2015   MCV 88.6 08/06/2015   PLT 292.0 08/06/2015    Wt Readings from Last 3 Encounters:  08/22/15 167 lb 8 oz (76 kg)  08/14/15 172 lb (78 kg)  08/06/15 176 lb 2 oz (79.9 kg)   Current Outpatient Prescriptions on File Prior to Visit  Medication Sig Dispense Refill  . DULoxetine (CYMBALTA) 30 MG capsule Take 30 mg by mouth every morning.     Marland Kitchen EPINEPHrine (EPIPEN 2-PAK) 0.3 mg/0.3 mL IJ SOAJ injection Inject 0.3 mg into the muscle once as needed (for severe allergic reaction).    . fenofibrate (TRICOR) 145 MG tablet Take 145 mg by mouth every morning.     . ferrous sulfate 325 (65 FE) MG tablet Take 1 tablet (325 mg total) by mouth  2 (two) times daily with a meal. 60 tablet 3  . folic acid (FOLVITE) 1 MG tablet Take 1 tablet (1 mg total) by mouth daily. (Patient taking differently: Take 1 mg by mouth every morning. ) 30 tablet 0  . furosemide (LASIX) 10 MG/ML solution Take 2 mLs by mouth every morning.   6  . glucosamine-chondroitin 500-400 MG tablet Take 1 tablet by mouth 2 (two) times daily.      Marland Kitchen lactulose (CHRONULAC) 10 GM/15ML solution Take 30 mLs (20 g total) by mouth 2 (two) times daily. 240 mL 2  . meclizine (ANTIVERT) 25 MG tablet Take 1 tablet (25 mg total) by mouth daily as needed for dizziness or nausea. 90 tablet 0  . mometasone (ELOCON) 0.1 % lotion Place 4 drops into both ears 2 (two) times daily. For dry skin  0  . Multiple Vitamin (MULTIVITAMIN WITH MINERALS) TABS tablet Take 1 tablet by mouth daily.    . nadolol (CORGARD) 20 MG tablet Take 1 tablet (20 mg total) by mouth daily. 30 tablet 2  . pantoprazole (PROTONIX) 40 MG tablet Take 1 tablet (40 mg total) by mouth 2 (two) times daily. 60 tablet 2  . potassium chloride SA (K-DUR,KLOR-CON) 20 MEQ tablet Take 1 tablet (20 mEq total) by mouth 2 (two) times daily. 60 tablet 0  . rifaximin (  XIFAXAN) 550 MG TABS tablet Take 1 tablet (550 mg total) by mouth 2 (two) times daily. 60 tablet 0  . spironolactone (ALDACTONE) 50 MG tablet Take 2 tablets (100 mg total) by mouth daily. 30 tablet 6  . tetrahydrozoline 0.05 % ophthalmic solution Place 1 drop into both eyes daily.    Marland Kitchen thiamine 100 MG tablet Take 1 tablet (100 mg total) by mouth 2 (two) times daily. 60 tablet 0  . TWINRIX 720-20 injection Inject 1 mL into the muscle See admin instructions. 3 doses : 2nd dose given 1 month from 1st dose and 3rd dose given 6 months from 1st dose     No current facility-administered medications on file prior to visit.     Allergies  Allergen Reactions  . Bee Venom Anaphylaxis  . Sulfa Antibiotics Other (See Comments)    Reaction:  Fainting     Past Medical History:    Diagnosis Date  . Arthritis    In back and all joints  . Chronic headache    now resolved  . 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 Depression   . Esophageal varices ([place])   . GERD (gastroesophageal reflux disease)   . GI bleed   . Hiatal hernia   . History of kidney stones 1970's  . Hyperlipidemia 06/2002  . Hypertension   . Psoriasis     Past Surgical History:  Procedure Laterality Date  . CARPAL TUNNEL RELEASE  2005    / right hand  . ESOPHAGOGASTRODUODENOSCOPY N/A 07/24/2015   Procedure: ESOPHAGOGASTRODUODENOSCOPY (EGD);  Surgeon: Lucilla Lame, MD;  Location: [facility] ENDOSCOPY;  Service: Endoscopy;  Laterality: N/A;  Banding  . ESOPHAGOGASTRODUODENOSCOPY (EGD) WITH PROPOFOL N/A 08/14/2015   Procedure: ESOPHAGOGASTRODUODENOSCOPY (EGD) WITH PROPOFOL;  Surgeon: Ladene Artist, MD;  Location: [facility] ENDOSCOPY;  Service: Endoscopy;  Laterality: N/A;  . TONSILLECTOMY AND ADENOIDECTOMY     65 years old  . VASECTOMY      Family History  Problem Relation Age of Onset  . Melanoma Father   . Breast cancer Paternal Aunt   . Alcohol abuse Maternal Grandfather     Social History   Social History  . Marital status: Married    Spouse name: N/A  . Number of children: 2  . Years of education: N/A   Occupational History  . [employer]-Maintence     currently unemployed  .  [place]   Social History Main Topics  . Smoking status: Former Smoker    Years: 35.00    Types: Cigars    Quit date: 04/02/2010  . Smokeless tobacco: Never Used     Comment: smoked cigars 1 to 2 daily  . Alcohol use No  . Drug use: No  . Sexual activity: Yes    Partners: Female     Comment: declined condoms   Other Topics Concern  . Not on file   Social History Narrative  . No narrative on file   The PMH, PSH, Social History, Family History, Medications, and allergies have been reviewed in [facility], and have been updated if relevant.  Review of Systems   Constitutional: Positive for fatigue and unexpected weight change. Negative for fever.  Respiratory: Negative.   Cardiovascular: Negative.   Gastrointestinal: Positive for abdominal distention and abdominal pain. Negative for blood in stool, constipation, diarrhea, nausea, rectal pain and vomiting.  Neurological: Positive for weakness and light-headedness. Negative for tremors and syncope.       Objective:  BP 116/60   Pulse (!) 51   Temp 97.9 F (36.6 C) (Oral)   Wt 167 lb 8 oz (76 kg)   SpO2 99%   BMI 27.87 kg/m  BP Readings from Last 3 Encounters:  08/22/15 116/60  08/14/15 (!) 168/62  08/06/15 132/60     Physical Exam  Constitutional: He is oriented to person, place, and time. He appears well-developed. No distress.  Eyes: Conjunctivae are normal.  Pulmonary/Chest: Effort normal.  Abdominal: He exhibits distension. There is tenderness. There is no rebound and no guarding.  Neurological: He is alert and oriented to person, place, and time. No cranial nerve deficit.  No tremor  Skin: Skin is warm and dry. He is not diaphoretic.  Psychiatric: He has a normal mood and affect. His behavior is normal. Judgment and thought content normal.          Assessment & Plan:   Alcoholic cirrhosis of liver without ascites (HCC)  Alcoholic gastritis with hemorrhage  Secondary esophageal varices with bleeding ([place]) - Plan: CBC with Differential/Platelet, Comprehensive metabolic panel No Follow-up on file.

## 2015-08-22 NOTE — Assessment & Plan Note (Signed)
S/p banding.  Continue iron. Check CBC, CMET today.

## 2015-08-22 NOTE — Progress Notes (Signed)
Pre visit review using our clinic review tool, if applicable. No additional management support is needed unless otherwise documented below in the visit note. 

## 2015-08-23 ENCOUNTER — Encounter: Payer: Self-pay | Admitting: *Deleted

## 2015-09-04 ENCOUNTER — Telehealth: Payer: Self-pay | Admitting: Family Medicine

## 2015-09-04 NOTE — Telephone Encounter (Signed)
Left message asking pt to call office. I received paperwork from [facility] I have questions about leave

## 2015-09-05 NOTE — Telephone Encounter (Signed)
Left message asking pt to call office  °

## 2015-09-06 NOTE — Telephone Encounter (Signed)
Left message asking pt to call office  °

## 2015-09-06 NOTE — Telephone Encounter (Signed)
Spouse called back stating she would have pt to call me

## 2015-09-06 NOTE — Telephone Encounter (Signed)
Left message asking spouse to call office °

## 2015-09-07 ENCOUNTER — Telehealth: Payer: Self-pay | Admitting: Family Medicine

## 2015-09-07 NOTE — Telephone Encounter (Signed)
Pt has appt with Dr Deborra Medina on 09/10/15 at 10:15.

## 2015-09-07 NOTE — Telephone Encounter (Signed)
Spoke with pt  Pt stated he had surgery 08/24/15  Paperwork in Dr Deborra Medina  In [place] For completion

## 2015-09-07 NOTE — Telephone Encounter (Signed)
Patient Name: Hosteen Severns  DOB: Jan 18, 1951    Initial Comment caller states he was in such a deep sleep he couldn't be woken up for 3 days   Nurse Assessment  Nurse: Mallie Mussel, RN, Alveta Heimlich Date/Time Eilene Ghazi Time): 09/07/2015 11:00:40 AM  Confirm and document reason for call. If symptomatic, describe symptoms. You must click the next button to save text entered. ---Caller states that he had been in such a deep sleep that his wife couldn't wake him up for 3 days. Denies any new medications. He cannot think of a reason why this happened. Denies unilateral weakness and numbness. He had not taken any medication to help him to sleep. This has never happened to him before. Denies depression. Denies any symptoms at this time other than he was asleep for 3 days. He states that he did not overdose on any of his medications. Since he doesn't have any symptoms at this time, and we do not have a guideline for this, I recommended he be seen within the next 3 days. He verbalized understanding.  Has the patient traveled out of the country within the last 30 days? ---No  Does the patient have any new or worsening symptoms? ---Yes  Will a triage be completed? ---Yes  Related visit to physician within the last 2 weeks? ---No  Does the PT have any chronic conditions? (i.e. diabetes, asthma, etc.) ---Yes  List chronic conditions. ---HTN, States he has about 15 pills he takes, when asked the names of the meds, he replies "I don't know"  Is this a behavioral health or substance abuse call? ---No     Guidelines    Guideline Title Affirmed Question Affirmed Notes       Final Disposition User        Comments  I was able to schedule an appointment with Dr. Arnette Norris for Monday the 14th at 10:45am for him. I also advised him that if he gets worse or develops any new symptoms to call us back. He verbalized understanding.

## 2015-09-10 ENCOUNTER — Encounter: Payer: Self-pay | Admitting: Family Medicine

## 2015-09-10 ENCOUNTER — Ambulatory Visit (INDEPENDENT_AMBULATORY_CARE_PROVIDER_SITE_OTHER): Payer: Medicare Other | Admitting: Family Medicine

## 2015-09-10 VITALS — BP 132/74 | HR 59 | Temp 98.0°F | Wt 164.2 lb

## 2015-09-10 DIAGNOSIS — K922 Gastrointestinal hemorrhage, unspecified: Secondary | ICD-10-CM

## 2015-09-10 DIAGNOSIS — G4710 Hypersomnia, unspecified: Secondary | ICD-10-CM

## 2015-09-10 DIAGNOSIS — K7030 Alcoholic cirrhosis of liver without ascites: Secondary | ICD-10-CM | POA: Diagnosis not present

## 2015-09-10 DIAGNOSIS — R634 Abnormal weight loss: Secondary | ICD-10-CM | POA: Insufficient documentation

## 2015-09-10 DIAGNOSIS — Z7689 Persons encountering health services in other specified circumstances: Secondary | ICD-10-CM

## 2015-09-10 DIAGNOSIS — R400 Somnolence: Secondary | ICD-10-CM | POA: Insufficient documentation

## 2015-09-10 LAB — COMPREHENSIVE METABOLIC PANEL
ALBUMIN: 4 g/dL (ref 3.5–5.2)
ALT: 21 U/L (ref 0–53)
AST: 37 U/L (ref 0–37)
Alkaline Phosphatase: 47 U/L (ref 39–117)
BUN: 22 mg/dL (ref 6–23)
CALCIUM: 10.1 mg/dL (ref 8.4–10.5)
CHLORIDE: 109 meq/L (ref 96–112)
CO2: 23 meq/L (ref 19–32)
Creatinine, Ser: 1.14 mg/dL (ref 0.40–1.50)
GFR: 68.29 mL/min (ref >60.00)
Glucose, Bld: 101 mg/dL — ABNORMAL HIGH (ref 70–99)
POTASSIUM: 4.1 meq/L (ref 3.5–5.1)
SODIUM: 139 meq/L (ref 135–145)
Total Bilirubin: 0.6 mg/dL (ref 0.2–1.2)
Total Protein: 7.6 g/dL (ref 6.0–8.3)

## 2015-09-10 LAB — CBC
MCHC: 33.5 g/dL (ref 30.0–36.0)
MCV: 87.2 fl (ref 78.0–100.0)
Platelets: 175 10*3/uL (ref 150.0–400.0)
RBC: 2.8 Mil/uL — AB (ref 4.22–5.81)
RDW: 19 % — ABNORMAL HIGH (ref 11.5–15.5)
WBC: 4.1 10*3/uL (ref 4.0–10.5)

## 2015-09-10 LAB — VITAMIN B12: Vitamin B-12: 663 pg/mL (ref 211–911)

## 2015-09-10 NOTE — Progress Notes (Signed)
Subjective:   Patient ID: Jeff Ward, male    DOB: 04-Sep-1950, 65 y.o.   MRN: [id number]  Jeff Ward is a pleasant 65 y.o. year old male who presents to clinic today with his son for Fatigue (only awake 3-4hrs a day)  on 09/10/2015  HPI:  Recent diagnosis ofesophageal varices, hepatic encephalopathy, and ascites. He was recently hospitalized at [facility] for upper GI bleed on June 26. He underwent EGD on June 27 by Dr. Allen Norris where he was found to have grade 3 bleeding esophageal varices, incompletely eradicated/banded with clotted blood in the entire stomach. It appears that he was given octreotide, PPI drip, and Rocephin antibiotics while he was there. Received at least 2 units of packed red blood cells. He is now on Cipro 500 mg twice a day that appears that they placed him on for SBP prophylaxis. He is on iron sulfate twice a day for this is making his stools dark in color. No more hematemesis.   S/p EGD with propofol as surgical intervention for varices on 08/14/15. EGD reviewed- varcies banded.  Since discharge, progressive daytime somnolence and weight loss.  His son states this weekend, he was only awake for a 3-4 hours a day although he does think he is taking all of his medications.  No diarrhea from lactulose.  Denies any other symptoms.  No other recurrent symptoms of GI bleed.  Current Outpatient Prescriptions on File Prior to Visit  Medication Sig Dispense Refill  . DULoxetine (CYMBALTA) 30 MG capsule Take 30 mg by mouth every morning.     Marland Kitchen EPINEPHrine (EPIPEN 2-PAK) 0.3 mg/0.3 mL IJ SOAJ injection Inject 0.3 mg into the muscle once as needed (for severe allergic reaction).    . fenofibrate (TRICOR) 145 MG tablet Take 145 mg by mouth every morning.     . ferrous sulfate 325 (65 FE) MG tablet Take 1 tablet (325 mg total) by mouth 2 (two) times daily with a meal. 60 tablet 3  . folic acid (FOLVITE) 1 MG tablet Take 1 tablet (1 mg total) by mouth  daily. (Patient taking differently: Take 1 mg by mouth every morning. ) 30 tablet 0  . furosemide (LASIX) 10 MG/ML solution Take 2 mLs by mouth every morning.   6  . glucosamine-chondroitin 500-400 MG tablet Take 1 tablet by mouth 2 (two) times daily.      Marland Kitchen lactulose (CHRONULAC) 10 GM/15ML solution Take 30 mLs (20 g total) by mouth 2 (two) times daily. 240 mL 2  . meclizine (ANTIVERT) 25 MG tablet Take 1 tablet (25 mg total) by mouth daily as needed for dizziness or nausea. 90 tablet 0  . mometasone (ELOCON) 0.1 % lotion Place 4 drops into both ears 2 (two) times daily. For dry skin  0  . Multiple Vitamin (MULTIVITAMIN WITH MINERALS) TABS tablet Take 1 tablet by mouth daily.    . nadolol (CORGARD) 20 MG tablet Take 1 tablet (20 mg total) by mouth daily. 30 tablet 2  . pantoprazole (PROTONIX) 40 MG tablet Take 1 tablet (40 mg total) by mouth 2 (two) times daily. 60 tablet 2  . potassium chloride SA (K-DUR,KLOR-CON) 20 MEQ tablet Take 1 tablet (20 mEq total) by mouth 2 (two) times daily. 60 tablet 0  . rifaximin (XIFAXAN) 550 MG TABS tablet Take 1 tablet (550 mg total) by mouth 2 (two) times daily. 60 tablet 0  . spironolactone (ALDACTONE) 50 MG tablet Take 2 tablets (100 mg total)  by mouth daily. 30 tablet 6  . tetrahydrozoline 0.05 % ophthalmic solution Place 1 drop into both eyes daily.    Marland Kitchen thiamine 100 MG tablet Take 1 tablet (100 mg total) by mouth 2 (two) times daily. 60 tablet 0  . TWINRIX 720-20 injection Inject 1 mL into the muscle See admin instructions. 3 doses : 2nd dose given 1 month from 1st dose and 3rd dose given 6 months from 1st dose     No current facility-administered medications on file prior to visit.     Allergies  Allergen Reactions  . Bee Venom Anaphylaxis  . Sulfa Antibiotics Other (See Comments)    Reaction:  Fainting     Past Medical History:  Diagnosis Date  . Arthritis    In back and all joints  . Chronic headache    now resolved  . 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 Depression   . Esophageal varices ([place])   . GERD (gastroesophageal reflux disease)   . GI bleed   . Hiatal hernia   . History of kidney stones 1970's  . Hyperlipidemia 06/2002  . Hypertension   . Psoriasis     Past Surgical History:  Procedure Laterality Date  . CARPAL TUNNEL RELEASE  2005    / right hand  . ESOPHAGOGASTRODUODENOSCOPY N/A 07/24/2015   Procedure: ESOPHAGOGASTRODUODENOSCOPY (EGD);  Surgeon: Lucilla Lame, MD;  Location: [facility] ENDOSCOPY;  Service: Endoscopy;  Laterality: N/A;  Banding  . ESOPHAGOGASTRODUODENOSCOPY (EGD) WITH PROPOFOL N/A 08/14/2015   Procedure: ESOPHAGOGASTRODUODENOSCOPY (EGD) WITH PROPOFOL;  Surgeon: Ladene Artist, MD;  Location: [facility] ENDOSCOPY;  Service: Endoscopy;  Laterality: N/A;  . TONSILLECTOMY AND ADENOIDECTOMY     65 years old  . VASECTOMY      Family History  Problem Relation Age of Onset  . Melanoma Father   . Breast cancer Paternal Aunt   . Alcohol abuse Maternal Grandfather     Social History   Social History  . Marital status: Married    Spouse name: N/A  . Number of children: 2  . Years of education: N/A   Occupational History  . [employer]-Maintence     currently unemployed  .  [place]   Social History Main Topics  . Smoking status: Former Smoker    Years: 35.00    Types: Cigars    Quit date: 04/02/2010  . Smokeless tobacco: Never Used     Comment: smoked cigars 1 to 2 daily  . Alcohol use No  . Drug use: No  . Sexual activity: Yes    Partners: Female     Comment: declined condoms   Other Topics Concern  . Not on file   Social History Narrative  . No narrative on file   The PMH, PSH, Social History, Family History, Medications, and allergies have been reviewed in [facility], and have been updated if relevant.  Review of Systems  Constitutional: Positive for appetite change, fatigue and unexpected weight change.  Respiratory: Negative.     Cardiovascular: Negative.   Musculoskeletal: Negative.   Neurological: Positive for weakness. Negative for dizziness, tremors, seizures, syncope, facial asymmetry, speech difficulty, light-headedness, numbness and headaches.  Hematological: Negative.   Psychiatric/Behavioral: Negative.   All other systems reviewed and are negative.      Objective:    BP 132/74   Pulse (!) 59   Temp 98 F (36.7 C) (Oral)   Wt 164 lb 4 oz (74.5 kg)   SpO2 99%  BMI 27.33 kg/m   Wt Readings from Last 3 Encounters:  09/10/15 164 lb 4 oz (74.5 kg)  08/22/15 167 lb 8 oz (76 kg)  08/14/15 172 lb (78 kg)    Physical Exam  Constitutional: He is oriented to person, place, and time. He appears well-developed and well-nourished. No distress.  HENT:  Head: Normocephalic.  Eyes: Conjunctivae are normal.  Pulmonary/Chest: Effort normal.  Abdominal: Soft. He exhibits no distension. There is no tenderness. There is no rebound.  Musculoskeletal: Normal range of motion.  Neurological: He is alert and oriented to person, place, and time. No cranial nerve deficit.  Skin: Skin is warm and dry. He is not diaphoretic.  Psychiatric: He has a normal mood and affect. His behavior is normal. Judgment and thought content normal.  Nursing note and vitals reviewed.         Assessment & Plan:   UGIB (upper gastrointestinal bleed)  Somnolence, daytime - Plan: Ammonia, Comprehensive metabolic panel, CBC, Vitamin 123456  Alcoholic cirrhosis of liver without ascites (HCC) No Follow-up on file.

## 2015-09-10 NOTE — Progress Notes (Signed)
Pre visit review using our clinic review tool, if applicable. No additional management support is needed unless otherwise documented below in the visit note. 

## 2015-09-10 NOTE — Addendum Note (Signed)
Addended by: Marchia Bond on: 09/10/2015 11:33 AM   Modules accepted: Orders

## 2015-09-10 NOTE — Assessment & Plan Note (Signed)
>  25 minutes spent in face to face time with patient, >50% spent in counselling or coordination of care With weight loss. Will check labs today, including ammonia, CBC and CMET. ? Possibly needs a higher dose of lactulose. The patient indicates understanding of these issues and agrees with the plan.

## 2015-09-10 NOTE — Addendum Note (Signed)
Addended by: Daralene Milch C on: 09/10/2015 11:41 AM   Modules accepted: Orders

## 2015-09-11 LAB — AMMONIA: Ammonia: 100 umol/L — ABNORMAL HIGH (ref <=47)

## 2015-09-11 NOTE — Telephone Encounter (Signed)
Dr Deborra Medina I have marked the papers I need help with In Dr Hulen Shouts IN BOX

## 2015-09-12 ENCOUNTER — Encounter: Payer: Self-pay | Admitting: Family Medicine

## 2015-09-12 ENCOUNTER — Telehealth: Payer: Self-pay | Admitting: Family Medicine

## 2015-09-12 NOTE — Telephone Encounter (Signed)
Patient returned Waynetta's call. °

## 2015-09-12 NOTE — Telephone Encounter (Signed)
Pt returned your call - please call back at [phone] Thank you

## 2015-09-12 NOTE — Telephone Encounter (Signed)
Unfortunately, I do not know the return date ( if ever).  He is quite weak. At this time, he cannot perform ANY of his job functions but that may change in the future.  As for now, he needs to be out of work indefinitely due to severe weakness, daytime somnolence and mental status changes.

## 2015-09-13 NOTE — Telephone Encounter (Signed)
Paperwork faxed °

## 2015-09-13 NOTE — Telephone Encounter (Signed)
Copy for pt Copy for file Copy for scan Copy for billing  Left message letting him know paperwork had been faxed and copy here for him

## 2015-09-14 ENCOUNTER — Ambulatory Visit: Payer: Medicare Other | Attending: Otolaryngology

## 2015-09-14 VITALS — BP 130/65 | HR 67

## 2015-09-14 DIAGNOSIS — R42 Dizziness and giddiness: Secondary | ICD-10-CM | POA: Insufficient documentation

## 2015-09-14 DIAGNOSIS — R2681 Unsteadiness on feet: Secondary | ICD-10-CM | POA: Diagnosis not present

## 2015-09-14 NOTE — Patient Instructions (Signed)
Balance: Eyes Closed - Bilateral (Varied Surfaces)    Stand, feet shoulder as close together as you can, close your eyes. Maintain balance __30__ seconds. Relax and repeat 2 more times. Perform 2 times/day. Once you are able to complete 30 seconds confidently perform on compliant surface: foam.

## 2015-09-14 NOTE — Therapy (Signed)
[place] MAIN [facility] SERVICES [place], [place], [address] Phone: [phone]   Fax:  [phone]  Physical Therapy Evaluation  Patient Details  Name: Jeff Ward MRN: [id number] Date of Birth: 13-Jun-1950 Referring Provider: Carloyn Manner  Encounter Date: 09/14/2015      PT End of Session - 09/14/15 1052    Visit Number 1   Number of Visits 7   Date for PT Re-Evaluation 2015-11-11   Authorization Type g codes   PT Start Time 0805   PT Stop Time 0900   PT Time Calculation (min) 55 min   Equipment Utilized During Treatment Gait belt   Activity Tolerance Patient tolerated treatment well   Behavior During Therapy [facility] for tasks assessed/performed      Past Medical History:  Diagnosis Date  . Arthritis    In back and all joints  . Chronic headache    now resolved  . 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 Depression   . Esophageal varices ([place])   . GERD (gastroesophageal reflux disease)   . GI bleed   . Hiatal hernia   . History of kidney stones 1970's  . Hyperlipidemia 06/2002  . Hypertension   . Psoriasis     Past Surgical History:  Procedure Laterality Date  . CARPAL TUNNEL RELEASE  May 14, 2003    / right hand  . ESOPHAGOGASTRODUODENOSCOPY N/A 07/24/2015   Procedure: ESOPHAGOGASTRODUODENOSCOPY (EGD);  Surgeon: Lucilla Lame, MD;  Location: [facility] ENDOSCOPY;  Service: Endoscopy;  Laterality: N/A;  Banding  . ESOPHAGOGASTRODUODENOSCOPY (EGD) WITH PROPOFOL N/A 08/14/2015   Procedure: ESOPHAGOGASTRODUODENOSCOPY (EGD) WITH PROPOFOL;  Surgeon: Ladene Artist, MD;  Location: [facility] ENDOSCOPY;  Service: Endoscopy;  Laterality: N/A;  . TONSILLECTOMY AND ADENOIDECTOMY     65 years old  . VASECTOMY      Vitals:   09/14/15 0806  BP: 130/65  Pulse: 67  SpO2: 100%         Subjective Assessment - 09/14/15 1049    Subjective Dizziness   Pertinent History  Pt reports that he started feeling dizzy 3-4 months  ago. He states that he was walking down grocery aisles and would fall over, most frequently to the left. 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 Pt reports that these symptoms have improved and only happen once/month at this point. However he still has "dizziness" when he gets up out of bed at night. Pt states that when he first sits up he gets dizzy for 2-3 minutes. Once he is upright his symptoms go away within a couple minutes. Pt has seen ENT and had a VNG as well as MRI studies. 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 Pt denies any tremors, shuffling, or freezing episodes.   Diagnostic tests MRI, see history    Patient Stated Goals Decrease symptoms and return to work.    Currently in Pain? No/denies      VESTIBULAR AND BALANCE EVALUATION  Onset Date:  05/28/15 (approximate)  HISTORY:  Subjective history of current problem: Pt reports that he started feeling dizzy 3-4 months ago. He states that he was walking down grocery aisles and would fall over, most frequently to the left. 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 Pt reports that these symptoms have improved and only happen  once/month at this point. However he still has "dizziness" when he gets up out of bed at night. Pt states that when he first sits up he gets dizzy for 2-3 minutes. Once he is upright his symptoms go away within a couple minutes. Pt has seen ENT and had a VNG as well as MRI studies. 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 Pt denies any tremors, shuffling, or freezing episodes. Description of dizziness: Off balance, falling over. (vertigo, unsteadiness, lightheadedness, falling, general unsteadiness, aural fullness)  Frequency: once/month  with respect to typical dizziness. However every night he gets dizzy when he gets up out of bed. Duration: 2-3 minutes Symptom nature: (motion provoked/positional/spontaneous/constant, variable, intermittent): positional   Provocative Factors: getting up out of bed. Pt denies any other provocative factors Easing Factors: resting until it goes away  Progression of symptoms: (better, worse, no change since onset): Improved. Still having daily postural dizziness History of similar episodes: No  Falls (yes/no): No Number of falls in past 6 months: No  Prior Functional Level: Independent with ADLs/IADLs, prn use of spc, only drives short distances.   Auditory complaints (tinnitus, pain, drainage): None Vision (last eye exam, diplopia, recent changes): None  Current Symptoms: Denies: vertigo, N & V, dysarthria, dysphagia, bowel and bladder changes,  lightheadedness, headache, migraines; Confirms: recent weight loss/gain, general unsteadiness/ imbalance, tilting, swaying, veering, dizzy Pt reports he has lost approximately 60# over the last 7 months. His MD is aware of this and pt has had other medical complications including a significant bleed in his esophagus that have contributed to weight loss.   EXAMINATION  Orthostatic Vitals:  Supine: BP: 151/68, HR: 67, SaO2: 100% Sitting: BP: 153/69, HR: 70, SaO2: 100% Standing: BP: 104/54, HR: 74, SaO2: 100% Asymptomatic with all position changes  POSTURE: Forward head/rounded shoulders;   NEUROLOGICAL SCREEN: (2+ unless otherwise noted.) N=normal  Ab=abnormal  Level Dermatome R L Myotome R L Reflex R L  C3 Anterior Neck N N Sidebend C2-3 N N Jaw CN V    C4 Top of Shoulder N N Shoulder Shrug C4 N N Hoffman's UMN A N  C5 Lateral Upper Arm N N Shoulder ABD C4-5 N N Biceps C5-6    C6 Lateral Arm/ Thumb N N Arm Flex/ Wrist Ext C5-6 N N Brachiorad. C5-6    C7 Middle Finger N N Arm Ext//Wrist Flex C6-7 N N Triceps C7    C8 4th & 5th Finger N N  Flex/ Ext Carpi Ulnaris C8 N N Patella    T1 Medial Arm N N Interossei T1 N N Gastrocnemius    L2 Medial thigh/groin N N Illiopsoas (L2-3) N N     L3 Lower thigh/med.knee N N Quadriceps (L3-4) N N Patellar (L3-4)    L4 Medial leg/lat thigh N N Tibialis Ant (L4-5) N N     L5 Lat. leg & dorsal foot N N EHL (L5) N N     S1 post/lat foot/thigh/leg N N Gastrocnemius (S1-2) N N Gastrocnemius (S1)    S2 Post./med. thigh & leg N N Hamstrings (L4-S3) N N Babinski     Reflexes deferred  SOMATOSENSORY:         Sensation           Intact      Diminished         Absent  Light touch Normal    Any N & T in extremities  or weakness: Denies      COORDINATION: Finger to Nose: Normal, however pt appears to get distracted during testing.  Pronator Drift: Normal   MUSCULOSKELETAL SCREEN: Cervical Spine ROM: Mild loss of cervical extension but otherwise WFL. Painless in all directions   ROM: WFL  MMT: WFL without focal weakness  Functional Mobility: Independent with all transfers  Gait: Scanning of visual environment with gait is: Good, no gross abnormalities noted  Balance: See below   POSTURAL CONTROL TESTS:   Clinical Test of Sensory Interaction for Balance    (CTSIB):  CONDITION TIME STRATEGY SWAY  Eyes open, firm surface 30 Ankle 1+  Eyes closed, firm surface 30 Ankle 2+  Eyes open, foam surface 30 Ankle, knee 2+  Eyes closed, foam surface 14 seconds Ankle, knee (pt stops because he feels too dizzy) 4+    OCULOMOTOR / VESTIBULAR TESTING:  Oculomotor Exam- Room Light  Normal Abnormal Comments  Ocular Alignment N    Ocular ROM N    Spontaneous Nystagmus N    End-Gaze Nystagmus N    Smooth Pursuit  A Saccadic especially with upper quadrants  Saccades  A Mostly abnormal in vertical direction  VOR N    VOR Cancellation     Left Head Thrust N  Difficult to test as pt has trouble relaxing neck  Right Head Thrust N    Head Shaking Nystagmus N    Static Acuity     Dynamic Acuity        BPPV TESTS: Deferred secondary to negative history, prior ENT evaluation, and orthostasis  FUNCTIONAL OUTCOME MEASURES:  Results Comments  DHI 94/100 Very high self-reported disability  ABC Scale 18.125% Very low balance confidence  DGI 22/24 Mild impairment  5TSTS 12.2 seconds WNL  BERG 54/56 Mild impairment  10 meter Walking Speed                        [facility] PT Assessment - 09/14/15 0826      Assessment   Medical Diagnosis Dizziness   Referring Provider Carloyn Manner   Onset Date/Surgical Date 05/28/15   Hand Dominance Right   Next MD Visit Appt with neurology at end of month   Prior Therapy Prior physical therapy "for my movement."     Precautions   Precautions Fall     Restrictions   Weight Bearing Restrictions No     Balance Screen   Has the patient fallen in the past 6 months No   Has the patient had a decrease in activity level because of a fear of falling?  Yes   Is the patient reluctant to leave their home because of a fear of falling?  No     Home Ecologist residence     Prior Function   Level of Independence Independent     Cognition   Overall Cognitive Status Within Functional Limits for tasks assessed     Observation/Other Assessments   Other Surveys  Other Surveys   Activities of Balance Confidence Scale (ABC Scale)  18.125%   Dizziness Handicap Inventory [facility])  94/100     Standardized Balance Assessment   Standardized Balance Assessment Five Times Sit to Stand;Dynamic Gait Index;Berg Balance Test   Five times sit to stand comments  12.2 seconds     Berg Balance Test   Sit to Stand Able to stand without using hands and stabilize independently   Standing Unsupported Able to stand safely  2 minutes   Sitting with Back Unsupported but Feet Supported on Floor or Stool Able to sit safely and securely 2 minutes   Stand to Sit Sits safely with minimal use of hands   Transfers Able to transfer  safely, minor use of hands   Standing Unsupported with Eyes Closed Able to stand 10 seconds safely   Standing Ubsupported with Feet Together Able to place feet together independently and stand 1 minute safely   From Standing, Reach Forward with Outstretched Arm Can reach forward >12 cm safely (5")   From Standing Position, Pick up Object from Floor Able to pick up shoe safely and easily   From Standing Position, Turn to Look Behind Over each Shoulder Looks behind from both sides and weight shifts well   Turn 360 Degrees Able to turn 360 degrees safely in 4 seconds or less   Standing Unsupported, Alternately Place Feet on Step/Stool Able to stand independently and safely and complete 8 steps in 20 seconds   Standing Unsupported, One Foot in Front Able to plae foot ahead of the other independently and hold 30 seconds   Standing on One Leg Able to lift leg independently and hold > 10 seconds   Total Score 54     Dynamic Gait Index   Level Surface Normal   Change in Gait Speed Normal   Gait with Horizontal Head Turns Mild Impairment   Gait with Vertical Head Turns Normal   Gait and Pivot Turn Normal   Step Over Obstacle Normal   Step Around Obstacles Normal   Steps Mild Impairment   Total Score 22        TREATMENT  NEUROMUSCULAR RE-EDUCATION Pt issued and performed narrow standing balance with horizontal head turns with therapy. Provided written handout with education regarding how to safely perform at home.                    PT Education - 09/14/15 1052    Education provided Yes   Education Details HEP, plan of care   Person(s) Educated Patient   Methods Explanation   Comprehension Verbalized understanding             PT Long Term Goals - 09/14/15 1101      PT LONG TERM GOAL #1   Title Pt will be independent with HEP in order to decrease symptoms, improve balance, and decrease risk for falls.   Time 6   Period Weeks   Status New     PT LONG TERM GOAL  #2   Title Pt will decrease DHI by at least 18 points in order to demonstrate clinically significant reduction in dizziness   Baseline 09/14/15: 94/100   Time 6   Period Weeks   Status New     PT LONG TERM GOAL #3   Title Pt will improve ABC by at least 13% in order to demonstrate clinically significant improvement in balance confidence   Baseline 09/14/15: 18.125%   Time 6   Period Weeks   Status New     PT LONG TERM GOAL #4   Title Pt will be able to ambulate with horizontal head turns and no lateral gait deviation to improve his ability to safely walk through the grocery store   Time 6   Period Weeks   Status New               Plan - 09/14/15 1053    Clinical Impression Statement Pt was referred  for physical therapy from ENT due to dizziness. Pt reports that his initial symptoms of dizziness with walking have improved and only happen once/month at this point. However he still has "dizziness" when he gets up out of bed at night. Pt states that when he first sits up he gets dizzy for 2-3 minutes. Once he is upright his symptoms go away within a couple minutes. Dizziness only happen with positional changes. Orthostatic vitals are positive today for significant drop in blood pressure from 153/69 to 104/54 from sitting to standing. Pt is asymptomatic at this time. The only test that makes patient "dizzy" today is static balance with eyes closed on foam. He does present with some central signs such as abnormal smooth pursuit and saccades. He has very poor balance confidence scoring 18.125% on ABC and high reported disability with DHI of 94/100. He has some high level balance deficits with 22/24 on DGI and 54/56 on BERG. Pt provided handout regarding orthostatic hypotension and instructed to discuss with his PCP. Pt will benefit from skilled PT services to address balance deficits in order to improve his confidence and decrease his risk for falls.    Rehab Potential Good   Clinical  Impairments Affecting Rehab Potential Positive: improving symptoms; Negative: orthostatic hypotension   PT Frequency 1x / week   PT Duration 6 weeks   PT Treatment/Interventions Aquatic Therapy;Canalith Repostioning;Cryotherapy;Electrical Stimulation;Iontophoresis 4mg /ml Dexamethasone;Moist Heat;Traction;DME Instruction;Gait training;Stair training;Therapeutic activities;Therapeutic exercise;Balance training;Neuromuscular re-education;Patient/family education;Manual techniques;Vestibular   PT Next Visit Plan Activities for balance with eyes closed or in low light, foam balance activities   PT Home Exercise Plan Feet together eyes closed static balance   Consulted and Agree with Plan of Care Patient      Patient will benefit from skilled therapeutic intervention in order to improve the following deficits and impairments:  Decreased balance, Dizziness  Visit Diagnosis: Dizziness and giddiness - Plan: PT plan of care cert/re-cert  Unsteadiness on feet - Plan: PT plan of care cert/re-cert      G-Codes - [id number]    Functional Assessment Tool Used clinical judgement, 5TSTS, BERG, DGI, DHI, ABC   Functional Limitation Mobility: Walking and moving around   Mobility: Walking and Moving Around Current Status [phone]) At least 20 percent but less than 40 percent impaired, limited or restricted   Mobility: Walking and Moving Around Goal Status [phone]) At least 1 percent but less than 20 percent impaired, limited or restricted       Problem List Patient Active Problem List   Diagnosis Date Noted  . Somnolence, daytime 09/10/2015  . Loss of weight 09/10/2015  . Alcoholic cirrhosis of liver without ascites ([place]) 08/06/2015  . Idiopathic esophageal varices with bleeding ([place]) 08/06/2015  . Hematemesis with nausea   . Secondary esophageal varices with bleeding ([place])   . UGIB (upper gastrointestinal bleed) 07/23/2015  . Alcoholic cirrhosis ([place]) [id number]  . GI bleed 07/23/2015  . Liver  cirrhosis ([place]) 06/19/2015  . GERD (gastroesophageal reflux disease) 03/22/2015  . Abdominal pain 03/22/2015  . Personal history of colonic polyps 04/27/2013  . Depression 02/17/2013  . Hyperglycemia 07/23/2011  . OSA (obstructive sleep apnea) 04/10/2011  . Testosterone deficiency 03/06/2011  . Degenerative disc disease, lumbar 05/09/2010  . OTHER NONSPECIFIC ABNORMAL SERUM ENZYME LEVELS 05/23/2009  . Essential hypertension, benign 09/01/2007  . COLONIC POLYPS 10/23/2006  . HLD (hyperlipidemia) 10/23/2006  . PUD 10/23/2006  . 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 CARPAL TUNNEL SYNDROME, BILATERAL, HX OF 10/23/2006  . BENIGN PROSTATIC  HYPERTROPHY, HX OF 10/23/2006   Phillips Grout PT, DPT   Huprich,Jason 09/14/2015, 11:12 AM  [place] MAIN [facility] SERVICES [address] [place], [place], [address] Phone: [phone]   Fax:  [phone]  Name: Jeff Ward MRN: [id number] Date of Birth: 1950/03/08

## 2015-09-14 NOTE — Telephone Encounter (Signed)
Patient called back,again. Please call patient.

## 2015-09-17 ENCOUNTER — Other Ambulatory Visit: Payer: Self-pay | Admitting: Family Medicine

## 2015-09-17 NOTE — Telephone Encounter (Signed)
Lm on pts vm. Advised pt I had not called him since speaking with him and discussing labs

## 2015-09-18 ENCOUNTER — Telehealth: Payer: Self-pay | Admitting: Family Medicine

## 2015-09-18 NOTE — Telephone Encounter (Signed)
Pt dropped off [facility] paperwork 8/21 Spoke with Estill [facility] 8/22 she stated they already received paperwork, but it hasn't been reviewed yet No additional paperwork needed at this time Disregard paperwork pt sent in

## 2015-09-21 ENCOUNTER — Ambulatory Visit: Payer: Medicare Other

## 2015-09-21 ENCOUNTER — Encounter: Payer: Self-pay | Admitting: Physical Therapy

## 2015-09-21 VITALS — BP 118/61 | HR 55

## 2015-09-21 DIAGNOSIS — R2681 Unsteadiness on feet: Secondary | ICD-10-CM | POA: Diagnosis not present

## 2015-09-21 DIAGNOSIS — R42 Dizziness and giddiness: Secondary | ICD-10-CM | POA: Diagnosis not present

## 2015-09-21 NOTE — Therapy (Signed)
[place] MAIN [facility] SERVICES [place], [place], [address] Phone: [phone]   Fax:  [phone]  Physical Therapy Treatment  Patient Details  Name: Jeff Ward MRN: [id number] Date of Birth: 01/02/1951 Referring Provider: Carloyn Manner  Encounter Date: 09/21/2015      PT End of Session - 09/21/15 0804    Visit Number 2   Number of Visits 7   Date for PT Re-Evaluation 27-Oct-2015   Authorization Type g codes 2   PT Start Time 0802   PT Stop Time 0844   PT Time Calculation (min) 42 min   Equipment Utilized During Treatment Gait belt   Activity Tolerance Patient tolerated treatment well   Behavior During Therapy [facility] for tasks assessed/performed      Past Medical History:  Diagnosis Date  . Arthritis    In back and all joints  . Chronic headache    now resolved  . 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 Depression   . Esophageal varices ([place])   . GERD (gastroesophageal reflux disease)   . GI bleed   . Hiatal hernia   . History of kidney stones 1970's  . Hyperlipidemia 06/2002  . Hypertension   . Psoriasis     Past Surgical History:  Procedure Laterality Date  . CARPAL TUNNEL RELEASE  April 29, 2003    / right hand  . ESOPHAGOGASTRODUODENOSCOPY N/A 07/24/2015   Procedure: ESOPHAGOGASTRODUODENOSCOPY (EGD);  Surgeon: Lucilla Lame, MD;  Location: [facility] ENDOSCOPY;  Service: Endoscopy;  Laterality: N/A;  Banding  . ESOPHAGOGASTRODUODENOSCOPY (EGD) WITH PROPOFOL N/A 08/14/2015   Procedure: ESOPHAGOGASTRODUODENOSCOPY (EGD) WITH PROPOFOL;  Surgeon: Ladene Artist, MD;  Location: [facility] ENDOSCOPY;  Service: Endoscopy;  Laterality: N/A;  . TONSILLECTOMY AND ADENOIDECTOMY     65 years old  . VASECTOMY      Vitals:   09/21/15 0805  BP: 118/61  Pulse: (!) 55  SpO2: 100%        Subjective Assessment - 09/21/15 0803    Subjective "I ain't getting no better." Pt reports he still has dizziness when first waking  up. Denies dizziness currently. No pain.    Pertinent History  Pt reports that he started feeling dizzy 3-4 months ago. He states that he was walking down grocery aisles and would fall over, most frequently to the left. 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 Pt reports that these symptoms have improved and only happen once/month at this point. However he still has "dizziness" when he gets up out of bed at night. Pt states that when he first sits up he gets dizzy for 2-3 minutes. Once he is upright his symptoms go away within a couple minutes. Pt has seen ENT and had a VNG as well as MRI studies. 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 Pt denies any tremors, shuffling, or freezing episodes.   Diagnostic tests MRI, see history    Patient Stated Goals Decrease symptoms and return to work.    Currently in Pain? No/denies         TREATMENT  NEUROMUSCULAR RE-EDUCATION Checked orthostatic vitals from sitting to standing only: positive for orthostasis with respect to diastolic readings; Feet together eyes closed balance x 1 minute, increased sway but no LOB; Semi-tandem alternating forward leg with horizontal and vertical head turns as well as eyes closed  x 1 minute, pt demonstrates repeated LOB to the R during horizontal head turns; Airex feet together eyes closed x 1 minute; Airex feet together horizontal and vertical head turns x 1 minute each; Airex balance with cone taps in 1 and 2 cone sequences calling out color and leg; 2"x4" tandem gait x 4 lengths; 2"x4" side stepping x 4 lengths; New HEP issued and handout provided to patient. Reviewed exercises with patient to ensure understanding.                        PT Education - 09/21/15 0804    Education provided Yes   Education Details HEP progressed   Person(s) Educated Patient    Methods Explanation   Comprehension Verbalized understanding             PT Long Term Goals - 09/14/15 1101      PT LONG TERM GOAL #1   Title Pt will be independent with HEP in order to decrease symptoms, improve balance, and decrease risk for falls.   Time 6   Period Weeks   Status New     PT LONG TERM GOAL #2   Title Pt will decrease DHI by at least 18 points in order to demonstrate clinically significant reduction in dizziness   Baseline 09/14/15: 94/100   Time 6   Period Weeks   Status New     PT LONG TERM GOAL #3   Title Pt will improve ABC by at least 13% in order to demonstrate clinically significant improvement in balance confidence   Baseline 09/14/15: 18.125%   Time 6   Period Weeks   Status New     PT LONG TERM GOAL #4   Title Pt will be able to ambulate with horizontal head turns and no lateral gait deviation to improve his ability to safely walk through the grocery store   Time [place] - 09/21/15 [id number]    Clinical Impression Statement Pt continues to present with positive orthostatic vitals signs from sit to stand. He reports that his dizziness still only occurs with positional changes first thing in the morning or overnight. Pt advised to discuss this with Dr. Melrose Nakayama at his initial evaluation with neurology. Pt issued additional HEP and handout provided. Encouraged to follow-up as scheduled for futher balance exercises.    Rehab Potential Good   Clinical Impairments Affecting Rehab Potential Positive: improving symptoms; Negative: orthostatic hypotension   PT Frequency 1x / week   PT Duration 6 weeks   PT Treatment/Interventions Aquatic Therapy;Canalith Repostioning;Cryotherapy;Electrical Stimulation;Iontophoresis 4mg /ml Dexamethasone;Moist Heat;Traction;DME Instruction;Gait training;Stair training;Therapeutic activities;Therapeutic exercise;Balance training;Neuromuscular re-education;Patient/family  education;Manual techniques;Vestibular   PT Next Visit Plan Activities for balance with eyes closed or in low light, head turning activities in tandem, foam balance activities   PT Home Exercise Plan Semi-tandem eyes closed, Semitandem with horizontal/vertical head turns, tandem gait;    Consulted and Agree with Plan of Care Patient      Patient will benefit from skilled therapeutic intervention in order to improve the following deficits and impairments:  Decreased balance, Dizziness  Visit Diagnosis: Dizziness and giddiness  Unsteadiness on feet     Problem List Patient Active Problem List   Diagnosis Date Noted  . Somnolence, daytime 09/10/2015  . Loss of weight 09/10/2015  . Alcoholic cirrhosis of liver without ascites ([place]) 08/06/2015  .  Idiopathic esophageal varices with bleeding ([place]) 08/06/2015  . Hematemesis with nausea   . Secondary esophageal varices with bleeding ([place])   . UGIB (upper gastrointestinal bleed) 07/23/2015  . Alcoholic cirrhosis ([place]) [id number]  . GI bleed 07/23/2015  . Liver cirrhosis ([place]) 06/19/2015  . GERD (gastroesophageal reflux disease) 03/22/2015  . Abdominal pain 03/22/2015  . Personal history of colonic polyps 04/27/2013  . Depression 02/17/2013  . Hyperglycemia 07/23/2011  . OSA (obstructive sleep apnea) 04/10/2011  . Testosterone deficiency 03/06/2011  . Degenerative disc disease, lumbar 05/09/2010  . OTHER NONSPECIFIC ABNORMAL SERUM ENZYME LEVELS 05/23/2009  . Essential hypertension, benign 09/01/2007  . COLONIC POLYPS 10/23/2006  . HLD (hyperlipidemia) 10/23/2006  . PUD 10/23/2006  . 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 CARPAL TUNNEL SYNDROME, BILATERAL, HX OF 10/23/2006  . BENIGN PROSTATIC HYPERTROPHY, HX OF 10/23/2006   Phillips Grout PT, DPT   Matie Dimaano 09/21/2015, 8:48 AM  [place] MAIN [facility] SERVICES [address] [place], [place], [address] Phone: [phone]   Fax:   [phone]  Name: Jeff Ward MRN: [id number] Date of Birth: Feb 07, 1950

## 2015-09-21 NOTE — Patient Instructions (Signed)
Feet Heel-Toe "Tandem"    Arms outstretched, walk a straight line bringing one foot directly in front of the other. Perform for 1 minute, relax and repeat 2 more times. Do __2__ sessions per day.   Feet Heel-Toe "Tandem", Head Motion - Eyes Open    With eyes open, right foot directly in front of the other, move head slowly: left and right and then up and down Perform for 1 minute in each direction, relax and repeat 2 more times. Do __2__ sessions per day.   Feet Heel-Toe "Tandem" - Eyes Closed    With eyes closed and right foot directly in front of the other stand and balance. Perform for 1 minute, relax and repeat 2 more times. Do __2__ sessions per day

## 2015-09-25 DIAGNOSIS — H8113 Benign paroxysmal vertigo, bilateral: Secondary | ICD-10-CM | POA: Diagnosis not present

## 2015-09-26 ENCOUNTER — Ambulatory Visit (INDEPENDENT_AMBULATORY_CARE_PROVIDER_SITE_OTHER): Payer: Medicare Other | Admitting: Gastroenterology

## 2015-09-26 ENCOUNTER — Encounter: Payer: Self-pay | Admitting: Gastroenterology

## 2015-09-26 VITALS — BP 122/64 | HR 60 | Ht 65.0 in | Wt 168.0 lb

## 2015-09-26 DIAGNOSIS — K7031 Alcoholic cirrhosis of liver with ascites: Secondary | ICD-10-CM | POA: Diagnosis not present

## 2015-09-26 DIAGNOSIS — I8510 Secondary esophageal varices without bleeding: Secondary | ICD-10-CM | POA: Diagnosis not present

## 2015-09-26 DIAGNOSIS — K7030 Alcoholic cirrhosis of liver without ascites: Secondary | ICD-10-CM

## 2015-09-26 MED ORDER — FUROSEMIDE 20 MG PO TABS: 20.0000 mg | ORAL_TABLET | Freq: Every day | ORAL | 0 refills | Status: DC

## 2015-09-26 MED ORDER — FERROUS SULFATE 325 (65 FE) MG PO TABS: 325.0000 mg | ORAL_TABLET | Freq: Three times a day (TID) | ORAL | 5 refills | Status: DC

## 2015-09-26 NOTE — Progress Notes (Signed)
    History of Present Illness: This is a 65 year old male alcoholic cirrhosis with esophageal varices, HE, ascites. Had variceal bleed in 06/2015 treated with banding. Follow up EGD with banding and eradication in 07/2015. Weight stable. Complains of low energy. Lactulose and Fe recently increased by Dr. Deborra Medina. No GI bleeding, abdominal swelling or abdominal pain.   Current Medications, Allergies, Past Medical History, Past Surgical History, Family History and Social History were reviewed in [employer] record.  Physical Exam: General: Well developed, well nourished, no acute distress Head: Normocephalic and atraumatic Eyes:  sclerae anicteric, EOMI Ears: Normal auditory acuity Mouth: No deformity or lesions Lungs: Clear throughout to auscultation Heart: Regular rate and rhythm; no murmurs, rubs or bruits Abdomen: Soft, non tender and non distended. No masses, hepatosplenomegaly or hernias noted. Diastases recti noted. Normal Bowel sounds Musculoskeletal: Symmetrical with no gross deformities  Pulses:  Normal pulses noted Extremities: No clubbing, cyanosis, edema or deformities noted Neurological: Alert oriented x 4, grossly nonfocal Psychological:  Alert and cooperative. Normal mood and affect  Assessment and Recommendations:  1. 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 CNadolol 20 mg daily. Lactulose 10 g tid. Lasix 20 mg daily. Aldactone 100 mg daily. Follow daily weight-call for significant increase or decrease. EGD in 1 year. REV in 6 months with me. Interim follow with Dr. Deborra Medina.   2. Anemia. Increase Fe to tid with meals as recommended by Dr. Deborra Medina.

## 2015-09-26 NOTE — Patient Instructions (Signed)
Increase your iron to one tablet by mouth three times a day with meals. A new prescription has been sent to your pharmacy.   Continue your lasix 20 mg daily and aldactone 100 mg daily.   Take daily weights  the same time, same scale, same amount of clothing every day.  Follow up with Dr. Fuller Plan in 6 months.   Thank you for choosing me and [facility].  Pricilla Riffle. Dagoberto Ligas., MD., Marval Regal

## 2015-09-28 ENCOUNTER — Ambulatory Visit: Payer: Medicare Other | Admitting: Physical Therapy

## 2015-10-02 ENCOUNTER — Telehealth: Payer: Self-pay

## 2015-10-02 NOTE — Telephone Encounter (Signed)
Pt request order by Dr Deborra Medina to recheck ammonia level; and the lab test that were done 09/10/15; then pt will schedule appt to see Dr Deborra Medina. Pt understands Dr Deborra Medina is out of office and pt wants to wait until Dr Hulen Shouts return to get labs ordered.

## 2015-10-03 ENCOUNTER — Other Ambulatory Visit: Payer: Self-pay | Admitting: Family Medicine

## 2015-10-03 DIAGNOSIS — K7031 Alcoholic cirrhosis of liver with ascites: Secondary | ICD-10-CM

## 2015-10-03 NOTE — Telephone Encounter (Signed)
LMOM for pt to schedule an appt for lab work.

## 2015-10-03 NOTE — Telephone Encounter (Signed)
Ok to recheck ammonia level

## 2015-10-05 ENCOUNTER — Ambulatory Visit: Payer: Medicare Other | Attending: Otolaryngology | Admitting: Physical Therapy

## 2015-10-05 ENCOUNTER — Encounter: Payer: Self-pay | Admitting: Physical Therapy

## 2015-10-05 ENCOUNTER — Other Ambulatory Visit (INDEPENDENT_AMBULATORY_CARE_PROVIDER_SITE_OTHER): Payer: Medicare Other

## 2015-10-05 DIAGNOSIS — R42 Dizziness and giddiness: Secondary | ICD-10-CM | POA: Diagnosis not present

## 2015-10-05 DIAGNOSIS — K7031 Alcoholic cirrhosis of liver with ascites: Secondary | ICD-10-CM | POA: Diagnosis not present

## 2015-10-05 DIAGNOSIS — R2681 Unsteadiness on feet: Secondary | ICD-10-CM | POA: Diagnosis not present

## 2015-10-05 NOTE — Therapy (Signed)
[place] MAIN [facility] SERVICES [place], [place], [address] Phone: [phone]   Fax:  [phone]  Physical Therapy Treatment  Patient Details  Name: Jeff Ward MRN: [id number] Date of Birth: 10/15/1950 Referring Provider: Carloyn Manner  Encounter Date: 10/05/2015      PT End of Session - 10/05/15 1113    Visit Number 3   Number of Visits 7   Date for PT Re-Evaluation 11-08-2015   Authorization Type g codes 3   PT Start Time [id number]   PT Stop Time 1112   PT Time Calculation (min) 43 min   Equipment Utilized During Treatment Gait belt   Activity Tolerance Patient tolerated treatment well   Behavior During Therapy [facility] for tasks assessed/performed      Past Medical History:  Diagnosis Date  . Arthritis    In back and all joints  . Chronic headache    now resolved  . 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 Depression   . Esophageal varices ([place])   . GERD (gastroesophageal reflux disease)   . GI bleed   . Hiatal hernia   . History of kidney stones 1970's  . Hyperlipidemia 06/2002  . Hypertension   . Psoriasis     Past Surgical History:  Procedure Laterality Date  . CARPAL TUNNEL RELEASE  2005    / right hand  . ESOPHAGOGASTRODUODENOSCOPY N/A 07/24/2015   Procedure: ESOPHAGOGASTRODUODENOSCOPY (EGD);  Surgeon: Lucilla Lame, MD;  [facility] ENDOSCOPY;  Service: Endoscopy;  Laterality: N/A;  Banding  . ESOPHAGOGASTRODUODENOSCOPY (EGD) WITH PROPOFOL N/A 08/14/2015   Procedure: ESOPHAGOGASTRODUODENOSCOPY (EGD) WITH PROPOFOL;  Surgeon: Ladene Artist, MD;  Location: [facility] ENDOSCOPY;  Service: Endoscopy;  Laterality: N/A;  . TONSILLECTOMY AND ADENOIDECTOMY     65 years old  . VASECTOMY      There were no vitals filed for this visit.      Subjective Assessment - 10/05/15 1032    Subjective Pt states he is doing pretty good with doing his exercises but states as soon as he closes his eyes he startes  to get wobbly. Pt states he has a physician follow up appointment today. Pt states that he has not been cleared to go back to work yet because his iron levels have been too low.    Pertinent History  Pt reports that he started feeling dizzy 3-4 months ago. He states that he was walking down grocery aisles and would fall over, most frequently to the left. 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 Pt reports that these symptoms have improved and only happen once/month at this point. However he still has "dizziness" when he gets up out of bed at night. Pt states that when he first sits up he gets dizzy for 2-3 minutes. Once he is upright his symptoms go away within a couple minutes. Pt has seen ENT and had a VNG as well as MRI studies. 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 Pt denies any tremors, shuffling, or freezing episodes.   Diagnostic tests MRI, see history    Patient Stated Goals Decrease symptoms and return to work.    Currently in Pain? No/denies      Neuromuscular Re-education:  Airex pad: On firm surface and then on Airex pad, performed feet together progressions and semi-tandem progressions with alternate lead  leg with and without horiz head turns and then with body turns side to side with CGA. On firm surface and then on Airex pad, worked on static stance with narrow base of support EC. Pt demonstrates increased difficulty with eyes closed activities.   On Airex balance beam: Pt performed sidestepping L/R without head turns and then with horiz head turns multiple reps of 5' each. Performed on firm surface and then on Airex balance beam tandem walking 5' times multiple reps. Pt did well with sidestepping and reported no increase in dizziness with sidestepping with head turns. Pt was challenged by tandem walking activity and had several trunk sways  using ankle and hip strategies to maintain balance.   Rockerboard: On small wooden rocker board, worked on [employer] with eyes open and then with eyes closed. 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  Discussed and demonstrated strategies to help minimize dizziness with position changes such as standing up from sitting position.        PT Long Term Goals - 09/14/15 1101      PT LONG TERM GOAL #1   Title Pt will be independent with HEP in order to decrease symptoms, improve balance, and decrease risk for falls.   Time 6   Period Weeks   Status New     PT LONG TERM GOAL #2   Title Pt will decrease DHI by at least 18 points in order to demonstrate clinically significant reduction in dizziness   Baseline 09/14/15: 94/100   Time 6   Period Weeks   Status New     PT LONG TERM GOAL #3   Title Pt will improve ABC by at least 13% in order to demonstrate clinically significant improvement in balance confidence   Baseline 09/14/15: 18.125%   Time 6   Period Weeks   Status New     PT LONG TERM GOAL #4   Title Pt will be able to ambulate with horizontal head turns and no lateral gait deviation to improve his ability to safely walk through the grocery store   Time 6   Period [place] - 10/05/15 1113    Clinical Impression Statement Patient is challenged by actvities on Airex pad and rockerboard this date. Pt demonstrates difficulty with EC and tandem gait activities. Pt would benefit from continued PT services to address symptoms of dizziness.    Rehab Potential Good   Clinical Impairments Affecting Rehab Potential Positive: improving symptoms; Negative: orthostatic hypotension   PT Frequency 1x / week   PT Duration 6 weeks   PT Treatment/Interventions Aquatic Therapy;Canalith Repostioning;Cryotherapy;Electrical Stimulation;Iontophoresis 4mg /ml Dexamethasone;Moist Heat;Traction;DME Instruction;Gait training;Stair training;Therapeutic  activities;Therapeutic exercise;Balance training;Neuromuscular re-education;Patient/family education;Manual techniques;Vestibular   PT Next Visit Plan  head turning activities in tandem, EC activities;    PT Home Exercise Plan Semi-tandem eyes closed, Semitandem with horizontal/vertical head and body turns, tandem gait; Feet together EC   Consulted and Agree with Plan of Care Patient      Patient will benefit from skilled therapeutic intervention in order to improve the following deficits and impairments:  Decreased balance, Dizziness  Visit Diagnosis: Dizziness and giddiness  Unsteadiness on feet     Problem List Patient Active Problem List   Diagnosis Date Noted  . Somnolence, daytime 09/10/2015  . Loss of weight 09/10/2015  . Alcoholic cirrhosis of liver without ascites ([place]) 08/06/2015  . Idiopathic esophageal varices  with bleeding ([place]) 08/06/2015  . Hematemesis with nausea   . Secondary esophageal varices with bleeding ([place])   . UGIB (upper gastrointestinal bleed) 07/23/2015  . Alcoholic cirrhosis ([place]) [id number]  . GI bleed 07/23/2015  . Liver cirrhosis ([place]) 06/19/2015  . GERD (gastroesophageal reflux disease) 03/22/2015  . Abdominal pain 03/22/2015  . Personal history of colonic polyps 04/27/2013  . Depression 02/17/2013  . Hyperglycemia 07/23/2011  . OSA (obstructive sleep apnea) 04/10/2011  . Testosterone deficiency 03/06/2011  . Degenerative disc disease, lumbar 05/09/2010  . OTHER NONSPECIFIC ABNORMAL SERUM ENZYME LEVELS 05/23/2009  . Essential hypertension, benign 09/01/2007  . COLONIC POLYPS 10/23/2006  . HLD (hyperlipidemia) 10/23/2006  . PUD 10/23/2006  . 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 CARPAL TUNNEL SYNDROME, BILATERAL, HX OF 10/23/2006  . BENIGN PROSTATIC HYPERTROPHY, HX OF 10/23/2006   Lady Deutscher PT, DPT Lady Deutscher 10/05/2015, 3:31 PM  [place] MAIN [facility] SERVICES [address]  [place], [place], [address] Phone: [phone]   Fax:  [phone]  Name: Jeff Ward MRN: [id number] Date of Birth: [birth year]

## 2015-10-06 LAB — AMMONIA: Ammonia: 145 umol/L — ABNORMAL HIGH (ref <=47)

## 2015-10-09 ENCOUNTER — Ambulatory Visit (INDEPENDENT_AMBULATORY_CARE_PROVIDER_SITE_OTHER): Payer: Medicare Other | Admitting: Family Medicine

## 2015-10-09 ENCOUNTER — Encounter: Payer: Self-pay | Admitting: Family Medicine

## 2015-10-09 VITALS — BP 144/80 | HR 73 | Temp 98.2°F | Wt 168.0 lb

## 2015-10-09 DIAGNOSIS — R400 Somnolence: Secondary | ICD-10-CM

## 2015-10-09 DIAGNOSIS — Z23 Encounter for immunization: Secondary | ICD-10-CM

## 2015-10-09 DIAGNOSIS — G4710 Hypersomnia, unspecified: Secondary | ICD-10-CM | POA: Diagnosis not present

## 2015-10-09 DIAGNOSIS — K7469 Other cirrhosis of liver: Secondary | ICD-10-CM | POA: Diagnosis not present

## 2015-10-09 MED ORDER — LACTULOSE 10 GM/15ML PO SOLN: 20.0000 g | Freq: Three times a day (TID) | ORAL | 3 refills | Status: DC

## 2015-10-09 NOTE — Patient Instructions (Signed)
Great to see you. Please increase your lactulose to three times daily AND your iron to three times daily.  Repeat labs in 3 weeks please.

## 2015-10-09 NOTE — Assessment & Plan Note (Signed)
Ammonia level has actually increased but he did not increase his lactulose as recommended.  >25 minutes spent in face to face time with patient, >50% spent in counselling or coordination of care discussing his medication.  Advised increasing his lactulose to three times daily along with his iron. Printed AVS and advised he show his children this again. Follow up labs in 3 weeks.

## 2015-10-09 NOTE — Progress Notes (Signed)
Pre visit review using our clinic review tool, if applicable. No additional management support is needed unless otherwise documented below in the visit note. 

## 2015-10-09 NOTE — Progress Notes (Signed)
Subjective:   Patient ID: Jeff Ward, male    DOB: [birth year], 65 y.o.   MRN: [id number]  Jeff Ward is a pleasant 65 y.o. year old male who presents to clinic today with his son for Follow-up; Labs Only (discuss ); and Fatigue  on 10/09/2015  HPI:  Recent diagnosis ofesophageal varices, hepatic encephalopathy, and ascites. He was recently hospitalized at [facility] for upper GI bleed on June 26. He underwent EGD on June 27 by Dr. Allen Norris where he was found to have grade 3 bleeding esophageal varices, incompletely eradicated/banded with clotted blood in the entire stomach. It appears that he was given octreotide, PPI drip, and Rocephin antibiotics while he was there. Received at least 2 units of packed red blood cells. He is now on Cipro 500 mg twice a day that appears that they placed him on for SBP prophylaxis. He is on iron sulfate twice a day for this is making his stools dark in color. No more hematemesis.   S/p EGD with propofol as surgical intervention for varices on 08/14/15. EGD reviewed- varcies banded.  Since discharge, progressive daytime somnolence and weight loss.  Saw Dr. Fuller Plan on 09/26/15- note reviewed. Advised the following:  1. 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 CNadolol 20 mg daily. Lactulose 10 g tid. Lasix 20 mg daily. Aldactone 100 mg daily. Follow daily weight-call for significant increase or decrease. EGD in 1 year. REV in 6 months with me. Interim follow with Dr. Deborra Medina.   2. Anemia. Increase Fe to tid with meals as recommended by Dr. Deborra Medina.   Ammonia remains elevated.  Last OV on 8/14, I recommended that he increase his lactulose to three times daily and his iron to three times daily.  He has not done either one of these.  He is still taking both twice daily.   Still complaining of daytime somnolence.  Appetite has improved. Current Outpatient Prescriptions on File Prior to Visit  Medication Sig  Dispense Refill  . EPINEPHrine (EPIPEN 2-PAK) 0.3 mg/0.3 mL IJ SOAJ injection Inject 0.3 mg into the muscle once as needed (for severe allergic reaction).    . fenofibrate (TRICOR) 145 MG tablet Take 145 mg by mouth every morning.     . ferrous sulfate 325 (65 FE) MG tablet Take 1 tablet (325 mg total) by mouth 3 (three) times daily with meals. 90 tablet 5  . folic acid (FOLVITE) 1 MG tablet Take 1 tablet (1 mg total) by mouth daily. (Patient taking differently: Take 1 mg by mouth every morning. ) 30 tablet 0  . furosemide (LASIX) 20 MG tablet Take 1 tablet (20 mg total) by mouth daily. 30 tablet 0  . glucosamine-chondroitin 500-400 MG tablet Take 1 tablet by mouth 2 (two) times daily.      . meclizine (ANTIVERT) 25 MG tablet Take 1 tablet (25 mg total) by mouth daily as needed for dizziness or nausea. 90 tablet 0  . mometasone (ELOCON) 0.1 % lotion Place 4 drops into both ears 2 (two) times daily. For dry skin  0  . Multiple Vitamin (MULTIVITAMIN WITH MINERALS) TABS tablet Take 1 tablet by mouth daily.    . nadolol (CORGARD) 20 MG tablet Take 1 tablet (20 mg total) by mouth daily. 30 tablet 2  . pantoprazole (PROTONIX) 40 MG tablet Take 1 tablet (40 mg total) by mouth 2 (two) times daily. 60 tablet 2  . potassium chloride SA (K-DUR,KLOR-CON) 20 MEQ tablet Take  1 tablet (20 mEq total) by mouth 2 (two) times daily. 60 tablet 0  . spironolactone (ALDACTONE) 50 MG tablet Take 2 tablets (100 mg total) by mouth daily. 30 tablet 6  . tetrahydrozoline 0.05 % ophthalmic solution Place 1 drop into both eyes daily.    Marland Kitchen thiamine 100 MG tablet Take 1 tablet (100 mg total) by mouth 2 (two) times daily. 60 tablet 0  . TWINRIX 720-20 injection Inject 1 mL into the muscle See admin instructions. 3 doses : 2nd dose given 1 month from 1st dose and 3rd dose given 6 months from 1st dose     No current facility-administered medications on file prior to visit.     Allergies  Allergen Reactions  . Bee Venom  Anaphylaxis  . Sulfa Antibiotics Other (See Comments)    Reaction:  Fainting     Past Medical History:  Diagnosis Date  . Arthritis    In back and all joints  . Chronic headache    now resolved  . 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 Depression   . Esophageal varices ([place])   . GERD (gastroesophageal reflux disease)   . GI bleed   . Hiatal hernia   . History of kidney stones 1970's  . Hyperlipidemia 06/2002  . Hypertension   . Psoriasis     Past Surgical History:  Procedure Laterality Date  . CARPAL TUNNEL RELEASE  2005    / right hand  . ESOPHAGOGASTRODUODENOSCOPY N/A 07/24/2015   Procedure: ESOPHAGOGASTRODUODENOSCOPY (EGD);  Surgeon: Lucilla Lame, MD;  Location: [facility] ENDOSCOPY;  Service: Endoscopy;  Laterality: N/A;  Banding  . ESOPHAGOGASTRODUODENOSCOPY (EGD) WITH PROPOFOL N/A 08/14/2015   Procedure: ESOPHAGOGASTRODUODENOSCOPY (EGD) WITH PROPOFOL;  Surgeon: Ladene Artist, MD;  Location: [facility] ENDOSCOPY;  Service: Endoscopy;  Laterality: N/A;  . TONSILLECTOMY AND ADENOIDECTOMY     65 years old  . VASECTOMY      Family History  Problem Relation Age of Onset  . Melanoma Father   . Breast cancer Paternal Aunt   . Alcohol abuse Maternal Grandfather     Social History   Social History  . Marital status: Married    Spouse name: N/A  . Number of children: 2  . Years of education: N/A   Occupational History  . [employer]-Maintence     currently unemployed  .  [place]   Social History Main Topics  . Smoking status: Former Smoker    Years: 35.00    Types: Cigars    Quit date: 04/02/2010  . Smokeless tobacco: Never Used     Comment: smoked cigars 1 to 2 daily  . Alcohol use No  . Drug use: No  . Sexual activity: Yes    Partners: Female     Comment: declined condoms   Other Topics Concern  . Not on file   Social History Narrative  . No narrative on file   The PMH, PSH, Social History, Family History, Medications, and  allergies have been reviewed in [facility], and have been updated if relevant.  Review of Systems  Constitutional: Positive for fatigue. Negative for appetite change and unexpected weight change.  Respiratory: Negative.   Cardiovascular: Negative.   Musculoskeletal: Negative.   Neurological: Positive for weakness. Negative for dizziness, tremors, seizures, syncope, facial asymmetry, speech difficulty, light-headedness, numbness and headaches.  Hematological: Negative.   Psychiatric/Behavioral: Negative.   All other systems reviewed and are negative.      Objective:  BP (!) 144/80   Pulse 73   Temp 98.2 F (36.8 C) (Oral)   Wt 168 lb (76.2 kg)   SpO2 99%   BMI 27.96 kg/m   Wt Readings from Last 3 Encounters:  10/09/15 168 lb (76.2 kg)  09/26/15 168 lb (76.2 kg)  09/10/15 164 lb 4 oz (74.5 kg)    Physical Exam  Constitutional: He is oriented to person, place, and time. He appears well-developed and well-nourished. No distress.  HENT:  Head: Normocephalic.  Eyes: Conjunctivae are normal.  Pulmonary/Chest: Effort normal.  Abdominal: Soft. He exhibits no distension. There is no tenderness. There is no rebound.  Musculoskeletal: Normal range of motion.  Neurological: He is alert and oriented to person, place, and time. No cranial nerve deficit.  Skin: Skin is warm and dry. He is not diaphoretic.  Psychiatric: He has a normal mood and affect. His behavior is normal. Judgment and thought content normal.  Nursing note and vitals reviewed.         Assessment & Plan:   Need for influenza vaccination - Plan: Flu Vaccine QUAD 36+ mos PF IM (Fluarix & Fluzone Quad PF)  Other cirrhosis of liver (HCC)  Somnolence, daytime No Follow-up on file.

## 2015-10-12 ENCOUNTER — Encounter: Payer: Self-pay | Admitting: Physical Therapy

## 2015-10-12 ENCOUNTER — Ambulatory Visit: Payer: Medicare Other | Admitting: Physical Therapy

## 2015-10-12 DIAGNOSIS — R42 Dizziness and giddiness: Secondary | ICD-10-CM | POA: Diagnosis not present

## 2015-10-12 DIAGNOSIS — R2681 Unsteadiness on feet: Secondary | ICD-10-CM

## 2015-10-12 NOTE — Therapy (Signed)
[place] MAIN [facility] SERVICES [place], [place], [address] Phone: [phone]   Fax:  [phone]  Physical Therapy Treatment  Patient Details  Name: Jeff Ward MRN: [id number] Date of Birth: 09-01-1950 Referring Provider: Carloyn Manner  Encounter Date: 10/12/2015      PT End of Session - 10/12/15 1024    Visit Number --   Number of Visits 7   Date for PT Re-Evaluation 2015-11-20   Authorization Type g codes 4   PT Start Time May 22, 1016   PT Stop Time 1100   PT Time Calculation (min) 42 min   Equipment Utilized During Treatment Gait belt   Activity Tolerance Patient tolerated treatment well   Behavior During Therapy [facility] for tasks assessed/performed      Past Medical History:  Diagnosis Date  . Arthritis    In back and all joints  . Chronic headache    now resolved  . 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 Depression   . Esophageal varices ([place])   . GERD (gastroesophageal reflux disease)   . GI bleed   . Hiatal hernia   . History of kidney stones 1970's  . Hyperlipidemia 06/2002  . Hypertension   . Psoriasis     Past Surgical History:  Procedure Laterality Date  . CARPAL TUNNEL RELEASE  2003-05-23    / right hand  . ESOPHAGOGASTRODUODENOSCOPY N/A 07/24/2015   Procedure: ESOPHAGOGASTRODUODENOSCOPY (EGD);  Surgeon: Lucilla Lame, MD;  Location: [facility] ENDOSCOPY;  Service: Endoscopy;  Laterality: N/A;  Banding  . ESOPHAGOGASTRODUODENOSCOPY (EGD) WITH PROPOFOL N/A 08/14/2015   Procedure: ESOPHAGOGASTRODUODENOSCOPY (EGD) WITH PROPOFOL;  Surgeon: Ladene Artist, MD;  Location: [facility] ENDOSCOPY;  Service: Endoscopy;  Laterality: N/A;  . TONSILLECTOMY AND ADENOIDECTOMY     65 years old  . VASECTOMY      There were no vitals filed for this visit.      Subjective Assessment - 10/12/15 1022    Subjective Patient states he had an MD appointment last week after therapy. Pt states they increased his lactulose and  iron pills to 3 times a day and patient states he has been doing this. Pt states he goes back for a follow-up MD appointment in 3 weeks and they are going to do a follow-up blood test. Pt states he has not been having any dizziness and states that he has not had any more episodes of loss of balance to the left in busy environments.    Pertinent History  Pt reports that he started feeling dizzy 3-4 months ago. He states that he was walking down grocery aisles and would fall over, most frequently to the left. 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 Pt reports that these symptoms have improved and only happen once/month at this point. However he still has "dizziness" when he gets up out of bed at night. Pt states that when he first sits up he gets dizzy for 2-3 minutes. Once he is upright his symptoms go away within a couple minutes. Pt has seen ENT and had a VNG as well as MRI studies. 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 Pt denies any tremors, shuffling, or freezing episodes.   Diagnostic tests MRI, see history    Patient Stated Goals Decrease symptoms and return to work.    Currently in Pain? Yes  Pain Score 6    Pain Location Knee   Pain Orientation Right      Neuromuscular Re-education: VOR exercise: In standing, pt performed VOR X 1 horiz 1 rep of 30 seconds and then 2 reps of 1 minute each. Pt required cuing for technique. Patient had difficulty maintaining gaze on target at times and noted several corrective saccades. Pt unaware that he was having a difficult time tracking. Pt denied dizziness.   Performed smooth pursuit testing, pt with several large corrective saccades with left visual field tracking as well as with inferior gaze.    foam roll: On 1/2 foam roll with flat side down worked on static holds for 3-5 minutes. Worked on 1/2 foam  roll flat side down static holds and then side stepping L/R. Then with flat side down, performed static stands with and without horiz and vert head turns and attempted sidestepping with horiz head turns but this was too difficult at this time.    2" X 4" board:   On 2" X 4" board worked on static sideways stance with and without head turns and then worked on sidestepping L/R with and without horiz head turns and forward tandem walking 8' times multiple reps of each type. Pt required CGA with activities on 2 X 4" board. Patient denied dizziness but demonstrated difficulty with maintaining balance with these activities as well as with 1/2 foam roll activities.    Diona Foley toss to self: Performed static ball circles while turning head and eyes to follow ball. Then, worked on 36' trials of ambulation while doing ball circles to self. Patient did not have any evidence of imbalance and denies dizziness with this activity.    Walking with head turns: Performed multiple 42' trials each of ambulation with and without diagonal head turns with SBA.       PT Education - 10/12/15 1156    Education provided Yes   Education Details Discussed POC and plan on retesting functional outcome measures next session; discussed patients symptoms   Person(s) Educated Patient   Methods Explanation;Demonstration   Comprehension Verbalized understanding;Returned demonstration;Verbal cues required             PT Long Term Goals - 09/14/15 1101      PT LONG TERM GOAL #1   Title Pt will be independent with HEP in order to decrease symptoms, improve balance, and decrease risk for falls.   Time 6   Period Weeks   Status New     PT LONG TERM GOAL #2   Title Pt will decrease DHI by at least 18 points in order to demonstrate clinically significant reduction in dizziness   Baseline 09/14/15: 94/100   Time 6   Period Weeks   Status New     PT LONG TERM GOAL #3   Title Pt will improve ABC by at least 13% in order  to demonstrate clinically significant improvement in balance confidence   Baseline 09/14/15: 18.125%   Time 6   Period Weeks   Status New     PT LONG TERM GOAL #4   Title Pt will be able to ambulate with horizontal head turns and no lateral gait deviation to improve his ability to safely walk through the grocery store   Time [place] - 10/12/15 1025    Clinical Impression Statement Patient is challenged by high level  balance activities but denies dizziness throughout session. Pt reports that he has not been having any dizziness symptoms in the past few weeks and he has not had any further episodes of LOB to the left in busy environments. Pt is able to tolerate increase in balance progression activities each week. Plan on retesting functional outcome measures next visit and updating goals.    Rehab Potential Good   Clinical Impairments Affecting Rehab Potential Positive: improving symptoms; Negative: orthostatic hypotension   PT Frequency 1x / week   PT Duration 6 weeks   PT Treatment/Interventions Aquatic Therapy;Canalith Repostioning;Cryotherapy;Electrical Stimulation;Iontophoresis 4mg /ml Dexamethasone;Moist Heat;Traction;DME Instruction;Gait training;Stair training;Therapeutic activities;Therapeutic exercise;Balance training;Neuromuscular re-education;Patient/family education;Manual techniques;Vestibular   PT Next Visit Plan retest functional outcome measures-discharge if meets goals and patient continues to report overall improvement in his symptoms.    PT Home Exercise Plan Semi-tandem eyes closed, Semitandem with horizontal/vertical head and body turns, tandem gait; Feet together EC   Consulted and Agree with Plan of Care Patient      Patient will benefit from skilled therapeutic intervention in order to improve the following deficits and impairments:  Decreased balance, Dizziness  Visit Diagnosis: Dizziness and  giddiness  Unsteadiness on feet     Problem List Patient Active Problem List   Diagnosis Date Noted  . Somnolence, daytime 09/10/2015  . Loss of weight 09/10/2015  . Alcoholic cirrhosis of liver without ascites ([place]) 08/06/2015  . Idiopathic esophageal varices with bleeding ([place]) 08/06/2015  . Hematemesis with nausea   . Secondary esophageal varices with bleeding ([place])   . UGIB (upper gastrointestinal bleed) 07/23/2015  . Alcoholic cirrhosis ([place]) [id number]  . GI bleed 07/23/2015  . Liver cirrhosis ([place]) 06/19/2015  . GERD (gastroesophageal reflux disease) 03/22/2015  . Abdominal pain 03/22/2015  . Personal history of colonic polyps 04/27/2013  . Depression 02/17/2013  . Hyperglycemia 07/23/2011  . OSA (obstructive sleep apnea) 04/10/2011  . Testosterone deficiency 03/06/2011  . Degenerative disc disease, lumbar 05/09/2010  . OTHER NONSPECIFIC ABNORMAL SERUM ENZYME LEVELS 05/23/2009  . Essential hypertension, benign 09/01/2007  . COLONIC POLYPS 10/23/2006  . HLD (hyperlipidemia) 10/23/2006  . PUD 10/23/2006  . 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 CARPAL TUNNEL SYNDROME, BILATERAL, HX OF 10/23/2006  . BENIGN PROSTATIC HYPERTROPHY, HX OF 10/23/2006    Husna Krone 10/12/2015, 12:22 PM  [place] MAIN [facility] SERVICES [address] [place], [place], [address] Phone: [phone]   Fax:  [phone]  Name: ATHENS MISHRA MRN: [id number] Date of Birth: [birth year]

## 2015-10-16 ENCOUNTER — Telehealth: Payer: Self-pay | Admitting: Family Medicine

## 2015-10-16 NOTE — Telephone Encounter (Signed)
Pt called regarding a refill for his potassium med.  He said that his pharmacy told him that we need to send over a new rx.  [facility] [place] [facility] and would also like [facility]  He can be reached at [phone]

## 2015-10-16 NOTE — Telephone Encounter (Signed)
Ok to refill both rx as requested.

## 2015-10-17 MED ORDER — POTASSIUM CHLORIDE CRYS ER 20 MEQ PO TBCR: 20.0000 meq | EXTENDED_RELEASE_TABLET | Freq: Two times a day (BID) | ORAL | 1 refills | Status: DC

## 2015-10-17 NOTE — Addendum Note (Signed)
Addended by: Modena Nunnery on: 10/17/2015 09:03 AM   Modules accepted: Orders

## 2015-10-18 ENCOUNTER — Other Ambulatory Visit: Payer: Self-pay | Admitting: *Deleted

## 2015-10-18 ENCOUNTER — Telehealth: Payer: Self-pay | Admitting: *Deleted

## 2015-10-18 ENCOUNTER — Encounter: Payer: Medicare Other | Admitting: Physical Therapy

## 2015-10-18 MED ORDER — LACTULOSE 10 GM/15ML PO SOLN: 20.0000 g | Freq: Three times a day (TID) | ORAL | 11 refills | Status: DC

## 2015-10-18 NOTE — Telephone Encounter (Signed)
We received a fax , prescription request for Lactulose.  I messaged Dr.Stark to ask if he would refill it. He just saw the patient on 09-26-2015.  Alcoholic cirrhosis and ascites patient.  Dr. Fuller Plan said to refill it for a year.  [facility] refill to [facility]. Date: 10-18-2015.

## 2015-10-19 ENCOUNTER — Ambulatory Visit: Payer: Medicare Other | Admitting: Physical Therapy

## 2015-10-19 ENCOUNTER — Encounter: Payer: Self-pay | Admitting: Physical Therapy

## 2015-10-19 ENCOUNTER — Encounter: Payer: Medicare Other | Admitting: Physical Therapy

## 2015-10-19 DIAGNOSIS — R42 Dizziness and giddiness: Secondary | ICD-10-CM | POA: Diagnosis not present

## 2015-10-19 DIAGNOSIS — R2681 Unsteadiness on feet: Secondary | ICD-10-CM

## 2015-10-19 NOTE — Therapy (Signed)
[place] MAIN [facility] SERVICES [place], [place], [address] Phone: [phone]   Fax:  [phone]  Physical Therapy Treatment  Patient Details  Name: Jeff Ward MRN: [id number] Date of Birth: 02-18-1950 Referring Provider: Carloyn Manner  Encounter Date: 10/19/2015      PT End of Session - 10/19/15 1306    Visit Number 1   Number of Visits 5   Date for PT Re-Evaluation Dec 11, 2015   Authorization Type g codes 5   PT Start Time 1113   PT Stop Time 1200   PT Time Calculation (min) 47 min   Equipment Utilized During Treatment Gait belt   Activity Tolerance Patient tolerated treatment well   Behavior During Therapy [facility] for tasks assessed/performed      Past Medical History:  Diagnosis Date  . Arthritis    In back and all joints  . Chronic headache    now resolved  . 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 Depression   . Esophageal varices ([place])   . GERD (gastroesophageal reflux disease)   . GI bleed   . Hiatal hernia   . History of kidney stones 1970's  . Hyperlipidemia 06/2002  . Hypertension   . Psoriasis     Past Surgical History:  Procedure Laterality Date  . CARPAL TUNNEL RELEASE  2005    / right hand  . ESOPHAGOGASTRODUODENOSCOPY N/A 07/24/2015   Procedure: ESOPHAGOGASTRODUODENOSCOPY (EGD);  Surgeon: Lucilla Lame, MD;  Location: [facility] ENDOSCOPY;  Service: Endoscopy;  Laterality: N/A;  Banding  . ESOPHAGOGASTRODUODENOSCOPY (EGD) WITH PROPOFOL N/A 08/14/2015   Procedure: ESOPHAGOGASTRODUODENOSCOPY (EGD) WITH PROPOFOL;  Surgeon: Ladene Artist, MD;  Location: [facility] ENDOSCOPY;  Service: Endoscopy;  Laterality: N/A;  . TONSILLECTOMY AND ADENOIDECTOMY     65 years old  . VASECTOMY      There were no vitals filed for this visit.      Subjective Assessment - 10/19/15 1118    Subjective Patient reports that he has not been able to sleep well and reports stressors from bathroom remodeling and  roofing repairs going on at his home currently. Patient reports that he did have a few bad days this past week with complaints of dizziness and imbalance, but patient states he feels it was due to stress and not sleeping right.     Pertinent History  Pt reports that he started feeling dizzy 3-4 months ago. He states that he was walking down grocery aisles and would fall over, most frequently to the left. 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 Pt reports that these symptoms have improved and only happen once/month at this point. However he still has "dizziness" when he gets up out of bed at night. Pt states that when he first sits up he gets dizzy for 2-3 minutes. Once he is upright his symptoms go away within a couple minutes. Pt has seen ENT and had a VNG as well as MRI studies. 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 Pt denies any tremors, shuffling, or freezing episodes.   Diagnostic tests MRI, see history    Patient Stated Goals Decrease symptoms and return to work.    Currently in Pain? Yes   Pain Score 8    Pain Location Knee  and shoulders   Pain Orientation Right;Left   Pain Type Chronic pain  reports he has not been able to take pain medications because of iron levels      Neuromuscular Re-education:  Performed  ABC scale and DHI functional outcome measures. Discussed test results and compared to prior testing. Discussed plan of care and goals.   FUNCTIONAL OUTCOME MEASURES:  Results Comments  DHI 72 Severe perception of handicap; in need of intervention  ABC Scale 31.25% High falls risk; in need of intervention   Ball circles: Performed standing on one leg, while lightly placing other foot on soccer ball while doing ball circles 10 reps and then switching legs. Performed 2 sets of this exercise.  On firm surface and then on Airex pad,  performed slow marching with 5 second holds 20 reps with and without horiz head turns.  On firm surface and then on Airex pad, performed mini-squats with and without horiz head turns about 20 reps.          PT Long Term Goals - 10/19/15 1304      PT LONG TERM GOAL #1   Title Pt will be independent with HEP in order to decrease symptoms, improve balance, and decrease risk for falls.   Time 6   Period Weeks   Status Achieved     PT LONG TERM GOAL #2   Title Pt will decrease DHI by at least 18 points in order to demonstrate clinically significant reduction in dizziness   Baseline 09/14/15: 94/100; scored 72 on 10/19/2015   Time 6   Period Weeks   Status Achieved     PT LONG TERM GOAL #3   Title Pt will improve ABC by at least 13% in order to demonstrate clinically significant improvement in balance confidence   Baseline 09/14/15: 18.125%; scored 31.25% on 10/19/2015   Time 6   Period Weeks   Status New     PT LONG TERM GOAL #4   Title Pt will be able to ambulate with horizontal head turns and no lateral gait deviation to improve his ability to safely walk through the grocery store   Time 6   Period Weeks   Status Achieved     PT LONG TERM GOAL #5   Title Pt will improve ABC score to 60% or greater in order to demonstrate clinically significant improvement in balance confidence.   Baseline 10/19/2015 31.25%   Time 4   Period Weeks   Status New     Additional Long Term Goals   Additional Long Term Goals Yes     PT LONG TERM GOAL #6   Title Pt will decrease DHI by at least 18 points in order to demonstrate clinically significant reduction in dizziness   Baseline scored 72/100 on 10/19/2015   Time 4   Period Weeks   Status New               Plan - 10/19/15 1307    Clinical Impression Statement Patient reports that this past week he has had two days where he had episodes  of dizziness. In addition, pt reports that he has had difficulty sleeping and increased life  stressors which he feels impacted his dizziness symptoms. Repeated functional outcome testing this date and patient improved from 12 % to 31.25% on the ABC scale and improved from 94/100 to 72/100 on the [facility] inventory. Patient has made improvements in improved confidence with his balance and decreased dizziness however, these scores indicate further intervention is needed to try to help decrease his falls risk, improve his balance  and reduce dizziness symptoms. Will update goals and plan on continuing PT services at this time to further address these deficits.    Rehab Potential Good   Clinical Impairments Affecting Rehab Potential Positive: improving symptoms; Negative: orthostatic hypotension   PT Frequency 1x / week   PT Duration 6 weeks   PT Treatment/Interventions Aquatic Therapy;Canalith Repostioning;Cryotherapy;Electrical Stimulation;Iontophoresis 4mg /ml Dexamethasone;Moist Heat;Traction;DME Instruction;Gait training;Stair training;Therapeutic activities;Therapeutic exercise;Balance training;Neuromuscular re-education;Patient/family education;Manual techniques;Vestibular   PT Next Visit Plan Consider body wall rolls, step taps to cones    PT Home Exercise Plan Semi-tandem eyes closed, Semitandem with horizontal/vertical head and body turns, tandem gait; Feet together EC, slow marching with head turns and mini-squats with head turns   Consulted and Agree with Plan of Care Patient      Patient will benefit from skilled therapeutic intervention in order to improve the following deficits and impairments:  Decreased balance, Dizziness  Visit Diagnosis: Dizziness and giddiness  Unsteadiness on feet     Problem List Patient Active Problem List   Diagnosis Date Noted  . Somnolence, daytime 09/10/2015  . Loss of weight 09/10/2015  . Alcoholic cirrhosis of liver without ascites ([place]) 08/06/2015  . Idiopathic esophageal varices with bleeding ([place]) 08/06/2015  . Hematemesis with nausea   .  Secondary esophageal varices with bleeding ([place])   . UGIB (upper gastrointestinal bleed) 07/23/2015  . Alcoholic cirrhosis ([place]) [id number]  . GI bleed 07/23/2015  . Liver cirrhosis ([place]) 06/19/2015  . GERD (gastroesophageal reflux disease) 03/22/2015  . Abdominal pain 03/22/2015  . Personal history of colonic polyps 04/27/2013  . Depression 02/17/2013  . Hyperglycemia 07/23/2011  . OSA (obstructive sleep apnea) 04/10/2011  . Testosterone deficiency 03/06/2011  . Degenerative disc disease, lumbar 05/09/2010  . OTHER NONSPECIFIC ABNORMAL SERUM ENZYME LEVELS 05/23/2009  . Essential hypertension, benign 09/01/2007  . COLONIC POLYPS 10/23/2006  . HLD (hyperlipidemia) 10/23/2006  . PUD 10/23/2006  . 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 CARPAL TUNNEL SYNDROME, BILATERAL, HX OF 10/23/2006  . BENIGN PROSTATIC HYPERTROPHY, HX OF 10/23/2006   Lady Deutscher PT, DPT Lady Deutscher 10/19/2015, 4:07 PM  [place] MAIN [facility] SERVICES [address] [place], [place], [address] Phone: [phone]   Fax:  [phone]  Name: Jeff Ward MRN: [id number] Date of Birth: 01/16/1951

## 2015-10-23 ENCOUNTER — Telehealth: Payer: Self-pay

## 2015-10-23 ENCOUNTER — Telehealth: Payer: Self-pay | Admitting: Family Medicine

## 2015-10-23 NOTE — Telephone Encounter (Signed)
Pt brought by long list of meds to be refilled except for one that Dr Deborra Medina has already filled the request should go to Dr Fuller Plan; pt voiced understanding and will contact pharmacy.

## 2015-10-23 NOTE — Telephone Encounter (Signed)
Opened in error

## 2015-10-24 ENCOUNTER — Other Ambulatory Visit: Payer: Self-pay | Admitting: Gastroenterology

## 2015-10-24 NOTE — Telephone Encounter (Signed)
Left message on machine that Rx is available for pickup at the pharmacy.

## 2015-10-26 ENCOUNTER — Other Ambulatory Visit (INDEPENDENT_AMBULATORY_CARE_PROVIDER_SITE_OTHER): Payer: Medicare Other

## 2015-10-26 ENCOUNTER — Other Ambulatory Visit: Payer: Self-pay | Admitting: Family Medicine

## 2015-10-26 ENCOUNTER — Encounter: Payer: Medicare Other | Admitting: Physical Therapy

## 2015-10-26 DIAGNOSIS — K7030 Alcoholic cirrhosis of liver without ascites: Secondary | ICD-10-CM

## 2015-10-26 DIAGNOSIS — R188 Other ascites: Secondary | ICD-10-CM | POA: Diagnosis not present

## 2015-10-26 LAB — COMPREHENSIVE METABOLIC PANEL
ALT: 17 U/L (ref 0–53)
AST: 34 U/L (ref 0–37)
Albumin: 3.5 g/dL (ref 3.5–5.2)
Alkaline Phosphatase: 53 U/L (ref 39–117)
BUN: 23 mg/dL (ref 6–23)
CHLORIDE: 107 meq/L (ref 96–112)
CO2: 27 meq/L (ref 19–32)
CREATININE: 1.29 mg/dL (ref 0.40–1.50)
Calcium: 9.4 mg/dL (ref 8.4–10.5)
GFR: 59.19 mL/min — ABNORMAL LOW (ref >60.00)
GLUCOSE: 108 mg/dL — AB (ref 70–99)
Potassium: 4.3 mEq/L (ref 3.5–5.1)
SODIUM: 138 meq/L (ref 135–145)
Total Bilirubin: 0.5 mg/dL (ref 0.2–1.2)
Total Protein: 7.1 g/dL (ref 6.0–8.3)

## 2015-10-26 LAB — CBC
HEMATOCRIT: 32.3 % — AB (ref 39.0–52.0)
Hemoglobin: 10.8 g/dL — ABNORMAL LOW (ref 13.0–17.0)
MCHC: 33.4 g/dL (ref 30.0–36.0)
MCV: 90.9 fl (ref 78.0–100.0)
Platelets: 166 10*3/uL (ref 150.0–400.0)
RBC: 3.56 Mil/uL — AB (ref 4.22–5.81)
RDW: 19.5 % — AB (ref 11.5–15.5)
WBC: 4.9 10*3/uL (ref 4.0–10.5)

## 2015-10-26 NOTE — Addendum Note (Signed)
Addended by: Marchia Bond on: 10/26/2015 09:26 AM   Modules accepted: Orders

## 2015-10-27 LAB — AMMONIA: AMMONIA: 85 umol/L — AB (ref <=47)

## 2015-11-01 ENCOUNTER — Other Ambulatory Visit: Payer: Medicare Other

## 2015-11-02 ENCOUNTER — Encounter: Payer: Medicare Other | Admitting: Physical Therapy

## 2015-11-02 ENCOUNTER — Other Ambulatory Visit: Payer: Self-pay

## 2015-11-02 MED ORDER — LACTULOSE 10 GM/15ML PO SOLN: 20.0000 g | Freq: Three times a day (TID) | ORAL | 11 refills | Status: DC

## 2015-11-02 MED ORDER — PANTOPRAZOLE SODIUM 40 MG PO TBEC: 40.0000 mg | DELAYED_RELEASE_TABLET | Freq: Two times a day (BID) | ORAL | 2 refills | Status: DC

## 2015-11-02 MED ORDER — SPIRONOLACTONE 50 MG PO TABS: 100.0000 mg | ORAL_TABLET | Freq: Every day | ORAL | 6 refills | Status: DC

## 2015-11-06 DIAGNOSIS — L821 Other seborrheic keratosis: Secondary | ICD-10-CM | POA: Diagnosis not present

## 2015-11-06 DIAGNOSIS — L570 Actinic keratosis: Secondary | ICD-10-CM | POA: Diagnosis not present

## 2015-11-06 DIAGNOSIS — L298 Other pruritus: Secondary | ICD-10-CM | POA: Diagnosis not present

## 2015-11-06 DIAGNOSIS — B078 Other viral warts: Secondary | ICD-10-CM | POA: Diagnosis not present

## 2015-11-07 ENCOUNTER — Encounter: Payer: Self-pay | Admitting: Family Medicine

## 2015-11-07 ENCOUNTER — Ambulatory Visit (INDEPENDENT_AMBULATORY_CARE_PROVIDER_SITE_OTHER): Payer: Medicare Other | Admitting: Family Medicine

## 2015-11-07 VITALS — BP 128/76 | HR 56 | Temp 97.9°F | Wt 174.0 lb

## 2015-11-07 DIAGNOSIS — I8501 Esophageal varices with bleeding: Secondary | ICD-10-CM

## 2015-11-07 DIAGNOSIS — K7469 Other cirrhosis of liver: Secondary | ICD-10-CM | POA: Diagnosis not present

## 2015-11-07 MED ORDER — FOLIC ACID 1 MG PO TABS: 1.0000 mg | ORAL_TABLET | Freq: Every day | ORAL | 3 refills | Status: DC

## 2015-11-07 NOTE — Progress Notes (Signed)
Pre visit review using our clinic review tool, if applicable. No additional management support is needed unless otherwise documented below in the visit note. 

## 2015-11-07 NOTE — Assessment & Plan Note (Signed)
Less somnolence. Labs improving. Repeat ammonia, CBC in 2-3 weeks.

## 2015-11-07 NOTE — Patient Instructions (Addendum)
Great to see you. Let's recheck your labs in a couple of weeks.

## 2015-11-07 NOTE — Progress Notes (Signed)
Subjective:   Patient ID: Jeff Ward, male    DOB: March 30, 1950, 65 y.o.   MRN: [id number]  Jeff Ward is a pleasant 65 y.o. year old male who presents to clinic today with his son for Follow-up (Lab Results)  on 11/07/2015  HPI:  Recent diagnosis ofesophageal varices, hepatic encephalopathy, and ascites. He was recently hospitalized at [facility] for upper GI bleed on June 26. He underwent EGD on June 27 by Dr. Allen Norris where he was found to have grade 3 bleeding esophageal varices, incompletely eradicated/banded with clotted blood in the entire stomach. It appears that he was given octreotide, PPI drip, and Rocephin antibiotics while he was there. Received at least 2 units of packed red blood cells. He is now on Cipro 500 mg twice a day that appears that they placed him on for SBP prophylaxis. He is on iron sulfate twice a day for this is making his stools dark in color. No more hematemesis.   S/p EGD with propofol as surgical intervention for varices on 08/14/15. EGD reviewed- varcies banded.  Since discharge, progressive daytime somnolence and weight loss.  Saw Dr. Fuller Plan on 09/26/15- note reviewed. Advised the following:  1. 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 CNadolol 20 mg daily. Lactulose 10 g tid. Lasix 20 mg daily. Aldactone 100 mg daily. Follow daily weight-call for significant increase or decrease. EGD in 1 year. REV in 6 months with me. Interim follow with Dr. Deborra Medina.   2. Anemia. Increase Fe to tid with meals as recommended by Dr. Deborra Medina.   Ammonia remains elevated but improved.  He is now taking lactulose and iron three times daily.  Still complaining of daytime somnolence.  Appetite has improved. Current Outpatient Prescriptions on File Prior to Visit  Medication Sig Dispense Refill  . EPINEPHrine (EPIPEN 2-PAK) 0.3 mg/0.3 mL IJ SOAJ injection Inject 0.3 mg into the muscle once as needed (for severe allergic  reaction).    . fenofibrate (TRICOR) 145 MG tablet Take 145 mg by mouth every morning.     . ferrous sulfate 325 (65 FE) MG tablet Take 1 tablet (325 mg total) by mouth 3 (three) times daily with meals. 90 tablet 5  . folic acid (FOLVITE) 1 MG tablet Take 1 tablet (1 mg total) by mouth daily. (Patient taking differently: Take 1 mg by mouth every morning. ) 30 tablet 0  . furosemide (LASIX) 20 MG tablet Take 1 tablet (20 mg total) by mouth daily. 30 tablet 0  . glucosamine-chondroitin 500-400 MG tablet Take 1 tablet by mouth 2 (two) times daily.      Marland Kitchen lactulose (CHRONULAC) 10 GM/15ML solution Take 30 mLs (20 g total) by mouth 3 (three) times daily. 1892 mL 11  . mometasone (ELOCON) 0.1 % lotion Place 4 drops into both ears 2 (two) times daily. For dry skin  0  . Multiple Vitamin (MULTIVITAMIN WITH MINERALS) TABS tablet Take 1 tablet by mouth daily.    . nadolol (CORGARD) 20 MG tablet Take 1 tablet (20 mg total) by mouth daily. 30 tablet 2  . pantoprazole (PROTONIX) 40 MG tablet Take 1 tablet (40 mg total) by mouth 2 (two) times daily. 60 tablet 2  . potassium chloride SA (K-DUR,KLOR-CON) 20 MEQ tablet Take 1 tablet (20 mEq total) by mouth 2 (two) times daily. 90 tablet 1  . spironolactone (ALDACTONE) 50 MG tablet Take 2 tablets (100 mg total) by mouth daily. 30 tablet 6  .  tetrahydrozoline 0.05 % ophthalmic solution Place 1 drop into both eyes daily.    Marland Kitchen thiamine 100 MG tablet Take 1 tablet (100 mg total) by mouth 2 (two) times daily. 60 tablet 0  . TWINRIX 720-20 injection Inject 1 mL into the muscle See admin instructions. 3 doses : 2nd dose given 1 month from 1st dose and 3rd dose given 6 months from 1st dose     No current facility-administered medications on file prior to visit.     Allergies  Allergen Reactions  . Bee Venom Anaphylaxis  . Sulfa Antibiotics Other (See Comments)    Reaction:  Fainting     Past Medical History:  Diagnosis Date  . Arthritis    In back and all joints    . Chronic headache    now resolved  . 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 Depression   . Esophageal varices ([place])   . GERD (gastroesophageal reflux disease)   . GI bleed   . Hiatal hernia   . History of kidney stones 1970's  . Hyperlipidemia 06/2002  . Hypertension   . Psoriasis     Past Surgical History:  Procedure Laterality Date  . CARPAL TUNNEL RELEASE  2005    / right hand  . ESOPHAGOGASTRODUODENOSCOPY N/A 07/24/2015   Procedure: ESOPHAGOGASTRODUODENOSCOPY (EGD);  Surgeon: Lucilla Lame, MD;  Location: [facility] ENDOSCOPY;  Service: Endoscopy;  Laterality: N/A;  Banding  . ESOPHAGOGASTRODUODENOSCOPY (EGD) WITH PROPOFOL N/A 08/14/2015   Procedure: ESOPHAGOGASTRODUODENOSCOPY (EGD) WITH PROPOFOL;  Surgeon: Ladene Artist, MD;  Location: [facility] ENDOSCOPY;  Service: Endoscopy;  Laterality: N/A;  . TONSILLECTOMY AND ADENOIDECTOMY     65 years old  . VASECTOMY      Family History  Problem Relation Age of Onset  . Melanoma Father   . Breast cancer Paternal Aunt   . Alcohol abuse Maternal Grandfather     Social History   Social History  . Marital status: Married    Spouse name: N/A  . Number of children: 2  . Years of education: N/A   Occupational History  . [employer]-Maintence     currently unemployed  .  [place]   Social History Main Topics  . Smoking status: Former Smoker    Years: 35.00    Types: Cigars    Quit date: 04/02/2010  . Smokeless tobacco: Never Used     Comment: smoked cigars 1 to 2 daily  . Alcohol use No  . Drug use: No  . Sexual activity: Yes    Partners: Female     Comment: declined condoms   Other Topics Concern  . Not on file   Social History Narrative  . No narrative on file   The PMH, PSH, Social History, Family History, Medications, and allergies have been reviewed in [facility], and have been updated if relevant.  Review of Systems  Constitutional: Positive for fatigue. Negative for appetite change and  unexpected weight change.  Respiratory: Negative.   Cardiovascular: Negative.   Musculoskeletal: Negative.   Neurological: Positive for weakness. Negative for dizziness, tremors, seizures, syncope, facial asymmetry, speech difficulty, light-headedness, numbness and headaches.  Hematological: Negative.   Psychiatric/Behavioral: Negative.   All other systems reviewed and are negative.      Objective:    BP 128/76 (BP Location: Left Arm, Patient Position: Sitting, Cuff Size: Normal)   Pulse (!) 56   Temp 97.9 F (36.6 C) (Oral)   Wt 174 lb (78.9 kg)  SpO2 99%   BMI 28.96 kg/m   Wt Readings from Last 3 Encounters:  11/07/15 174 lb (78.9 kg)  10/09/15 168 lb (76.2 kg)  09/26/15 168 lb (76.2 kg)    Physical Exam  Constitutional: He is oriented to person, place, and time. He appears well-developed and well-nourished. No distress.  HENT:  Head: Normocephalic.  Eyes: Conjunctivae are normal.  Pulmonary/Chest: Effort normal.  Abdominal: Soft. He exhibits no distension. There is no tenderness. There is no rebound.  Musculoskeletal: Normal range of motion.  Neurological: He is alert and oriented to person, place, and time. No cranial nerve deficit.  Skin: Skin is warm and dry. He is not diaphoretic.  Psychiatric: He has a normal mood and affect. His behavior is normal. Judgment and thought content normal.  Nursing note and vitals reviewed.         Assessment & Plan:   Other cirrhosis of liver ([facility])  Idiopathic esophageal varices with bleeding ([place]) No Follow-up on file.

## 2015-11-14 ENCOUNTER — Other Ambulatory Visit: Payer: Self-pay | Admitting: Family Medicine

## 2015-11-14 ENCOUNTER — Encounter: Payer: Self-pay | Admitting: Family Medicine

## 2015-11-14 DIAGNOSIS — K7030 Alcoholic cirrhosis of liver without ascites: Secondary | ICD-10-CM

## 2015-11-15 ENCOUNTER — Telehealth: Payer: Self-pay | Admitting: Family Medicine

## 2015-11-15 DIAGNOSIS — M1990 Unspecified osteoarthritis, unspecified site: Secondary | ICD-10-CM

## 2015-11-15 NOTE — Telephone Encounter (Signed)
Pt called - he is needing referral to dr Cristi Loron Fax number is [phone] cb number is [phone] for pt  Thanks

## 2015-11-15 NOTE — Telephone Encounter (Signed)
Referral placed.

## 2015-11-16 ENCOUNTER — Encounter: Payer: Self-pay | Admitting: Family Medicine

## 2015-11-19 ENCOUNTER — Telehealth: Payer: Self-pay

## 2015-11-19 MED ORDER — LACTULOSE 10 GM/15ML PO SOLN: 20.0000 g | Freq: Three times a day (TID) | ORAL | 2 refills | Status: DC

## 2015-11-19 MED ORDER — PANTOPRAZOLE SODIUM 40 MG PO TBEC: 40.0000 mg | DELAYED_RELEASE_TABLET | Freq: Two times a day (BID) | ORAL | 2 refills | Status: DC

## 2015-11-19 MED ORDER — SPIRONOLACTONE 50 MG PO TABS: 100.0000 mg | ORAL_TABLET | Freq: Every day | ORAL | 2 refills | Status: DC

## 2015-11-19 NOTE — Telephone Encounter (Signed)
Yes ok for pt to receive prevnar 13.

## 2015-11-19 NOTE — Telephone Encounter (Signed)
Pt asked Jeff Ward at front desk if could get pneumonia shot and TD when comes for labs on 11/21/15. Which pneumonia shot do you want pt to have; no documentation on immunization list of pneumonia vaccine.Please advise.

## 2015-11-20 ENCOUNTER — Encounter: Payer: Self-pay | Admitting: Physical Therapy

## 2015-11-20 DIAGNOSIS — R2681 Unsteadiness on feet: Secondary | ICD-10-CM

## 2015-11-20 DIAGNOSIS — R42 Dizziness and giddiness: Secondary | ICD-10-CM

## 2015-11-20 NOTE — Therapy (Signed)
[place] MAIN [facility] SERVICES [address] [place], [place], [address] Phone: [phone]   Fax:  [phone]  November 20, 2015   @[email or web address] @  Physical Therapy Discharge Summary  Patient: Jeff Ward  MRN: [id number]  Date of Birth: Sep 06, 1950   Diagnosis:  Dizziness and giddiness  Unsteadiness on feet Referring Provider: Carloyn Manner  The above patient had been seen in Physical Therapy 5 times of 7 treatments scheduled with 0 no shows.  The treatment consisted of patient education, neuromuscular re-education, gait training, balance training and vestibular therapy. The patient is: Improved  Subjective: Patient reports that his dizziness symptoms are better. Patient states he did have one episode of dizziness yesterday when he turned quickly and states he has only had this one episode of dizziness  since last seen in the clinic on 10/19/2015. Patient in agreement with discharge from physical therapy services at this time.   Functional Status at Discharge: Note: Patient was last seen in the clinic on 10/19/2015 at which time functional outcome testing was performed and the findings were noted as follows: Repeated functional outcome testing this date and patient improved from 12 % to 31.25% on the ABC scale and improved from 94/100 to 72/100 on the [facility] inventory. Patient has made improvements in improved confidence with his balance and decreased dizziness however, these scores indicate further intervention is needed to try to help decrease his falls risk, improve his balance and reduce dizziness symptoms. Will update goals and plan on continuing PT services at this time to further address these deficits. Patient had met 4/4 goals as set on initial evaluation as of 10/19/2015 however since functional outcome testing indicated further intervention was needed added 2 additional goals were added to the plan of care. Unable to assess progress towards  those goals secondary to patient did not return to clinic.   Patient however was going out of town for a few weeks and therefore cancelled several physical therapy appointments. Called patient on this date 11/20/2015 and spoke with patient over the phone. Patient reports that he is doing fairly well in regards to his dizziness symptoms. Patient states that he had only one brief episode of dizziness after a quick turn. Patient in agreement with discharge from PT services at this time and is independent with his home exercise program.     Goals: 4/4 goals as set on initial evaluation were achieved and 2/2 goals as set on 10/19/2015 unable to assess progress secondary to patient did not return to clinic.    Sincerely,  Lady Deutscher PT, DPT Lady Deutscher, PT   CC [email or web address] @  [place] [address] [place], [place], [address] Phone: [phone]   Fax:  [phone]  Patient: Jeff Ward  MRN: [id number]  Date of Birth: [birth year]

## 2015-11-20 NOTE — Addendum Note (Signed)
Addended by: Daralene Milch C on: 11/20/2015 01:30 PM   Modules accepted: Orders

## 2015-11-21 ENCOUNTER — Ambulatory Visit (INDEPENDENT_AMBULATORY_CARE_PROVIDER_SITE_OTHER): Payer: Medicare Other

## 2015-11-21 ENCOUNTER — Other Ambulatory Visit (INDEPENDENT_AMBULATORY_CARE_PROVIDER_SITE_OTHER): Payer: Medicare Other

## 2015-11-21 DIAGNOSIS — K7030 Alcoholic cirrhosis of liver without ascites: Secondary | ICD-10-CM

## 2015-11-21 DIAGNOSIS — Z23 Encounter for immunization: Secondary | ICD-10-CM | POA: Diagnosis not present

## 2015-11-21 LAB — COMPREHENSIVE METABOLIC PANEL
ALBUMIN: 4.1 g/dL (ref 3.5–5.2)
ALK PHOS: 52 U/L (ref 39–117)
ALT: 25 U/L (ref 0–53)
AST: 44 U/L — ABNORMAL HIGH (ref 0–37)
BILIRUBIN TOTAL: 0.6 mg/dL (ref 0.2–1.2)
BUN: 53 mg/dL — AB (ref 6–23)
CO2: 25 mEq/L (ref 19–32)
Calcium: 10.4 mg/dL (ref 8.4–10.5)
Chloride: 102 mEq/L (ref 96–112)
Creatinine, Ser: 2.3 mg/dL — ABNORMAL HIGH (ref 0.40–1.50)
GFR: 30.36 mL/min — AB (ref >60.00)
GLUCOSE: 174 mg/dL — AB (ref 70–99)
Potassium: 4.2 mEq/L (ref 3.5–5.1)
SODIUM: 136 meq/L (ref 135–145)
TOTAL PROTEIN: 7.9 g/dL (ref 6.0–8.3)

## 2015-11-21 LAB — CBC
HCT: 36.1 % — ABNORMAL LOW (ref 39.0–52.0)
HEMOGLOBIN: 12.1 g/dL — AB (ref 13.0–17.0)
MCHC: 33.4 g/dL (ref 30.0–36.0)
MCV: 93.2 fl (ref 78.0–100.0)
Platelets: 138 10*3/uL — ABNORMAL LOW (ref 150.0–400.0)
RBC: 3.88 Mil/uL — AB (ref 4.22–5.81)
RDW: 17.5 % — AB (ref 11.5–15.5)
WBC: 5.3 10*3/uL (ref 4.0–10.5)

## 2015-11-21 NOTE — Progress Notes (Signed)
Pre visit review using our clinic review tool, if applicable. No additional management support is needed unless otherwise documented below in the visit note. 

## 2015-11-22 DIAGNOSIS — M159 Polyosteoarthritis, unspecified: Secondary | ICD-10-CM | POA: Diagnosis not present

## 2015-11-22 DIAGNOSIS — M19041 Primary osteoarthritis, right hand: Secondary | ICD-10-CM | POA: Diagnosis not present

## 2015-11-22 DIAGNOSIS — N183 Chronic kidney disease, stage 3 (moderate): Secondary | ICD-10-CM | POA: Diagnosis not present

## 2015-11-22 DIAGNOSIS — M19042 Primary osteoarthritis, left hand: Secondary | ICD-10-CM | POA: Diagnosis not present

## 2015-11-22 DIAGNOSIS — M5136 Other intervertebral disc degeneration, lumbar region: Secondary | ICD-10-CM | POA: Diagnosis not present

## 2015-11-22 DIAGNOSIS — M47816 Spondylosis without myelopathy or radiculopathy, lumbar region: Secondary | ICD-10-CM | POA: Diagnosis not present

## 2015-11-22 DIAGNOSIS — K7469 Other cirrhosis of liver: Secondary | ICD-10-CM | POA: Diagnosis not present

## 2015-11-22 LAB — AMMONIA: AMMONIA: 144 umol/L — AB (ref <=47)

## 2015-11-23 ENCOUNTER — Encounter: Payer: Self-pay | Admitting: *Deleted

## 2015-11-23 ENCOUNTER — Inpatient Hospital Stay
Admission: EM | Admit: 2015-11-23 | Discharge: 2015-11-24 | DRG: 684 | Disposition: A | Discharge status: Home / Self Care | Payer: Medicare Other | Attending: Internal Medicine | Admitting: Internal Medicine

## 2015-11-23 DIAGNOSIS — Z882 Allergy status to sulfonamides status: Secondary | ICD-10-CM

## 2015-11-23 DIAGNOSIS — Z91030 Bee allergy status: Secondary | ICD-10-CM

## 2015-11-23 DIAGNOSIS — E785 Hyperlipidemia, unspecified: Secondary | ICD-10-CM | POA: Diagnosis present

## 2015-11-23 DIAGNOSIS — I10 Essential (primary) hypertension: Secondary | ICD-10-CM | POA: Diagnosis not present

## 2015-11-23 DIAGNOSIS — Z86010 Personal history of colonic polyps: Secondary | ICD-10-CM

## 2015-11-23 DIAGNOSIS — N179 Acute kidney failure, unspecified: Secondary | ICD-10-CM | POA: Diagnosis not present

## 2015-11-23 DIAGNOSIS — M4690 Unspecified inflammatory spondylopathy, site unspecified: Secondary | ICD-10-CM | POA: Diagnosis not present

## 2015-11-23 DIAGNOSIS — K219 Gastro-esophageal reflux disease without esophagitis: Secondary | ICD-10-CM | POA: Diagnosis present

## 2015-11-23 DIAGNOSIS — K7460 Unspecified cirrhosis of liver: Secondary | ICD-10-CM | POA: Diagnosis not present

## 2015-11-23 DIAGNOSIS — K7030 Alcoholic cirrhosis of liver without ascites: Secondary | ICD-10-CM | POA: Diagnosis present

## 2015-11-23 DIAGNOSIS — Z79899 Other long term (current) drug therapy: Secondary | ICD-10-CM

## 2015-11-23 DIAGNOSIS — K7290 Hepatic failure, unspecified without coma: Secondary | ICD-10-CM | POA: Diagnosis not present

## 2015-11-23 DIAGNOSIS — Z87442 Personal history of urinary calculi: Secondary | ICD-10-CM | POA: Diagnosis not present

## 2015-11-23 DIAGNOSIS — M1990 Unspecified osteoarthritis, unspecified site: Secondary | ICD-10-CM | POA: Diagnosis not present

## 2015-11-23 DIAGNOSIS — Z87891 Personal history of nicotine dependence: Secondary | ICD-10-CM

## 2015-11-23 DIAGNOSIS — N182 Chronic kidney disease, stage 2 (mild): Secondary | ICD-10-CM | POA: Diagnosis present

## 2015-11-23 DIAGNOSIS — R42 Dizziness and giddiness: Secondary | ICD-10-CM | POA: Diagnosis not present

## 2015-11-23 HISTORY — DX: Chronic kidney disease, unspecified: N18.9

## 2015-11-23 LAB — COMPREHENSIVE METABOLIC PANEL
ALK PHOS: 51 U/L (ref 38–126)
ALT: 31 U/L (ref 17–63)
ANION GAP: 10 (ref 5–15)
AST: 59 U/L — ABNORMAL HIGH (ref 15–41)
Albumin: 4.4 g/dL (ref 3.5–5.0)
BILIRUBIN TOTAL: 0.9 mg/dL (ref 0.3–1.2)
BUN: 53 mg/dL — ABNORMAL HIGH (ref 6–20)
CALCIUM: 10.1 mg/dL (ref 8.9–10.3)
CO2: 24 mmol/L (ref 22–32)
Chloride: 103 mmol/L (ref 101–111)
Creatinine, Ser: 2.27 mg/dL — ABNORMAL HIGH (ref 0.61–1.24)
GFR calc Af Amer: 33 mL/min — ABNORMAL LOW (ref >60)
GFR, EST NON AFRICAN AMERICAN: 28 mL/min — AB (ref >60)
Glucose, Bld: 147 mg/dL — ABNORMAL HIGH (ref 65–99)
POTASSIUM: 3.9 mmol/L (ref 3.5–5.1)
Sodium: 137 mmol/L (ref 135–145)
TOTAL PROTEIN: 8.8 g/dL — AB (ref 6.5–8.1)

## 2015-11-23 LAB — URINALYSIS COMPLETE WITH MICROSCOPIC (ARMC ONLY)
BILIRUBIN URINE: NEGATIVE
Bacteria, UA: NONE SEEN
GLUCOSE, UA: NEGATIVE mg/dL
Ketones, ur: NEGATIVE mg/dL
LEUKOCYTES UA: NEGATIVE
NITRITE: NEGATIVE
Protein, ur: NEGATIVE mg/dL
RBC / HPF: NONE SEEN RBC/hpf (ref 0–5)
SPECIFIC GRAVITY, URINE: 1.008 (ref 1.005–1.030)
SQUAMOUS EPITHELIAL / LPF: NONE SEEN
pH: 5 (ref 5.0–8.0)

## 2015-11-23 LAB — CBC WITH DIFFERENTIAL/PLATELET
Basophils Absolute: 0.1 10*3/uL (ref 0–0.1)
Basophils Relative: 1 %
EOS ABS: 0.4 10*3/uL (ref 0–0.7)
Eosinophils Relative: 8 %
HEMATOCRIT: 38.3 % — AB (ref 40.0–52.0)
HEMOGLOBIN: 13.2 g/dL (ref 13.0–18.0)
LYMPHS ABS: 1.4 10*3/uL (ref 1.0–3.6)
LYMPHS PCT: 31 %
MCH: 32.2 pg (ref 26.0–34.0)
MCHC: 34.4 g/dL (ref 32.0–36.0)
MCV: 93.5 fL (ref 80.0–100.0)
MONOS PCT: 15 %
Monocytes Absolute: 0.7 10*3/uL (ref 0.2–1.0)
NEUTROS PCT: 45 %
Neutro Abs: 2.1 10*3/uL (ref 1.4–6.5)
Platelets: 123 10*3/uL — ABNORMAL LOW (ref 150–440)
RBC: 4.1 MIL/uL — AB (ref 4.40–5.90)
RDW: 17.7 % — ABNORMAL HIGH (ref 11.5–14.5)
WBC: 4.7 10*3/uL (ref 3.8–10.6)

## 2015-11-23 LAB — LIPASE, BLOOD: LIPASE: 40 U/L (ref 11–51)

## 2015-11-23 LAB — AMYLASE: Amylase: 118 U/L — ABNORMAL HIGH (ref 28–100)

## 2015-11-23 LAB — AMMONIA: AMMONIA: 91 umol/L — AB (ref 9–35)

## 2015-11-23 LAB — PROTIME-INR
INR: 1.27
Prothrombin Time: 16 seconds — ABNORMAL HIGH (ref 11.4–15.2)

## 2015-11-23 LAB — MRSA PCR SCREENING: MRSA by PCR: NEGATIVE

## 2015-11-23 MED ORDER — PANTOPRAZOLE SODIUM 40 MG PO TBEC
40.0000 mg | DELAYED_RELEASE_TABLET | Freq: Two times a day (BID) | ORAL | Status: DC
  Administered 2015-11-23: 40 mg via ORAL
  Filled 2015-11-23: qty 1

## 2015-11-23 MED ORDER — HYDROCORTISONE 1 % EX LOTN
TOPICAL_LOTION | Freq: Two times a day (BID) | CUTANEOUS | Status: DC
  Administered 2015-11-23: 22:00:00 via TOPICAL
  Filled 2015-11-23: qty 118

## 2015-11-23 MED ORDER — ACETAMINOPHEN 325 MG PO TABS: 650.0000 mg | ORAL_TABLET | Freq: Four times a day (QID) | ORAL | Status: DC | PRN

## 2015-11-23 MED ORDER — ACETAMINOPHEN 650 MG RE SUPP: 650.0000 mg | Freq: Four times a day (QID) | RECTAL | Status: DC | PRN

## 2015-11-23 MED ORDER — ONDANSETRON HCL 4 MG PO TABS: 4.0000 mg | ORAL_TABLET | Freq: Four times a day (QID) | ORAL | Status: DC | PRN

## 2015-11-23 MED ORDER — GLUCOSAMINE-CHONDROITIN 500-400 MG PO TABS: 1.0000 | ORAL_TABLET | Freq: Two times a day (BID) | ORAL | Status: DC

## 2015-11-23 MED ORDER — FOLIC ACID 1 MG PO TABS: 1.0000 mg | ORAL_TABLET | Freq: Every day | ORAL | Status: DC

## 2015-11-23 MED ORDER — NADOLOL 20 MG PO TABS: 20.0000 mg | ORAL_TABLET | Freq: Every day | ORAL | Status: DC

## 2015-11-23 MED ORDER — LACTULOSE 10 GM/15ML PO SOLN
20.0000 g | Freq: Three times a day (TID) | ORAL | Status: DC
  Administered 2015-11-23: 20 g via ORAL
  Filled 2015-11-23: qty 30

## 2015-11-23 MED ORDER — MOMETASONE FUROATE 0.1 % EX SOLN: 4.0000 [drp] | Freq: Two times a day (BID) | CUTANEOUS | Status: DC

## 2015-11-23 MED ORDER — RIFAXIMIN 550 MG PO TABS
550.0000 mg | ORAL_TABLET | Freq: Two times a day (BID) | ORAL | Status: DC
  Administered 2015-11-23: 550 mg via ORAL
  Filled 2015-11-23: qty 1

## 2015-11-23 MED ORDER — ADULT MULTIVITAMIN W/MINERALS CH: 1.0000 | ORAL_TABLET | Freq: Every day | ORAL | Status: DC

## 2015-11-23 MED ORDER — VITAMIN B-1 100 MG PO TABS
100.0000 mg | ORAL_TABLET | Freq: Two times a day (BID) | ORAL | Status: DC
  Administered 2015-11-23: 100 mg via ORAL
  Filled 2015-11-23: qty 1

## 2015-11-23 MED ORDER — ENOXAPARIN SODIUM 40 MG/0.4ML ~~LOC~~ SOLN
40.0000 mg | SUBCUTANEOUS | Status: DC
  Administered 2015-11-23: 40 mg via SUBCUTANEOUS
  Filled 2015-11-23: qty 0.4

## 2015-11-23 MED ORDER — SODIUM CHLORIDE 0.9 % IV BOLUS (SEPSIS)
500.0000 mL | Freq: Once | INTRAVENOUS | Status: AC
  Administered 2015-11-23: 500 mL via INTRAVENOUS

## 2015-11-23 MED ORDER — ONDANSETRON HCL 4 MG/2ML IJ SOLN: 4.0000 mg | Freq: Four times a day (QID) | INTRAMUSCULAR | Status: DC | PRN

## 2015-11-23 MED ORDER — SODIUM CHLORIDE 0.9 % IV SOLN
INTRAVENOUS | Status: DC
  Administered 2015-11-23: 20:00:00 via INTRAVENOUS

## 2015-11-23 MED ORDER — FERROUS SULFATE 325 (65 FE) MG PO TABS: 325.0000 mg | ORAL_TABLET | Freq: Three times a day (TID) | ORAL | Status: DC

## 2015-11-23 NOTE — ED Provider Notes (Signed)
[facility] Emergency Department Provider Note  ____________________________________________   First MD Initiated Contact with Patient 11/23/15 1641     (approximate)  I have reviewed the triage vital signs and the nursing notes.   HISTORY  Chief Complaint Abnormal Lab   HPI Jeff Ward is a 65 y.o. male with a history of cirrhosis who is presenting to the emergency department today with an abnormal lab value. He was sent in for possible dehydration by his primary care doctor who drew labs 2 days ago and found the patient have an elevated creatinine. The patient says that he has been very thirsty over the past several days and drinking water. Says that he has diarrhea but says this is chronic since he takes lactulose. He says he has had mild feelings of lightheadedness but otherwise has no complaints. Denies any chest pain or shortness of breath.   Past Medical History:  Diagnosis Date  . Arthritis    In back and all joints  . Chronic headache    now resolved  . 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 Depression   . Esophageal varices ([place])   . GERD (gastroesophageal reflux disease)   . GI bleed   . Hiatal hernia   . History of kidney stones 1970's  . Hyperlipidemia 06/2002  . Hypertension   . Psoriasis     Patient Active Problem List   Diagnosis Date Noted  . Somnolence, daytime 09/10/2015  . Loss of weight 09/10/2015  . Alcoholic cirrhosis of liver without ascites ([place]) 08/06/2015  . Idiopathic esophageal varices with bleeding ([place]) 08/06/2015  . Hematemesis with nausea   . Secondary esophageal varices with bleeding ([place])   . UGIB (upper gastrointestinal bleed) 07/23/2015  . Alcoholic cirrhosis ([place]) [id number]  . GI bleed 07/23/2015  . Liver cirrhosis ([place]) 06/19/2015  . GERD (gastroesophageal reflux disease) 03/22/2015  . Abdominal pain 03/22/2015  . Personal history of colonic polyps 04/27/2013  . Depression  02/17/2013  . Hyperglycemia 07/23/2011  . OSA (obstructive sleep apnea) 04/10/2011  . Testosterone deficiency 03/06/2011  . Degenerative disc disease, lumbar 05/09/2010  . OTHER NONSPECIFIC ABNORMAL SERUM ENZYME LEVELS 05/23/2009  . Essential hypertension, benign 09/01/2007  . COLONIC POLYPS 10/23/2006  . HLD (hyperlipidemia) 10/23/2006  . PUD 10/23/2006  . 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 CARPAL TUNNEL SYNDROME, BILATERAL, HX OF 10/23/2006  . BENIGN PROSTATIC HYPERTROPHY, HX OF 10/23/2006    Past Surgical History:  Procedure Laterality Date  . CARPAL TUNNEL RELEASE  2005    / right hand  . ESOPHAGOGASTRODUODENOSCOPY N/A 07/24/2015   Procedure: ESOPHAGOGASTRODUODENOSCOPY (EGD);  Surgeon: Lucilla Lame, MD;  Location[facility] ENDOSCOPY;  Service: Endoscopy;  Laterality: N/A;  Banding  . ESOPHAGOGASTRODUODENOSCOPY (EGD) WITH PROPOFOL N/A 08/14/2015   Procedure: ESOPHAGOGASTRODUODENOSCOPY (EGD) WITH PROPOFOL;  Surgeon: Ladene Artist, MD;  Location: [facility] ENDOSCOPY;  Service: Endoscopy;  Laterality: N/A;  . TONSILLECTOMY AND ADENOIDECTOMY     65 years old  . VASECTOMY      Prior to Admission medications   Medication Sig Start Date End Date Taking? Authorizing Provider  EPINEPHrine (EPIPEN 2-PAK) 0.3 mg/0.3 mL IJ SOAJ injection Inject 0.3 mg into the muscle once as needed (for severe allergic reaction).    Historical Provider, MD  fenofibrate (TRICOR) 145 MG tablet Take 145 mg by mouth every morning.     Historical Provider, MD  ferrous sulfate 325 (65 FE) MG tablet Take 1 tablet (325 mg total) by mouth  3 (three) times daily with meals. 09/26/15   Ladene Artist, MD  folic acid (FOLVITE) 1 MG tablet Take 1 tablet (1 mg total) by mouth daily. 11/07/15   [name], MD  furosemide (LASIX) 20 MG tablet Take 1 tablet (20 mg total) by mouth daily. 09/26/15   Ladene Artist, MD  glucosamine-chondroitin 500-400 MG tablet Take 1 tablet by mouth 2 (two) times daily.      Historical Provider,  MD  lactulose (CHRONULAC) 10 GM/15ML solution Take 30 mLs (20 g total) by mouth 3 (three) times daily. 11/19/15   [name], MD  mometasone (ELOCON) 0.1 % lotion Place 4 drops into both ears 2 (two) times daily. For dry skin 08/02/15   Historical Provider, MD  Multiple Vitamin (MULTIVITAMIN WITH MINERALS) TABS tablet Take 1 tablet by mouth daily.    Historical Provider, MD  nadolol (CORGARD) 20 MG tablet Take 1 tablet (20 mg total) by mouth daily. 08/06/15   Jessica D Zehr, PA-C  pantoprazole (PROTONIX) 40 MG tablet Take 1 tablet (40 mg total) by mouth 2 (two) times daily. 11/19/15   [name], MD  potassium chloride SA (K-DUR,KLOR-CON) 20 MEQ tablet Take 1 tablet (20 mEq total) by mouth 2 (two) times daily. 10/17/15   [name], MD  spironolactone (ALDACTONE) 50 MG tablet Take 2 tablets (100 mg total) by mouth daily. 11/19/15   [name], MD  tetrahydrozoline 0.05 % ophthalmic solution Place 1 drop into both eyes daily.    Historical Provider, MD  thiamine 100 MG tablet Take 1 tablet (100 mg total) by mouth 2 (two) times daily. 07/30/15   Vaughan Basta, MD  Garnet Koyanagi 720-20 injection Inject 1 mL into the muscle See admin instructions. 3 doses : 2nd dose given 1 month from 1st dose and 3rd dose given 6 months from 1st dose 06/14/15   Historical Provider, MD    Allergies Bee venom and Sulfa antibiotics  Family History  Problem Relation Age of Onset  . Melanoma Father   . Breast cancer Paternal Aunt   . Alcohol abuse Maternal Grandfather     Social History Social History  Substance Use Topics  . Smoking status: Former Smoker    Years: 35.00    Types: Cigars    Quit date: 04/02/2010  . Smokeless tobacco: Never Used     Comment: smoked cigars 1 to 2 daily  . Alcohol use No    Review of Systems Constitutional: No fever/chills Eyes: No visual changes. ENT: No sore throat. Cardiovascular: Denies chest pain. Respiratory: Denies shortness of breath. Gastrointestinal: No  abdominal pain.  No nausea, no vomiting.   No constipation. Genitourinary: Negative for dysuria. Musculoskeletal: Negative for back pain. Skin: Negative for rash. Neurological: Negative for headaches, focal weakness or numbness.  10-point ROS otherwise negative.  ____________________________________________   PHYSICAL EXAM:  VITAL SIGNS: ED Triage Vitals  Enc Vitals Group     BP 11/23/15 1559 (!) 124/57     Pulse Rate 11/23/15 1559 (!) 51     Resp 11/23/15 1559 20     Temp 11/23/15 1559 98.2 F (36.8 C)     Temp Source 11/23/15 1559 Oral     SpO2 11/23/15 1559 100 %     Weight 11/23/15 1600 170 lb (77.1 kg)     Height 11/23/15 1600 5\' 5"  (1.651 m)     Head Circumference --      Peak Flow --  Pain Score --      Pain Loc --      Pain Edu? --      Excl. in [place]? --     Constitutional: Alert and oriented. Well appearing and in no acute distress. Eyes: Conjunctivae are normal. PERRL. EOMI. Head: Atraumatic. Nose: No congestion/rhinnorhea. Mouth/Throat: Mucous membranes are moist.   Neck: No stridor.   Cardiovascular: Normal rate, regular rhythm. Grossly normal heart sounds.   Respiratory: Normal respiratory effort.  No retractions. Lungs CTAB. Gastrointestinal: Soft and nontender. No distention.  Musculoskeletal: No lower extremity tenderness nor edema.  No joint effusions. Neurologic:  Normal speech and language. No gross focal neurologic deficits are appreciated.  Skin:  Skin is warm, dry and intact. No rash noted. Psychiatric: Mood and affect are normal. Speech and behavior are normal.  ____________________________________________   LABS (all labs ordered are listed, but only abnormal results are displayed)  Labs Reviewed  AMYLASE - Abnormal; Notable for the following:       Result Value   Amylase 118 (*)    All other components within normal limits  COMPREHENSIVE METABOLIC PANEL - Abnormal; Notable for the following:    Glucose, Bld 147 (*)    BUN 53 (*)      Creatinine, Ser 2.27 (*)    Total Protein 8.8 (*)    AST 59 (*)    GFR calc non Af Amer 28 (*)    GFR calc Af Amer 33 (*)    All other components within normal limits  URINALYSIS COMPLETEWITH MICROSCOPIC ([facility] ONLY) - Abnormal; Notable for the following:    Color, Urine STRAW (*)    APPearance CLEAR (*)    Hgb urine dipstick 1+ (*)    All other components within normal limits  CBC WITH DIFFERENTIAL/PLATELET - Abnormal; Notable for the following:    RBC 4.10 (*)    HCT 38.3 (*)    RDW 17.7 (*)    Platelets 123 (*)    All other components within normal limits  LIPASE, BLOOD   ____________________________________________  EKG   ____________________________________________  RADIOLOGY   ____________________________________________   PROCEDURES  Procedure(s) performed:   Procedures  Critical Care performed:   ____________________________________________   INITIAL IMPRESSION / ASSESSMENT AND PLAN / ED COURSE  Pertinent labs & imaging results that were available during my care of the patient were reviewed by me and considered in my medical decision making (see chart for details).  ----------------------------------------- 5:03 PM on 11/23/2015 -----------------------------------------  Patient with elevated creatinine to 2.27 up from 1.29 month ago. BUN of 53 which is consistent with a prerenal pattern.  I will start the patient with IV fluid hydration. Signed out to Dr. Posey Pronto for admission. I explained the care plan as well as lab results the patient and the family and they're understanding and willing to comply.  Clinical Course     ____________________________________________   FINAL CLINICAL IMPRESSION(S) / ED DIAGNOSES  Acute renal failure.    NEW MEDICATIONS STARTED DURING THIS VISIT:  New Prescriptions   No medications on file     Note:  This document was prepared using Dragon voice recognition software and may include unintentional  dictation errors.    Orbie Pyo, MD 11/23/15 [phone]

## 2015-11-23 NOTE — H&P (Signed)
[place] [facility] [place] NAME: Jeff Ward    MR#:  [id number]  DATE OF BIRTH:  03/26/1950  DATE OF ADMISSION:  11/23/2015  PRIMARY CARE PHYSICIAN: Arnette Norris, MD   REQUESTING/REFERRING PHYSICIAN: Dr. Larae Grooms   CHIEF COMPLAINT:   Chief Complaint  Patient presents with  . Abnormal Lab    HISTORY OF PRESENT ILLNESS:  Jeff Ward  is a 65 y.o. male with a known history of Alcoholic liver cirrhosis with clipping of esophageal varices 5 months ago, CK D with baseline creatinine around 1.3, depression, hypertension presents to hospital secondary to abnormal labs as outpatient and also increased dizziness. Patient had blood work done 2 days ago by PCP, was called in today secondary to his creatinine worsening to 2.3 whereas his baseline is around 1.3. He has been having myalgias for the last 2 days, has had some dizziness on walking. He started drinking plenty of fluids. Denies any dark urine or dysuria. Frequency of urination has increased. Denies any fevers, chills. Has diarrhea from his lactulose. Ammonia on his blood work 2 days ago was 144. He seems alert and oriented. Usually increased ammonia affects his balance.  PAST MEDICAL HISTORY:   Past Medical History:  Diagnosis Date  . Arthritis    In back and all joints  . Chronic headache    now resolved  . CKD (chronic kidney disease)   . 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 Depression   . Esophageal varices ([place])   . GERD (gastroesophageal reflux disease)   . GI bleed   . Hiatal hernia   . History of kidney stones 1970's  . Hyperlipidemia 06/2002  . Hypertension   . Psoriasis     PAST SURGICAL HISTORY:   Past Surgical History:  Procedure Laterality Date  . CARPAL TUNNEL RELEASE  2005    / right hand  . ESOPHAGOGASTRODUODENOSCOPY N/A 07/24/2015   Procedure: ESOPHAGOGASTRODUODENOSCOPY (EGD);  Surgeon: Lucilla Lame, MD;  Location: [facility] ENDOSCOPY;   Service: Endoscopy;  Laterality: N/A;  Banding  . ESOPHAGOGASTRODUODENOSCOPY (EGD) WITH PROPOFOL N/A 08/14/2015   Procedure: ESOPHAGOGASTRODUODENOSCOPY (EGD) WITH PROPOFOL;  Surgeon: Ladene Artist, MD;  Location: [facility] ENDOSCOPY;  Service: Endoscopy;  Laterality: N/A;  . TONSILLECTOMY AND ADENOIDECTOMY     65 years old  . VASECTOMY      SOCIAL HISTORY:   Social History  Substance Use Topics  . Smoking status: Former Smoker    Years: 35.00    Types: Cigars    Quit date: 04/02/2010  . Smokeless tobacco: Never Used     Comment: Quit 4 years ago  . Alcohol use No     Comment: Used to be a heavy drinker, quit for 5 months now    FAMILY HISTORY:   Family History  Problem Relation Age of Onset  . Melanoma Father   . Breast cancer Paternal Aunt   . Alcohol abuse Maternal Grandfather     DRUG ALLERGIES:   Allergies  Allergen Reactions  . Bee Venom Anaphylaxis  . Sulfa Antibiotics Other (See Comments)    Reaction:  Fainting     REVIEW OF SYSTEMS:   Review of Systems  Constitutional: Positive for malaise/fatigue. Negative for chills, fever and weight loss.  HENT: Negative for ear discharge, ear pain, hearing loss and tinnitus.   Eyes: Negative for blurred vision, double vision and photophobia.  Respiratory: Negative for cough, hemoptysis, shortness of breath and wheezing.  Cardiovascular: Negative for chest pain, palpitations, orthopnea and leg swelling.  Gastrointestinal: Negative for abdominal pain, constipation, diarrhea, heartburn, melena, nausea and vomiting.  Genitourinary: Negative for dysuria, frequency and urgency.  Musculoskeletal: Positive for myalgias. Negative for back pain and neck pain.  Skin: Negative for rash.  Neurological: Positive for dizziness. Negative for tingling, sensory change, speech change, focal weakness and headaches.  Endo/Heme/Allergies: Does not bruise/bleed easily.  Psychiatric/Behavioral: Negative for depression.    MEDICATIONS AT HOME:    Prior to Admission medications   Medication Sig Start Date End Date Taking? Authorizing Provider  fenofibrate (TRICOR) 145 MG tablet Take 145 mg by mouth every morning.    Yes Historical Provider, MD  ferrous sulfate 325 (65 FE) MG tablet Take 1 tablet (325 mg total) by mouth 3 (three) times daily with meals. 09/26/15  Yes Ladene Artist, MD  folic acid (FOLVITE) 1 MG tablet Take 1 tablet (1 mg total) by mouth daily. 11/07/15  Yes [name], MD  furosemide (LASIX) 20 MG tablet Take 1 tablet (20 mg total) by mouth daily. 09/26/15  Yes Ladene Artist, MD  glucosamine-chondroitin 500-400 MG tablet Take 1 tablet by mouth 2 (two) times daily.     Yes Historical Provider, MD  lactulose (CHRONULAC) 10 GM/15ML solution Take 30 mLs (20 g total) by mouth 3 (three) times daily. 11/19/15  Yes [name], MD  mometasone (ELOCON) 0.1 % lotion Place 4 drops into both ears 2 (two) times daily. For dry skin 08/02/15  Yes Historical Provider, MD  Multiple Vitamin (MULTIVITAMIN WITH MINERALS) TABS tablet Take 1 tablet by mouth daily.   Yes Historical Provider, MD  nadolol (CORGARD) 20 MG tablet Take 1 tablet (20 mg total) by mouth daily. 08/06/15  Yes Jessica D Zehr, PA-C  pantoprazole (PROTONIX) 40 MG tablet Take 1 tablet (40 mg total) by mouth 2 (two) times daily. 11/19/15  Yes [name], MD  potassium chloride SA (K-DUR,KLOR-CON) 20 MEQ tablet Take 1 tablet (20 mEq total) by mouth 2 (two) times daily. 10/17/15  Yes [name], MD  spironolactone (ALDACTONE) 50 MG tablet Take 2 tablets (100 mg total) by mouth daily. 11/19/15  Yes [name], MD  tetrahydrozoline 0.05 % ophthalmic solution Place 1 drop into both eyes daily.   Yes Historical Provider, MD  thiamine 100 MG tablet Take 1 tablet (100 mg total) by mouth 2 (two) times daily. 07/30/15  Yes Vaughan Basta, MD  EPINEPHrine (EPIPEN 2-PAK) 0.3 mg/0.3 mL IJ SOAJ injection Inject 0.3 mg into the muscle once as needed (for severe allergic reaction).     Historical Provider, MD  Garnet Koyanagi 720-20 injection Inject 1 mL into the muscle See admin instructions. 3 doses : 2nd dose given 1 month from 1st dose and 3rd dose given 6 months from 1st dose 06/14/15   Historical Provider, MD      VITAL SIGNS:  Blood pressure (!) 119/59, pulse (!) 50, temperature 98.2 F (36.8 C), temperature source Oral, resp. rate 14, height 5\' 5"  (1.651 m), weight 77.1 kg (170 lb), SpO2 100 %.  PHYSICAL EXAMINATION:   Physical Exam  GENERAL:  65 y.o.-year-old patient lying in the bed with no acute distress.  EYES: Pupils equal, round, reactive to light and accommodation. No scleral icterus. Extraocular muscles intact.  HEENT: Head atraumatic, normocephalic. Oropharynx and nasopharynx clear.  NECK:  Supple, no jugular venous distention. No thyroid enlargement, no tenderness.  LUNGS: Normal breath sounds bilaterally, no wheezing, rales,rhonchi or  crepitation. No use of accessory muscles of respiration.  CARDIOVASCULAR: S1, S2 normal. No murmurs, rubs, or gallops.  ABDOMEN: Soft, nontender, Mild discomfort on mid abdominal palpation. nondistended. Bowel sounds present. No organomegaly or mass.  EXTREMITIES: No pedal edema, cyanosis, or clubbing.  NEUROLOGIC: Cranial nerves II through XII are intact. Muscle strength 5/5 in all extremities. Sensation intact. Gait not checked.  PSYCHIATRIC: The patient is alert and oriented x 3.  SKIN: No obvious rash, lesion, or ulcer.   LABORATORY PANEL:   CBC  Recent Labs Lab 11/23/15 1605  WBC 4.7  HGB 13.2  HCT 38.3*  PLT 123*   ------------------------------------------------------------------------------------------------------------------  Chemistries   Recent Labs Lab 11/23/15 1605  NA 137  K 3.9  CL 103  CO2 24  GLUCOSE 147*  BUN 53*  CREATININE 2.27*  CALCIUM 10.1  AST 59*  ALT 31  ALKPHOS 51  BILITOT 0.9    ------------------------------------------------------------------------------------------------------------------  Cardiac Enzymes No results for input(s): TROPONINI in the last 168 hours. ------------------------------------------------------------------------------------------------------------------  RADIOLOGY:  No results found.  EKG:   Orders placed or performed in visit on 07/23/15  . EKG 12-Lead  . EKG 12-Lead  . EKG 12-Lead    IMPRESSION AND PLAN:   Jahlani Newitt  is a 65 y.o. 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  #1 ARF on CKD- likely prerenal, gentle hydration - hold lasix and aldactone - baseline cr at 1.3, now at 2.3. Monitor - avoid nephrotoxins  #2 Hepatic encephalopathy- ammonia at 144, level from today is pending - continue lactulose, add xifaxan - f/u in AM -affecting only balance for now, mental status normal  #3 alcoholic liver cirrhosis-had a history of esophageal varices status post clipping and painting in June 2017. -Stable. Stopped drinking alcohol. -Continue Protonix. Also on nadolol which we will continue if the blood pressure allows.  #4 arthritis-following up with rheumatology.  #5 DVT prophylaxis-on Lovenox. check INR.    Physical therapy consult for weakness.   All the records are reviewed and case discussed with ED provider. Management plans discussed with the patient, family and they are in agreement.  CODE STATUS: Full code  TOTAL TIME TAKING CARE OF THIS PATIENT: 50 minutes.    Gladstone Lighter M.D on 11/23/2015 at 5:42 PM  Between 7am to 6pm - Pager - [phone]  After 6pm go to [email or web address] - password [email or web address]  Office  [phone]  CC: Primary care physician;  Arnette Norris, MD

## 2015-11-23 NOTE — ED Triage Notes (Signed)
Pt sent to er for eval of abnormal labs.  Pt states his doctor called and told him he was dehydrated.  Hx cirrhosis. Pt alert.  Speech clear.  Pt denies pain.

## 2015-11-23 NOTE — Progress Notes (Signed)
PHARMACIST - PHYSICIAN ORDER COMMUNICATION  CONCERNING: P&T Medication Policy on Herbal Medications  DESCRIPTION:  This patient's order for:  glucosamine-chondroitin 500-400 MG tablets has been noted.  This product(s) is classified as an "herbal" or natural product. Due to a lack of definitive safety studies or FDA approval, nonstandard manufacturing practices, plus the potential risk of unknown drug-drug interactions while on inpatient medications, the Pharmacy and Therapeutics Committee does not permit the use of "herbal" or natural products of this type within [facility].   ACTION TAKEN: The pharmacy department is unable to verify this order at this time and your patient has been informed of this safety policy. Please reevaluate patient's clinical condition at discharge and address if the herbal or natural product(s) should be resumed at that time.  Nancy Fetter, PharmD Clinical Pharmacist 11/23/2015 6:54 PM

## 2015-11-24 LAB — BASIC METABOLIC PANEL
ANION GAP: 7 (ref 5–15)
BUN: 48 mg/dL — ABNORMAL HIGH (ref 6–20)
CHLORIDE: 108 mmol/L (ref 101–111)
CO2: 22 mmol/L (ref 22–32)
Calcium: 9.4 mg/dL (ref 8.9–10.3)
Creatinine, Ser: 2.03 mg/dL — ABNORMAL HIGH (ref 0.61–1.24)
GFR calc Af Amer: 38 mL/min — ABNORMAL LOW (ref >60)
GFR, EST NON AFRICAN AMERICAN: 32 mL/min — AB (ref >60)
GLUCOSE: 110 mg/dL — AB (ref 65–99)
POTASSIUM: 3.7 mmol/L (ref 3.5–5.1)
SODIUM: 137 mmol/L (ref 135–145)

## 2015-11-24 LAB — CBC
HEMATOCRIT: 36.1 % — AB (ref 40.0–52.0)
HEMOGLOBIN: 12.1 g/dL — AB (ref 13.0–18.0)
MCH: 31.5 pg (ref 26.0–34.0)
MCHC: 33.5 g/dL (ref 32.0–36.0)
MCV: 94 fL (ref 80.0–100.0)
Platelets: 108 10*3/uL — ABNORMAL LOW (ref 150–440)
RBC: 3.84 MIL/uL — ABNORMAL LOW (ref 4.40–5.90)
RDW: 17.3 % — ABNORMAL HIGH (ref 11.5–14.5)
WBC: 4.6 10*3/uL (ref 3.8–10.6)

## 2015-11-24 LAB — AMMONIA: Ammonia: 177 umol/L — ABNORMAL HIGH (ref 9–35)

## 2015-11-24 NOTE — Discharge Summary (Signed)
[facility] NAME: Jeff Ward    MR#:  [id number]  DATE OF BIRTH:  [birth year]  DATE OF ADMISSION:  11/23/2015 ADMITTING PHYSICIAN: Gladstone Lighter, MD  DATE OF DISCHARGE: 11/24/2015  PRIMARY CARE PHYSICIAN: Arnette Norris, MD    ADMISSION DIAGNOSIS:  Acute renal failure, unspecified acute renal failure type ([place]) [N17.9]  DISCHARGE DIAGNOSIS:  Active Problems:   ARF (acute renal failure) ([place])   SECONDARY DIAGNOSIS:   Past Medical History:  Diagnosis Date  . Arthritis    In back and all joints  . Chronic headache    now resolved  . CKD (chronic kidney disease)   . 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 Depression   . Esophageal varices ([place])   . GERD (gastroesophageal reflux disease)   . GI bleed   . Hiatal hernia   . History of kidney stones 1970's  . Hyperlipidemia 06/2002  . Hypertension   . Psoriasis     HOSPITAL COURSE:    65 y.o. 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 Creatinine is not at baseline. Patient will need to continue to hold Lasix and Aldactone. Patient will need follow up on Monday for repeat creatinine.  2. alcoholic liver cirrhosis: Patient had elevated ammonia level on admission however exhibited no signs of encephalopathy. He was continued on lactulose. Patient had reported that he was having a lot of diarrhea prior to this.  Patient will continue on outpatient medications including nadolol.   3.  arthritis-following up with rheumatology.  DISCHARGE CONDITIONS AND DIET:   Stable for discharge on low sodium diet  CONSULTS OBTAINED:    DRUG ALLERGIES:   Allergies   Allergen Reactions  . Bee Venom Anaphylaxis  . Sulfa Antibiotics Other (See Comments)    Reaction:  Fainting     DISCHARGE MEDICATIONS:   Current Discharge Medication List    CONTINUE these medications which have NOT CHANGED   Details  fenofibrate (TRICOR) 145 MG tablet Take 145 mg by mouth every morning.     ferrous sulfate 325 (65 FE) MG tablet Take 1 tablet (325 mg total) by mouth 3 (three) times daily with meals. Qty: 90 tablet, Refills: 5    folic acid (FOLVITE) 1 MG tablet Take 1 tablet (1 mg total) by mouth daily. Qty: 90 tablet, Refills: 3    glucosamine-chondroitin 500-400 MG tablet Take 1 tablet by mouth 2 (two) times daily.      lactulose (CHRONULAC) 10 GM/15ML solution Take 30 mLs (20 g total) by mouth 3 (three) times daily. Qty: 1892 mL, Refills: 2    mometasone (ELOCON) 0.1 % lotion Place 4 drops into both ears 2 (two) times daily. For dry skin Refills: 0    Multiple Vitamin (MULTIVITAMIN WITH MINERALS) TABS tablet Take 1 tablet by mouth daily.    nadolol (CORGARD) 20 MG tablet Take 1 tablet (20 mg total) by mouth daily. Qty: 30 tablet, Refills: 2   Associated Diagnoses: Alcoholic cirrhosis of liver without ascites ([place]); Idiopathic esophageal varices with bleeding (H[facility])    pantoprazole (PROTONIX) 40 MG tablet Take 1 tablet (40 mg total) by mouth 2 (two) times daily. Qty: 180 tablet,  Refills: 2    tetrahydrozoline 0.05 % ophthalmic solution Place 1 drop into both eyes daily.    thiamine 100 MG tablet Take 1 tablet (100 mg total) by mouth 2 (two) times daily. Qty: 60 tablet, Refills: 0    EPINEPHrine (EPIPEN 2-PAK) 0.3 mg/0.3 mL IJ SOAJ injection Inject 0.3 mg into the muscle once as needed (for severe allergic reaction).    TWINRIX 720-20 injection Inject 1 mL into the muscle See admin instructions. 3 doses : 2nd dose given 1 month from 1st dose and 3rd dose given 6 months from 1st dose      STOP taking these medications     furosemide (LASIX) 20 MG  tablet      potassium chloride SA (K-DUR,KLOR-CON) 20 MEQ tablet      spironolactone (ALDACTONE) 50 MG tablet               Today   CHIEF COMPLAINT:  Patient doing well this morning. Patient is not confused.  VITAL SIGNS:  Blood pressure (!) 110/52, pulse (!) 54, temperature 98.4 F (36.9 C), temperature source Oral, resp. rate 18, height 5\' 5"  (1.651 m), weight 78.2 kg (172 lb 4.8 oz), SpO2 100 %.   REVIEW OF SYSTEMS:  Review of Systems  Constitutional: Negative.  Negative for chills, fever and malaise/fatigue.  HENT: Negative.  Negative for ear discharge, ear pain, hearing loss, nosebleeds and sore throat.   Eyes: Negative.  Negative for blurred vision and pain.  Respiratory: Negative.  Negative for cough, hemoptysis, shortness of breath and wheezing.   Cardiovascular: Negative.  Negative for chest pain, palpitations and leg swelling.  Gastrointestinal: Negative.  Negative for abdominal pain, blood in stool, diarrhea, nausea and vomiting.  Genitourinary: Negative.  Negative for dysuria.  Musculoskeletal: Negative.  Negative for back pain.  Skin: Negative.   Neurological: Negative for dizziness, tremors, speech change, focal weakness, seizures and headaches.  Endo/Heme/Allergies: Negative.  Does not bruise/bleed easily.  Psychiatric/Behavioral: Negative.  Negative for depression, hallucinations and suicidal ideas.     PHYSICAL EXAMINATION:  GENERAL:  65 y.o.-year-old patient lying in the bed with no acute distress.  NECK:  Supple, no jugular venous distention. No thyroid enlargement, no tenderness.  LUNGS: Normal breath sounds bilaterally, no wheezing, rales,rhonchi  No use of accessory muscles of respiration.  CARDIOVASCULAR: S1, S2 normal. No murmurs, rubs, or gallops.  ABDOMEN: Soft, non-tender, non-distended. Bowel sounds present. No organomegaly or mass.  EXTREMITIES: No pedal edema, cyanosis, or clubbing.  PSYCHIATRIC: The patient is alert and oriented x 3.   SKIN: No obvious rash, lesion, or ulcer.   DATA REVIEW:   CBC  Recent Labs Lab 11/24/15 0449  WBC 4.6  HGB 12.1*  HCT 36.1*  PLT 108*    Chemistries   Recent Labs Lab 11/23/15 1605 11/24/15 0449  NA 137 137  K 3.9 3.7  CL 103 108  CO2 24 22  GLUCOSE 147* 110*  BUN 53* 48*  CREATININE 2.27* 2.03*  CALCIUM 10.1 9.4  AST 59*  --   ALT 31  --   ALKPHOS 51  --   BILITOT 0.9  --     Cardiac Enzymes No results for input(s): TROPONINI in the last 168 hours.  Microbiology Results  @MICRORSLT48 @  RADIOLOGY:  No results found.    Management plans discussed with the patient and HE is in agreement. Stable for discharge HOME  Patient should follow up with PCP  CODE STATUS:     Code Status Orders  Start     Ordered   11/23/15 1847  Full code  Continuous     11/23/15 1846    Code Status History    Date Active Date Inactive Code Status Order ID Comments User Context   07/24/2015  1:48 AM 07/30/2015  7:33 PM Full Code [id number]  Quintella Baton, MD Inpatient      TOTAL TIME TAKING CARE OF THIS PATIENT: 37 minutes.    Note: This dictation was prepared with Dragon dictation along with smaller phrase technology. Any transcriptional errors that result from this process are unintentional.  Humna Moorehouse M.D on 11/24/2015 at 11:38 AM  Between 7am to 6pm - Pager - [phone] After 6pm go to [email or web address] - password [email or web address] [facility]  Office  [phone]  CC: Primary care physician; Arnette Norris, MD

## 2015-11-24 NOTE — Progress Notes (Signed)
Pt to be discharged per MD order. IV removed. Instructions reviewed with pt and family, all questions answered. Will be taken out in wheelchair.

## 2015-11-24 NOTE — Evaluation (Signed)
Physical Therapy Evaluation Patient Details Name: RYMAN STFLEUR MRN: [id number] DOB: 18-May-1950 Today's Date: 11/24/2015   History of Present Illness  Patient is a 65 y/o male that presents to [facility] after elevated creatinine levels in OP labs. He has a history of dizziness treated by OP PT at [facility], and was recently discharged.   Clinical Impression  Patient is a pleasant 65 y/o male that presents with altered lab values. He has a history of dizziness, though was recently discharged from vestibular PT with reported decline in symptoms. He has a history of alcohol use which may contribute to those symptoms. Today he appears back at baseline with all mobility, no deficits identified. PT will sign off as patient is independent with all mobility/transfers.     Follow Up Recommendations No PT follow up    Equipment Recommendations       Recommendations for Other Services       Precautions / Restrictions Precautions Precautions: None Restrictions Weight Bearing Restrictions: No      Mobility  Bed Mobility Overal bed mobility: Independent             General bed mobility comments: No deficits noted   Transfers Overall transfer level: Independent               General transfer comment: No deficits noted, no dizziness reported.   Ambulation/Gait Ambulation/Gait assistance: Independent Ambulation Distance (Feet): 360 Feet Assistive device: None Gait Pattern/deviations: WFL(Within Functional Limits)   Gait velocity interpretation: at or above normal speed for age/gender General Gait Details: No deficits with turning, gait pattern. No loss of balance noted.   Stairs            Wheelchair Mobility    Modified Rankin (Stroke Patients Only)       Balance Overall balance assessment: Independent                                           Pertinent Vitals/Pain Pain Assessment: No/denies pain    Home Living Family/patient expects to be  discharged to:: Private residence Living Arrangements: Spouse/significant other Available Help at Discharge: Family Type of Home: House Home Access: Stairs to enter   Technical brewer of Steps: 7 Home Layout: One level Home Equipment: None      Prior Function Level of Independence: Independent               Hand Dominance        Extremity/Trunk Assessment   Upper Extremity Assessment: Overall WFL for tasks assessed           Lower Extremity Assessment: Overall WFL for tasks assessed         Communication   Communication: No difficulties  Cognition Arousal/Alertness: Awake/alert Behavior During Therapy: WFL for tasks assessed/performed Overall Cognitive Status: Within Functional Limits for tasks assessed                      General Comments      Exercises     Assessment/Plan    PT Assessment Patent does not need any further PT services  PT Problem List            PT Treatment Interventions      PT Goals (Current goals can be found in the Care Plan section)       Frequency     Barriers to  discharge        Co-evaluation               End of Session Equipment Utilized During Treatment: Gait belt Activity Tolerance: Patient tolerated treatment well Patient left: in bed;with call bell/phone within reach Nurse Communication: Mobility status    Functional Assessment Tool Used: Clinical judgement  Functional Limitation: Mobility: Walking and moving around Mobility: Walking and Moving Around Current Status [id number]): 0 percent impaired, limited or restricted Mobility: Walking and Moving Around Goal Status [id number]): 0 percent impaired, limited or restricted    Time: 0900-0910 PT Time Calculation (min) (ACUTE ONLY): 10 min   Charges:   PT Evaluation $PT Eval Low Complexity: 1 Procedure     PT G Codes:   PT G-Codes **NOT FOR INPATIENT CLASS** Functional Assessment Tool Used: Clinical judgement  Functional Limitation:  Mobility: Walking and moving around Mobility: Walking and Moving Around Current Status [id number]): 0 percent impaired, limited or restricted Mobility: Walking and Moving Around Goal Status [id number]): 0 percent impaired, limited or restricted   Kerman Passey, PT, DPT    11/24/2015, 11:54 AM

## 2015-11-27 ENCOUNTER — Encounter: Payer: Self-pay | Admitting: Family Medicine

## 2015-11-27 ENCOUNTER — Ambulatory Visit (INDEPENDENT_AMBULATORY_CARE_PROVIDER_SITE_OTHER): Payer: Medicare Other | Admitting: Family Medicine

## 2015-11-27 VITALS — BP 110/56 | HR 54 | Temp 97.9°F | Wt 176.5 lb

## 2015-11-27 DIAGNOSIS — N179 Acute kidney failure, unspecified: Secondary | ICD-10-CM | POA: Diagnosis not present

## 2015-11-27 DIAGNOSIS — K7469 Other cirrhosis of liver: Secondary | ICD-10-CM

## 2015-11-27 LAB — COMPREHENSIVE METABOLIC PANEL
ALBUMIN: 3.8 g/dL (ref 3.5–5.2)
ALT: 27 U/L (ref 0–53)
AST: 46 U/L — AB (ref 0–37)
Alkaline Phosphatase: 48 U/L (ref 39–117)
BUN: 35 mg/dL — AB (ref 6–23)
CHLORIDE: 107 meq/L (ref 96–112)
CO2: 24 mEq/L (ref 19–32)
CREATININE: 1.37 mg/dL (ref 0.40–1.50)
Calcium: 9.8 mg/dL (ref 8.4–10.5)
GFR: 55.2 mL/min — ABNORMAL LOW (ref >60.00)
GLUCOSE: 73 mg/dL (ref 70–99)
Potassium: 4 mEq/L (ref 3.5–5.1)
SODIUM: 138 meq/L (ref 135–145)
TOTAL PROTEIN: 7.3 g/dL (ref 6.0–8.3)
Total Bilirubin: 0.6 mg/dL (ref 0.2–1.2)

## 2015-11-27 MED ORDER — FUROSEMIDE 20 MG PO TABS: 20.0000 mg | ORAL_TABLET | Freq: Every day | ORAL | 3 refills | Status: DC

## 2015-11-27 MED ORDER — SPIRONOLACTONE 50 MG PO TABS: 100.0000 mg | ORAL_TABLET | Freq: Every day | ORAL | 2 refills | Status: DC

## 2015-11-27 NOTE — Assessment & Plan Note (Addendum)
discussed his medications and what he should and should not be taking. Check cr now.  If improved, restart lasix 20 mg daily and aldactone. The patient indicates understanding of these issues and agrees with the plan.

## 2015-11-27 NOTE — Patient Instructions (Signed)
Please wait to restart your lasix and aldactone until after you're heard from me.

## 2015-11-27 NOTE — Progress Notes (Signed)
Subjective:   Patient ID: Jeff Ward, male    DOB: May 15, 1950, 65 y.o.   MRN: [id number]  Jeff Ward is a pleasant 65 y.o. year old male who presents to clinic today with Hospitalization Follow-up; Medication Problem (wanting to discuss all meds); and Breast Mass (left)  on 11/27/2015  HPI:  Admitted to [facility] 11/23/15- 11/24/15 after I advised pt to go to ER due to elevated Creatinine (likely pre renal).  Notes reviewed.  Given IVFs and advised to hold lasix and aldactone.  He feels ok today, tired.  He thinks he knows what caused his renal failure.  He ran out of lasix 20 mg daily so took his wife's lasix which is 80 mg.  Lab Results  Component Value Date   CREATININE 2.03 (H) 11/24/2015   Wt Readings from Last 3 Encounters:  11/27/15 176 lb 8 oz (80.1 kg)  11/23/15 172 lb 4.8 oz (78.2 kg)  11/07/15 174 lb (78.9 kg)   Current Outpatient Prescriptions on File Prior to Visit  Medication Sig Dispense Refill  . EPINEPHrine (EPIPEN 2-PAK) 0.3 mg/0.3 mL IJ SOAJ injection Inject 0.3 mg into the muscle once as needed (for severe allergic reaction).    . fenofibrate (TRICOR) 145 MG tablet Take 145 mg by mouth every morning.     . ferrous sulfate 325 (65 FE) MG tablet Take 1 tablet (325 mg total) by mouth 3 (three) times daily with meals. 90 tablet 5  . folic acid (FOLVITE) 1 MG tablet Take 1 tablet (1 mg total) by mouth daily. 90 tablet 3  . glucosamine-chondroitin 500-400 MG tablet Take 1 tablet by mouth 2 (two) times daily.      Marland Kitchen lactulose (CHRONULAC) 10 GM/15ML solution Take 30 mLs (20 g total) by mouth 3 (three) times daily. 1892 mL 2  . mometasone (ELOCON) 0.1 % lotion Place 4 drops into both ears 2 (two) times daily. For dry skin  0  . Multiple Vitamin (MULTIVITAMIN WITH MINERALS) TABS tablet Take 1 tablet by mouth daily.    . nadolol (CORGARD) 20 MG tablet Take 1 tablet (20 mg total) by mouth daily. 30 tablet 2  . pantoprazole (PROTONIX) 40 MG tablet Take 1 tablet (40 mg  total) by mouth 2 (two) times daily. 180 tablet 2  . tetrahydrozoline 0.05 % ophthalmic solution Place 1 drop into both eyes daily.    Marland Kitchen thiamine 100 MG tablet Take 1 tablet (100 mg total) by mouth 2 (two) times daily. 60 tablet 0  . TWINRIX 720-20 injection Inject 1 mL into the muscle See admin instructions. 3 doses : 2nd dose given 1 month from 1st dose and 3rd dose given 6 months from 1st dose     No current facility-administered medications on file prior to visit.     Allergies  Allergen Reactions  . Bee Venom Anaphylaxis  . Sulfa Antibiotics Other (See Comments)    Reaction:  Fainting     Past Medical History:  Diagnosis Date  . Arthritis    In back and all joints  . Chronic headache    now resolved  . CKD (chronic kidney disease)   . 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 Depression   . Esophageal varices ([place])   . GERD (gastroesophageal reflux disease)   . GI bleed   . Hiatal hernia   . History of kidney stones 1970's  . Hyperlipidemia 06/2002  . Hypertension   . Psoriasis  Past Surgical History:  Procedure Laterality Date  . CARPAL TUNNEL RELEASE  2005    / right hand  . ESOPHAGOGASTRODUODENOSCOPY N/A 07/24/2015   Procedure: ESOPHAGOGASTRODUODENOSCOPY (EGD);  Surgeon: Lucilla Lame, MD;  Location: [facility] ENDOSCOPY;  Service: Endoscopy;  Laterality: N/A;  Banding  . ESOPHAGOGASTRODUODENOSCOPY (EGD) WITH PROPOFOL N/A 08/14/2015   Procedure: ESOPHAGOGASTRODUODENOSCOPY (EGD) WITH PROPOFOL;  Surgeon: Ladene Artist, MD;  Location: [facility] ENDOSCOPY;  Service: Endoscopy;  Laterality: N/A;  . TONSILLECTOMY AND ADENOIDECTOMY     65 years old  . VASECTOMY      Family History  Problem Relation Age of Onset  . CAD Mother   . Melanoma Father   . CVA Father   . Breast cancer Paternal Aunt   . Alcohol abuse Maternal Grandfather     Social History   Social History  . Marital status: Married    Spouse name: N/A  . Number of children: 2  . Years of  education: N/A   Occupational History  . [employer]-Maintence     currently unemployed  .  [place]   Social History Main Topics  . Smoking status: Former Smoker    Years: 35.00    Types: Cigars    Quit date: 04/02/2010  . Smokeless tobacco: Never Used     Comment: Quit 4 years ago  . 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 Drug use: No  . Sexual activity: Yes    Partners: Female     Comment: declined condoms   Other Topics Concern  . Not on file   Social History Narrative   Independent at baseline. Has a cane that he uses occasionally. Lives at home with his wife.   The PMH, PSH, Social History, Family History, Medications, and allergies have been reviewed in [facility], and have been updated if relevant.   Review of Systems  Constitutional: Positive for fatigue.  HENT: Negative.   Respiratory: Negative.   Cardiovascular: Negative.   Gastrointestinal: Negative.   Endocrine: Negative.   Genitourinary: Negative.   Musculoskeletal: Negative.   Allergic/Immunologic: Negative.   Neurological: Negative.   Hematological: Negative.   Psychiatric/Behavioral: Negative.   All other systems reviewed and are negative.      Objective:    BP (!) 110/56   Pulse (!) 54   Temp 97.9 F (36.6 C) (Oral)   Wt 176 lb 8 oz (80.1 kg)   SpO2 99%   BMI 29.37 kg/m    Physical Exam  Constitutional: He is oriented to person, place, and time. He appears well-developed and well-nourished. No distress.  HENT:  Head: Normocephalic.  Eyes: Conjunctivae are normal.  Cardiovascular: Normal rate.   Pulmonary/Chest: Effort normal.  Abdominal: Soft. He exhibits no distension. There is no tenderness.  Musculoskeletal: Normal range of motion.  Neurological: He is alert and oriented to person, place, and time. No cranial nerve deficit.  Skin: Skin is warm and dry. He is not diaphoretic.  Psychiatric: He has a normal mood and affect. His  behavior is normal. Judgment and thought content normal.  Nursing note and vitals reviewed.         Assessment & Plan:   Other cirrhosis of liver (HCC)  Acute renal failure, unspecified acute renal failure type ([place]) - Plan: Comprehensive metabolic panel No Follow-up on file.

## 2015-11-27 NOTE — Progress Notes (Signed)
Pre visit review using our clinic review tool, if applicable. No additional management support is needed unless otherwise documented below in the visit note. 

## 2015-11-28 ENCOUNTER — Encounter: Payer: Self-pay | Admitting: Family Medicine

## 2015-11-28 LAB — AMMONIA: AMMONIA: 139 umol/L — AB (ref <=47)

## 2015-11-29 ENCOUNTER — Encounter: Payer: Self-pay | Admitting: Family Medicine

## 2015-11-29 ENCOUNTER — Other Ambulatory Visit: Payer: Self-pay | Admitting: Family Medicine

## 2015-11-29 DIAGNOSIS — F32A Depression, unspecified: Secondary | ICD-10-CM

## 2015-11-29 DIAGNOSIS — F329 Major depressive disorder, single episode, unspecified: Secondary | ICD-10-CM

## 2015-11-30 MED ORDER — FUROSEMIDE 20 MG PO TABS
20.0000 mg | ORAL_TABLET | Freq: Every day | ORAL | 2 refills | Status: DC
Start: 2015-11-30 — End: 2020-03-01

## 2015-12-01 ENCOUNTER — Encounter: Payer: Self-pay | Admitting: Family Medicine

## 2015-12-03 MED ORDER — FOLIC ACID 1 MG PO TABS: 1.0000 mg | ORAL_TABLET | Freq: Every day | ORAL | 3 refills | Status: DC

## 2015-12-04 ENCOUNTER — Other Ambulatory Visit: Payer: Self-pay | Admitting: Gastroenterology

## 2015-12-04 DIAGNOSIS — K7030 Alcoholic cirrhosis of liver without ascites: Secondary | ICD-10-CM

## 2015-12-04 DIAGNOSIS — I8501 Esophageal varices with bleeding: Secondary | ICD-10-CM

## 2015-12-05 ENCOUNTER — Other Ambulatory Visit: Payer: Self-pay | Admitting: Family Medicine

## 2015-12-06 ENCOUNTER — Encounter: Payer: Self-pay | Admitting: Family Medicine

## 2015-12-06 ENCOUNTER — Telehealth: Payer: Self-pay | Admitting: Family Medicine

## 2015-12-06 NOTE — Telephone Encounter (Signed)
Called patient to schedule the Counseling appt and patient says he is feeling great on the medicine you sent in and asked me to cancel the Referral. Patient will call me back if he decides he wants to go. I will cancel Referral.

## 2015-12-10 NOTE — Telephone Encounter (Signed)
Lm on pts vm requesting a call back 

## 2015-12-11 ENCOUNTER — Other Ambulatory Visit: Payer: Self-pay | Admitting: Gastroenterology

## 2015-12-11 DIAGNOSIS — I8501 Esophageal varices with bleeding: Secondary | ICD-10-CM

## 2015-12-11 DIAGNOSIS — K7030 Alcoholic cirrhosis of liver without ascites: Secondary | ICD-10-CM

## 2015-12-11 NOTE — Telephone Encounter (Signed)
Lm on pts vm requesting a call back 

## 2015-12-13 ENCOUNTER — Encounter: Payer: Self-pay | Admitting: Family Medicine

## 2015-12-14 ENCOUNTER — Other Ambulatory Visit: Payer: Self-pay | Admitting: *Deleted

## 2015-12-14 MED ORDER — FENOFIBRATE 145 MG PO TABS: 145.0000 mg | ORAL_TABLET | ORAL | 1 refills | Status: DC

## 2015-12-17 ENCOUNTER — Encounter: Payer: Self-pay | Admitting: Family Medicine

## 2015-12-26 ENCOUNTER — Telehealth: Payer: Self-pay | Admitting: Family Medicine

## 2015-12-26 DIAGNOSIS — F329 Major depressive disorder, single episode, unspecified: Secondary | ICD-10-CM

## 2015-12-26 DIAGNOSIS — F32A Depression, unspecified: Secondary | ICD-10-CM

## 2015-12-26 NOTE — Telephone Encounter (Signed)
Referral placed.

## 2015-12-26 NOTE — Telephone Encounter (Signed)
Patient called to ask you that he now does want to see a Psychologist here at [facility]. I told Jeff Ward that he cancelled the last Referral and said he wouldn't go but now he wants to go. Please put a New Psychology Referral in and when Bambi Cottle has a New patient opening we can get him an appt with her.

## 2016-01-07 ENCOUNTER — Encounter: Payer: Self-pay | Admitting: Family Medicine

## 2016-01-16 ENCOUNTER — Telehealth: Payer: Self-pay | Admitting: Gastroenterology

## 2016-01-16 DIAGNOSIS — I8501 Esophageal varices with bleeding: Secondary | ICD-10-CM

## 2016-01-16 DIAGNOSIS — K7030 Alcoholic cirrhosis of liver without ascites: Secondary | ICD-10-CM

## 2016-01-17 MED ORDER — NADOLOL 20 MG PO TABS: 20.0000 mg | ORAL_TABLET | Freq: Every day | ORAL | 0 refills | Status: DC

## 2016-01-17 NOTE — Telephone Encounter (Signed)
Prescription for nadolol sent to [facility] for 90 day supply. Called patient to inform him that we sent prescription and that he is due for a follow up visit in February but phone rang with no answer and voicemail to leave a message.

## 2016-01-17 NOTE — Telephone Encounter (Signed)
Informed patient that refill has been sent in and appointment has been scheduled.

## 2016-01-18 ENCOUNTER — Other Ambulatory Visit: Payer: Self-pay | Admitting: Family Medicine

## 2016-01-18 DIAGNOSIS — Z0000 Encounter for general adult medical examination without abnormal findings: Secondary | ICD-10-CM | POA: Insufficient documentation

## 2016-01-18 DIAGNOSIS — E785 Hyperlipidemia, unspecified: Secondary | ICD-10-CM

## 2016-01-24 ENCOUNTER — Ambulatory Visit (INDEPENDENT_AMBULATORY_CARE_PROVIDER_SITE_OTHER): Payer: Medicare Other

## 2016-01-24 VITALS — BP 112/60 | HR 54 | Temp 98.3°F | Ht 65.0 in | Wt 178.8 lb

## 2016-01-24 DIAGNOSIS — Z0000 Encounter for general adult medical examination without abnormal findings: Secondary | ICD-10-CM | POA: Diagnosis not present

## 2016-01-24 LAB — COMPREHENSIVE METABOLIC PANEL
ALT: 21 U/L (ref 0–53)
AST: 34 U/L (ref 0–37)
Albumin: 4.1 g/dL (ref 3.5–5.2)
Alkaline Phosphatase: 57 U/L (ref 39–117)
BUN: 23 mg/dL (ref 6–23)
CHLORIDE: 108 meq/L (ref 96–112)
CO2: 25 mEq/L (ref 19–32)
CREATININE: 1.48 mg/dL (ref 0.40–1.50)
Calcium: 10.1 mg/dL (ref 8.4–10.5)
GFR: 50.47 mL/min — ABNORMAL LOW (ref >60.00)
GLUCOSE: 119 mg/dL — AB (ref 70–99)
POTASSIUM: 5.4 meq/L — AB (ref 3.5–5.1)
SODIUM: 138 meq/L (ref 135–145)
Total Bilirubin: 0.6 mg/dL (ref 0.2–1.2)
Total Protein: 7.4 g/dL (ref 6.0–8.3)

## 2016-01-24 LAB — CBC WITH DIFFERENTIAL/PLATELET
BASOS ABS: 0 10*3/uL (ref 0.0–0.1)
Basophils Relative: 0.6 % (ref 0.0–3.0)
EOS ABS: 0.3 10*3/uL (ref 0.0–0.7)
EOS PCT: 5.8 % — AB (ref 0.0–5.0)
HCT: 38.3 % — ABNORMAL LOW (ref 39.0–52.0)
Hemoglobin: 13.3 g/dL (ref 13.0–17.0)
LYMPHS ABS: 1.4 10*3/uL (ref 0.7–4.0)
Lymphocytes Relative: 26.9 % (ref 12.0–46.0)
MCHC: 34.8 g/dL (ref 30.0–36.0)
MCV: 95.7 fl (ref 78.0–100.0)
MONO ABS: 0.6 10*3/uL (ref 0.1–1.0)
Monocytes Relative: 11.8 % (ref 3.0–12.0)
NEUTROS PCT: 54.9 % (ref 43.0–77.0)
Neutro Abs: 2.9 10*3/uL (ref 1.4–7.7)
Platelets: 127 10*3/uL — ABNORMAL LOW (ref 150.0–400.0)
RBC: 4 Mil/uL — AB (ref 4.22–5.81)
RDW: 15.5 % (ref 11.5–15.5)
WBC: 5.3 10*3/uL (ref 4.0–10.5)

## 2016-01-24 LAB — LIPID PANEL
CHOL/HDL RATIO: 5
Cholesterol: 119 mg/dL (ref 0–200)
HDL: 24.5 mg/dL — ABNORMAL LOW (ref >39.00)
LDL CALC: 72 mg/dL (ref 0–99)
NONHDL: 94.3
Triglycerides: 111 mg/dL (ref 0.0–149.0)
VLDL: 22.2 mg/dL (ref 0.0–40.0)

## 2016-01-24 LAB — PSA: PSA: 0.15 ng/mL (ref 0.10–4.00)

## 2016-01-24 NOTE — Progress Notes (Signed)
Subjective:   Jeff Ward is a 65 y.o. male who presents for an Initial Medicare Annual Wellness Visit.  Review of Systems  N/A Cardiac Risk Factors include: advanced age (>18men, >3 women);male gender;hypertension;dyslipidemia    Objective:    Today's Vitals   01/24/16 1142 01/24/16 1145  BP: 112/60   Pulse: (!) 54   Temp: 98.3 F (36.8 C)   TempSrc: Oral   SpO2: 98%   Weight: 178 lb 12 oz (81.1 kg)   Height: 5\' 5"  (1.651 m)   PainSc: 7  7   PainLoc: Generalized    Body mass index is 29.75 kg/m.  Current Medications (verified) Outpatient Encounter Prescriptions as of 01/24/2016  Medication Sig  . EPINEPHrine (EPIPEN 2-PAK) 0.3 mg/0.3 mL IJ SOAJ injection Inject 0.3 mg into the muscle once as needed (for severe allergic reaction).  . fenofibrate (TRICOR) 145 MG tablet Take 1 tablet (145 mg total) by mouth every morning.  . ferrous sulfate 325 (65 FE) MG tablet Take 1 tablet (325 mg total) by mouth 3 (three) times daily with meals.  . folic acid (FOLVITE) 1 MG tablet Take 1 tablet (1 mg total) by mouth daily.  . furosemide (LASIX) 20 MG tablet Take 1 tablet (20 mg total) by mouth daily.  Marland Kitchen gabapentin (NEURONTIN) 300 MG capsule Take 300 mg by mouth 2 (two) times daily.  Marland Kitchen glucosamine-chondroitin 500-400 MG tablet Take 1 tablet by mouth 2 (two) times daily.    Marland Kitchen lactulose (CHRONULAC) 10 GM/15ML solution Take 30 mLs (20 g total) by mouth 3 (three) times daily.  . mometasone (ELOCON) 0.1 % lotion Place 4 drops into both ears 2 (two) times daily. For dry skin  . Multiple Vitamin (MULTIVITAMIN WITH MINERALS) TABS tablet Take 1 tablet by mouth daily.  . nadolol (CORGARD) 20 MG tablet Take 1 tablet (20 mg total) by mouth daily.  . pantoprazole (PROTONIX) 40 MG tablet Take 1 tablet (40 mg total) by mouth 2 (two) times daily.  . potassium chloride SA (K-DUR,KLOR-CON) 20 MEQ tablet TAKE 1 TABLET BY MOUTH TWO  TIMES DAILY  . spironolactone (ALDACTONE) 50 MG tablet Take 2 tablets  (100 mg total) by mouth daily.  Marland Kitchen tetrahydrozoline 0.05 % ophthalmic solution Place 1 drop into both eyes daily.  Marland Kitchen thiamine 100 MG tablet Take 1 tablet (100 mg total) by mouth 2 (two) times daily.  Marland Kitchen TWINRIX 720-20 injection Inject 1 mL into the muscle See admin instructions. 3 doses : 2nd dose given 1 month from 1st dose and 3rd dose given 6 months from 1st dose   No facility-administered encounter medications on file as of 01/24/2016.     Allergies (verified) Bee venom and Sulfa antibiotics   History: Past Medical History:  Diagnosis Date  . Arthritis    In back and all joints  . Chronic headache    now resolved  . CKD (chronic kidney disease)   . 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 Depression   . Esophageal varices ([place])   . GERD (gastroesophageal reflux disease)   . GI bleed   . Hiatal hernia   . History of kidney stones 1970's  . Hyperlipidemia 06/2002  . Hypertension   . Psoriasis    Past Surgical History:  Procedure Laterality Date  . CARPAL TUNNEL RELEASE  2005    / right hand  . ESOPHAGOGASTRODUODENOSCOPY N/A 07/24/2015   Procedure: ESOPHAGOGASTRODUODENOSCOPY (EGD);  Surgeon: Lucilla Lame, MD;  Location: [facility] ENDOSCOPY;  Service:  Endoscopy;  Laterality: N/A;  Banding  . ESOPHAGOGASTRODUODENOSCOPY (EGD) WITH PROPOFOL N/A 08/14/2015   Procedure: ESOPHAGOGASTRODUODENOSCOPY (EGD) WITH PROPOFOL;  Surgeon: Ladene Artist, MD;  Location: [facility] ENDOSCOPY;  Service: Endoscopy;  Laterality: N/A;  . TONSILLECTOMY AND ADENOIDECTOMY     65 years old  . VASECTOMY     Family History  Problem Relation Age of Onset  . CAD Mother   . Melanoma Father   . CVA Father   . Breast cancer Paternal Aunt   . Alcohol abuse Maternal Grandfather    Social History   Occupational History  . [employer]-Maintence     currently unemployed  .  [place]   Social History Main Topics  . Smoking status: Former Smoker    Years: 35.00    Types: Cigars      Quit date: 04/02/2010  . Smokeless tobacco: Never Used     Comment: Quit 4 years ago  . 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 Drug use: No  . Sexual activity: Yes    Partners: Female     Comment: declined condoms   Tobacco Counseling Counseling given: No   Activities of Daily Living In your present state of health, do you have any difficulty performing the following activities: 01/24/2016 11/23/2015  Hearing? N N  Vision? N N  Difficulty concentrating or making decisions? N N  Walking or climbing stairs? N N  Dressing or bathing? N N  Doing errands, shopping? N N  Preparing Food and eating ? N -  Using the Toilet? N -  In the past six months, have you accidently leaked urine? N -  Do you have problems with loss of bowel control? N -  Managing your Medications? N -  Managing your Finances? N -  Housekeeping or managing your Housekeeping? N -  Some recent data might be hidden    Immunizations and Health Maintenance Immunization History  Administered Date(s) Administered  . Hep A / Hep B 06/14/2015, 06/21/2015, 07/05/2015  . Influenza Split 11/20/2011  . Influenza,inj,Quad PF,36+ Mos 10/09/2015  . Influenza-Unspecified 11/05/2013, 10/26/2014  . Td 11/15/1996  . Tdap 11/21/2015  . Zoster 04/27/2013   There are no preventive care reminders to display for this patient.  Patient Care Team: [name], MD as PCP - General  Indicate any recent Medical Services you may have received from other than [facility] providers in the past year (date may be approximate).    Assessment:   This is a routine wellness examination for Jeff Ward.  Hearing/Vision screen  Visual Acuity Screening   Right eye Left eye Both eyes  Without correction:     With correction: 20/25-1 20/50-1 20/30  Hearing Screening Comments: Hearing test in 2017 at [facility]; no hearing loss  Dietary issues and exercise activities discussed: Current Exercise Habits: The patient  does not participate in regular exercise at present, Exercise limited by: None identified  Goals    . Increase physical activity          Starting 01/28/2016, I plan to join gym. My goal is to work out for at least 60 min 3 days per week.       Depression Screen PHQ 2/9 Scores 01/24/2016 08/15/2010  PHQ - 2 Score 5 0  PHQ- 9 Score 15 -    Fall Risk Fall Risk  01/24/2016  Falls in the past year? No  Cognitive Function: MMSE - Mini Mental State Exam 01/24/2016  Orientation to time 5  Orientation to Place 5  Registration 3  Attention/ Calculation 0  Recall 3  Language- name 2 objects 0  Language- repeat 1  Language- follow 3 step command 3  Language- read & follow direction 0  Write a sentence 0  Copy design 0  Total score 20     PLEASE NOTE: A Mini-Cog screen was completed. Maximum score is 20. A value of 0 denotes this part of Folstein MMSE was not completed or the patient failed this part of the Mini-Cog screening.   Mini-Cog Screening Orientation to Time - Max 5 pts Orientation to Place - Max 5 pts Registration - Max 3 pts Recall - Max 3 pts Language Repeat - Max 1 pts Language Follow 3 Step Command - Max 3 pts     Screening Tests Health Maintenance  Topic Date Due  . PNA vac Low Risk Adult (1 of 2 - PCV13) 04/26/2016 (Originally 08/11/2014)  . COLONOSCOPY  07/20/2018  . TETANUS/TDAP  11/20/2025  . INFLUENZA VACCINE  Completed  . ZOSTAVAX  Completed  . Hepatitis C Screening  Completed        Plan:     I have personally reviewed and addressed the Medicare Annual Wellness questionnaire and have noted the following in the patient's chart:  A. Medical and social history B. Use of alcohol, tobacco or illicit drugs  C. Current medications and supplements D. Functional ability and status E.  Nutritional status F.  Physical activity G. Advance directives H. List of other physicians I.  Hospitalizations, surgeries, and ER visits in previous 12 months J.    [place] to include hearing, vision, cognitive, depression L. Referrals and appointments - none  In addition, I have reviewed and discussed with patient certain preventive protocols, quality metrics, and best practice recommendations. A written personalized care plan for preventive services as well as general preventive health recommendations were provided to patient.  See attached scanned questionnaire for additional information.   Signed,   Lindell Noe, MHA, BS, LPN Health Coach

## 2016-01-24 NOTE — Patient Instructions (Signed)
[name] , Thank you for taking time to come for your Medicare Wellness Visit. I appreciate your ongoing commitment to your health goals. Please review the following plan we discussed and let me know if I can assist you in the future.   These are the goals we discussed: Goals    . Increase physical activity          Starting 01/28/2016, I plan to join gym. My goal is to work out for at least 60 min 3 days per week.        This is a list of the screening recommended for you and due dates:  Health Maintenance  Topic Date Due  . Pneumonia vaccines (1 of 2 - PCV13) 04/26/2016*  . Colon Cancer Screening  07/20/2018  . Tetanus Vaccine  11/20/2025  . Flu Shot  Completed  . Shingles Vaccine  Completed  .  Hepatitis C: One time screening is recommended by Center for Disease Control  (CDC) for  adults born from [birth year] through 1965.   Completed  *Topic was postponed. The date shown is not the original due date.   Preventive Care for Adults  A healthy lifestyle and preventive care can promote health and wellness. Preventive health guidelines for adults include the following key practices.  . A routine yearly physical is a good way to check with your health care provider about your health and preventive screening. It is a chance to share any concerns and updates on your health and to receive a thorough exam.  . Visit your dentist for a routine exam and preventive care every 6 months. Brush your teeth twice a day and floss once a day. Good oral hygiene prevents tooth decay and gum disease.  . The frequency of eye exams is based on your age, health, family medical history, use  of contact lenses, and other factors. Follow your health care provider's ecommendations for frequency of eye exams.  . Eat a healthy diet. Foods like vegetables, fruits, whole grains, low-fat dairy products, and lean protein foods contain the nutrients you need without too many calories. Decrease your intake of foods high in  solid fats, added sugars, and salt. Eat the right amount of calories for you. Get information about a proper diet from your health care provider, if necessary.  . Regular physical exercise is one of the most important things you can do for your health. Most adults should get at least 150 minutes of moderate-intensity exercise (any activity that increases your heart rate and causes you to sweat) each week. In addition, most adults need muscle-strengthening exercises on 2 or more days a week.  Silver Sneakers may be a benefit available to you. To determine eligibility, you may visit the website: www.silversneakers.com or contact program at [phone] Mon-Fri between 8AM-8PM.   . Maintain a healthy weight. The body mass index (BMI) is a screening tool to identify possible weight problems. It provides an estimate of body fat based on height and weight. Your health care provider can find your BMI and can help you achieve or maintain a healthy weight.   For adults 20 years and older: ? A BMI below 18.5 is considered underweight. ? A BMI of 18.5 to 24.9 is normal. ? A BMI of 25 to 29.9 is considered overweight. ? A BMI of 30 and above is considered obese.   . Maintain normal blood lipids and cholesterol levels by exercising and minimizing your intake of saturated fat. Eat a balanced diet with  plenty of fruit and vegetables. Blood tests for lipids and cholesterol should begin at age 34 and be repeated every 5 years. If your lipid or cholesterol levels are high, you are over 50, or you are at high risk for heart disease, you may need your cholesterol levels checked more frequently. Ongoing high lipid and cholesterol levels should be treated with medicines if diet and exercise are not working.  . If you smoke, find out from your health care provider how to quit. If you do not use tobacco, please do not start.  . If you choose to drink alcohol, please do not consume more than 2 drinks per day. One drink  is considered to be 12 ounces (355 mL) of beer, 5 ounces (148 mL) of wine, or 1.5 ounces (44 mL) of liquor.  . If you are 30-40 years old, ask your health care provider if you should take aspirin to prevent strokes.  . Use sunscreen. Apply sunscreen liberally and repeatedly throughout the day. You should seek shade when your shadow is shorter than you. Protect yourself by wearing long sleeves, pants, a wide-brimmed hat, and sunglasses year round, whenever you are outdoors.  . Once a month, do a whole body skin exam, using a mirror to look at the skin on your back. Tell your health care provider of new moles, moles that have irregular borders, moles that are larger than a pencil eraser, or moles that have changed in shape or color.

## 2016-01-24 NOTE — Progress Notes (Signed)
PCP notes:   Health maintenance:  PCV13 - pt will discuss PNA vaccine with PCP at CPE  Abnormal screenings:   Depression score: 15  Patient concerns:   None  Nurse concerns:  None  Next PCP appt:   01/31/16 @ 0830

## 2016-01-24 NOTE — Progress Notes (Signed)
Pre visit review using our clinic review tool, if applicable. No additional management support is needed unless otherwise documented below in the visit note. 

## 2016-01-25 NOTE — Progress Notes (Signed)
I reviewed health advisor's note, was available for consultation, and agree with documentation and plan.  

## 2016-01-30 DIAGNOSIS — H35033 Hypertensive retinopathy, bilateral: Secondary | ICD-10-CM | POA: Diagnosis not present

## 2016-01-30 DIAGNOSIS — H04123 Dry eye syndrome of bilateral lacrimal glands: Secondary | ICD-10-CM | POA: Diagnosis not present

## 2016-01-30 DIAGNOSIS — H2513 Age-related nuclear cataract, bilateral: Secondary | ICD-10-CM | POA: Diagnosis not present

## 2016-01-31 ENCOUNTER — Ambulatory Visit (INDEPENDENT_AMBULATORY_CARE_PROVIDER_SITE_OTHER): Payer: Medicare HMO | Admitting: Family Medicine

## 2016-01-31 ENCOUNTER — Encounter: Payer: Self-pay | Admitting: Family Medicine

## 2016-01-31 VITALS — BP 122/68 | HR 58 | Temp 98.2°F | Ht 65.0 in | Wt 179.0 lb

## 2016-01-31 DIAGNOSIS — K7030 Alcoholic cirrhosis of liver without ascites: Secondary | ICD-10-CM

## 2016-01-31 DIAGNOSIS — Z0001 Encounter for general adult medical examination with abnormal findings: Secondary | ICD-10-CM

## 2016-01-31 DIAGNOSIS — E785 Hyperlipidemia, unspecified: Secondary | ICD-10-CM

## 2016-01-31 DIAGNOSIS — I10 Essential (primary) hypertension: Secondary | ICD-10-CM

## 2016-01-31 DIAGNOSIS — M79671 Pain in right foot: Secondary | ICD-10-CM | POA: Insufficient documentation

## 2016-01-31 DIAGNOSIS — E875 Hyperkalemia: Secondary | ICD-10-CM | POA: Diagnosis not present

## 2016-01-31 DIAGNOSIS — R69 Illness, unspecified: Secondary | ICD-10-CM | POA: Diagnosis not present

## 2016-01-31 DIAGNOSIS — Z23 Encounter for immunization: Secondary | ICD-10-CM

## 2016-01-31 DIAGNOSIS — K2100 Gastro-esophageal reflux disease with esophagitis, without bleeding: Secondary | ICD-10-CM

## 2016-01-31 DIAGNOSIS — Z0000 Encounter for general adult medical examination without abnormal findings: Secondary | ICD-10-CM

## 2016-01-31 LAB — BASIC METABOLIC PANEL
BUN: 24 mg/dL — AB (ref 6–23)
CHLORIDE: 107 meq/L (ref 96–112)
CO2: 26 mEq/L (ref 19–32)
Calcium: 9.9 mg/dL (ref 8.4–10.5)
Creatinine, Ser: 1.56 mg/dL — ABNORMAL HIGH (ref 0.40–1.50)
GFR: 47.5 mL/min — ABNORMAL LOW (ref >60.00)
Glucose, Bld: 147 mg/dL — ABNORMAL HIGH (ref 70–99)
POTASSIUM: 4.5 meq/L (ref 3.5–5.1)
SODIUM: 138 meq/L (ref 135–145)

## 2016-01-31 LAB — HEMOGLOBIN A1C: HEMOGLOBIN A1C: 5.7 % (ref 4.6–6.5)

## 2016-01-31 NOTE — Assessment & Plan Note (Signed)
Well controlled. No changes made to rxs. 

## 2016-01-31 NOTE — Assessment & Plan Note (Signed)
New- seems consistent with neuropathy. He often is not taking gabapentin. Advised to do so.

## 2016-01-31 NOTE — Assessment & Plan Note (Addendum)
Reviewed preventive care protocols, scheduled due services, and updated immunizations Discussed nutrition, exercise, diet, and healthy lifestyle.  prevnar 13 given today.

## 2016-01-31 NOTE — Assessment & Plan Note (Signed)
Well controlled on tricor. No changes made.

## 2016-01-31 NOTE — Assessment & Plan Note (Signed)
Check BMET today 

## 2016-01-31 NOTE — Addendum Note (Signed)
Addended by: Modena Nunnery on: 01/31/2016 08:55 AM   Modules accepted: Orders

## 2016-01-31 NOTE — Progress Notes (Addendum)
Subjective:   Patient ID: Jeff Ward, male    DOB: [birth year], 66 y.o.   MRN: [id number]  Jeff Ward is a pleasant 66 y.o. year old male who presents to clinic today with Annual Exam (Medicare) and Foot Pain (right)  on 01/31/2016  HPI:  Annual Wellness Visit with Candis Musa, RN on 01/24/16. Note reviewed.  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 Fuller Plan. Taking Nadolol, Lasix, Folvite, iron, aldactone, thiamine and Lactulose. Has been more alert with current dose of lactulose. Does not complain of diarrhea.  Lab Results  Component Value Date   ALT 21 01/24/2016   AST 34 01/24/2016   ALKPHOS 57 01/24/2016   BILITOT 0.6 01/24/2016    Also takes protonix 40 mg daily.  HLD- Taking Tricor 145 mg daily. Lab Results  Component Value Date   CHOL 119 01/24/2016   HDL 24.50 (L) 01/24/2016   LDLCALC 72 01/24/2016   TRIG 111.0 01/24/2016   CHOLHDL 5 01/24/2016    Lab Results  Component Value Date   PSA 0.15 01/24/2016   PSA 0.8 04/20/2013   PSA 0.6 05/21/2012    Lab Results  Component Value Date   WBC 5.3 01/24/2016   HGB 13.3 01/24/2016   HCT 38.3 (L) 01/24/2016   MCV 95.7 01/24/2016   PLT 127.0 (L) 01/24/2016   Right foot pain- bottom of right foot sometimes feels "funny."  Gabapentin does help.  No pain.  No known injury.  Current Outpatient Prescriptions on File Prior to Visit  Medication Sig Dispense Refill  . EPINEPHrine (EPIPEN 2-PAK) 0.3 mg/0.3 mL IJ SOAJ injection Inject 0.3 mg into the muscle once as needed (for severe allergic reaction).    . fenofibrate (TRICOR) 145 MG tablet Take 1 tablet (145 mg total) by mouth every morning. 90 tablet 1  . ferrous sulfate 325 (65 FE) MG tablet Take 1 tablet (325 mg total) by mouth 3 (three) times daily with meals. 90 tablet 5  . folic acid (FOLVITE) 1 MG tablet Take 1 tablet (1 mg total) by mouth daily. 90 tablet 3  . furosemide (LASIX) 20 MG tablet Take 1 tablet (20  mg total) by mouth daily. 90 tablet 2  . gabapentin (NEURONTIN) 300 MG capsule Take 300 mg by mouth 2 (two) times daily.    Marland Kitchen glucosamine-chondroitin 500-400 MG tablet Take 1 tablet by mouth 2 (two) times daily.      Marland Kitchen lactulose (CHRONULAC) 10 GM/15ML solution Take 30 mLs (20 g total) by mouth 3 (three) times daily. 1892 mL 2  . mometasone (ELOCON) 0.1 % lotion Place 4 drops into both ears 2 (two) times daily. For dry skin  0  . Multiple Vitamin (MULTIVITAMIN WITH MINERALS) TABS tablet Take 1 tablet by mouth daily.    . nadolol (CORGARD) 20 MG tablet Take 1 tablet (20 mg total) by mouth daily. 90 tablet 0  . pantoprazole (PROTONIX) 40 MG tablet Take 1 tablet (40 mg total) by mouth 2 (two) times daily. 180 tablet 2  . potassium chloride SA (K-DUR,KLOR-CON) 20 MEQ tablet TAKE 1 TABLET BY MOUTH TWO  TIMES DAILY 180 tablet 1  . spironolactone (ALDACTONE) 50 MG tablet Take 2 tablets (100 mg total) by mouth daily. 180 tablet 2  . tetrahydrozoline 0.05 % ophthalmic solution Place 1 drop into both eyes daily.    Marland Kitchen thiamine 100 MG tablet Take 1 tablet (100 mg total) by mouth 2 (two) times daily. 60 tablet 0  .  TWINRIX 720-20 injection Inject 1 mL into the muscle See admin instructions. 3 doses : 2nd dose given 1 month from 1st dose and 3rd dose given 6 months from 1st dose     No current facility-administered medications on file prior to visit.     Allergies  Allergen Reactions  . Bee Venom Anaphylaxis  . Sulfa Antibiotics Other (See Comments)    Reaction:  Fainting     Past Medical History:  Diagnosis Date  . Arthritis    In back and all joints  . Chronic headache    now resolved  . CKD (chronic kidney disease)   . 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 Depression   . Esophageal varices ([place])   . GERD (gastroesophageal reflux disease)   . GI bleed   . Hiatal hernia   . History of kidney stones 1970's  . Hyperlipidemia 06/2002  . Hypertension   . Psoriasis      Past Surgical History:  Procedure Laterality Date  . CARPAL TUNNEL RELEASE  2005    / right hand  . ESOPHAGOGASTRODUODENOSCOPY N/A 07/24/2015   Procedure: ESOPHAGOGASTRODUODENOSCOPY (EGD);  Surgeon: Lucilla Lame, MD;  Location: [facility] ENDOSCOPY;  Service: Endoscopy;  Laterality: N/A;  Banding  . ESOPHAGOGASTRODUODENOSCOPY (EGD) WITH PROPOFOL N/A 08/14/2015   Procedure: ESOPHAGOGASTRODUODENOSCOPY (EGD) WITH PROPOFOL;  Surgeon: Ladene Artist, MD;  Location: [facility] ENDOSCOPY;  Service: Endoscopy;  Laterality: N/A;  . TONSILLECTOMY AND ADENOIDECTOMY     66 years old  . VASECTOMY      Family History  Problem Relation Age of Onset  . CAD Mother   . Melanoma Father   . CVA Father   . Breast cancer Paternal Aunt   . Alcohol abuse Maternal Grandfather     Social History   Social History  . Marital status: Married    Spouse name: N/A  . Number of children: 2  . Years of education: N/A   Occupational History  . [employer]-Maintence     currently unemployed  .  [place]   Social History Main Topics  . Smoking status: Former Smoker    Years: 35.00    Types: Cigars    Quit date: 04/02/2010  . Smokeless tobacco: Never Used     Comment: Quit 4 years ago  . 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 Drug use: No  . Sexual activity: Yes    Partners: Female     Comment: declined condoms   Other Topics Concern  . Not on file   Social History Narrative   Independent at baseline. Has a cane that he uses occasionally. Lives at home with his wife.   The PMH, PSH, Social History, Family History, Medications, and allergies have been reviewed in [facility], and have been updated if relevant.  Review of Systems  Constitutional: Positive for fatigue. Negative for unexpected weight change.  HENT: Negative.   Eyes: Negative.   Respiratory: Negative.   Cardiovascular: Negative.   Gastrointestinal: Negative.   Genitourinary: Negative.    Musculoskeletal: Negative for back pain, gait problem, joint swelling, neck pain and neck stiffness.       +foot pain  Allergic/Immunologic: Negative.   Neurological: Negative.   Hematological: Negative.   Psychiatric/Behavioral: Negative.   All other systems reviewed and are negative.      Objective:    BP 122/68   Pulse (!) 58  Temp 98.2 F (36.8 C) (Oral)   Ht 5\' 5"  (1.651 m)   Wt 179 lb (81.2 kg)   SpO2 98%   BMI 29.79 kg/m   Wt Readings from Last 3 Encounters:  01/31/16 179 lb (81.2 kg)  01/24/16 178 lb 12 oz (81.1 kg)  11/27/15 176 lb 8 oz (80.1 kg)    Physical Exam  Musculoskeletal:       Right ankle: He exhibits normal range of motion and no swelling.       Right foot: There is normal range of motion, no tenderness, no bony tenderness, no swelling, normal capillary refill, no crepitus, no deformity and no laceration.     General:  pleasant male in no acute distress Eyes:  PERRL Ears:  External ear exam shows no significant lesions or deformities.  TMs normal bilaterally Hearing is grossly normal bilaterally. Nose:  External nasal examination shows no deformity or inflammation. Nasal mucosa are pink and moist without lesions or exudates. Mouth:  Oral mucosa and oropharynx without lesions or exudates.  Teeth in good repair. Neck:  no carotid bruit or thyromegaly no cervical or supraclavicular lymphadenopathy  Lungs:  Normal respiratory effort, chest expands symmetrically. Lungs are clear to auscultation, no crackles or wheezes. Heart:  Normal rate and regular rhythm. S1 and S2 normal without gallop, murmur, click, rub or other extra sounds. Abdomen:  Bowel sounds positive,abdomen soft and non-tender without masses, organomegaly or hernias noted. Pulses:  R and L posterior tibial pulses are full and equal bilaterally  Extremities:  no edema  Right foot- Dictation #1 [id number]  [id number]  Psych:  Good eye contact, not anxious or depressed  appearing      Assessment & Plan:   Alcoholic cirrhosis of liver without ascites (HCC)  Gastroesophageal reflux disease with esophagitis  Visit for well man health check  Essential hypertension, benign  Right foot pain No Follow-up on file.

## 2016-01-31 NOTE — Progress Notes (Signed)
Pre visit review using our clinic review tool, if applicable. No additional management support is needed unless otherwise documented below in the visit note. 

## 2016-01-31 NOTE — Patient Instructions (Addendum)
Great to see you!  Happy New Year! 

## 2016-01-31 NOTE — Assessment & Plan Note (Signed)
Continue current rxs. 

## 2016-02-04 ENCOUNTER — Encounter: Payer: Self-pay | Admitting: Family Medicine

## 2016-02-05 ENCOUNTER — Encounter: Payer: Self-pay | Admitting: Family Medicine

## 2016-02-08 ENCOUNTER — Telehealth: Payer: Self-pay

## 2016-02-08 NOTE — Telephone Encounter (Signed)
Mrs Schroth left v/m requesting refills of pts meds to [place] [facility]. Fax # [phone]. Pt wife will get names of meds and cb Mon with which meds pt needs refilling.

## 2016-02-12 ENCOUNTER — Encounter: Payer: Self-pay | Admitting: Family Medicine

## 2016-02-12 NOTE — Telephone Encounter (Signed)
Pt l/m on triage line regarding this question.  Can you please call him to follow up.  Thanks

## 2016-02-15 ENCOUNTER — Other Ambulatory Visit: Payer: Self-pay | Admitting: Family Medicine

## 2016-02-15 ENCOUNTER — Telehealth: Payer: Self-pay | Admitting: Gastroenterology

## 2016-02-15 DIAGNOSIS — K7031 Alcoholic cirrhosis of liver with ascites: Secondary | ICD-10-CM

## 2016-02-18 ENCOUNTER — Encounter: Payer: Self-pay | Admitting: Family Medicine

## 2016-02-18 ENCOUNTER — Telehealth: Payer: Self-pay | Admitting: Gastroenterology

## 2016-02-18 ENCOUNTER — Other Ambulatory Visit (INDEPENDENT_AMBULATORY_CARE_PROVIDER_SITE_OTHER): Payer: Medicare HMO

## 2016-02-18 DIAGNOSIS — K7031 Alcoholic cirrhosis of liver with ascites: Secondary | ICD-10-CM

## 2016-02-18 DIAGNOSIS — I8501 Esophageal varices with bleeding: Secondary | ICD-10-CM

## 2016-02-18 DIAGNOSIS — K7030 Alcoholic cirrhosis of liver without ascites: Secondary | ICD-10-CM

## 2016-02-18 DIAGNOSIS — R69 Illness, unspecified: Secondary | ICD-10-CM | POA: Diagnosis not present

## 2016-02-18 MED ORDER — SPIRONOLACTONE 50 MG PO TABS: 100.0000 mg | ORAL_TABLET | Freq: Every day | ORAL | 0 refills | Status: DC

## 2016-02-18 NOTE — Telephone Encounter (Signed)
Prescription refill sent to patient's pharmacy and patient notified to schedule appt for further refills.

## 2016-02-18 NOTE — Addendum Note (Signed)
Addended by: Ellamae Sia on: 02/18/2016 10:32 AM   Modules accepted: Orders

## 2016-02-19 ENCOUNTER — Other Ambulatory Visit: Payer: Self-pay | Admitting: Family Medicine

## 2016-02-19 DIAGNOSIS — R69 Illness, unspecified: Secondary | ICD-10-CM | POA: Diagnosis not present

## 2016-02-19 DIAGNOSIS — F332 Major depressive disorder, recurrent severe without psychotic features: Secondary | ICD-10-CM

## 2016-02-19 LAB — AMMONIA: AMMONIA: 192 ug/dL — AB (ref 27–102)

## 2016-02-19 MED ORDER — NADOLOL 20 MG PO TABS: 20.0000 mg | ORAL_TABLET | Freq: Every day | ORAL | 0 refills | Status: DC

## 2016-02-19 NOTE — Telephone Encounter (Signed)
Prescription sent to [facility]. Called patient with no answer and no voicemail to notify him of prescription refill.

## 2016-02-19 NOTE — Telephone Encounter (Signed)
Left v/m requesting cb.

## 2016-02-20 ENCOUNTER — Other Ambulatory Visit: Payer: Self-pay | Admitting: Family Medicine

## 2016-02-20 NOTE — Progress Notes (Signed)
He should be taking it three times daily ( see med list).  Please call him and advise him to take it 3 times daily and we will check another ammonia level in 1 week.

## 2016-02-21 NOTE — Telephone Encounter (Signed)
pts wife called about another issue per lab note and now nothing further needed with this note. Mrs Merson will cb when needed.

## 2016-02-25 ENCOUNTER — Telehealth: Payer: Self-pay | Admitting: Family Medicine

## 2016-02-25 NOTE — Telephone Encounter (Signed)
Left message asking pt to call the office.  I have questions about fmla paperwork I recevied

## 2016-02-25 NOTE — Telephone Encounter (Signed)
Spoke with pt he stated it was same as last year  Paperwork in dr Deborra Medina in box for review and signature

## 2016-02-28 ENCOUNTER — Encounter: Payer: Self-pay | Admitting: Family Medicine

## 2016-02-28 NOTE — Telephone Encounter (Signed)
Paperwork faxed 1/29 and 2/1 Pt aware Copy for pt Copy for file Copy for scan

## 2016-03-05 ENCOUNTER — Ambulatory Visit (INDEPENDENT_AMBULATORY_CARE_PROVIDER_SITE_OTHER): Payer: Medicare HMO | Admitting: Gastroenterology

## 2016-03-05 ENCOUNTER — Encounter: Payer: Self-pay | Admitting: Gastroenterology

## 2016-03-05 ENCOUNTER — Telehealth: Payer: Self-pay | Admitting: Gastroenterology

## 2016-03-05 ENCOUNTER — Other Ambulatory Visit: Payer: Self-pay

## 2016-03-05 ENCOUNTER — Other Ambulatory Visit (INDEPENDENT_AMBULATORY_CARE_PROVIDER_SITE_OTHER): Payer: Medicare HMO

## 2016-03-05 VITALS — BP 110/60 | HR 60 | Ht 65.0 in | Wt 180.4 lb

## 2016-03-05 DIAGNOSIS — I8500 Esophageal varices without bleeding: Secondary | ICD-10-CM

## 2016-03-05 DIAGNOSIS — K7030 Alcoholic cirrhosis of liver without ascites: Secondary | ICD-10-CM

## 2016-03-05 DIAGNOSIS — R69 Illness, unspecified: Secondary | ICD-10-CM | POA: Diagnosis not present

## 2016-03-05 DIAGNOSIS — K7290 Hepatic failure, unspecified without coma: Secondary | ICD-10-CM

## 2016-03-05 DIAGNOSIS — K7682 Hepatic encephalopathy: Secondary | ICD-10-CM | POA: Insufficient documentation

## 2016-03-05 DIAGNOSIS — K7469 Other cirrhosis of liver: Secondary | ICD-10-CM

## 2016-03-05 LAB — CBC WITH DIFFERENTIAL/PLATELET
BASOS ABS: 0.1 10*3/uL (ref 0.0–0.1)
Basophils Relative: 1 % (ref 0.0–3.0)
EOS ABS: 0.3 10*3/uL (ref 0.0–0.7)
Eosinophils Relative: 5.5 % — ABNORMAL HIGH (ref 0.0–5.0)
HEMATOCRIT: 40.1 % (ref 39.0–52.0)
HEMOGLOBIN: 13.7 g/dL (ref 13.0–17.0)
LYMPHS PCT: 32.3 % (ref 12.0–46.0)
Lymphs Abs: 1.8 10*3/uL (ref 0.7–4.0)
MCHC: 34.1 g/dL (ref 30.0–36.0)
MCV: 96.9 fl (ref 78.0–100.0)
Monocytes Absolute: 0.6 10*3/uL (ref 0.1–1.0)
Monocytes Relative: 11.4 % (ref 3.0–12.0)
Neutro Abs: 2.8 10*3/uL (ref 1.4–7.7)
Neutrophils Relative %: 49.8 % (ref 43.0–77.0)
PLATELETS: 133 10*3/uL — AB (ref 150.0–400.0)
RBC: 4.14 Mil/uL — ABNORMAL LOW (ref 4.22–5.81)
RDW: 14.5 % (ref 11.5–15.5)
WBC: 5.6 10*3/uL (ref 4.0–10.5)

## 2016-03-05 LAB — COMPREHENSIVE METABOLIC PANEL
ALBUMIN: 4.3 g/dL (ref 3.5–5.2)
ALK PHOS: 46 U/L (ref 39–117)
ALT: 24 U/L (ref 0–53)
AST: 36 U/L (ref 0–37)
BILIRUBIN TOTAL: 0.7 mg/dL (ref 0.2–1.2)
BUN: 27 mg/dL — ABNORMAL HIGH (ref 6–23)
CALCIUM: 9.9 mg/dL (ref 8.4–10.5)
CO2: 22 mEq/L (ref 19–32)
CREATININE: 1.71 mg/dL — AB (ref 0.40–1.50)
Chloride: 106 mEq/L (ref 96–112)
GFR: 42.71 mL/min — ABNORMAL LOW (ref >60.00)
Glucose, Bld: 137 mg/dL — ABNORMAL HIGH (ref 70–99)
Potassium: 4.4 mEq/L (ref 3.5–5.1)
Sodium: 134 mEq/L — ABNORMAL LOW (ref 135–145)
Total Protein: 8.1 g/dL (ref 6.0–8.3)

## 2016-03-05 LAB — PROTIME-INR
INR: 1.3 ratio — ABNORMAL HIGH (ref 0.8–1.0)
PROTHROMBIN TIME: 13.6 s — AB (ref 9.6–13.1)

## 2016-03-05 LAB — AMMONIA: AMMONIA: 149 umol/L — AB (ref 11–35)

## 2016-03-05 MED ORDER — PANTOPRAZOLE SODIUM 40 MG PO TBEC: 40.0000 mg | DELAYED_RELEASE_TABLET | Freq: Every day | ORAL | 2 refills | Status: DC

## 2016-03-05 MED ORDER — LACTULOSE 10 GM/15ML PO SOLN: 13.0000 g | Freq: Two times a day (BID) | ORAL | 5 refills | Status: DC

## 2016-03-05 MED ORDER — RIFAXIMIN 550 MG PO TABS: 550.0000 mg | ORAL_TABLET | Freq: Two times a day (BID) | ORAL | 5 refills | Status: DC

## 2016-03-05 MED ORDER — RIFAXIMIN 550 MG PO TABS: 550.0000 mg | ORAL_TABLET | Freq: Two times a day (BID) | ORAL | 3 refills | Status: DC

## 2016-03-05 NOTE — Telephone Encounter (Signed)
Reviewed all instructions with patient from today's labs

## 2016-03-05 NOTE — Patient Instructions (Signed)
Your physician has requested that you go to the basement for lab work before leaving today.  Decrease your lactulose to 20 mL twice daily and Protonix 40 mg once daily.   We have sent your demographic information and a prescription for Xifaxan to [facility]. This pharmacy is able to get medication approved through insurance and get you the lowest copay possible. If you have not heard from them within 1 week, please call our office at [phone] to let us know.  Please follow up with Dr. Fuller Plan in 3 months.   Thank you for choosing me and [facility].  Pricilla Riffle. Dagoberto Ligas., MD., Marval Regal

## 2016-03-05 NOTE — Progress Notes (Signed)
History of Present Illness: This is a 66 year old male with alcoholic cirrhosis returning for follow-up. His main complaint is fatigue and diarrhea caused by lactulose. He notes up to 9 stools per day if he takes the current recommended dose of lactulose so he avoids taking it frequently. Review of his blood work on 1/4 shows an elevated ammonia at 192, elevated BUN/creatinine - 24/1.56, slightly low platelets at 127k.  Allergies  Allergen Reactions  . Bee Venom Anaphylaxis  . Sulfa Antibiotics Other (See Comments)    Reaction:  Fainting    Outpatient Medications Prior to Visit  Medication Sig Dispense Refill  . EPINEPHrine (EPIPEN 2-PAK) 0.3 mg/0.3 mL IJ SOAJ injection Inject 0.3 mg into the muscle once as needed (for severe allergic reaction).    . fenofibrate (TRICOR) 145 MG tablet Take 1 tablet (145 mg total) by mouth every morning. 90 tablet 1  . ferrous sulfate 325 (65 FE) MG tablet Take 1 tablet (325 mg total) by mouth 3 (three) times daily with meals. 90 tablet 5  . folic acid (FOLVITE) 1 MG tablet Take 1 tablet (1 mg total) by mouth daily. 90 tablet 3  . furosemide (LASIX) 20 MG tablet Take 1 tablet (20 mg total) by mouth daily. 90 tablet 2  . gabapentin (NEURONTIN) 300 MG capsule Take 300 mg by mouth daily.     Marland Kitchen glucosamine-chondroitin 500-400 MG tablet Take 1 tablet by mouth 2 (two) times daily.      . mometasone (ELOCON) 0.1 % lotion Place 4 drops into both ears 2 (two) times daily. For dry skin  0  . Multiple Vitamin (MULTIVITAMIN WITH MINERALS) TABS tablet Take 1 tablet by mouth daily.    . nadolol (CORGARD) 20 MG tablet Take 1 tablet (20 mg total) by mouth daily. 90 tablet 0  . potassium chloride SA (K-DUR,KLOR-CON) 20 MEQ tablet TAKE 1 TABLET BY MOUTH TWO  TIMES DAILY 180 tablet 1  . spironolactone (ALDACTONE) 50 MG tablet Take 2 tablets (100 mg total) by mouth daily. 60 tablet 0  . tetrahydrozoline 0.05 % ophthalmic solution Place 1 drop into both eyes daily.    Marland Kitchen  thiamine 100 MG tablet Take 1 tablet (100 mg total) by mouth 2 (two) times daily. 60 tablet 0  . TWINRIX 720-20 injection Inject 1 mL into the muscle See admin instructions. 3 doses : 2nd dose given 1 month from 1st dose and 3rd dose given 6 months from 1st dose    . lactulose (CHRONULAC) 10 GM/15ML solution Take 30 mLs (20 g total) by mouth 3 (three) times daily. 1892 mL 2  . pantoprazole (PROTONIX) 40 MG tablet Take 1 tablet (40 mg total) by mouth 2 (two) times daily. 180 tablet 2   No facility-administered medications prior to visit.    Past Medical History:  Diagnosis Date  . Arthritis    In back and all joints  . Chronic headache    now resolved  . CKD (chronic kidney disease)   . 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 Depression   . Esophageal varices ([place])   . GERD (gastroesophageal reflux disease)   . GI bleed   . Hiatal hernia   . History of kidney stones 1970's  . Hyperlipidemia 06/2002  . Hypertension   . Psoriasis    Past Surgical History:  Procedure Laterality Date  . CARPAL TUNNEL RELEASE  2005    / right hand  . ESOPHAGOGASTRODUODENOSCOPY N/A 07/24/2015   Procedure:  ESOPHAGOGASTRODUODENOSCOPY (EGD);  Surgeon: Lucilla Lame, MD;  Location[facility] ENDOSCOPY;  Service: Endoscopy;  Laterality: N/A;  Banding  . ESOPHAGOGASTRODUODENOSCOPY (EGD) WITH PROPOFOL N/A 08/14/2015   Procedure: ESOPHAGOGASTRODUODENOSCOPY (EGD) WITH PROPOFOL;  Surgeon: Ladene Artist, MD;  Location: [facility] ENDOSCOPY;  Service: Endoscopy;  Laterality: N/A;  . TONSILLECTOMY AND ADENOIDECTOMY     66 years old  . VASECTOMY     Social History   Social History  . Marital status: Married    Spouse name: N/A  . Number of children: 2  . Years of education: N/A   Occupational History  . [employer]Maintence     currently unemployed  .  [place]   Social History Main Topics  . Smoking status: Former Smoker    Years: 35.00    Types: Cigars    Quit date: 04/02/2010    . Smokeless tobacco: Never Used     Comment: Quit 4 years ago  . 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 Drug use: No  . Sexual activity: Yes    Partners: Female     Comment: declined condoms   Other Topics Concern  . None   Social History Narrative   Independent at baseline. Has a cane that he uses occasionally. Lives at home with his wife.   Family History  Problem Relation Age of Onset  . CAD Mother   . Melanoma Father   . CVA Father   . Breast cancer Paternal Aunt   . Alcohol abuse Maternal Grandfather       Physical Exam: General: Well developed, well nourished, no acute distress Head: Normocephalic and atraumatic Eyes:  sclerae anicteric, EOMI Ears: Normal auditory acuity Mouth: No deformity or lesions Lungs: Clear throughout to auscultation Heart: Regular rate and rhythm; no murmurs, rubs or bruits Abdomen: Soft, non tender and non distended. No masses, hepatosplenomegaly or hernias noted. Normal Bowel sounds. Diastases recti noted Musculoskeletal: Symmetrical with no gross deformities  Pulses:  Normal pulses noted Extremities: No clubbing, cyanosis, edema or deformities noted Neurological: Alert oriented x 4, grossly nonfocal Psychological:  Alert and cooperative. Normal mood and affect  Assessment and Recommendations:  1. Alcoholic cirrhosis with history of bleeding varices, ascites, HE. Ammonia=192. Slight increase in creatinine. Lactulose leading to excessive diarrhea. Target for 3-4 loose stools per day spread out over the course of the day. Decrease lactulose dose to 14g by mouth twice a day and further adjust if the loose stool target is not achieved. Add Xifaxan 550 mg twice a day if it is covered by his insurance. Continue Nadolol 20 mg daily. Change Protonix to 40 mg daily. Diuretics may need to be adjusted based on CMP results. Advised to obtain and record his weights every day at home, same time of day, same  scale. Repeat CMP, CBC, ammonia today. REV in 3 months.

## 2016-03-06 ENCOUNTER — Other Ambulatory Visit: Payer: Self-pay

## 2016-03-06 MED ORDER — FOLIC ACID 1 MG PO TABS: 1.0000 mg | ORAL_TABLET | Freq: Every day | ORAL | 1 refills | Status: DC

## 2016-03-06 NOTE — Telephone Encounter (Signed)
Pt called requesting new rx of folic acid sent to [facility] on [address] in [place]. Pt needs new rx due to change in insurance. Unable to fill at old pharmacy.  Will send in new rx.

## 2016-03-12 ENCOUNTER — Encounter: Payer: Self-pay | Admitting: Gastroenterology

## 2016-03-12 ENCOUNTER — Other Ambulatory Visit (INDEPENDENT_AMBULATORY_CARE_PROVIDER_SITE_OTHER): Payer: Medicare HMO

## 2016-03-12 DIAGNOSIS — K7469 Other cirrhosis of liver: Secondary | ICD-10-CM

## 2016-03-12 LAB — BASIC METABOLIC PANEL
BUN: 35 mg/dL — ABNORMAL HIGH (ref 6–23)
CO2: 23 mEq/L (ref 19–32)
Calcium: 9.8 mg/dL (ref 8.4–10.5)
Chloride: 107 mEq/L (ref 96–112)
Creatinine, Ser: 1.48 mg/dL (ref 0.40–1.50)
GFR: 50.45 mL/min — ABNORMAL LOW (ref >60.00)
GLUCOSE: 120 mg/dL — AB (ref 70–99)
POTASSIUM: 4.4 meq/L (ref 3.5–5.1)
SODIUM: 135 meq/L (ref 135–145)

## 2016-03-12 LAB — AMMONIA: Ammonia: 98 umol/L — ABNORMAL HIGH (ref 11–35)

## 2016-03-16 ENCOUNTER — Encounter: Payer: Self-pay | Admitting: Family Medicine

## 2016-03-18 ENCOUNTER — Telehealth: Payer: Self-pay | Admitting: Gastroenterology

## 2016-03-18 ENCOUNTER — Encounter: Payer: Self-pay | Admitting: Family Medicine

## 2016-03-18 MED ORDER — RIFAXIMIN 550 MG PO TABS: 550.0000 mg | ORAL_TABLET | Freq: Two times a day (BID) | ORAL | 3 refills | Status: DC

## 2016-03-18 NOTE — Telephone Encounter (Signed)
I will mail him an assistance program application for Xifaxan.  He will fill out his part and mail this to the company

## 2016-03-18 NOTE — Addendum Note (Signed)
Addended by: Marlon Pel on: 03/18/2016 04:00 PM   Modules accepted: Orders

## 2016-03-25 NOTE — Telephone Encounter (Signed)
Hey Dr Deborra Medina, I called Jeff Ward and also sent him a my chart message. His referral was sent to [facility] in [place] for them to call him because [facility] wasn't accepting New patients here. We can schedule him here but not until end of April.

## 2016-03-26 ENCOUNTER — Encounter: Payer: Self-pay | Admitting: Gastroenterology

## 2016-03-31 ENCOUNTER — Telehealth: Payer: Self-pay | Admitting: Family Medicine

## 2016-03-31 ENCOUNTER — Ambulatory Visit (INDEPENDENT_AMBULATORY_CARE_PROVIDER_SITE_OTHER): Payer: Medicare HMO | Admitting: Family Medicine

## 2016-03-31 ENCOUNTER — Encounter: Payer: Self-pay | Admitting: Family Medicine

## 2016-03-31 VITALS — BP 110/64 | HR 49 | Temp 97.8°F | Wt 177.0 lb

## 2016-03-31 DIAGNOSIS — R69 Illness, unspecified: Secondary | ICD-10-CM | POA: Diagnosis not present

## 2016-03-31 DIAGNOSIS — M461 Sacroiliitis, not elsewhere classified: Secondary | ICD-10-CM | POA: Insufficient documentation

## 2016-03-31 DIAGNOSIS — K7030 Alcoholic cirrhosis of liver without ascites: Secondary | ICD-10-CM

## 2016-03-31 LAB — COMPREHENSIVE METABOLIC PANEL
ALBUMIN: 4.2 g/dL (ref 3.5–5.2)
ALK PHOS: 45 U/L (ref 39–117)
ALT: 24 U/L (ref 0–53)
AST: 38 U/L — ABNORMAL HIGH (ref 0–37)
BILIRUBIN TOTAL: 0.8 mg/dL (ref 0.2–1.2)
BUN: 39 mg/dL — AB (ref 6–23)
CO2: 23 mEq/L (ref 19–32)
CREATININE: 1.89 mg/dL — AB (ref 0.40–1.50)
Calcium: 10.2 mg/dL (ref 8.4–10.5)
Chloride: 103 mEq/L (ref 96–112)
GFR: 38.04 mL/min — ABNORMAL LOW (ref >60.00)
Glucose, Bld: 149 mg/dL — ABNORMAL HIGH (ref 70–99)
POTASSIUM: 4.8 meq/L (ref 3.5–5.1)
SODIUM: 134 meq/L — AB (ref 135–145)
TOTAL PROTEIN: 8 g/dL (ref 6.0–8.3)

## 2016-03-31 NOTE — Progress Notes (Signed)
Subjective:   Patient ID: Jeff Ward, male    DOB: 03/03/1950, 66 y.o.   MRN: [id number]  Jeff Ward is a pleasant 66 y.o. year old male who presents to clinic today with Hip Pain (Right hip pain after driving for at least an hour.) and Wants Lab Work  on 03/31/2016  HPI:  Right hip/buttocks pain- notices it after driving for an hour or more. No right LE weakness. No tingling. Has not tried anything for it.  Wants his "liver labs" done today. Current Outpatient Prescriptions on File Prior to Visit  Medication Sig Dispense Refill  . EPINEPHrine (EPIPEN 2-PAK) 0.3 mg/0.3 mL IJ SOAJ injection Inject 0.3 mg into the muscle once as needed (for severe allergic reaction).    . fenofibrate (TRICOR) 145 MG tablet Take 1 tablet (145 mg total) by mouth every morning. 90 tablet 1  . ferrous sulfate 325 (65 FE) MG tablet Take 1 tablet (325 mg total) by mouth 3 (three) times daily with meals. 90 tablet 5  . folic acid (FOLVITE) 1 MG tablet Take 1 tablet (1 mg total) by mouth daily. 90 tablet 1  . furosemide (LASIX) 20 MG tablet Take 1 tablet (20 mg total) by mouth daily. 90 tablet 2  . lactulose (CHRONULAC) 10 GM/15ML solution Take 19.5 mLs (13 g total) by mouth 2 (two) times daily. 1892 mL 5  . mometasone (ELOCON) 0.1 % lotion Place 4 drops into both ears 2 (two) times daily. For dry skin  0  . nadolol (CORGARD) 20 MG tablet Take 1 tablet (20 mg total) by mouth daily. 90 tablet 0  . pantoprazole (PROTONIX) 40 MG tablet Take 1 tablet (40 mg total) by mouth daily. 90 tablet 2  . potassium chloride SA (K-DUR,KLOR-CON) 20 MEQ tablet TAKE 1 TABLET BY MOUTH TWO  TIMES DAILY 180 tablet 1  . rifaximin (XIFAXAN) 550 MG TABS tablet Take 1 tablet (550 mg total) by mouth 2 (two) times daily. 60 tablet 3  . rifaximin (XIFAXAN) 550 MG TABS tablet Take 1 tablet (550 mg total) by mouth 2 (two) times daily. 180 tablet 3  . spironolactone (ALDACTONE) 50 MG tablet Take 2 tablets (100 mg total) by mouth daily. 60  tablet 0  . tetrahydrozoline 0.05 % ophthalmic solution Place 1 drop into both eyes daily.    Marland Kitchen thiamine 100 MG tablet Take 1 tablet (100 mg total) by mouth 2 (two) times daily. 60 tablet 0  . TWINRIX 720-20 injection Inject 1 mL into the muscle See admin instructions. 3 doses : 2nd dose given 1 month from 1st dose and 3rd dose given 6 months from 1st dose     No current facility-administered medications on file prior to visit.     Allergies  Allergen Reactions  . Bee Venom Anaphylaxis  . Sulfa Antibiotics Other (See Comments)    Reaction:  Fainting     Past Medical History:  Diagnosis Date  . Arthritis    In back and all joints  . Chronic headache    now resolved  . CKD (chronic kidney disease)   . 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 Depression   . Esophageal varices ([place])   . GERD (gastroesophageal reflux disease)   . GI bleed   . Hiatal hernia   . History of kidney stones 1970's  . Hyperlipidemia 06/2002  . Hypertension   . Psoriasis     Past Surgical History:  Procedure Laterality Date  .  CARPAL TUNNEL RELEASE  2005    / right hand  . ESOPHAGOGASTRODUODENOSCOPY N/A 07/24/2015   Procedure: ESOPHAGOGASTRODUODENOSCOPY (EGD);  Surgeon: Lucilla Lame, MD;  Location: [facility] ENDOSCOPY;  Service: Endoscopy;  Laterality: N/A;  Banding  . ESOPHAGOGASTRODUODENOSCOPY (EGD) WITH PROPOFOL N/A 08/14/2015   Procedure: ESOPHAGOGASTRODUODENOSCOPY (EGD) WITH PROPOFOL;  Surgeon: Ladene Artist, MD;  Location: [facility] ENDOSCOPY;  Service: Endoscopy;  Laterality: N/A;  . TONSILLECTOMY AND ADENOIDECTOMY     66 years old  . VASECTOMY      Family History  Problem Relation Age of Onset  . CAD Mother   . Melanoma Father   . CVA Father   . Breast cancer Paternal Aunt   . Alcohol abuse Maternal Grandfather     Social History   Social History  . Marital status: Married    Spouse name: N/A  . Number of children: 2  . Years of education: N/A   Occupational History  .  [employer]     currently unemployed  .  [employer] [place]   Social History Main Topics  . Smoking status: Former Smoker    Years: 35.00    Types: Cigars    Quit date: 04/02/2010  . Smokeless tobacco: Never Used     Comment: Quit 4 years ago  . 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 Drug use: No  . Sexual activity: Yes    Partners: Female     Comment: declined condoms   Other Topics Concern  . Not on file   Social History Narrative   Independent at baseline. Has a cane that he uses occasionally. Lives at home with his wife.   The PMH, PSH, Social History, Family History, Medications, and allergies have been reviewed in [facility], and have been updated if relevant.   Review of Systems  Constitutional: Negative.   Gastrointestinal: Negative.   Musculoskeletal: Positive for back pain. Negative for joint swelling, myalgias, neck pain and neck stiffness.  Hematological: Negative.   Psychiatric/Behavioral: Negative.   All other systems reviewed and are negative.      Objective:    BP 110/64 (BP Location: Left Arm, Patient Position: Sitting, Cuff Size: Normal)   Pulse (!) 49   Temp 97.8 F (36.6 C) (Oral)   Wt 177 lb (80.3 kg)   SpO2 99%   BMI 29.45 kg/m    Physical Exam  Constitutional: He is oriented to person, place, and time. He appears well-developed and well-nourished. No distress.  HENT:  Head: Normocephalic.  Eyes: Conjunctivae are normal.  Cardiovascular: Normal rate.   Pulmonary/Chest: Effort normal.  Musculoskeletal:  No TTP over right trochanteric bursa Does have some tenderness of right SI joint  Neurological: He is alert and oriented to person, place, and time.  Skin: Skin is warm and dry. He is not diaphoretic.  Psychiatric: He has a normal mood and affect. His behavior is normal. Judgment and thought content normal.  Nursing note and vitals reviewed.         Assessment & Plan:    Alcoholic cirrhosis of liver without ascites ([place]) - Plan: Ammonia, Comprehensive metabolic panel  Chronic right SI joint pain No Follow-up on file.

## 2016-03-31 NOTE — Telephone Encounter (Signed)
Pt dropped off fmla paperwork from [employer] In [place]ul[place] Shouts in box

## 2016-03-31 NOTE — Assessment & Plan Note (Signed)
Given handout from sports medicine advisor discussing supportive care and outlining home exercises. Call or return to clinic prn if these symptoms worsen or fail to improve as anticipated. The patient indicates understanding of these issues and agrees with the plan.

## 2016-03-31 NOTE — Progress Notes (Signed)
Pre visit review using our clinic review tool, if applicable. No additional management support is needed unless otherwise documented below in the visit note. 

## 2016-03-31 NOTE — Telephone Encounter (Signed)
Forms signed and in my box. 

## 2016-04-01 LAB — AMMONIA: AMMONIA: 94 umol/L — AB (ref 11–35)

## 2016-04-01 NOTE — Telephone Encounter (Signed)
Paperwork faxed 3/6/[facility]

## 2016-04-02 ENCOUNTER — Other Ambulatory Visit: Payer: Self-pay | Admitting: Family Medicine

## 2016-04-02 DIAGNOSIS — N289 Disorder of kidney and ureter, unspecified: Secondary | ICD-10-CM

## 2016-04-03 NOTE — Telephone Encounter (Signed)
Copy for pt  Copy for file Copy for scan  Sent my chart message letting pt know paperwork was fax and a copy was here for him

## 2016-04-04 ENCOUNTER — Encounter: Payer: Self-pay | Admitting: Family Medicine

## 2016-04-04 ENCOUNTER — Other Ambulatory Visit: Payer: Self-pay | Admitting: Family Medicine

## 2016-04-04 DIAGNOSIS — G5602 Carpal tunnel syndrome, left upper limb: Secondary | ICD-10-CM

## 2016-04-07 ENCOUNTER — Encounter: Payer: Self-pay | Admitting: Family Medicine

## 2016-04-10 ENCOUNTER — Telehealth: Payer: Self-pay | Admitting: Gastroenterology

## 2016-04-11 DIAGNOSIS — M1812 Unilateral primary osteoarthritis of first carpometacarpal joint, left hand: Secondary | ICD-10-CM | POA: Diagnosis not present

## 2016-04-11 NOTE — Telephone Encounter (Signed)
Patient returning phone call. Best # [phone]

## 2016-04-11 NOTE — Telephone Encounter (Signed)
Left message for patient to return my call.

## 2016-04-11 NOTE — Telephone Encounter (Signed)
Left another message for patient to return my call.

## 2016-04-14 ENCOUNTER — Encounter (HOSPITAL_COMMUNITY): Payer: Self-pay | Admitting: Clinical

## 2016-04-14 ENCOUNTER — Ambulatory Visit (INDEPENDENT_AMBULATORY_CARE_PROVIDER_SITE_OTHER): Payer: Medicare HMO | Admitting: Clinical

## 2016-04-14 DIAGNOSIS — R69 Illness, unspecified: Secondary | ICD-10-CM | POA: Diagnosis not present

## 2016-04-14 DIAGNOSIS — F4321 Adjustment disorder with depressed mood: Secondary | ICD-10-CM

## 2016-04-14 DIAGNOSIS — F331 Major depressive disorder, recurrent, moderate: Secondary | ICD-10-CM

## 2016-04-14 DIAGNOSIS — F4329 Adjustment disorder with other symptoms: Secondary | ICD-10-CM

## 2016-04-14 DIAGNOSIS — F4381 Prolonged grief disorder: Secondary | ICD-10-CM

## 2016-04-14 DIAGNOSIS — F1021 Alcohol dependence, in remission: Secondary | ICD-10-CM | POA: Diagnosis not present

## 2016-04-14 DIAGNOSIS — F411 Generalized anxiety disorder: Secondary | ICD-10-CM

## 2016-04-14 DIAGNOSIS — Z634 Disappearance and death of family member: Secondary | ICD-10-CM

## 2016-04-14 NOTE — Telephone Encounter (Signed)
Patient wants to inform Dr. Fuller Plan that he has been approved for patient assistance for Xifaxan and has been taking it since Friday. Informed patient that I will let Dr. Fuller Plan know and document that in his chart.

## 2016-04-14 NOTE — Telephone Encounter (Signed)
Left another message for patient to return my call.
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
## 2016-04-29 ENCOUNTER — Ambulatory Visit (INDEPENDENT_AMBULATORY_CARE_PROVIDER_SITE_OTHER): Payer: Medicare HMO | Admitting: Podiatry

## 2016-04-29 ENCOUNTER — Encounter: Payer: Self-pay | Admitting: Podiatry

## 2016-04-29 ENCOUNTER — Ambulatory Visit (INDEPENDENT_AMBULATORY_CARE_PROVIDER_SITE_OTHER): Payer: Medicare HMO

## 2016-04-29 VITALS — BP 132/70 | HR 51 | Resp 16

## 2016-04-29 DIAGNOSIS — M79671 Pain in right foot: Secondary | ICD-10-CM | POA: Diagnosis not present

## 2016-04-29 DIAGNOSIS — L851 Acquired keratosis [keratoderma] palmaris et plantaris: Secondary | ICD-10-CM

## 2016-04-29 DIAGNOSIS — M79672 Pain in left foot: Secondary | ICD-10-CM

## 2016-04-29 DIAGNOSIS — M779 Enthesopathy, unspecified: Secondary | ICD-10-CM

## 2016-04-29 DIAGNOSIS — L84 Corns and callosities: Secondary | ICD-10-CM | POA: Diagnosis not present

## 2016-04-29 NOTE — Progress Notes (Signed)
   Subjective:    Patient ID: Jeff Ward, male    DOB: 05/10/1950, 66 y.o.   MRN: [id number]  HPI    Review of Systems  Constitutional: Positive for fatigue.  Endocrine: Positive for cold intolerance.  Musculoskeletal: Positive for arthralgias and gait problem.  Skin: Positive for rash.  All other systems reviewed and are negative.      Objective:   Physical Exam        Assessment & Plan:

## 2016-04-30 ENCOUNTER — Encounter (HOSPITAL_COMMUNITY): Payer: Self-pay | Admitting: Clinical

## 2016-04-30 ENCOUNTER — Ambulatory Visit (INDEPENDENT_AMBULATORY_CARE_PROVIDER_SITE_OTHER): Payer: Medicare HMO | Admitting: Clinical

## 2016-04-30 ENCOUNTER — Telehealth: Payer: Self-pay | Admitting: Gastroenterology

## 2016-04-30 DIAGNOSIS — F331 Major depressive disorder, recurrent, moderate: Secondary | ICD-10-CM | POA: Diagnosis not present

## 2016-04-30 DIAGNOSIS — F4321 Adjustment disorder with depressed mood: Secondary | ICD-10-CM

## 2016-04-30 DIAGNOSIS — R69 Illness, unspecified: Secondary | ICD-10-CM | POA: Diagnosis not present

## 2016-04-30 DIAGNOSIS — F411 Generalized anxiety disorder: Secondary | ICD-10-CM

## 2016-04-30 DIAGNOSIS — Z634 Disappearance and death of family member: Secondary | ICD-10-CM | POA: Diagnosis not present

## 2016-04-30 DIAGNOSIS — F4329 Adjustment disorder with other symptoms: Secondary | ICD-10-CM | POA: Diagnosis not present

## 2016-04-30 DIAGNOSIS — F1021 Alcohol dependence, in remission: Secondary | ICD-10-CM | POA: Diagnosis not present

## 2016-04-30 NOTE — Telephone Encounter (Signed)
No answer/machine 

## 2016-04-30 NOTE — Progress Notes (Signed)
Patient ID: Jeff Ward, male   DOB: 04-27-1950, 66 y.o.   MRN: [id number]   Subjective: Patient presents to the office today for chief complaint of painful callus lesions of the feet. Patient states that the pain is ongoing and is affecting their ability to ambulate without pain. Patient presents today for further treatment and evaluation.  Objective:  Physical Exam General: Alert and oriented x3 in no acute distress  Dermatology: Hyperkeratotic lesion present on the weightbearing surface of the bilateral feet 4. Pain on palpation with a central nucleated core noted.  Skin is warm, dry and supple bilateral lower extremities. Negative for open lesions or macerations.  Vascular: Palpable pedal pulses bilaterally. No edema or erythema noted. Capillary refill within normal limits.  Neurological: Epicritic and protective threshold grossly intact bilaterally.   Musculoskeletal Exam: Pain on palpation at the keratotic lesion noted. Range of motion within normal limits bilateral. Muscle strength 5/5 in all groups bilateral.  Assessment: #1 painful callus lesions weightbearing surface of the bilateral feet 4   Plan of Care:  #1 Patient evaluated #2 Excisional debridement of  keratoic lesion using a chisel blade was performed without incident.  #3 Treated area(s) with Salinocaine and dressed with light dressing. #4 recommend urea 40% cream  #5 Patient is to return to the clinic PRN.   Edrick Kins, DPM [facility]  Dr. Edrick Kins, [place]                                        [place], [place] [address]                Office [phone]  Fax [phone]

## 2016-04-30 NOTE — Progress Notes (Signed)
   THERAPIST PROGRESS NOTE  Session Time: 8:04 - 8:59  Participation Level: Active  Behavioral Response: CasualAlertDepressed  Type of Therapy: Individual Therapy  Treatment Goals addressed: Improve Psychiatric Symptoms, Elevate Mood (increased enjoyment in activities, acceptance about what is and what is not possible), Improve Unhelpful Thought Patterns, Emotional Regulation Skills (anger and stress management), decrease anxiety, process grief, learn about diagnosis, healthy coping skills  Interventions: Motivational Interviewing, CBT, Grounding & Mindfulness Techniques, psychoeducation  Summary: Leor A Demuro is a 80. y.o. male who presents with Generalized Anxiety Disorder, and Major Depressive Disorder, recurrent, moderate, and Alcohol use disorder in early remission, dependence, and complicated grief  Suicidal/Homicidal: No -without intent/plan  Therapist Response: Arjen met with clinician for an individual session. Kelin discussed his psychiatric symptoms, his current life events and his goals for therapy. Bryse shared he has been depressed. He shared that two friends had passed away recently. He shared that he has been very concerned about his health. Clinician asked open ended questions and Tyrick shared his concerns and the way he is currently managing his stress. Clinician introduced some basic grounding and mindfulness techniques. Clinician explained the process, purpose and practice of the techniques. Client and clinician practiced the techniques together. Clinician introduced some basic cbt concepts. Client and clinician briefly discussed the thought emotion connection. Clinician gave Areg a homework packet on stress tolerance which he agreed to complete before next session      Plan: Return again in 1- 2 week  Diagnosis:     Axis I: Generalized Anxiety Disorder, Major Depressive Disorder, recurrent, moderate, and Alcohol use disorder in early remission, dependence, and  complicated grief    Briell Paulette A, LCSW 04/30/2016

## 2016-05-02 NOTE — Telephone Encounter (Signed)
Patient notified that he just needs a 3 month follow up from his last appt.  I scheduled him for 06/09/16 9:00

## 2016-05-12 DIAGNOSIS — N183 Chronic kidney disease, stage 3 (moderate): Secondary | ICD-10-CM | POA: Diagnosis not present

## 2016-05-12 DIAGNOSIS — I10 Essential (primary) hypertension: Secondary | ICD-10-CM | POA: Diagnosis not present

## 2016-05-13 DIAGNOSIS — N183 Chronic kidney disease, stage 3 (moderate): Secondary | ICD-10-CM | POA: Diagnosis not present

## 2016-05-15 ENCOUNTER — Ambulatory Visit (INDEPENDENT_AMBULATORY_CARE_PROVIDER_SITE_OTHER): Payer: Medicare HMO | Admitting: Clinical

## 2016-05-15 ENCOUNTER — Encounter (HOSPITAL_COMMUNITY): Payer: Self-pay | Admitting: Clinical

## 2016-05-15 DIAGNOSIS — Z634 Disappearance and death of family member: Secondary | ICD-10-CM | POA: Diagnosis not present

## 2016-05-15 DIAGNOSIS — F331 Major depressive disorder, recurrent, moderate: Secondary | ICD-10-CM

## 2016-05-15 DIAGNOSIS — F1021 Alcohol dependence, in remission: Secondary | ICD-10-CM

## 2016-05-15 DIAGNOSIS — R69 Illness, unspecified: Secondary | ICD-10-CM | POA: Diagnosis not present

## 2016-05-15 DIAGNOSIS — F411 Generalized anxiety disorder: Secondary | ICD-10-CM

## 2016-05-15 DIAGNOSIS — F4329 Adjustment disorder with other symptoms: Secondary | ICD-10-CM

## 2016-05-15 DIAGNOSIS — F4321 Adjustment disorder with depressed mood: Secondary | ICD-10-CM

## 2016-05-15 DIAGNOSIS — F4381 Prolonged grief disorder: Secondary | ICD-10-CM

## 2016-05-15 NOTE — Progress Notes (Signed)
   THERAPIST PROGRESS NOTE  Session Time: 7:03 - 8:00  Participation Level: Active  Behavioral Response: NeatAlertDepressed  Type of Therapy: Individual Therapy  Treatment Goals addressed: Improve Psychiatric Symptoms, Elevate Mood (increased enjoyment in activities, acceptance about what is and what is not possible),  healthy coping skills  Interventions: Motivational Interviewing  Summary: Chaim A Bulthuis is a 42. y.o. male who presents with Generalized Anxiety Disorder, and Major Depressive Disorder, recurrent, moderate, and Alcohol use disorder in early remission, dependence, and complicated grief  Suicidal/Homicidal: No -without intent/plan  Therapist Response: Deshone met with clinician for an individual session. Tavius discussed her psychiatric symptoms, her current life events and her homework. Khylan shared that he had some good days and some depressed days. Clinician asked open ended questions and Rangel shared some about his life stressors. The one he is most concerned about is the fact that his kidneys are only producing at 30%. Clinician asked open ended questions and he shared that he will find out about what he can do  about it on Monday. Clinician asked open ended questions and Katrell shared that his thoughts and emotions about his health, and the possibility of dying. He shared that he is not currently afraid of death. He shared that he is putting his affairs in order though his hope is that he will continue to live for a long time. (he denied any suicidal or homicidal ideation)  He shared the bigger depression comes from not being able to do the things he  likes to do. Clinician asked open ended questions about the things he is currently able to do that he enjoys. Client and clinician discussed the importance of continuing to engage in those activities. Arn shared that he did not complete his homework packet. He shared that he has continued to practice his grounding and mindfulness  techniques.  Plan: Return again in 1- 2 week  Diagnosis:     Axis I: Generalized Anxiety Disorder, Major Depressive Disorder, recurrent, moderate, and Alcohol use disorder in early remission, dependence, and complicated grief   Lyvia Mondesir A, LCSW 05/15/2016

## 2016-05-29 ENCOUNTER — Other Ambulatory Visit: Payer: Self-pay | Admitting: Gastroenterology

## 2016-05-29 DIAGNOSIS — I8501 Esophageal varices with bleeding: Secondary | ICD-10-CM

## 2016-05-29 DIAGNOSIS — K7030 Alcoholic cirrhosis of liver without ascites: Secondary | ICD-10-CM

## 2016-06-09 ENCOUNTER — Encounter: Payer: Self-pay | Admitting: Gastroenterology

## 2016-06-09 ENCOUNTER — Encounter (HOSPITAL_COMMUNITY): Payer: Self-pay | Admitting: Clinical

## 2016-06-09 ENCOUNTER — Other Ambulatory Visit (INDEPENDENT_AMBULATORY_CARE_PROVIDER_SITE_OTHER): Payer: Medicare HMO

## 2016-06-09 ENCOUNTER — Ambulatory Visit (INDEPENDENT_AMBULATORY_CARE_PROVIDER_SITE_OTHER): Payer: Medicare HMO | Admitting: Gastroenterology

## 2016-06-09 ENCOUNTER — Ambulatory Visit (INDEPENDENT_AMBULATORY_CARE_PROVIDER_SITE_OTHER): Payer: Medicare HMO | Admitting: Clinical

## 2016-06-09 VITALS — BP 110/60 | HR 58 | Ht 65.0 in | Wt 181.2 lb

## 2016-06-09 DIAGNOSIS — Z634 Disappearance and death of family member: Secondary | ICD-10-CM

## 2016-06-09 DIAGNOSIS — F331 Major depressive disorder, recurrent, moderate: Secondary | ICD-10-CM | POA: Diagnosis not present

## 2016-06-09 DIAGNOSIS — F411 Generalized anxiety disorder: Secondary | ICD-10-CM | POA: Diagnosis not present

## 2016-06-09 DIAGNOSIS — K7031 Alcoholic cirrhosis of liver with ascites: Secondary | ICD-10-CM

## 2016-06-09 DIAGNOSIS — I8501 Esophageal varices with bleeding: Secondary | ICD-10-CM

## 2016-06-09 DIAGNOSIS — K7030 Alcoholic cirrhosis of liver without ascites: Secondary | ICD-10-CM | POA: Diagnosis not present

## 2016-06-09 DIAGNOSIS — I8500 Esophageal varices without bleeding: Secondary | ICD-10-CM

## 2016-06-09 DIAGNOSIS — F4381 Prolonged grief disorder: Secondary | ICD-10-CM

## 2016-06-09 DIAGNOSIS — Z23 Encounter for immunization: Secondary | ICD-10-CM | POA: Diagnosis not present

## 2016-06-09 DIAGNOSIS — F4329 Adjustment disorder with other symptoms: Secondary | ICD-10-CM | POA: Diagnosis not present

## 2016-06-09 DIAGNOSIS — R69 Illness, unspecified: Secondary | ICD-10-CM | POA: Diagnosis not present

## 2016-06-09 DIAGNOSIS — F4321 Adjustment disorder with depressed mood: Secondary | ICD-10-CM

## 2016-06-09 DIAGNOSIS — F1021 Alcohol dependence, in remission: Secondary | ICD-10-CM

## 2016-06-09 LAB — CBC WITH DIFFERENTIAL/PLATELET
BASOS ABS: 0 10*3/uL (ref 0.0–0.1)
Basophils Relative: 0.7 % (ref 0.0–3.0)
Eosinophils Absolute: 0.4 10*3/uL (ref 0.0–0.7)
Eosinophils Relative: 5.8 % — ABNORMAL HIGH (ref 0.0–5.0)
HEMATOCRIT: 39.7 % (ref 39.0–52.0)
HEMOGLOBIN: 13.6 g/dL (ref 13.0–17.0)
LYMPHS PCT: 32.5 % (ref 12.0–46.0)
Lymphs Abs: 2.3 10*3/uL (ref 0.7–4.0)
MCHC: 34.1 g/dL (ref 30.0–36.0)
MCV: 96.2 fl (ref 78.0–100.0)
Monocytes Absolute: 0.8 10*3/uL (ref 0.1–1.0)
Monocytes Relative: 10.7 % (ref 3.0–12.0)
NEUTROS ABS: 3.6 10*3/uL (ref 1.4–7.7)
Neutrophils Relative %: 50.3 % (ref 43.0–77.0)
PLATELETS: 130 10*3/uL — AB (ref 150.0–400.0)
RBC: 4.13 Mil/uL — ABNORMAL LOW (ref 4.22–5.81)
RDW: 14.4 % (ref 11.5–15.5)
WBC: 7.2 10*3/uL (ref 4.0–10.5)

## 2016-06-09 LAB — COMPREHENSIVE METABOLIC PANEL
ALT: 19 U/L (ref 0–53)
AST: 30 U/L (ref 0–37)
Albumin: 4.3 g/dL (ref 3.5–5.2)
Alkaline Phosphatase: 45 U/L (ref 39–117)
BUN: 35 mg/dL — ABNORMAL HIGH (ref 6–23)
CALCIUM: 10 mg/dL (ref 8.4–10.5)
CHLORIDE: 103 meq/L (ref 96–112)
CO2: 24 meq/L (ref 19–32)
Creatinine, Ser: 1.55 mg/dL — ABNORMAL HIGH (ref 0.40–1.50)
GFR: 47.8 mL/min — AB (ref >60.00)
Glucose, Bld: 120 mg/dL — ABNORMAL HIGH (ref 70–99)
Potassium: 4.2 mEq/L (ref 3.5–5.1)
Sodium: 134 mEq/L — ABNORMAL LOW (ref 135–145)
Total Bilirubin: 1 mg/dL (ref 0.2–1.2)
Total Protein: 8 g/dL (ref 6.0–8.3)

## 2016-06-09 LAB — AMMONIA: Ammonia: 63 umol/L — ABNORMAL HIGH (ref 11–35)

## 2016-06-09 LAB — PROTIME-INR
INR: 1.3 ratio — AB (ref 0.8–1.0)
Prothrombin Time: 13.5 s — ABNORMAL HIGH (ref 9.6–13.1)

## 2016-06-09 MED ORDER — LACTULOSE 10 GM/15ML PO SOLN: 13.0000 g | Freq: Two times a day (BID) | ORAL | 5 refills | Status: DC

## 2016-06-09 MED ORDER — NADOLOL 20 MG PO TABS: ORAL_TABLET | ORAL | 3 refills | Status: DC

## 2016-06-09 NOTE — Progress Notes (Signed)
History of Present Illness: This is a 66 year old male with alcoholic cirrhosis with ascites returning for follow-up. He states his weights are stable. He is sleeping well. His energy level was good. His PCP referred him to see a renal specialist in [place] and I do not have records from that visit. The patient states he had blood work and an abdominal ultrasound performed. He is not aware of the results except they were ok.   Allergies  Allergen Reactions  . Bee Venom Anaphylaxis  . Sulfa Antibiotics Other (See Comments)    Reaction:  Fainting    Outpatient Medications Prior to Visit  Medication Sig Dispense Refill  . EPINEPHrine (EPIPEN 2-PAK) 0.3 mg/0.3 mL IJ SOAJ injection Inject 0.3 mg into the muscle once as needed (for severe allergic reaction).    . fenofibrate (TRICOR) 145 MG tablet Take 1 tablet (145 mg total) by mouth every morning. 90 tablet 1  . ferrous sulfate 325 (65 FE) MG tablet Take 1 tablet (325 mg total) by mouth 3 (three) times daily with meals. 90 tablet 5  . folic acid (FOLVITE) 1 MG tablet Take 1 tablet (1 mg total) by mouth daily. 90 tablet 1  . furosemide (LASIX) 20 MG tablet Take 1 tablet (20 mg total) by mouth daily. 90 tablet 2  . lactulose (CHRONULAC) 10 GM/15ML solution Take 19.5 mLs (13 g total) by mouth 2 (two) times daily. 1892 mL 5  . mometasone (ELOCON) 0.1 % lotion Place 4 drops into both ears 2 (two) times daily. For dry skin  0  . nadolol (CORGARD) 20 MG tablet TAKE ONE TABLET BY MOUTH ONCE DAILY. NEED OFFICE VISIT FOR FURTHER REFILLS 90 tablet 0  . pantoprazole (PROTONIX) 40 MG tablet Take 1 tablet (40 mg total) by mouth daily. 90 tablet 2  . potassium chloride SA (K-DUR,KLOR-CON) 20 MEQ tablet TAKE 1 TABLET BY MOUTH TWO  TIMES DAILY 180 tablet 1  . rifaximin (XIFAXAN) 550 MG TABS tablet Take 1 tablet (550 mg total) by mouth 2 (two) times daily. 60 tablet 3  . rifaximin (XIFAXAN) 550 MG TABS tablet Take 1 tablet (550 mg total) by mouth 2 (two)  times daily. 180 tablet 3  . spironolactone (ALDACTONE) 50 MG tablet Take 2 tablets (100 mg total) by mouth daily. 60 tablet 0  . tetrahydrozoline 0.05 % ophthalmic solution Place 1 drop into both eyes daily.    Marland Kitchen thiamine 100 MG tablet Take 1 tablet (100 mg total) by mouth 2 (two) times daily. 60 tablet 0  . TWINRIX 720-20 injection Inject 1 mL into the muscle See admin instructions. 3 doses : 2nd dose given 1 month from 1st dose and 3rd dose given 6 months from 1st dose     No facility-administered medications prior to visit.    Past Medical History:  Diagnosis Date  . Arthritis    In back and all joints  . Chronic headache    now resolved  . CKD (chronic kidney disease)   . 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 Depression   . Esophageal varices ([place])   . GERD (gastroesophageal reflux disease)   . GI bleed   . Hiatal hernia   . History of kidney stones 1970's  . Hyperlipidemia 06/2002  . Hypertension   . Psoriasis    Past Surgical History:  Procedure Laterality Date  . CARPAL TUNNEL RELEASE  2005    / right hand  . ESOPHAGOGASTRODUODENOSCOPY N/A 07/24/2015  Procedure: ESOPHAGOGASTRODUODENOSCOPY (EGD);  Surgeon: Lucilla Lame, MD;  Location: [facility] ENDOSCOPY;  Service: Endoscopy;  Laterality: N/A;  Banding  . ESOPHAGOGASTRODUODENOSCOPY (EGD) WITH PROPOFOL N/A 08/14/2015   Procedure: ESOPHAGOGASTRODUODENOSCOPY (EGD) WITH PROPOFOL;  Surgeon: Ladene Artist, MD;  Location: [facility] ENDOSCOPY;  Service: Endoscopy;  Laterality: N/A;  . TONSILLECTOMY AND ADENOIDECTOMY     66 years old  . VASECTOMY     Social History   Social History  . Marital status: Married    Spouse name: N/A  . Number of children: 2  . Years of education: N/A   Occupational History  . retired    Social History Main Topics  . Smoking status: Former Smoker    Years: 35.00    Types: Cigars    Quit date: 04/02/2010  . Smokeless tobacco: Never Used     Comment: Quit 4 years ago  . Alcohol use  No     Comment: Used to be a heavy drinker, quit for 5 months now  . Drug use: No  . Sexual activity: Yes    Partners: Female     Comment: declined condoms   Other Topics Concern  . None   Social History Narrative   Independent at baseline. Has a cane that he uses occasionally. Lives at home with his wife.   Family History  Problem Relation Age of Onset  . CAD Mother   . Melanoma Father   . CVA Father   . Breast cancer Paternal Aunt   . Alcohol abuse Maternal Grandfather     Physical Exam: General: Well developed, well nourished, no acute distress Head: Normocephalic and atraumatic Eyes:  sclerae anicteric, EOMI Ears: Normal auditory acuity Mouth: No deformity or lesions Lungs: Clear throughout to auscultation Heart: Regular rate and rhythm; no murmurs, rubs or bruits Abdomen: Soft, non tender and non distended. No masses, hepatosplenomegaly or hernias noted. Normal Bowel sounds Musculoskeletal: Symmetrical with no gross deformities  Pulses:  Normal pulses noted Extremities: No clubbing, cyanosis, edema or deformities noted Neurological: Alert oriented x 4, grossly nonfocal Psychological:  Alert and cooperative. Normal mood and affect  Assessment and Recommendations:  1. Alcoholic cirrhosis history of bleeding varices, ascites, hepatic encephalopathy. Recent creatinine increased to 1.89. Continue furosemide 20 mg daily and spironolactone 50 mg daily. Continue nadolol 20 mg daily, rifaximin 550 mg twice a day and lactulose 13 g twice a day. Schedule surveillance EGD due in 07/2016. Abd [place] for [facility] screening - attempt to locate recent US done in [place]. Who cannot locate the ultrasound report or liver was not imaged will proceed with RUQ ultrasound. He received last dose of Hep A/B vaccine today. CMP, CBC, ammonia, PT/INR today. No more than 2g Tylenol daily. The risks (including bleeding, perforation, infection, missed lesions, medication reactions and possible hospitalization  or surgery if complications occur), benefits, and alternatives to endoscopy with possible biopsy and possible dilation were discussed with the patient and they consent to proceed. REV in 3 months.

## 2016-06-09 NOTE — Progress Notes (Signed)
   THERAPIST PROGRESS NOTE  Session Time: 7:05 - 8:00   Participation Level: Active  Behavioral Response: CasualAlertDepressed  Type of Therapy: Individual Therapy  Treatment Goals addressed: Improve Psychiatric Symptoms,  Improve Unhelpful Thought Patterns, Emotional Regulation Skills,  learn about diagnosis, healthy coping skills  Interventions: Motivational Interviewing, CBT, Grounding & Mindfulness Techniques, psychoeducation  Summary: Travarius A Dante is a 8. y.o. 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 Clerance discussed her psychiatric symptoms, her current life events and her homework. Ivaan shared that he had a rough week. Clinician asked open ended questions and He shared that he was experiencing a lot of negative emotions with his wife. Clinician asked open ended questions about his relationship with his wife and the issues. Gurtaj shared that he had quit drinking after his hospitalization but his wife has not.  . Hendryx shared that not only was he had an alcohol abuse problem prior to last June. Client and clinician discussed his symptoms.He shared his parents had issues with alcohol as does his brother, and that his half brother "drank himself to death". Jakevious shared that his Doctor suggest he go to [place]. Client and clinician discussed what AA and [facility] meeting are like.  Clinician asked open need questions and Thorin shared he had not gone because his wife backs out of going. Clinician encouraged him to check out a meeting of his own even if his wife does not attend.Client and clinician discussed the thought emotion connection Client and clinician discussed how to recognize and challenge unhelpful thoughts. Carlester shared that he did not  complete his homework but would before next session. Client and clinician discussed stress reduction techniques.   Plan: Return again in 1- 2 week  Diagnosis:     Axis I: Generalized Anxiety Disorder, Major Depressive Disorder, recurrent, moderate, and Alcohol use disorder in early remission, dependence, and complicated grief    Gloris Shiroma A, LCSW 06/09/2016

## 2016-06-09 NOTE — Patient Instructions (Addendum)
Your physician has requested that you go to the basement for lab work before leaving today.  You were given your last hepatitis A/B injection today.   We have sent the following medications to your pharmacy for you to pick up at your convenience:Lactulose and Nadolol.   Please call our office back after you find out what dose of aldactone you are taking. You should be taking 50 mg.   You have been scheduled for an abdominal ultrasound at [facility] outpatient imaging center  on 06/17/16 at 9:30am. Please arrive 15 minutes prior to your appointment for registration. Make certain not to have anything to eat or drink 6 hours prior to your appointment. Should you need to reschedule your appointment, please contact radiology at [phone]. This test typically takes about 30 minutes to perform.   You have been scheduled for an endoscopy. Please follow written instructions given to you at your visit today. If you use inhalers (even only as needed), please bring them with you on the day of your procedure. Your physician has requested that you go to www.startemmi.com and enter the access code given to you at your visit today. This web site gives a general overview about your procedure. However, you should still follow specific instructions given to you by our office regarding your preparation for the procedure.  Thank you for choosing me and [facility].  Pricilla Riffle. Dagoberto Ligas., MD., Marval Regal

## 2016-06-10 ENCOUNTER — Ambulatory Visit (HOSPITAL_COMMUNITY): Payer: Self-pay | Admitting: Clinical

## 2016-06-10 ENCOUNTER — Telehealth: Payer: Self-pay | Admitting: Gastroenterology

## 2016-06-10 DIAGNOSIS — E349 Endocrine disorder, unspecified: Secondary | ICD-10-CM | POA: Diagnosis not present

## 2016-06-10 DIAGNOSIS — M5136 Other intervertebral disc degeneration, lumbar region: Secondary | ICD-10-CM | POA: Diagnosis not present

## 2016-06-10 DIAGNOSIS — H8113 Benign paroxysmal vertigo, bilateral: Secondary | ICD-10-CM | POA: Diagnosis not present

## 2016-06-10 DIAGNOSIS — N179 Acute kidney failure, unspecified: Secondary | ICD-10-CM | POA: Diagnosis not present

## 2016-06-10 DIAGNOSIS — N183 Chronic kidney disease, stage 3 (moderate): Secondary | ICD-10-CM | POA: Diagnosis not present

## 2016-06-10 DIAGNOSIS — K7460 Unspecified cirrhosis of liver: Secondary | ICD-10-CM | POA: Diagnosis not present

## 2016-06-10 DIAGNOSIS — R69 Illness, unspecified: Secondary | ICD-10-CM | POA: Diagnosis not present

## 2016-06-10 DIAGNOSIS — N182 Chronic kidney disease, stage 2 (mild): Secondary | ICD-10-CM | POA: Diagnosis not present

## 2016-06-10 DIAGNOSIS — I8510 Secondary esophageal varices without bleeding: Secondary | ICD-10-CM | POA: Diagnosis not present

## 2016-06-10 NOTE — Addendum Note (Signed)
Addended by: Marzella Schlein on: 06/10/2016 01:29 PM   Modules accepted: Orders

## 2016-06-10 NOTE — Telephone Encounter (Signed)
Called patient with no answer and no voicemail. 

## 2016-06-11 NOTE — Telephone Encounter (Signed)
Informed patient of lab work results, See lab note from 06/09/16. Also, informed patient of ultrasound scheduled at [facility] out patient imaging on 06/17/16 at 9:30am and to be npo 6 hours prior to study. Patient verbalized understanding.

## 2016-06-12 ENCOUNTER — Telehealth: Payer: Self-pay | Admitting: Gastroenterology

## 2016-06-12 NOTE — Telephone Encounter (Signed)
Called patient with no answer and voicemail has not been set up.

## 2016-06-13 NOTE — Telephone Encounter (Signed)
Called patient with no answer and voicemail has not been set up.

## 2016-06-16 NOTE — Telephone Encounter (Signed)
Patient calling back from last week states he can be reached at [phone].

## 2016-06-16 NOTE — Telephone Encounter (Signed)
Explained to patient that Dr. Fuller Plan still recommends the ultrasound but I can send him this message regarding his high co-pay. Patient states don't worry about it and he will figure it out. Patient states he will just pay the co-pay because he know he needs it.

## 2016-06-17 ENCOUNTER — Inpatient Hospital Stay: Payer: Medicare HMO

## 2016-06-17 ENCOUNTER — Encounter: Payer: Self-pay | Admitting: Oncology

## 2016-06-17 ENCOUNTER — Ambulatory Visit
Admission: RE | Admit: 2016-06-17 | Discharge: 2016-06-17 | Disposition: A | Discharge status: Home / Self Care | Payer: Medicare HMO | Source: Ambulatory Visit | Attending: Gastroenterology | Admitting: Gastroenterology

## 2016-06-17 ENCOUNTER — Inpatient Hospital Stay: Payer: Medicare HMO | Attending: Oncology | Admitting: Oncology

## 2016-06-17 VITALS — BP 117/71 | HR 50 | Temp 97.7°F | Resp 18 | Wt 182.2 lb

## 2016-06-17 DIAGNOSIS — K8020 Calculus of gallbladder without cholecystitis without obstruction: Secondary | ICD-10-CM | POA: Diagnosis not present

## 2016-06-17 DIAGNOSIS — K7030 Alcoholic cirrhosis of liver without ascites: Secondary | ICD-10-CM | POA: Diagnosis not present

## 2016-06-17 DIAGNOSIS — Z86010 Personal history of colonic polyps: Secondary | ICD-10-CM | POA: Diagnosis not present

## 2016-06-17 DIAGNOSIS — E291 Testicular hypofunction: Secondary | ICD-10-CM

## 2016-06-17 DIAGNOSIS — K219 Gastro-esophageal reflux disease without esophagitis: Secondary | ICD-10-CM | POA: Diagnosis not present

## 2016-06-17 DIAGNOSIS — N400 Enlarged prostate without lower urinary tract symptoms: Secondary | ICD-10-CM

## 2016-06-17 DIAGNOSIS — I129 Hypertensive chronic kidney disease with stage 1 through stage 4 chronic kidney disease, or unspecified chronic kidney disease: Secondary | ICD-10-CM

## 2016-06-17 DIAGNOSIS — Z79899 Other long term (current) drug therapy: Secondary | ICD-10-CM | POA: Diagnosis not present

## 2016-06-17 DIAGNOSIS — R778 Other specified abnormalities of plasma proteins: Secondary | ICD-10-CM

## 2016-06-17 DIAGNOSIS — E785 Hyperlipidemia, unspecified: Secondary | ICD-10-CM

## 2016-06-17 DIAGNOSIS — R779 Abnormality of plasma protein, unspecified: Secondary | ICD-10-CM | POA: Diagnosis not present

## 2016-06-17 DIAGNOSIS — G4733 Obstructive sleep apnea (adult) (pediatric): Secondary | ICD-10-CM

## 2016-06-17 DIAGNOSIS — I8500 Esophageal varices without bleeding: Secondary | ICD-10-CM | POA: Diagnosis not present

## 2016-06-17 DIAGNOSIS — K7460 Unspecified cirrhosis of liver: Secondary | ICD-10-CM | POA: Diagnosis not present

## 2016-06-17 DIAGNOSIS — I8501 Esophageal varices with bleeding: Secondary | ICD-10-CM

## 2016-06-17 DIAGNOSIS — N183 Chronic kidney disease, stage 3 (moderate): Secondary | ICD-10-CM | POA: Diagnosis not present

## 2016-06-17 DIAGNOSIS — D696 Thrombocytopenia, unspecified: Secondary | ICD-10-CM | POA: Diagnosis not present

## 2016-06-17 DIAGNOSIS — Z87442 Personal history of urinary calculi: Secondary | ICD-10-CM | POA: Diagnosis not present

## 2016-06-17 DIAGNOSIS — K7031 Alcoholic cirrhosis of liver with ascites: Secondary | ICD-10-CM

## 2016-06-17 DIAGNOSIS — M25569 Pain in unspecified knee: Secondary | ICD-10-CM | POA: Diagnosis not present

## 2016-06-17 DIAGNOSIS — R69 Illness, unspecified: Secondary | ICD-10-CM | POA: Diagnosis not present

## 2016-06-17 LAB — COMPREHENSIVE METABOLIC PANEL
ALK PHOS: 42 U/L (ref 38–126)
ALT: 21 U/L (ref 17–63)
AST: 36 U/L (ref 15–41)
Albumin: 4.2 g/dL (ref 3.5–5.0)
Anion gap: 5 (ref 5–15)
BILIRUBIN TOTAL: 1.3 mg/dL — AB (ref 0.3–1.2)
BUN: 31 mg/dL — AB (ref 6–20)
CHLORIDE: 106 mmol/L (ref 101–111)
CO2: 24 mmol/L (ref 22–32)
CREATININE: 1.39 mg/dL — AB (ref 0.61–1.24)
Calcium: 9.7 mg/dL (ref 8.9–10.3)
GFR calc Af Amer: 59 mL/min — ABNORMAL LOW (ref >60)
GFR, EST NON AFRICAN AMERICAN: 51 mL/min — AB (ref >60)
Glucose, Bld: 117 mg/dL — ABNORMAL HIGH (ref 65–99)
Potassium: 4.3 mmol/L (ref 3.5–5.1)
Sodium: 135 mmol/L (ref 135–145)
Total Protein: 8 g/dL (ref 6.5–8.1)

## 2016-06-17 LAB — CBC WITH DIFFERENTIAL/PLATELET
Basophils Absolute: 0 10*3/uL (ref 0–0.1)
Basophils Relative: 0 %
EOS PCT: 7 %
Eosinophils Absolute: 0.4 10*3/uL (ref 0–0.7)
HEMATOCRIT: 38.5 % — AB (ref 40.0–52.0)
HEMOGLOBIN: 13.6 g/dL (ref 13.0–18.0)
LYMPHS PCT: 31 %
Lymphs Abs: 1.7 10*3/uL (ref 1.0–3.6)
MCH: 33.5 pg (ref 26.0–34.0)
MCHC: 35.3 g/dL (ref 32.0–36.0)
MCV: 94.9 fL (ref 80.0–100.0)
MONOS PCT: 11 %
Monocytes Absolute: 0.6 10*3/uL (ref 0.2–1.0)
Neutro Abs: 2.9 10*3/uL (ref 1.4–6.5)
Neutrophils Relative %: 51 %
PLATELETS: 123 10*3/uL — AB (ref 150–440)
RBC: 4.06 MIL/uL — AB (ref 4.40–5.90)
RDW: 14.4 % (ref 11.5–14.5)
WBC: 5.6 10*3/uL (ref 3.8–10.6)

## 2016-06-17 LAB — LACTATE DEHYDROGENASE: LDH: 127 U/L (ref 98–192)

## 2016-06-17 NOTE — Progress Notes (Signed)
Hematology/Oncology Consult note [facility] Telephone:([phone] [phone]  Patient Care Team: Adin Hector, MD as PCP - General (Internal Medicine)   Name of the patient: Jeff Ward  [id number]  1950-06-22    Reason for referral- abnormal spep   Referring physician- Dr. Holley Raring  Date of visit: 06/17/16   History of presenting illness- patient is a 66 year old male with a history of stage III chronic kidney disease and sees Dr. Holley Raring for the same. He has been referred to [place] for abnormal SPEP. Most recent CBC from 05/14/2016 showed white count of 4.5, H&H of 13/37.8 and a platelet count of 106. SPEP showed M spike of 0.2 g. ANA was negative. CMP showed BUN of 20 and creatinine of 1.1. Serum calcium was within normal limits and LFTs are within normal limits. M spike was not noted on random urine specimen.  Patient deneis any h/o HTN. He is on 3 different diuretics but not sure why or who started it.   ECOG PS- 1  Pain scale- 6- knee pain   Review of systems- Review of Systems  Constitutional: Negative for chills, fever, malaise/fatigue and weight loss.  HENT: Negative for congestion, ear discharge and nosebleeds.   Eyes: Negative for blurred vision.  Respiratory: Negative for cough, hemoptysis, sputum production, shortness of breath and wheezing.   Cardiovascular: Negative for chest pain, palpitations, orthopnea and claudication.  Gastrointestinal: Negative for abdominal pain, blood in stool, constipation, diarrhea, heartburn, melena, nausea and vomiting.  Genitourinary: Negative for dysuria, flank pain, frequency, hematuria and urgency.  Musculoskeletal: Positive for joint pain. Negative for back pain and myalgias.  Skin: Negative for rash.  Neurological: Negative for dizziness, tingling, focal weakness, seizures, weakness and headaches.  Endo/Heme/Allergies: Does not bruise/bleed easily.  Psychiatric/Behavioral: Negative for depression  and suicidal ideas. The patient does not have insomnia.     Allergies  Allergen Reactions  . Bee Venom Anaphylaxis  . Sulfa Antibiotics Other (See Comments)    Reaction:  Fainting     Patient Active Problem List   Diagnosis Date Noted  . SI (sacroiliac) joint inflammation ([place]) 03/31/2016  . Encephalopathy, hepatic ([place]) 03/05/2016  . Esophageal varices without bleeding ([place]) 03/05/2016  . Right foot pain 01/31/2016  . Hyperkalemia 01/31/2016  . Alcoholic cirrhosis of liver without ascites ([place]) 08/06/2015  . Secondary esophageal varices with bleeding ([place])   . Alcoholic cirrhosis ([place]) 29/79/8921  . Liver cirrhosis ([place]) 06/19/2015  . GERD (gastroesophageal reflux disease) 03/22/2015  . Personal history of colonic polyps 04/27/2013  . Depression 02/17/2013  . Hyperglycemia 07/23/2011  . OSA (obstructive sleep apnea) 04/10/2011  . Testosterone deficiency 03/06/2011  . Degenerative disc disease, lumbar 05/09/2010  . OTHER NONSPECIFIC ABNORMAL SERUM ENZYME LEVELS 05/23/2009  . Essential hypertension, benign 09/01/2007  . COLONIC POLYPS 10/23/2006  . HLD (hyperlipidemia) 10/23/2006  . PUD 10/23/2006  . 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 CARPAL TUNNEL SYNDROME, BILATERAL, HX OF 10/23/2006  . BENIGN PROSTATIC HYPERTROPHY, HX OF 10/23/2006     Past Medical History:  Diagnosis Date  . Arthritis    In back and all joints  . Chronic headache    now resolved  . CKD (chronic kidney disease)   . 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 Depression   . Esophageal varices ([place])   . GERD (gastroesophageal reflux disease)   . GI bleed   . Hiatal hernia   . History of kidney stones 1970's  . Hyperlipidemia 06/2002  .  Hypertension   . Psoriasis      Past Surgical History:  Procedure Laterality Date  . CARPAL TUNNEL RELEASE  2005    / right hand  . ESOPHAGOGASTRODUODENOSCOPY N/A 07/24/2015   Procedure: ESOPHAGOGASTRODUODENOSCOPY (EGD);  Surgeon:  Lucilla Lame, MD;  Location: [facility] ENDOSCOPY;  Service: Endoscopy;  Laterality: N/A;  Banding  . ESOPHAGOGASTRODUODENOSCOPY (EGD) WITH PROPOFOL N/A 08/14/2015   Procedure: ESOPHAGOGASTRODUODENOSCOPY (EGD) WITH PROPOFOL;  Surgeon: Ladene Artist, MD;  Location: [facility] ENDOSCOPY;  Service: Endoscopy;  Laterality: N/A;  . TONSILLECTOMY AND ADENOIDECTOMY     66 years old  . VASECTOMY      Social History   Social History  . Marital status: Married    Spouse name: N/A  . Number of children: 2  . Years of education: N/A   Occupational History  . retired    Social History Main Topics  . Smoking status: Former Smoker    Years: 35.00    Types: Cigars    Quit date: 04/02/2010  . Smokeless tobacco: Never Used     Comment: Quit 4 years ago  . 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 Drug use: No  . Sexual activity: Yes    Partners: Female     Comment: declined condoms   Other Topics Concern  . Not on file   Social History Narrative   Independent at baseline. Has a cane that he uses occasionally. Lives at home with his wife.     Family History  Problem Relation Age of Onset  . CAD Mother   . Melanoma Father   . CVA Father   . Breast cancer Paternal Aunt   . Alcohol abuse Maternal Grandfather      Current Outpatient Prescriptions:  .  fenofibrate (TRICOR) 145 MG tablet, Take 1 tablet (145 mg total) by mouth every morning., Disp: 90 tablet, Rfl: 1 .  ferrous sulfate 325 (65 FE) MG tablet, Take 1 tablet (325 mg total) by mouth 3 (three) times daily with meals., Disp: 90 tablet, Rfl: 5 .  folic acid (FOLVITE) 1 MG tablet, Take 1 tablet (1 mg total) by mouth daily., Disp: 90 tablet, Rfl: 1 .  furosemide (LASIX) 20 MG tablet, Take 1 tablet (20 mg total) by mouth daily., Disp: 90 tablet, Rfl: 2 .  lactulose (CHRONULAC) 10 GM/15ML solution, Take 19.5 mLs (13 g total) by mouth 2 (two) times daily., Disp: 1892 mL, Rfl: 5 .  nadolol (CORGARD) 20 MG  tablet, TAKE ONE TABLET BY MOUTH ONCE DAILY., Disp: 90 tablet, Rfl: 3 .  pantoprazole (PROTONIX) 40 MG tablet, Take 1 tablet (40 mg total) by mouth daily., Disp: 90 tablet, Rfl: 2 .  potassium chloride SA (K-DUR,KLOR-CON) 20 MEQ tablet, TAKE 1 TABLET BY MOUTH TWO  TIMES DAILY, Disp: 180 tablet, Rfl: 1 .  rifaximin (XIFAXAN) 550 MG TABS tablet, Take 1 tablet (550 mg total) by mouth 2 (two) times daily., Disp: 180 tablet, Rfl: 3 .  spironolactone (ALDACTONE) 50 MG tablet, Take 2 tablets (100 mg total) by mouth daily., Disp: 60 tablet, Rfl: 0 .  thiamine 100 MG tablet, Take 1 tablet (100 mg total) by mouth 2 (two) times daily., Disp: 60 tablet, Rfl: 0 .  EPINEPHrine (EPIPEN 2-PAK) 0.3 mg/0.3 mL IJ SOAJ injection, Inject 0.3 mg into the muscle once as needed (for severe allergic reaction)., Disp: , Rfl:  .  mometasone (ELOCON) 0.1 % lotion, Place  4 drops into both ears 2 (two) times daily. For dry skin, Disp: , Rfl: 0 .  tetrahydrozoline 0.05 % ophthalmic solution, Place 1 drop into both eyes daily., Disp: , Rfl:  .  TWINRIX 720-20 injection, Inject 1 mL into the muscle See admin instructions. 3 doses : 2nd dose given 1 month from 1st dose and 3rd dose given 6 months from 1st dose, Disp: , Rfl:    Physical exam:  Vitals:   06/17/16 1100  BP: 117/71  Pulse: (!) 50  Resp: 18  Temp: 97.7 F (36.5 C)  TempSrc: Tympanic  SpO2: 99%  Weight: 182 lb 3.2 oz (82.6 kg)   Physical Exam  Constitutional: He is oriented to person, place, and time and well-developed, well-nourished, and in no distress.  HENT:  Head: Normocephalic and atraumatic.  Eyes: EOM are normal. Pupils are equal, round, and reactive to light.  Neck: Normal range of motion.  Cardiovascular: Normal rate, regular rhythm and normal heart sounds.   Pulmonary/Chest: Effort normal and breath sounds normal.  Abdominal: Soft. Bowel sounds are normal.  Neurological: He is alert and oriented to person, place, and time.  Skin: Skin is warm  and dry.       CMP Latest Ref Rng & Units 06/09/2016  Glucose 70 - 99 mg/dL 120(H)  BUN 6 - 23 mg/dL 35(H)  Creatinine 0.40 - 1.50 mg/dL 1.55(H)  Sodium 135 - 145 mEq/L 134(L)  Potassium 3.5 - 5.1 mEq/L 4.2  Chloride 96 - 112 mEq/L 103  CO2 19 - 32 mEq/L 24  Calcium 8.4 - 10.5 mg/dL 10.0  Total Protein 6.0 - 8.3 g/dL 8.0  Total Bilirubin 0.2 - 1.2 mg/dL 1.0  Alkaline Phos 39 - 117 U/L 45  AST 0 - 37 U/L 30  ALT 0 - 53 U/L 19   CBC Latest Ref Rng & Units 06/09/2016  WBC 4.0 - 10.5 K/uL 7.2  Hemoglobin 13.0 - 17.0 g/dL 13.6  Hematocrit 39.0 - 52.0 % 39.7  Platelets 150.0 - 400.0 K/uL 130.0(L)    No images are attached to the encounter.  US Abdomen Limited Ruq  Result Date: 06/17/2016 CLINICAL DATA:  Cirrhosis, HCC screening EXAM: US ABDOMEN LIMITED - RIGHT UPPER QUADRANT COMPARISON:  09/20/2014 CT FINDINGS: Gallbladder: Gallstones, the largest 9 mm. Mild wall thickening at 6 mm, likely related to liver disease. No sonographic Murphy sign. Common bile duct: Diameter: Normal caliber, 4 mm. Liver: Heterogeneous echotexture with nodular contours compatible with cirrhosis. No focal hepatic abnormality. IMPRESSION: Changes of cirrhosis.  No focal hepatic abnormality. Cholelithiasis. Gallbladder wall thickening is likely related to liver disease. Electronically Signed   By: Rolm Baptise M.D.   On: 06/17/2016 10:10    Assessment and plan- Patient is a 66 y.o. male with stage III CKD referred to [place] for abnormal spep  Explain what is spep and spectrum from MGUS to multiple myeloma. This could be MGUS as M spike was only 0.8 gm. Patient does not have anemia or hypercalcemia. Today I will obtain cbc with diff, cmp, kappa lambda free light chains, ldh, myeloma panel and 24 hour UPEP and urine IFE. Also get skeletal survery. I will see him back in 2 weeks time to discuss results of blodowork  Mild thrombocytopenia- continue to monitor   Thank you for this kind referral and the opportunity to  participate in the care of this patient   Visit Diagnosis 1. Abnormal SPEP     Dr. Randa Evens, MD, MPH [place] [facility]  [facility] Pager- [phone] 06/17/2016

## 2016-06-18 DIAGNOSIS — D472 Monoclonal gammopathy: Secondary | ICD-10-CM | POA: Insufficient documentation

## 2016-06-18 LAB — KAPPA/LAMBDA LIGHT CHAINS
Kappa free light chain: 56.9 mg/L — ABNORMAL HIGH (ref 3.3–19.4)
Kappa, lambda light chain ratio: 1.76 — ABNORMAL HIGH (ref 0.26–1.65)
Lambda free light chains: 32.3 mg/L — ABNORMAL HIGH (ref 5.7–26.3)

## 2016-06-19 ENCOUNTER — Ambulatory Visit
Admission: RE | Admit: 2016-06-19 | Discharge: 2016-06-19 | Disposition: A | Discharge status: Home / Self Care | Payer: Medicare HMO | Source: Ambulatory Visit | Attending: Oncology | Admitting: Oncology

## 2016-06-19 DIAGNOSIS — M25569 Pain in unspecified knee: Secondary | ICD-10-CM | POA: Diagnosis not present

## 2016-06-19 DIAGNOSIS — E785 Hyperlipidemia, unspecified: Secondary | ICD-10-CM | POA: Diagnosis not present

## 2016-06-19 DIAGNOSIS — R69 Illness, unspecified: Secondary | ICD-10-CM | POA: Diagnosis not present

## 2016-06-19 DIAGNOSIS — R778 Other specified abnormalities of plasma proteins: Secondary | ICD-10-CM | POA: Diagnosis not present

## 2016-06-19 DIAGNOSIS — I129 Hypertensive chronic kidney disease with stage 1 through stage 4 chronic kidney disease, or unspecified chronic kidney disease: Secondary | ICD-10-CM | POA: Diagnosis not present

## 2016-06-19 DIAGNOSIS — I700 Atherosclerosis of aorta: Secondary | ICD-10-CM | POA: Insufficient documentation

## 2016-06-19 DIAGNOSIS — E291 Testicular hypofunction: Secondary | ICD-10-CM | POA: Diagnosis not present

## 2016-06-19 DIAGNOSIS — K219 Gastro-esophageal reflux disease without esophagitis: Secondary | ICD-10-CM | POA: Diagnosis not present

## 2016-06-19 DIAGNOSIS — D696 Thrombocytopenia, unspecified: Secondary | ICD-10-CM | POA: Diagnosis not present

## 2016-06-19 DIAGNOSIS — N183 Chronic kidney disease, stage 3 (moderate): Secondary | ICD-10-CM | POA: Diagnosis not present

## 2016-06-19 DIAGNOSIS — R779 Abnormality of plasma protein, unspecified: Secondary | ICD-10-CM | POA: Diagnosis not present

## 2016-06-19 DIAGNOSIS — K7030 Alcoholic cirrhosis of liver without ascites: Secondary | ICD-10-CM | POA: Diagnosis not present

## 2016-06-19 DIAGNOSIS — N400 Enlarged prostate without lower urinary tract symptoms: Secondary | ICD-10-CM | POA: Diagnosis not present

## 2016-06-19 LAB — MULTIPLE MYELOMA PANEL, SERUM
ALBUMIN SERPL ELPH-MCNC: 3.8 g/dL (ref 2.9–4.4)
Albumin/Glob SerPl: 1.1 (ref 0.7–1.7)
Alpha 1: 0.3 g/dL (ref 0.0–0.4)
Alpha2 Glob SerPl Elph-Mcnc: 0.6 g/dL (ref 0.4–1.0)
B-Globulin SerPl Elph-Mcnc: 1.1 g/dL (ref 0.7–1.3)
GAMMA GLOB SERPL ELPH-MCNC: 1.7 g/dL (ref 0.4–1.8)
Globulin, Total: 3.7 g/dL (ref 2.2–3.9)
IgA: 271 mg/dL (ref 61–437)
IgG (Immunoglobin G), Serum: 1594 mg/dL (ref 700–1600)
IgM, Serum: 125 mg/dL (ref 20–172)
M Protein SerPl Elph-Mcnc: 0.9 g/dL — ABNORMAL HIGH
Total Protein ELP: 7.5 g/dL (ref 6.0–8.5)

## 2016-06-20 ENCOUNTER — Other Ambulatory Visit: Payer: Self-pay | Admitting: *Deleted

## 2016-06-20 DIAGNOSIS — R778 Other specified abnormalities of plasma proteins: Secondary | ICD-10-CM

## 2016-06-24 LAB — UIFE/LIGHT CHAINS/TP QN, 24-HR UR
% BETA, Urine: 0 %
ALPHA 1 URINE: 0 %
ALPHA 2 UR: 0 %
Albumin, U: 100 %
FREE KAPPA/LAMBDA RATIO: 28.95 — AB (ref 2.04–10.37)
FREE LT CHN EXCR RATE: 5.79 mg/L (ref 1.35–24.19)
Free Lambda Lt Chains,Ur: 0.2 mg/L — ABNORMAL LOW (ref 0.24–6.66)
GAMMA GLOBULIN URINE: 0 %
TOTAL PROTEIN, URINE-UR/DAY: 256 mg/(24.h) — AB (ref 30–150)
Total Protein, Urine: 14.2 mg/dL
Total Volume: 1800

## 2016-07-01 ENCOUNTER — Inpatient Hospital Stay: Payer: Medicare HMO | Admitting: Oncology

## 2016-07-03 ENCOUNTER — Ambulatory Visit (HOSPITAL_COMMUNITY): Payer: Self-pay | Admitting: Clinical

## 2016-07-08 ENCOUNTER — Ambulatory Visit: Payer: Medicare HMO | Admitting: Oncology

## 2016-07-16 ENCOUNTER — Ambulatory Visit (HOSPITAL_COMMUNITY): Payer: Self-pay | Admitting: Clinical

## 2016-07-16 ENCOUNTER — Other Ambulatory Visit: Payer: Self-pay | Admitting: Family Medicine

## 2016-07-21 ENCOUNTER — Other Ambulatory Visit: Payer: Self-pay

## 2016-07-21 DIAGNOSIS — K7031 Alcoholic cirrhosis of liver with ascites: Secondary | ICD-10-CM

## 2016-07-22 ENCOUNTER — Other Ambulatory Visit (INDEPENDENT_AMBULATORY_CARE_PROVIDER_SITE_OTHER): Payer: Medicare HMO

## 2016-07-22 ENCOUNTER — Encounter: Payer: Self-pay | Admitting: Gastroenterology

## 2016-07-22 DIAGNOSIS — R69 Illness, unspecified: Secondary | ICD-10-CM | POA: Diagnosis not present

## 2016-07-22 DIAGNOSIS — K7031 Alcoholic cirrhosis of liver with ascites: Secondary | ICD-10-CM

## 2016-07-22 LAB — BASIC METABOLIC PANEL
BUN: 32 mg/dL — ABNORMAL HIGH (ref 6–23)
CHLORIDE: 106 meq/L (ref 96–112)
CO2: 23 mEq/L (ref 19–32)
Calcium: 10.6 mg/dL — ABNORMAL HIGH (ref 8.4–10.5)
Creatinine, Ser: 1.59 mg/dL — ABNORMAL HIGH (ref 0.40–1.50)
GFR: 46.4 mL/min — ABNORMAL LOW (ref >60.00)
Glucose, Bld: 101 mg/dL — ABNORMAL HIGH (ref 70–99)
POTASSIUM: 4.4 meq/L (ref 3.5–5.1)
Sodium: 137 mEq/L (ref 135–145)

## 2016-07-23 ENCOUNTER — Other Ambulatory Visit: Payer: Self-pay

## 2016-07-23 ENCOUNTER — Encounter: Payer: Self-pay | Admitting: Nephrology

## 2016-07-23 DIAGNOSIS — K7460 Unspecified cirrhosis of liver: Secondary | ICD-10-CM

## 2016-07-23 MED ORDER — SPIRONOLACTONE 50 MG PO TABS: 50.0000 mg | ORAL_TABLET | Freq: Every day | ORAL | 0 refills | Status: DC

## 2016-07-28 DIAGNOSIS — M5136 Other intervertebral disc degeneration, lumbar region: Secondary | ICD-10-CM | POA: Diagnosis not present

## 2016-07-28 DIAGNOSIS — I10 Essential (primary) hypertension: Secondary | ICD-10-CM | POA: Diagnosis not present

## 2016-07-28 DIAGNOSIS — D472 Monoclonal gammopathy: Secondary | ICD-10-CM | POA: Diagnosis not present

## 2016-07-28 DIAGNOSIS — J9811 Atelectasis: Secondary | ICD-10-CM | POA: Diagnosis not present

## 2016-07-28 DIAGNOSIS — R69 Illness, unspecified: Secondary | ICD-10-CM | POA: Diagnosis not present

## 2016-07-28 DIAGNOSIS — N183 Chronic kidney disease, stage 3 (moderate): Secondary | ICD-10-CM | POA: Diagnosis not present

## 2016-07-28 DIAGNOSIS — R0600 Dyspnea, unspecified: Secondary | ICD-10-CM | POA: Diagnosis not present

## 2016-07-28 DIAGNOSIS — E784 Other hyperlipidemia: Secondary | ICD-10-CM | POA: Diagnosis not present

## 2016-07-31 ENCOUNTER — Ambulatory Visit (HOSPITAL_COMMUNITY): Payer: Self-pay | Admitting: Clinical

## 2016-08-01 ENCOUNTER — Telehealth: Payer: Self-pay | Admitting: Gastroenterology

## 2016-08-01 NOTE — Telephone Encounter (Signed)
Patient notified that we are checking a BMET. Liver tests were done in May

## 2016-08-05 ENCOUNTER — Ambulatory Visit (AMBULATORY_SURGERY_CENTER): Payer: Medicare HMO | Admitting: Gastroenterology

## 2016-08-05 ENCOUNTER — Other Ambulatory Visit (INDEPENDENT_AMBULATORY_CARE_PROVIDER_SITE_OTHER): Payer: Medicare HMO

## 2016-08-05 ENCOUNTER — Encounter: Payer: Self-pay | Admitting: Gastroenterology

## 2016-08-05 VITALS — BP 100/57 | HR 54 | Temp 98.6°F | Resp 14 | Ht 65.0 in | Wt 181.0 lb

## 2016-08-05 DIAGNOSIS — K7030 Alcoholic cirrhosis of liver without ascites: Secondary | ICD-10-CM | POA: Diagnosis not present

## 2016-08-05 DIAGNOSIS — I8510 Secondary esophageal varices without bleeding: Secondary | ICD-10-CM | POA: Diagnosis not present

## 2016-08-05 DIAGNOSIS — K219 Gastro-esophageal reflux disease without esophagitis: Secondary | ICD-10-CM | POA: Diagnosis not present

## 2016-08-05 DIAGNOSIS — I10 Essential (primary) hypertension: Secondary | ICD-10-CM | POA: Diagnosis not present

## 2016-08-05 DIAGNOSIS — K7460 Unspecified cirrhosis of liver: Secondary | ICD-10-CM | POA: Diagnosis not present

## 2016-08-05 DIAGNOSIS — R188 Other ascites: Secondary | ICD-10-CM | POA: Diagnosis not present

## 2016-08-05 DIAGNOSIS — G4733 Obstructive sleep apnea (adult) (pediatric): Secondary | ICD-10-CM | POA: Diagnosis not present

## 2016-08-05 DIAGNOSIS — R69 Illness, unspecified: Secondary | ICD-10-CM | POA: Diagnosis not present

## 2016-08-05 LAB — BASIC METABOLIC PANEL
BUN: 20 mg/dL (ref 6–23)
CALCIUM: 9.9 mg/dL (ref 8.4–10.5)
CO2: 23 mEq/L (ref 19–32)
CREATININE: 1.3 mg/dL (ref 0.40–1.50)
Chloride: 108 mEq/L (ref 96–112)
GFR: 58.53 mL/min — ABNORMAL LOW (ref >60.00)
Glucose, Bld: 96 mg/dL (ref 70–99)
Potassium: 4.5 mEq/L (ref 3.5–5.1)
SODIUM: 138 meq/L (ref 135–145)

## 2016-08-05 MED ORDER — SODIUM CHLORIDE 0.9 % IV SOLN: 500.0000 mL | INTRAVENOUS | Status: DC

## 2016-08-05 NOTE — Patient Instructions (Signed)
Discharge instructions given. Normal exam. Resume previous medications. YOU HAD AN ENDOSCOPIC PROCEDURE TODAY AT THE [facility]:   Refer to the procedure report that was given to you for any specific questions about what was found during the examination.  If the procedure report does not answer your questions, please call your gastroenterologist to clarify.  If you requested that your care partner not be given the details of your procedure findings, then the procedure report has been included in a sealed envelope for you to review at your convenience later.  YOU SHOULD EXPECT: Some feelings of bloating in the abdomen. Passage of more gas than usual.  Walking can help get rid of the air that was put into your GI tract during the procedure and reduce the bloating. If you had a lower endoscopy (such as a colonoscopy or flexible sigmoidoscopy) you may notice spotting of blood in your stool or on the toilet paper. If you underwent a bowel prep for your procedure, you may not have a normal bowel movement for a few days.  Please Note:  You might notice some irritation and congestion in your nose or some drainage.  This is from the oxygen used during your procedure.  There is no need for concern and it should clear up in a day or so.  SYMPTOMS TO REPORT IMMEDIATELY:   Following upper endoscopy (EGD)  Vomiting of blood or coffee ground material  New chest pain or pain under the shoulder blades  Painful or persistently difficult swallowing  New shortness of breath  Fever of 100F or higher  Black, tarry-looking stools  For urgent or emergent issues, a gastroenterologist can be reached at any hour by calling [phone].   DIET:  We do recommend a small meal at first, but then you may proceed to your regular diet.  Drink plenty of fluids but you should avoid alcoholic beverages for 24 hours.  ACTIVITY:  You should plan to take it easy for the rest of today and you should NOT DRIVE or  use heavy machinery until tomorrow (because of the sedation medicines used during the test).    FOLLOW UP: Our staff will call the number listed on your records the next business day following your procedure to check on you and address any questions or concerns that you may have regarding the information given to you following your procedure. If we do not reach you, we will leave a message.  However, if you are feeling well and you are not experiencing any problems, there is no need to return our call.  We will assume that you have returned to your regular daily activities without incident.  If any biopsies were taken you will be contacted by phone or by letter within the next 1-3 weeks.  Please call us at [phone] if you have not heard about the biopsies in 3 weeks.    SIGNATURES/CONFIDENTIALITY: You and/or your care partner have signed paperwork which will be entered into your electronic medical record.  These signatures attest to the fact that that the information above on your After Visit Summary has been reviewed and is understood.  Full responsibility of the confidentiality of this discharge information lies with you and/or your care-partner. 

## 2016-08-05 NOTE — Op Note (Signed)
[facility] Patient Name: Jeff Ward Procedure Date: 08/05/2016 1:57 PM MRN: [id number] Endoscopist: Ladene Artist , MD Age: 66 Referring MD:  Date of Birth: 1950/08/30 Gender: Male Account #: [id number] Procedure:                Upper GI endoscopy Indications:              Surveillance procedure, Follow-up of esophageal                            varices Medicines:                Monitored Anesthesia Care Procedure:                Pre-Anesthesia Assessment:                           - Prior to the procedure, a History and Physical                            was performed, and patient medications and                            allergies were reviewed. The patient's tolerance of                            previous anesthesia was also reviewed. The risks                            and benefits of the procedure and the sedation                            options and risks were discussed with the patient.                            All questions were answered, and informed consent                            was obtained. Prior Anticoagulants: The patient has                            taken no previous anticoagulant or antiplatelet                            agents. ASA Grade Assessment: III - A patient with                            severe systemic disease. After reviewing the risks                            and benefits, the patient was deemed in                            satisfactory condition to undergo the procedure.  After obtaining informed consent, the endoscope was                            passed under direct vision. Throughout the                            procedure, the patient's blood pressure, pulse, and                            oxygen saturations were monitored continuously. The                            Endoscope was introduced through the mouth, and                            advanced to the second part of duodenum. The upper                             GI endoscopy was accomplished without difficulty.                            The patient tolerated the procedure well. Scope In: Scope Out: Findings:                 Two columns of non-bleeding grade I varices were                            found in the distal esophagus,. They were 5 mm in                            largest diameter. No stigmata of recent bleeding                            were evident and no red wale signs were present.                            Scarring from prior treatment was visible.                           The exam of the esophagus was otherwise normal.                           Moderate portal hypertensive gastropathy was found                            in the entire examined stomach.                           The exam of the stomach was otherwise normal.                           The duodenal bulb and second portion of the  duodenum were normal. Complications:            No immediate complications. Estimated Blood Loss:     Estimated blood loss: none. Impression:               - Non-bleeding grade I esophageal varices and                            scarring from prior treatment.                           - Portal hypertensive gastropathy.                           - Normal duodenal bulb and second portion of the                            duodenum.                           - No specimens collected. Recommendation:           - Patient has a contact number available for                            emergencies. The signs and symptoms of potential                            delayed complications were discussed with the                            patient. Return to normal activities tomorrow.                            Written discharge instructions were provided to the                            patient.                           - Resume previous diet.                           - Continue present  medications.                           - Repeat upper endoscopy in 1 year for surveillance. Ladene Artist, MD 08/05/2016 2:13:18 PM This report has been signed electronically.

## 2016-08-05 NOTE — Progress Notes (Signed)
Report to PACU, RN, vss, BBS= Clear.  

## 2016-08-06 ENCOUNTER — Telehealth: Payer: Self-pay | Admitting: *Deleted

## 2016-08-06 NOTE — Telephone Encounter (Signed)
  Follow up Call-  Call back number 08/05/2016 10/11/2014  Post procedure Call Back phone  # [phone] or [phone] (leave a message at this #)  Permission to leave phone message No Yes  Some recent data might be hidden    Spoke with wife Patient questions:  Do you have a fever, pain , or abdominal swelling? No. Pain Score  0 *  Have you tolerated food without any problems? Yes.    Have you been able to return to your normal activities? Yes.    Do you have any questions about your discharge instructions: Diet   No. Medications  No. Follow up visit  No.  Do you have questions or concerns about your Care? No.  Actions: * If pain score is 4 or above: No action needed, pain <4.

## 2016-08-06 NOTE — Telephone Encounter (Signed)
  Follow up Call-  Call back number 08/05/2016 10/11/2014  Post procedure Call Back phone  # [phone] or [phone] (leave a message at this #)  Permission to leave phone message No Yes  Some recent data might be hidden     Patient questions:  Do you have a fever, pain , or abdominal swelling? No. Pain Score  0 *  Have you tolerated food without any problems? Yes.    Have you been able to return to your normal activities? Yes.    Do you have any questions about your discharge instructions: Diet   No. Medications  No. Follow up visit  No.  Do you have questions or concerns about your Care? No.  Actions: * If pain score is 4 or above: No action needed, pain <4.

## 2016-08-13 ENCOUNTER — Ambulatory Visit (HOSPITAL_COMMUNITY): Payer: Self-pay | Admitting: Clinical

## 2016-08-20 DIAGNOSIS — R69 Illness, unspecified: Secondary | ICD-10-CM | POA: Diagnosis not present

## 2016-08-27 ENCOUNTER — Ambulatory Visit (HOSPITAL_COMMUNITY): Payer: Self-pay | Admitting: Clinical

## 2016-10-27 ENCOUNTER — Telehealth: Payer: Self-pay | Admitting: Gastroenterology

## 2016-10-27 DIAGNOSIS — I8501 Esophageal varices with bleeding: Secondary | ICD-10-CM

## 2016-10-27 DIAGNOSIS — K7030 Alcoholic cirrhosis of liver without ascites: Secondary | ICD-10-CM

## 2016-10-27 MED ORDER — NADOLOL 20 MG PO TABS: ORAL_TABLET | ORAL | 0 refills | Status: DC

## 2016-10-27 NOTE — Telephone Encounter (Signed)
Prescription sent to patient's pharmacy.

## 2016-11-05 DIAGNOSIS — R69 Illness, unspecified: Secondary | ICD-10-CM | POA: Diagnosis not present

## 2016-11-05 DIAGNOSIS — E538 Deficiency of other specified B group vitamins: Secondary | ICD-10-CM | POA: Diagnosis not present

## 2016-11-05 DIAGNOSIS — R0683 Snoring: Secondary | ICD-10-CM | POA: Diagnosis not present

## 2016-11-26 DIAGNOSIS — E538 Deficiency of other specified B group vitamins: Secondary | ICD-10-CM | POA: Diagnosis not present

## 2016-11-26 DIAGNOSIS — R69 Illness, unspecified: Secondary | ICD-10-CM | POA: Diagnosis not present

## 2016-11-26 DIAGNOSIS — D472 Monoclonal gammopathy: Secondary | ICD-10-CM | POA: Diagnosis not present

## 2016-11-26 DIAGNOSIS — I10 Essential (primary) hypertension: Secondary | ICD-10-CM | POA: Diagnosis not present

## 2016-11-26 DIAGNOSIS — E7849 Other hyperlipidemia: Secondary | ICD-10-CM | POA: Diagnosis not present

## 2016-12-02 ENCOUNTER — Ambulatory Visit: Payer: Medicare HMO | Attending: Neurology

## 2016-12-02 DIAGNOSIS — M159 Polyosteoarthritis, unspecified: Secondary | ICD-10-CM | POA: Diagnosis not present

## 2016-12-02 DIAGNOSIS — G4733 Obstructive sleep apnea (adult) (pediatric): Secondary | ICD-10-CM | POA: Insufficient documentation

## 2016-12-02 DIAGNOSIS — M5136 Other intervertebral disc degeneration, lumbar region: Secondary | ICD-10-CM | POA: Diagnosis not present

## 2016-12-02 DIAGNOSIS — E349 Endocrine disorder, unspecified: Secondary | ICD-10-CM | POA: Diagnosis not present

## 2016-12-02 DIAGNOSIS — D472 Monoclonal gammopathy: Secondary | ICD-10-CM | POA: Diagnosis not present

## 2016-12-02 DIAGNOSIS — I8510 Secondary esophageal varices without bleeding: Secondary | ICD-10-CM | POA: Diagnosis not present

## 2016-12-02 DIAGNOSIS — N183 Chronic kidney disease, stage 3 (moderate): Secondary | ICD-10-CM | POA: Diagnosis not present

## 2016-12-02 DIAGNOSIS — E7849 Other hyperlipidemia: Secondary | ICD-10-CM | POA: Diagnosis not present

## 2016-12-02 DIAGNOSIS — G4730 Sleep apnea, unspecified: Secondary | ICD-10-CM | POA: Diagnosis not present

## 2016-12-02 DIAGNOSIS — R0683 Snoring: Secondary | ICD-10-CM | POA: Insufficient documentation

## 2016-12-02 DIAGNOSIS — R69 Illness, unspecified: Secondary | ICD-10-CM | POA: Diagnosis not present

## 2016-12-02 DIAGNOSIS — I10 Essential (primary) hypertension: Secondary | ICD-10-CM | POA: Diagnosis not present

## 2016-12-03 ENCOUNTER — Other Ambulatory Visit: Payer: Self-pay | Admitting: Internal Medicine

## 2016-12-03 DIAGNOSIS — I10 Essential (primary) hypertension: Secondary | ICD-10-CM

## 2016-12-03 DIAGNOSIS — K7030 Alcoholic cirrhosis of liver without ascites: Secondary | ICD-10-CM

## 2016-12-08 ENCOUNTER — Ambulatory Visit
Admission: RE | Admit: 2016-12-08 | Discharge: 2016-12-08 | Disposition: A | Discharge status: Home / Self Care | Payer: Medicare HMO | Source: Ambulatory Visit | Attending: Internal Medicine | Admitting: Internal Medicine

## 2016-12-08 DIAGNOSIS — K769 Liver disease, unspecified: Secondary | ICD-10-CM | POA: Insufficient documentation

## 2016-12-08 DIAGNOSIS — K7030 Alcoholic cirrhosis of liver without ascites: Secondary | ICD-10-CM

## 2016-12-08 DIAGNOSIS — R161 Splenomegaly, not elsewhere classified: Secondary | ICD-10-CM | POA: Diagnosis not present

## 2016-12-08 DIAGNOSIS — I10 Essential (primary) hypertension: Secondary | ICD-10-CM

## 2016-12-08 DIAGNOSIS — K8020 Calculus of gallbladder without cholecystitis without obstruction: Secondary | ICD-10-CM | POA: Insufficient documentation

## 2016-12-16 ENCOUNTER — Encounter: Payer: Self-pay | Admitting: Gastroenterology

## 2016-12-29 DIAGNOSIS — R69 Illness, unspecified: Secondary | ICD-10-CM | POA: Diagnosis not present

## 2016-12-29 DIAGNOSIS — D472 Monoclonal gammopathy: Secondary | ICD-10-CM | POA: Diagnosis not present

## 2017-01-02 DIAGNOSIS — D472 Monoclonal gammopathy: Secondary | ICD-10-CM | POA: Diagnosis not present

## 2017-01-02 DIAGNOSIS — I10 Essential (primary) hypertension: Secondary | ICD-10-CM | POA: Diagnosis not present

## 2017-01-02 DIAGNOSIS — E7849 Other hyperlipidemia: Secondary | ICD-10-CM | POA: Diagnosis not present

## 2017-01-02 DIAGNOSIS — I8510 Secondary esophageal varices without bleeding: Secondary | ICD-10-CM | POA: Diagnosis not present

## 2017-01-02 DIAGNOSIS — N183 Chronic kidney disease, stage 3 (moderate): Secondary | ICD-10-CM | POA: Diagnosis not present

## 2017-01-02 DIAGNOSIS — R739 Hyperglycemia, unspecified: Secondary | ICD-10-CM | POA: Diagnosis not present

## 2017-01-02 DIAGNOSIS — M5136 Other intervertebral disc degeneration, lumbar region: Secondary | ICD-10-CM | POA: Diagnosis not present

## 2017-01-02 DIAGNOSIS — R69 Illness, unspecified: Secondary | ICD-10-CM | POA: Diagnosis not present

## 2017-01-02 DIAGNOSIS — Z23 Encounter for immunization: Secondary | ICD-10-CM | POA: Diagnosis not present

## 2017-01-02 DIAGNOSIS — E349 Endocrine disorder, unspecified: Secondary | ICD-10-CM | POA: Diagnosis not present

## 2017-01-28 ENCOUNTER — Other Ambulatory Visit
Admission: RE | Admit: 2017-01-28 | Discharge: 2017-01-28 | Disposition: A | Discharge status: Home / Self Care | Payer: Medicare HMO | Source: Other Acute Inpatient Hospital | Attending: Internal Medicine | Admitting: Internal Medicine

## 2017-01-28 DIAGNOSIS — E7849 Other hyperlipidemia: Secondary | ICD-10-CM | POA: Diagnosis not present

## 2017-01-28 DIAGNOSIS — D472 Monoclonal gammopathy: Secondary | ICD-10-CM | POA: Insufficient documentation

## 2017-01-28 DIAGNOSIS — R739 Hyperglycemia, unspecified: Secondary | ICD-10-CM | POA: Diagnosis not present

## 2017-01-28 DIAGNOSIS — I10 Essential (primary) hypertension: Secondary | ICD-10-CM | POA: Diagnosis not present

## 2017-01-28 LAB — AMMONIA: AMMONIA: 65 umol/L — AB (ref 9–35)

## 2017-02-02 DIAGNOSIS — I8510 Secondary esophageal varices without bleeding: Secondary | ICD-10-CM | POA: Diagnosis not present

## 2017-02-02 DIAGNOSIS — R69 Illness, unspecified: Secondary | ICD-10-CM | POA: Diagnosis not present

## 2017-02-02 DIAGNOSIS — N183 Chronic kidney disease, stage 3 (moderate): Secondary | ICD-10-CM | POA: Diagnosis not present

## 2017-02-02 DIAGNOSIS — E7849 Other hyperlipidemia: Secondary | ICD-10-CM | POA: Diagnosis not present

## 2017-02-02 DIAGNOSIS — E349 Endocrine disorder, unspecified: Secondary | ICD-10-CM | POA: Diagnosis not present

## 2017-02-02 DIAGNOSIS — R739 Hyperglycemia, unspecified: Secondary | ICD-10-CM | POA: Diagnosis not present

## 2017-02-02 DIAGNOSIS — H8113 Benign paroxysmal vertigo, bilateral: Secondary | ICD-10-CM | POA: Diagnosis not present

## 2017-02-02 DIAGNOSIS — M5136 Other intervertebral disc degeneration, lumbar region: Secondary | ICD-10-CM | POA: Diagnosis not present

## 2017-02-02 DIAGNOSIS — D472 Monoclonal gammopathy: Secondary | ICD-10-CM | POA: Diagnosis not present

## 2017-02-02 DIAGNOSIS — I10 Essential (primary) hypertension: Secondary | ICD-10-CM | POA: Diagnosis not present

## 2017-02-03 DIAGNOSIS — L578 Other skin changes due to chronic exposure to nonionizing radiation: Secondary | ICD-10-CM | POA: Diagnosis not present

## 2017-02-03 DIAGNOSIS — Z872 Personal history of diseases of the skin and subcutaneous tissue: Secondary | ICD-10-CM | POA: Diagnosis not present

## 2017-02-03 DIAGNOSIS — L298 Other pruritus: Secondary | ICD-10-CM | POA: Diagnosis not present

## 2017-02-03 DIAGNOSIS — L738 Other specified follicular disorders: Secondary | ICD-10-CM | POA: Diagnosis not present

## 2017-02-03 DIAGNOSIS — L570 Actinic keratosis: Secondary | ICD-10-CM | POA: Diagnosis not present

## 2017-02-17 DIAGNOSIS — H2513 Age-related nuclear cataract, bilateral: Secondary | ICD-10-CM | POA: Diagnosis not present

## 2017-02-17 DIAGNOSIS — H35033 Hypertensive retinopathy, bilateral: Secondary | ICD-10-CM | POA: Diagnosis not present

## 2017-02-17 DIAGNOSIS — H04123 Dry eye syndrome of bilateral lacrimal glands: Secondary | ICD-10-CM | POA: Diagnosis not present

## 2017-02-25 DIAGNOSIS — R69 Illness, unspecified: Secondary | ICD-10-CM | POA: Diagnosis not present

## 2017-03-09 DIAGNOSIS — R69 Illness, unspecified: Secondary | ICD-10-CM | POA: Diagnosis not present

## 2017-04-03 ENCOUNTER — Telehealth: Payer: Self-pay | Admitting: Gastroenterology

## 2017-04-03 MED ORDER — RIFAXIMIN 550 MG PO TABS: 550.0000 mg | ORAL_TABLET | Freq: Two times a day (BID) | ORAL | 3 refills | Status: DC

## 2017-04-03 NOTE — Telephone Encounter (Signed)
Called patient with no answer and no voicemail to leave a message. 

## 2017-04-03 NOTE — Addendum Note (Signed)
Addended by: Marzella Schlein on: 04/03/2017 10:02 AM   Modules accepted: Orders

## 2017-04-03 NOTE — Telephone Encounter (Signed)
Patient states he was not given the new patient assistance forms to fill out for Xifaxan and will run out of medication on Sunday. Informed patient I have some samples of Xifaxan I can give him until we get his patient assistance to go through. Patient gave me the web site info to pull up [employer] xifaxan patient assistance. Printed information for patient assistance and patient states he will come by today to pick up samples and to fill out the forms so we can fax them today.

## 2017-04-14 ENCOUNTER — Telehealth: Payer: Self-pay | Admitting: Gastroenterology

## 2017-04-14 NOTE — Telephone Encounter (Signed)
Patient states he has been on the phone with the patient assistance all day and they are telling him they have not looked at his forms and they are 2 months behind on paperwork. Patient states after his conversation, they did inform him that they will try to expedite one shipment to him. In the mean time patient got his PCP to send in a temporary prescription of Xifaxan to local pharmacy and he states he will pay for one month. Patient states the pharmacy may not have if in stock. Informed patient to contact me in the morning and let me know if he got the prescription and if not then maybe I can get him some more samples. Patient verbalized understanding.

## 2017-04-14 NOTE — Telephone Encounter (Signed)
Patient would like to speak with you. He said that he was returning your call.

## 2017-04-14 NOTE — Telephone Encounter (Signed)
Patient returned phone call. Best # [phone]

## 2017-04-14 NOTE — Telephone Encounter (Signed)
Attempted to call patient and line was busy.

## 2017-04-23 NOTE — Telephone Encounter (Signed)
Received fax from [facility] that patient is approved for Xifaxan patient assistance.

## 2017-05-14 ENCOUNTER — Other Ambulatory Visit (INDEPENDENT_AMBULATORY_CARE_PROVIDER_SITE_OTHER): Payer: Medicare HMO

## 2017-05-14 ENCOUNTER — Ambulatory Visit: Payer: Medicare HMO | Admitting: Gastroenterology

## 2017-05-14 ENCOUNTER — Encounter: Payer: Self-pay | Admitting: Gastroenterology

## 2017-05-14 VITALS — BP 120/74 | HR 68 | Ht 65.0 in | Wt 196.5 lb

## 2017-05-14 DIAGNOSIS — I8500 Esophageal varices without bleeding: Secondary | ICD-10-CM | POA: Diagnosis not present

## 2017-05-14 DIAGNOSIS — K7031 Alcoholic cirrhosis of liver with ascites: Secondary | ICD-10-CM

## 2017-05-14 DIAGNOSIS — R69 Illness, unspecified: Secondary | ICD-10-CM | POA: Diagnosis not present

## 2017-05-14 LAB — COMPREHENSIVE METABOLIC PANEL
ALK PHOS: 42 U/L (ref 39–117)
ALT: 23 U/L (ref 0–53)
AST: 33 U/L (ref 0–37)
Albumin: 4.1 g/dL (ref 3.5–5.2)
BUN: 21 mg/dL (ref 6–23)
CO2: 27 mEq/L (ref 19–32)
Calcium: 9.7 mg/dL (ref 8.4–10.5)
Chloride: 102 mEq/L (ref 96–112)
Creatinine, Ser: 1.16 mg/dL (ref 0.40–1.50)
GFR: 66.6 mL/min (ref >60.00)
GLUCOSE: 130 mg/dL — AB (ref 70–99)
POTASSIUM: 4.6 meq/L (ref 3.5–5.1)
Sodium: 134 mEq/L — ABNORMAL LOW (ref 135–145)
TOTAL PROTEIN: 7.5 g/dL (ref 6.0–8.3)
Total Bilirubin: 1.9 mg/dL — ABNORMAL HIGH (ref 0.2–1.2)

## 2017-05-14 LAB — CBC WITH DIFFERENTIAL/PLATELET
Basophils Absolute: 0.1 K/uL (ref 0.0–0.1)
Basophils Relative: 1.2 % (ref 0.0–3.0)
Eosinophils Absolute: 0.2 K/uL (ref 0.0–0.7)
Eosinophils Relative: 5.1 % — ABNORMAL HIGH (ref 0.0–5.0)
HCT: 40.9 % (ref 39.0–52.0)
Hemoglobin: 14.1 g/dL (ref 13.0–17.0)
Lymphocytes Relative: 24.1 % (ref 12.0–46.0)
Lymphs Abs: 1.1 K/uL (ref 0.7–4.0)
MCHC: 34.5 g/dL (ref 30.0–36.0)
MCV: 100.2 fl — ABNORMAL HIGH (ref 78.0–100.0)
Monocytes Absolute: 0.6 K/uL (ref 0.1–1.0)
Monocytes Relative: 13.4 % — ABNORMAL HIGH (ref 3.0–12.0)
Neutro Abs: 2.5 K/uL (ref 1.4–7.7)
Neutrophils Relative %: 56.2 % (ref 43.0–77.0)
Platelets: 99 K/uL — ABNORMAL LOW (ref 150.0–400.0)
RBC: 4.08 Mil/uL — ABNORMAL LOW (ref 4.22–5.81)
RDW: 14.1 % (ref 11.5–15.5)
WBC: 4.4 K/uL (ref 4.0–10.5)

## 2017-05-14 LAB — AMMONIA: Ammonia: 79 umol/L — ABNORMAL HIGH (ref 11–35)

## 2017-05-14 LAB — PROTIME-INR
INR: 1.2 ratio — ABNORMAL HIGH (ref 0.8–1.0)
Prothrombin Time: 13.7 s — ABNORMAL HIGH (ref 9.6–13.1)

## 2017-05-14 NOTE — Progress Notes (Signed)
    History of Present Illness: This is a 67 year old male with decompensated alcoholic cirrhosis with ascites, varices, HE.  Followed by Ramonita Lab, MD at [facility]  His weight has increased from 181 in May 2018 to 196 today.  He notes his abdomen is more protuberant.  Renal function was normal and stable in January.  He has no gastrointestinal complaints  Current Medications, Allergies, Past Medical History, Past Surgical History, Family History and Social History were reviewed in [employer] record.  Physical Exam: General: Well developed, well nourished, no acute distress Head: Normocephalic and atraumatic Eyes:  sclerae anicteric, EOMI Ears: Normal auditory acuity Mouth: No deformity or lesions Lungs: Clear throughout to auscultation Heart: Regular rate and rhythm; no murmurs, rubs or bruits Abdomen: Soft, non tender protuberant, and midly distended.  No clear fluid wave or shifting dullness.  No masses, hepatosplenomegaly or hernias noted. Normal Bowel sounds Rectal: not done Musculoskeletal: Symmetrical with no gross deformities  Pulses:  Normal pulses noted Extremities: No clubbing, cyanosis, edema or deformities noted Neurological: Alert oriented x 4, grossly nonfocal Psychological:  Alert and cooperative. Normal mood and affect  Assessment and Recommendations:  1. Decompensated alcoholic cirrhosis with ascites, esophageal varices, HE.  Unclear if weight gain is ascites or body weight.  Continue current dose of Spironolactone, furosemide, Xifaxan and pantoprazole. He is due for [facility] screening. Schedule RUQ [place]. Blood work today. Schedule surveillance EGD in July or August at hospital for surveillance of varices.  If significant ascites is noted on ultrasound and renal function is stable will to increase diuretics.  Closely following a low-sodium diet was again stressed.  The risks (including bleeding, perforation, infection, missed lesions, medication reactions and  possible hospitalization or surgery if complications occur), benefits, and alternatives to endoscopy with possible biopsy and possible dilation were discussed with the patient and they consent to proceed.  REV in 3 months.

## 2017-05-14 NOTE — Patient Instructions (Addendum)
Your provider has requested that you go to the basement level for lab work before leaving today. Press "B" on the elevator. The lab is located at the first door on the left as you exit the elevator.  You have been scheduled for an abdominal ultrasound at [facility] (1st floor of hospital) on 05/27/17 at 9:15am . Please arrive 15 minutes prior to your appointment for registration. Make certain not to have anything to eat or drink 6 hours prior to your appointment. Should you need to reschedule your appointment, please contact radiology at [phone]. This test typically takes about 30 minutes to perform.  We will contact you some time in May to schedule your Upper Endoscopy at [facility] for July.   Normal BMI (Body Mass Index- based on height and weight) is between 19 and 25. Your BMI today is Body mass index is 32.7 kg/m. Marland Kitchen Please consider follow up  regarding your BMI with your Primary Care Provider.  Thank you for choosing me and [facility].  Pricilla Riffle. Dagoberto Ligas., MD., Marval Regal

## 2017-05-15 ENCOUNTER — Encounter: Payer: Self-pay | Admitting: Gastroenterology

## 2017-05-15 LAB — AFP TUMOR MARKER: AFP-Tumor Marker: 8.9 ng/mL — ABNORMAL HIGH (ref <6.1)

## 2017-05-19 ENCOUNTER — Ambulatory Visit (HOSPITAL_COMMUNITY): Payer: Self-pay

## 2017-05-22 ENCOUNTER — Telehealth: Payer: Self-pay | Admitting: Gastroenterology

## 2017-05-22 ENCOUNTER — Other Ambulatory Visit: Payer: Self-pay

## 2017-05-22 DIAGNOSIS — K7031 Alcoholic cirrhosis of liver with ascites: Secondary | ICD-10-CM

## 2017-05-22 NOTE — Telephone Encounter (Signed)
See result note.  

## 2017-05-22 NOTE — Telephone Encounter (Signed)
Pt returned your call and would like a call back .. °

## 2017-05-27 ENCOUNTER — Telehealth: Payer: Self-pay

## 2017-05-27 ENCOUNTER — Ambulatory Visit (HOSPITAL_COMMUNITY)
Admission: RE | Admit: 2017-05-27 | Discharge: 2017-05-27 | Disposition: A | Discharge status: Home / Self Care | Payer: Medicare HMO | Source: Ambulatory Visit | Attending: Gastroenterology | Admitting: Gastroenterology

## 2017-05-27 DIAGNOSIS — K829 Disease of gallbladder, unspecified: Secondary | ICD-10-CM | POA: Diagnosis not present

## 2017-05-27 DIAGNOSIS — K7031 Alcoholic cirrhosis of liver with ascites: Secondary | ICD-10-CM

## 2017-05-27 DIAGNOSIS — R69 Illness, unspecified: Secondary | ICD-10-CM | POA: Diagnosis not present

## 2017-05-27 DIAGNOSIS — K8020 Calculus of gallbladder without cholecystitis without obstruction: Secondary | ICD-10-CM | POA: Insufficient documentation

## 2017-05-27 NOTE — Telephone Encounter (Signed)
Called patient to schedule Upper Endoscopy for 08/10/17 at 9:30am at [place] for esophageal varices screening. Mailed instructions to patient's home address. Informed patient once has received instructions to call back if he has any questions. Patient verbalized understanding.

## 2017-05-27 NOTE — Addendum Note (Signed)
Addended by: Marzella Schlein on: 05/27/2017 02:42 PM   Modules accepted: Orders, SmartSet

## 2017-06-09 DIAGNOSIS — L918 Other hypertrophic disorders of the skin: Secondary | ICD-10-CM | POA: Diagnosis not present

## 2017-06-09 DIAGNOSIS — L570 Actinic keratosis: Secondary | ICD-10-CM | POA: Diagnosis not present

## 2017-07-16 ENCOUNTER — Telehealth: Payer: Self-pay | Admitting: Gastroenterology

## 2017-07-16 NOTE — Telephone Encounter (Signed)
Case has been rescheduled to 09/22/17 11:15.  Patient notified to arrive at 9:45.  He verbalized understanding.

## 2017-07-17 DIAGNOSIS — B379 Candidiasis, unspecified: Secondary | ICD-10-CM | POA: Diagnosis not present

## 2017-07-17 DIAGNOSIS — R21 Rash and other nonspecific skin eruption: Secondary | ICD-10-CM | POA: Diagnosis not present

## 2017-07-27 ENCOUNTER — Telehealth: Payer: Self-pay | Admitting: Gastroenterology

## 2017-07-27 NOTE — Telephone Encounter (Signed)
Patient notified he is due for labs now.

## 2017-07-27 NOTE — Telephone Encounter (Signed)
Pt needs to know how long in advance he needs to come to do labs before his procedure scheduled on 09/22/17 at [facility].

## 2017-07-29 ENCOUNTER — Telehealth: Payer: Self-pay

## 2017-07-29 DIAGNOSIS — K7460 Unspecified cirrhosis of liver: Secondary | ICD-10-CM

## 2017-07-29 NOTE — Telephone Encounter (Signed)
BK-Req rec for refill of Spironolactone 50mg  bid to [facility] in [place]/it was faxed to Dr. Aron/thx dmf

## 2017-09-09 DIAGNOSIS — R69 Illness, unspecified: Secondary | ICD-10-CM | POA: Diagnosis not present

## 2017-09-14 DIAGNOSIS — R69 Illness, unspecified: Secondary | ICD-10-CM | POA: Diagnosis not present

## 2017-09-22 ENCOUNTER — Ambulatory Visit (HOSPITAL_COMMUNITY): Payer: Medicare HMO | Admitting: Anesthesiology

## 2017-09-22 ENCOUNTER — Other Ambulatory Visit: Payer: Self-pay

## 2017-09-22 ENCOUNTER — Encounter (HOSPITAL_COMMUNITY)
Admission: RE | Disposition: A | Discharge status: Home / Self Care | Payer: Self-pay | Source: Ambulatory Visit | Attending: Gastroenterology

## 2017-09-22 ENCOUNTER — Ambulatory Visit (HOSPITAL_COMMUNITY)
Admission: RE | Admit: 2017-09-22 | Discharge: 2017-09-22 | Disposition: A | Discharge status: Home / Self Care | Payer: Medicare HMO | Source: Ambulatory Visit | Attending: Gastroenterology | Admitting: Gastroenterology

## 2017-09-22 ENCOUNTER — Encounter (HOSPITAL_COMMUNITY): Payer: Self-pay

## 2017-09-22 DIAGNOSIS — K3189 Other diseases of stomach and duodenum: Secondary | ICD-10-CM | POA: Diagnosis not present

## 2017-09-22 DIAGNOSIS — K766 Portal hypertension: Secondary | ICD-10-CM | POA: Insufficient documentation

## 2017-09-22 DIAGNOSIS — Z79899 Other long term (current) drug therapy: Secondary | ICD-10-CM | POA: Diagnosis not present

## 2017-09-22 DIAGNOSIS — I10 Essential (primary) hypertension: Secondary | ICD-10-CM | POA: Diagnosis not present

## 2017-09-22 DIAGNOSIS — I8500 Esophageal varices without bleeding: Secondary | ICD-10-CM | POA: Insufficient documentation

## 2017-09-22 DIAGNOSIS — Z87891 Personal history of nicotine dependence: Secondary | ICD-10-CM | POA: Diagnosis not present

## 2017-09-22 DIAGNOSIS — K219 Gastro-esophageal reflux disease without esophagitis: Secondary | ICD-10-CM | POA: Diagnosis not present

## 2017-09-22 DIAGNOSIS — F419 Anxiety disorder, unspecified: Secondary | ICD-10-CM | POA: Diagnosis not present

## 2017-09-22 DIAGNOSIS — Z8673 Personal history of transient ischemic attack (TIA), and cerebral infarction without residual deficits: Secondary | ICD-10-CM | POA: Insufficient documentation

## 2017-09-22 DIAGNOSIS — K7031 Alcoholic cirrhosis of liver with ascites: Secondary | ICD-10-CM | POA: Insufficient documentation

## 2017-09-22 DIAGNOSIS — R69 Illness, unspecified: Secondary | ICD-10-CM | POA: Diagnosis not present

## 2017-09-22 HISTORY — PX: ESOPHAGEAL BANDING: SHX5518

## 2017-09-22 HISTORY — PX: ESOPHAGOGASTRODUODENOSCOPY (EGD) WITH PROPOFOL: SHX5813

## 2017-09-22 SURGERY — ESOPHAGOGASTRODUODENOSCOPY (EGD) WITH PROPOFOL
Anesthesia: Monitor Anesthesia Care

## 2017-09-22 MED ORDER — LACTATED RINGERS IV SOLN
INTRAVENOUS | Status: DC
  Administered 2017-09-22: 1000 mL via INTRAVENOUS

## 2017-09-22 MED ORDER — PROPOFOL 10 MG/ML IV BOLUS
INTRAVENOUS | Status: AC
  Filled 2017-09-22: qty 20

## 2017-09-22 MED ORDER — PROPOFOL 10 MG/ML IV BOLUS
INTRAVENOUS | Status: DC | PRN
  Administered 2017-09-22: 125 ug/kg/min via INTRAVENOUS

## 2017-09-22 MED ORDER — LIDOCAINE 2% (20 MG/ML) 5 ML SYRINGE
INTRAMUSCULAR | Status: DC | PRN
  Administered 2017-09-22: 50 mg via INTRAVENOUS

## 2017-09-22 MED ORDER — PROPOFOL 10 MG/ML IV BOLUS
INTRAVENOUS | Status: DC | PRN
  Administered 2017-09-22: 50 mg via INTRAVENOUS

## 2017-09-22 MED ORDER — SODIUM CHLORIDE 0.9 % IV SOLN: INTRAVENOUS | Status: DC

## 2017-09-22 SURGICAL SUPPLY — 15 items

## 2017-09-22 NOTE — Op Note (Signed)
[facility] Patient Name: Jeff Ward Procedure Date: 09/22/2017 MRN: [id number] Attending MD: Ladene Artist , MD Date of Birth: September 23, 1950 CSN: [id number] Age: 67 Admit Type: Outpatient Procedure:                Upper GI endoscopy Indications:              Surveillance procedure for esophageal varices. Providers:                Pricilla Riffle. Fuller Plan, MD, Vista Lawman, RN, Cletis Athens, Technician, Stephanie [place] CRNA Referring MD:             Ramonita Lab, MD Medicines:                Monitored Anesthesia Care Complications:            No immediate complications. Estimated Blood Loss:     Estimated blood loss: none. Procedure:                Pre-Anesthesia Assessment:                           - Prior to the procedure, a History and Physical                            was performed, and patient medications and                            allergies were reviewed. The patient's tolerance of                            previous anesthesia was also reviewed. The risks                            and benefits of the procedure and the sedation                            options and risks were discussed with the patient.                            All questions were answered, and informed consent                            was obtained. Prior Anticoagulants: The patient has                            taken no previous anticoagulant or antiplatelet                            agents. ASA Grade Assessment: III - A patient with                            severe systemic disease. After reviewing the risks  and benefits, the patient was deemed in                            satisfactory condition to undergo the procedure.                           After obtaining informed consent, the endoscope was                            passed under direct vision. Throughout the                            procedure, the patient's blood pressure,  pulse, and                            oxygen saturations were monitored continuously. The                            GIF-H190 ([id number]) Olympus adult endoscope was                            introduced through the mouth, and advanced to the                            second part of duodenum. The upper GI endoscopy was                            accomplished without difficulty. The patient                            tolerated the procedure well. Scope In: Scope Out: Findings:      Grade II varices, 2 columns, were found in the distal esophagus. They       were 6 mm in largest diameter. Scarring from prior band placement noted.       Three bands were successfully placed on 2 columns with complete       eradication, resulting in deflation of varices. There was no bleeding       during the procedure.      The exam of the esophagus was otherwise normal.      Moderate portal hypertensive gastropathy was found in the entire       examined stomach.      The exam of the stomach was otherwise normal.      The duodenal bulb and second portion of the duodenum were normal. Impression:               - Grade II esophageal varices. Completely                            eradicated. Banded.                           - Portal hypertensive gastropathy.                           - Normal duodenal bulb and second portion of  the                            duodenum.                           - No specimens collected. Moderate Sedation:      N/A- Per Anesthesia Care Recommendation:           - Patient has a contact number available for                            emergencies. The signs and symptoms of potential                            delayed complications were discussed with the                            patient. Return to normal activities tomorrow.                            Written discharge instructions were provided to the                            patient.                           - Full liquid  diet for 4 hours, then advance as                            tolerated to soft diet today.                           - Continue present medications.                           - Repeat upper endoscopy in 1 year for surveillance. Procedure Code(s):        --- Professional ---                           [phone], Esophagogastroduodenoscopy, flexible,                            transoral; with band ligation of esophageal/gastric                            varices Diagnosis Code(s):        --- Professional ---                           I85.00, Esophageal varices without bleeding                           K76.6, Portal hypertension                           K31.89, Other diseases of stomach and duodenum CPT copyright 2017 American Medical Association. All rights  reserved. The codes documented in this report are preliminary and upon coder review may  be revised to meet current compliance requirements. Ladene Artist, MD 09/22/2017 10:30:43 AM This report has been signed electronically. Number of Addenda: 0

## 2017-09-22 NOTE — Transfer of Care (Signed)
Immediate Anesthesia Transfer of Care Note  Patient: Jeff Ward  Procedure(s) Performed: ESOPHAGOGASTRODUODENOSCOPY (EGD) WITH PROPOFOL (N/A ) ESOPHAGEAL BANDING  Patient Location: PACU and Endoscopy Unit  Anesthesia Type:MAC  Level of Consciousness: awake, alert  and oriented  Airway & Oxygen Therapy: Patient Spontanous Breathing  Post-op Assessment: Report given to RN and Post -op Vital signs reviewed and stable  Post vital signs: Reviewed and stable  Last Vitals:  Vitals Value Taken Time  BP    Temp    Pulse 65 09/22/2017 10:29 AM  Resp 16 09/22/2017 10:29 AM  SpO2 98 % 09/22/2017 10:29 AM  Vitals shown include unvalidated device data.  Last Pain:  Vitals:   09/22/17 0908  TempSrc: Oral  PainSc: 0-No pain         Complications: No apparent anesthesia complications

## 2017-09-22 NOTE — Anesthesia Preprocedure Evaluation (Signed)
Anesthesia Evaluation  Patient identified by MRN, date of birth, ID band Patient awake    Reviewed: Allergy & Precautions, NPO status , Patient's Chart, lab work & pertinent test results  Airway Mallampati: II  TM Distance: >3 FB Neck ROM: Full    Dental no notable dental hx.    Pulmonary neg pulmonary ROS, former smoker,    Pulmonary exam normal breath sounds clear to auscultation       Cardiovascular hypertension, negative cardio ROS Normal cardiovascular exam Rhythm:Regular Rate:Normal     Neuro/Psych Anxiety CVA    GI/Hepatic negative GI ROS, hiatal hernia, PUD, GERD  ,(+) Cirrhosis     substance abuse  alcohol use,   Endo/Other  negative endocrine ROS  Renal/GU negative Renal ROS  negative genitourinary   Musculoskeletal negative musculoskeletal ROS (+)   Abdominal   Peds negative pediatric ROS (+)  Hematology negative hematology ROS (+)   Anesthesia Other Findings   Reproductive/Obstetrics negative OB ROS                             Anesthesia Physical Anesthesia Plan  ASA: IV  Anesthesia Plan: MAC   Post-op Pain Management:    Induction: Intravenous  PONV Risk Score and Plan:   Airway Management Planned: Simple Face Mask  Additional Equipment:   Intra-op Plan:   Post-operative Plan:   Informed Consent: I have reviewed the patients History and Physical, chart, labs and discussed the procedure including the risks, benefits and alternatives for the proposed anesthesia with the patient or authorized representative who has indicated his/her understanding and acceptance.   Dental advisory given  Plan Discussed with: CRNA and Surgeon  Anesthesia Plan Comments:         Anesthesia Quick Evaluation

## 2017-09-22 NOTE — H&P (Signed)
This is a 67 year old male with decompensated alcoholic cirrhosis with ascites, varices, HE.  Followed by Jeff Lab, MD at [facility].  His weight has increased from 181 in May 2018 to 196 today.  He notes his abdomen is more protuberant.  Renal function was normal and stable in January.  He has no gastrointestinal complaints  Current Medications, Allergies, Past Medical History, Past Surgical History, Family History and Social History were reviewed in [employer] record.  Physical Exam: General: Well developed, well nourished, no acute distress Head: Normocephalic and atraumatic Eyes:  sclerae anicteric, EOMI Ears: Normal auditory acuity Mouth: No deformity or lesions Lungs: Clear throughout to auscultation Heart: Regular rate and rhythm; no murmurs, rubs or bruits Abdomen: Soft, non tender protuberant, and midly distended.  No clear fluid wave or shifting dullness.  No masses, hepatosplenomegaly or hernias noted. Normal Bowel sounds Rectal: not done Musculoskeletal: Symmetrical with no gross deformities  Pulses:  Normal pulses noted Extremities: No clubbing, cyanosis, edema or deformities noted Neurological: Alert oriented x 4, grossly nonfocal Psychological:  Alert and cooperative. Normal mood and affect  Assessment and Recommendations:  1. Decompensated alcoholic cirrhosis with ascites, esophageal varices, HE.  Unclear if weight gain is ascites or body weight.  Continue current dose of Spironolactone, furosemide, Xifaxan and pantoprazole. He is due for [facility] screening. Schedule RUQ [place]. Blood work today. Schedule surveillance EGD in July or August at hospital for surveillance of varices.  If significant ascites is noted on ultrasound and renal function is stable will to increase diuretics.  Closely following a low-sodium diet was again stressed.  The risks (including bleeding, perforation, infection, missed lesions, medication reactions and possible hospitalization or  surgery if complications occur), benefits, and alternatives to endoscopy with possible biopsy and possible dilation were discussed with the patient and they consent to proceed.  REV in 3 months.

## 2017-09-22 NOTE — Anesthesia Postprocedure Evaluation (Signed)
Anesthesia Post Note  Patient: Jeff Ward  Procedure(s) Performed: ESOPHAGOGASTRODUODENOSCOPY (EGD) WITH PROPOFOL (N/A ) ESOPHAGEAL BANDING     Patient location during evaluation: PACU Anesthesia Type: MAC Level of consciousness: awake and alert Pain management: pain level controlled Vital Signs Assessment: post-procedure vital signs reviewed and stable Respiratory status: spontaneous breathing, nonlabored ventilation, respiratory function stable and patient connected to nasal cannula oxygen Cardiovascular status: stable and blood pressure returned to baseline Postop Assessment: no apparent nausea or vomiting Anesthetic complications: no    Last Vitals:  Vitals:   09/22/17 1040 09/22/17 1050  BP: (!) 169/73 (!) 191/71  Pulse: (!) 54 (!) 50  Resp: 16 16  Temp:    SpO2: 100% 100%    Last Pain:  Vitals:   09/22/17 1040  TempSrc:   PainSc: 8                  Roxan Yamamoto S

## 2017-09-22 NOTE — Discharge Instructions (Signed)
YOU HAD AN ENDOSCOPIC PROCEDURE TODAY: Refer to the procedure report and other information in the discharge instructions given to you for any specific questions about what was found during the examination. If this information does not answer your questions, please call [place] office at [phone] to clarify.   YOU SHOULD EXPECT: Some feelings of bloating in the abdomen. Passage of more gas than usual. Walking can help get rid of the air that was put into your GI tract during the procedure and reduce the bloating. If you had a lower endoscopy (such as a colonoscopy or flexible sigmoidoscopy) you may notice spotting of blood in your stool or on the toilet paper. Some abdominal soreness may be present for a day or two, also.  DIET: Your first meal following the procedure should be a light meal and then it is ok to progress to your normal diet. A half-sandwich or bowl of soup is an example of a good first meal. Heavy or fried foods are harder to digest and may make you feel nauseous or bloated. Drink plenty of fluids but you should avoid alcoholic beverages for 24 hours. If you had a esophageal dilation, please see attached instructions for diet.    ACTIVITY: Your care partner should take you home directly after the procedure. You should plan to take it easy, moving slowly for the rest of the day. You can resume normal activity the day after the procedure however YOU SHOULD NOT DRIVE, use power tools, machinery or perform tasks that involve climbing or major physical exertion for 24 hours (because of the sedation medicines used during the test).   SYMPTOMS TO REPORT IMMEDIATELY: A gastroenterologist can be reached at any hour. Please call [phone]  for any of the following symptoms:   Following upper endoscopy (EGD, EUS, ERCP, esophageal dilation) Vomiting of blood or coffee ground material  New, significant abdominal pain  New, significant chest pain or pain under the shoulder blades  Painful or  persistently difficult swallowing  New shortness of breath  Black, tarry-looking or red, bloody stools  FOLLOW UP:  If any biopsies were taken you will be contacted by phone or by letter within the next 1-3 weeks. Call [phone]  if you have not heard about the biopsies in 3 weeks.  Please also call with any specific questions about appointments or follow up tests.  

## 2017-09-23 ENCOUNTER — Encounter (HOSPITAL_COMMUNITY): Payer: Self-pay | Admitting: Gastroenterology

## 2017-09-23 DIAGNOSIS — Z125 Encounter for screening for malignant neoplasm of prostate: Secondary | ICD-10-CM | POA: Diagnosis not present

## 2017-09-23 DIAGNOSIS — E349 Endocrine disorder, unspecified: Secondary | ICD-10-CM | POA: Diagnosis not present

## 2017-09-23 DIAGNOSIS — I10 Essential (primary) hypertension: Secondary | ICD-10-CM | POA: Diagnosis not present

## 2017-09-23 DIAGNOSIS — D472 Monoclonal gammopathy: Secondary | ICD-10-CM | POA: Diagnosis not present

## 2017-09-23 DIAGNOSIS — R69 Illness, unspecified: Secondary | ICD-10-CM | POA: Diagnosis not present

## 2017-09-23 DIAGNOSIS — E7849 Other hyperlipidemia: Secondary | ICD-10-CM | POA: Diagnosis not present

## 2017-09-23 DIAGNOSIS — R739 Hyperglycemia, unspecified: Secondary | ICD-10-CM | POA: Diagnosis not present

## 2017-09-23 DIAGNOSIS — N183 Chronic kidney disease, stage 3 (moderate): Secondary | ICD-10-CM | POA: Diagnosis not present

## 2017-09-29 DIAGNOSIS — E349 Endocrine disorder, unspecified: Secondary | ICD-10-CM | POA: Diagnosis not present

## 2017-09-29 DIAGNOSIS — Z0000 Encounter for general adult medical examination without abnormal findings: Secondary | ICD-10-CM | POA: Diagnosis not present

## 2017-09-29 DIAGNOSIS — R69 Illness, unspecified: Secondary | ICD-10-CM | POA: Diagnosis not present

## 2017-09-29 DIAGNOSIS — Z23 Encounter for immunization: Secondary | ICD-10-CM | POA: Diagnosis not present

## 2017-09-29 DIAGNOSIS — D472 Monoclonal gammopathy: Secondary | ICD-10-CM | POA: Diagnosis not present

## 2017-09-29 DIAGNOSIS — M5136 Other intervertebral disc degeneration, lumbar region: Secondary | ICD-10-CM | POA: Diagnosis not present

## 2017-09-29 DIAGNOSIS — E7849 Other hyperlipidemia: Secondary | ICD-10-CM | POA: Diagnosis not present

## 2017-09-29 DIAGNOSIS — I10 Essential (primary) hypertension: Secondary | ICD-10-CM | POA: Diagnosis not present

## 2017-09-29 DIAGNOSIS — N183 Chronic kidney disease, stage 3 (moderate): Secondary | ICD-10-CM | POA: Diagnosis not present

## 2017-09-29 DIAGNOSIS — I8510 Secondary esophageal varices without bleeding: Secondary | ICD-10-CM | POA: Diagnosis not present

## 2017-10-12 DIAGNOSIS — R69 Illness, unspecified: Secondary | ICD-10-CM | POA: Diagnosis not present

## 2017-11-30 ENCOUNTER — Telehealth: Payer: Self-pay

## 2017-11-30 NOTE — Telephone Encounter (Signed)
-----   Message from Marlon Pel, RN sent at 05/28/2017  9:36 AM EDT ----- Needs [place] see results 05/28/17- stark

## 2017-11-30 NOTE — Telephone Encounter (Signed)
Left message for patient to call back  

## 2017-12-02 ENCOUNTER — Other Ambulatory Visit: Payer: Self-pay

## 2017-12-02 DIAGNOSIS — K7030 Alcoholic cirrhosis of liver without ascites: Secondary | ICD-10-CM

## 2017-12-02 DIAGNOSIS — K7460 Unspecified cirrhosis of liver: Secondary | ICD-10-CM

## 2017-12-02 DIAGNOSIS — K7031 Alcoholic cirrhosis of liver with ascites: Secondary | ICD-10-CM

## 2017-12-02 NOTE — Telephone Encounter (Signed)
Left message to call back  

## 2017-12-02 NOTE — Telephone Encounter (Signed)
An abdominal ultrasound has been scheduled for 12/10/2017 at [place] at 10:15 am for a 10:30 scan.  A letter has been sent by mail to the pt with this information inclosed.

## 2017-12-10 ENCOUNTER — Ambulatory Visit (HOSPITAL_COMMUNITY): Payer: Medicare HMO

## 2017-12-29 DIAGNOSIS — M545 Low back pain: Secondary | ICD-10-CM | POA: Diagnosis not present

## 2017-12-30 ENCOUNTER — Telehealth: Payer: Self-pay | Admitting: Gastroenterology

## 2017-12-30 NOTE — Telephone Encounter (Signed)
Patient is ready to reschedule his [place] that was canceled on 11.14.19. Patient requesting it be reschedule in [place] if possible.

## 2017-12-30 NOTE — Telephone Encounter (Signed)
Patient provided the number to central scheduling

## 2018-01-05 ENCOUNTER — Ambulatory Visit
Admission: RE | Admit: 2018-01-05 | Discharge: 2018-01-05 | Disposition: A | Discharge status: Home / Self Care | Payer: Medicare HMO | Source: Ambulatory Visit | Attending: Gastroenterology | Admitting: Gastroenterology

## 2018-01-05 DIAGNOSIS — K7460 Unspecified cirrhosis of liver: Secondary | ICD-10-CM | POA: Insufficient documentation

## 2018-01-05 DIAGNOSIS — K8020 Calculus of gallbladder without cholecystitis without obstruction: Secondary | ICD-10-CM | POA: Diagnosis not present

## 2018-01-12 DIAGNOSIS — I10 Essential (primary) hypertension: Secondary | ICD-10-CM | POA: Diagnosis not present

## 2018-01-12 DIAGNOSIS — I8510 Secondary esophageal varices without bleeding: Secondary | ICD-10-CM | POA: Diagnosis not present

## 2018-01-12 DIAGNOSIS — M5136 Other intervertebral disc degeneration, lumbar region: Secondary | ICD-10-CM | POA: Diagnosis not present

## 2018-01-12 DIAGNOSIS — E349 Endocrine disorder, unspecified: Secondary | ICD-10-CM | POA: Diagnosis not present

## 2018-01-12 DIAGNOSIS — D472 Monoclonal gammopathy: Secondary | ICD-10-CM | POA: Diagnosis not present

## 2018-01-12 DIAGNOSIS — R69 Illness, unspecified: Secondary | ICD-10-CM | POA: Diagnosis not present

## 2018-01-12 DIAGNOSIS — E7849 Other hyperlipidemia: Secondary | ICD-10-CM | POA: Diagnosis not present

## 2018-01-12 DIAGNOSIS — R739 Hyperglycemia, unspecified: Secondary | ICD-10-CM | POA: Diagnosis not present

## 2018-01-12 DIAGNOSIS — N183 Chronic kidney disease, stage 3 (moderate): Secondary | ICD-10-CM | POA: Diagnosis not present

## 2018-01-13 ENCOUNTER — Telehealth: Payer: Self-pay

## 2018-01-13 ENCOUNTER — Other Ambulatory Visit: Payer: Self-pay | Admitting: Internal Medicine

## 2018-01-13 DIAGNOSIS — M545 Low back pain, unspecified: Secondary | ICD-10-CM

## 2018-01-13 NOTE — Telephone Encounter (Signed)
Informed patient I faxed the patient assistance for Xifaxan to [facility] for the next year and to also schedule a follow up visit at his convenience. Patient states he will call back to schedule the visit.

## 2018-01-19 ENCOUNTER — Ambulatory Visit
Admission: RE | Admit: 2018-01-19 | Discharge: 2018-01-19 | Disposition: A | Discharge status: Home / Self Care | Payer: Medicare HMO | Source: Ambulatory Visit | Attending: Internal Medicine | Admitting: Internal Medicine

## 2018-01-19 DIAGNOSIS — M545 Low back pain, unspecified: Secondary | ICD-10-CM

## 2018-01-19 DIAGNOSIS — M4807 Spinal stenosis, lumbosacral region: Secondary | ICD-10-CM | POA: Insufficient documentation

## 2018-01-19 DIAGNOSIS — M5126 Other intervertebral disc displacement, lumbar region: Secondary | ICD-10-CM | POA: Insufficient documentation

## 2018-01-19 DIAGNOSIS — M5137 Other intervertebral disc degeneration, lumbosacral region: Secondary | ICD-10-CM | POA: Diagnosis not present

## 2018-01-19 DIAGNOSIS — M47896 Other spondylosis, lumbar region: Secondary | ICD-10-CM | POA: Insufficient documentation

## 2018-01-19 DIAGNOSIS — M438X4 Other specified deforming dorsopathies, thoracic region: Secondary | ICD-10-CM | POA: Diagnosis not present

## 2018-01-19 DIAGNOSIS — R29890 Loss of height: Secondary | ICD-10-CM | POA: Insufficient documentation

## 2018-01-19 DIAGNOSIS — M2578 Osteophyte, vertebrae: Secondary | ICD-10-CM | POA: Insufficient documentation

## 2018-01-19 DIAGNOSIS — M8938 Hypertrophy of bone, other site: Secondary | ICD-10-CM | POA: Diagnosis not present

## 2018-01-19 DIAGNOSIS — M48061 Spinal stenosis, lumbar region without neurogenic claudication: Secondary | ICD-10-CM | POA: Diagnosis not present

## 2018-01-19 DIAGNOSIS — S3992XA Unspecified injury of lower back, initial encounter: Secondary | ICD-10-CM | POA: Diagnosis not present

## 2018-01-25 DIAGNOSIS — M5136 Other intervertebral disc degeneration, lumbar region: Secondary | ICD-10-CM | POA: Diagnosis not present

## 2018-01-25 DIAGNOSIS — M5416 Radiculopathy, lumbar region: Secondary | ICD-10-CM | POA: Diagnosis not present

## 2018-02-23 DIAGNOSIS — M5416 Radiculopathy, lumbar region: Secondary | ICD-10-CM | POA: Diagnosis not present

## 2018-02-23 DIAGNOSIS — M5136 Other intervertebral disc degeneration, lumbar region: Secondary | ICD-10-CM | POA: Diagnosis not present

## 2018-02-24 DIAGNOSIS — H35033 Hypertensive retinopathy, bilateral: Secondary | ICD-10-CM | POA: Diagnosis not present

## 2018-02-24 DIAGNOSIS — H2513 Age-related nuclear cataract, bilateral: Secondary | ICD-10-CM | POA: Diagnosis not present

## 2018-02-24 DIAGNOSIS — H04123 Dry eye syndrome of bilateral lacrimal glands: Secondary | ICD-10-CM | POA: Diagnosis not present

## 2018-03-15 NOTE — Telephone Encounter (Signed)
PT says that med Doreene Nest has been approved and would like to know if it has been sent.

## 2018-03-16 ENCOUNTER — Other Ambulatory Visit: Payer: Self-pay

## 2018-03-16 MED ORDER — RIFAXIMIN 550 MG PO TABS: 550.0000 mg | ORAL_TABLET | Freq: Two times a day (BID) | ORAL | 3 refills | Status: DC

## 2018-03-16 NOTE — Telephone Encounter (Signed)
Informed patient on voicemail that he can contact the patient assistance to find out when it will be shipped. That they do not normally let us know when it will be shipped. Told him to call our office back if he has any other questions.

## 2018-03-16 NOTE — Telephone Encounter (Signed)
Patient states that the message yesterday was suppose to state that patient needs a prescription sent to the patient assistance program along with the paper work. They state they never received it. Informed patient that I will resend the prescription for patient assistance. Patient verbalized understanding.

## 2018-03-24 DIAGNOSIS — D472 Monoclonal gammopathy: Secondary | ICD-10-CM | POA: Diagnosis not present

## 2018-03-24 DIAGNOSIS — I10 Essential (primary) hypertension: Secondary | ICD-10-CM | POA: Diagnosis not present

## 2018-03-24 DIAGNOSIS — E349 Endocrine disorder, unspecified: Secondary | ICD-10-CM | POA: Diagnosis not present

## 2018-03-24 DIAGNOSIS — N183 Chronic kidney disease, stage 3 (moderate): Secondary | ICD-10-CM | POA: Diagnosis not present

## 2018-03-24 DIAGNOSIS — R739 Hyperglycemia, unspecified: Secondary | ICD-10-CM | POA: Diagnosis not present

## 2018-03-24 DIAGNOSIS — E7849 Other hyperlipidemia: Secondary | ICD-10-CM | POA: Diagnosis not present

## 2018-03-24 DIAGNOSIS — R69 Illness, unspecified: Secondary | ICD-10-CM | POA: Diagnosis not present

## 2018-03-24 DIAGNOSIS — M5416 Radiculopathy, lumbar region: Secondary | ICD-10-CM | POA: Diagnosis not present

## 2018-03-24 DIAGNOSIS — M5136 Other intervertebral disc degeneration, lumbar region: Secondary | ICD-10-CM | POA: Diagnosis not present

## 2018-03-30 DIAGNOSIS — M5136 Other intervertebral disc degeneration, lumbar region: Secondary | ICD-10-CM | POA: Diagnosis not present

## 2018-03-30 DIAGNOSIS — I10 Essential (primary) hypertension: Secondary | ICD-10-CM | POA: Diagnosis not present

## 2018-03-30 DIAGNOSIS — D472 Monoclonal gammopathy: Secondary | ICD-10-CM | POA: Diagnosis not present

## 2018-03-30 DIAGNOSIS — E7849 Other hyperlipidemia: Secondary | ICD-10-CM | POA: Diagnosis not present

## 2018-03-30 DIAGNOSIS — I8510 Secondary esophageal varices without bleeding: Secondary | ICD-10-CM | POA: Diagnosis not present

## 2018-03-30 DIAGNOSIS — N183 Chronic kidney disease, stage 3 (moderate): Secondary | ICD-10-CM | POA: Diagnosis not present

## 2018-03-30 DIAGNOSIS — R200 Anesthesia of skin: Secondary | ICD-10-CM | POA: Diagnosis not present

## 2018-03-30 DIAGNOSIS — R69 Illness, unspecified: Secondary | ICD-10-CM | POA: Diagnosis not present

## 2018-03-30 DIAGNOSIS — R739 Hyperglycemia, unspecified: Secondary | ICD-10-CM | POA: Diagnosis not present

## 2018-03-30 DIAGNOSIS — E349 Endocrine disorder, unspecified: Secondary | ICD-10-CM | POA: Diagnosis not present

## 2018-04-01 ENCOUNTER — Encounter: Payer: Self-pay | Admitting: Gastroenterology

## 2018-04-01 ENCOUNTER — Ambulatory Visit: Payer: Medicare HMO | Admitting: Gastroenterology

## 2018-04-01 ENCOUNTER — Other Ambulatory Visit (INDEPENDENT_AMBULATORY_CARE_PROVIDER_SITE_OTHER): Payer: Medicare HMO

## 2018-04-01 VITALS — BP 136/60 | HR 68 | Ht 65.0 in | Wt 201.5 lb

## 2018-04-01 DIAGNOSIS — Z86010 Personal history of colonic polyps: Secondary | ICD-10-CM

## 2018-04-01 DIAGNOSIS — R69 Illness, unspecified: Secondary | ICD-10-CM | POA: Diagnosis not present

## 2018-04-01 DIAGNOSIS — K7031 Alcoholic cirrhosis of liver with ascites: Secondary | ICD-10-CM | POA: Diagnosis not present

## 2018-04-01 LAB — AMMONIA: Ammonia: 71 umol/L — ABNORMAL HIGH (ref 11–35)

## 2018-04-01 LAB — PROTIME-INR
INR: 1.3 ratio — AB (ref 0.8–1.0)
Prothrombin Time: 14.8 s — ABNORMAL HIGH (ref 9.6–13.1)

## 2018-04-01 NOTE — Patient Instructions (Signed)
Your provider has requested that you go to the basement level for lab work before leaving today. Press "B" on the elevator. The lab is located at the first door on the left as you exit the elevator.  Thank you for choosing me and [facility].  Malcolm T. Stark, Jr., MD., FACG  

## 2018-04-01 NOTE — Progress Notes (Signed)
    History of Present Illness: This is a 68 year old male with decompensated alcoholic cirrhosis with ascites, esophageal varices, portal gastropathy, HE.  He has not GI complaints. Weight is stable within a few pounds. He is closely followed by his PCP Ramonita Lab, MD at [facility]. Grade 2 esophageal varices banded in August 2019.  Abdominal ultrasound in December 2019 showed gallstones and a slightly prominent gallbladder wall, cirrhosis, no focal hepatic lesions.  Current Medications, Allergies, Past Medical History, Past Surgical History, Family History and Social History were reviewed in [employer] record.  Physical Exam: General: Well developed, well nourished, no acute distress Head: Normocephalic and atraumatic Eyes:  sclerae anicteric, EOMI Ears: Normal auditory acuity Mouth: No deformity or lesions Lungs: Clear throughout to auscultation Heart: Regular rate and rhythm; no murmurs, rubs or bruits Abdomen: Soft, non tender and distended. No masses, hepatosplenomegaly or hernias noted. Normal Bowel sounds Rectal: Not done Musculoskeletal: Symmetrical with no gross deformities  Pulses:  Normal pulses noted Extremities: No clubbing, cyanosis, edema or deformities noted Neurological: Alert oriented x 4, grossly nonfocal Psychological:  Alert and cooperative. Normal mood and affect   Assessment and Recommendations:  1.  Decompensated alcoholic cirrhosis with ascites, esophageal varices, portal gastropathy, HE.  PT/INR, ammonia, AFP today.  Continue nadolol 20 mg daily, Lasix 20 mg daily, Aldactone 50 mg daily, Xifaxan 550 mg twice daily, FeSO4 daily.  2.  Personal history of 2 small adenomatous colon polyp in 2008. Colonoscopy in 2015 was negative for precancerous polyps. Per guidelines a 10 year interval colonoscopy is recommended in 06/2023.

## 2018-04-02 ENCOUNTER — Other Ambulatory Visit: Payer: Self-pay

## 2018-04-02 DIAGNOSIS — K7031 Alcoholic cirrhosis of liver with ascites: Secondary | ICD-10-CM

## 2018-04-02 LAB — AFP TUMOR MARKER: AFP TUMOR MARKER: 10.5 ng/mL — AB (ref <6.1)

## 2018-04-07 ENCOUNTER — Telehealth: Payer: Self-pay

## 2018-04-07 NOTE — Telephone Encounter (Signed)
Rescheduled patient's CT for 04/14/18 at 8:30am. Instructed patient on new date and time. Patient verbalized understanding.

## 2018-04-07 NOTE — Telephone Encounter (Signed)
Ada (pre-cert) coordinator states CT scan is still under review and we need to reschedule CT scan. Amy states she has tried to contact insurance and expedite approval but they will not budge and need longer to review. Called and left a message for patient to call me back.

## 2018-04-08 ENCOUNTER — Ambulatory Visit: Admission: RE | Admit: 2018-04-08 | Payer: Medicare HMO | Source: Ambulatory Visit

## 2018-04-12 ENCOUNTER — Telehealth: Payer: Self-pay

## 2018-04-12 NOTE — Telephone Encounter (Signed)
Received call from pre-cert coordinator that CT is not approved by insurance since he had ultrasound in December. Informed Dr. Fuller Plan. Dr. Fuller Plan states to change 6 month ultrasound check for [facility] screening in June to a Ct abd and pelvis. Reminder put in to schedule CT scan in June.

## 2018-04-14 ENCOUNTER — Ambulatory Visit: Payer: Medicare HMO

## 2018-04-21 DIAGNOSIS — M5416 Radiculopathy, lumbar region: Secondary | ICD-10-CM | POA: Diagnosis not present

## 2018-04-21 DIAGNOSIS — M5136 Other intervertebral disc degeneration, lumbar region: Secondary | ICD-10-CM | POA: Diagnosis not present

## 2018-06-10 IMAGING — DX DG ABDOMEN 1V
2 series · 2 of 2 positions shown · non-contrast
Comparison: 07/26/2015 abdominal radiograph

CLINICAL DATA: Ileus

EXAM:
ABDOMEN - 1 VIEW

[abdomen kub (1 of 2)]
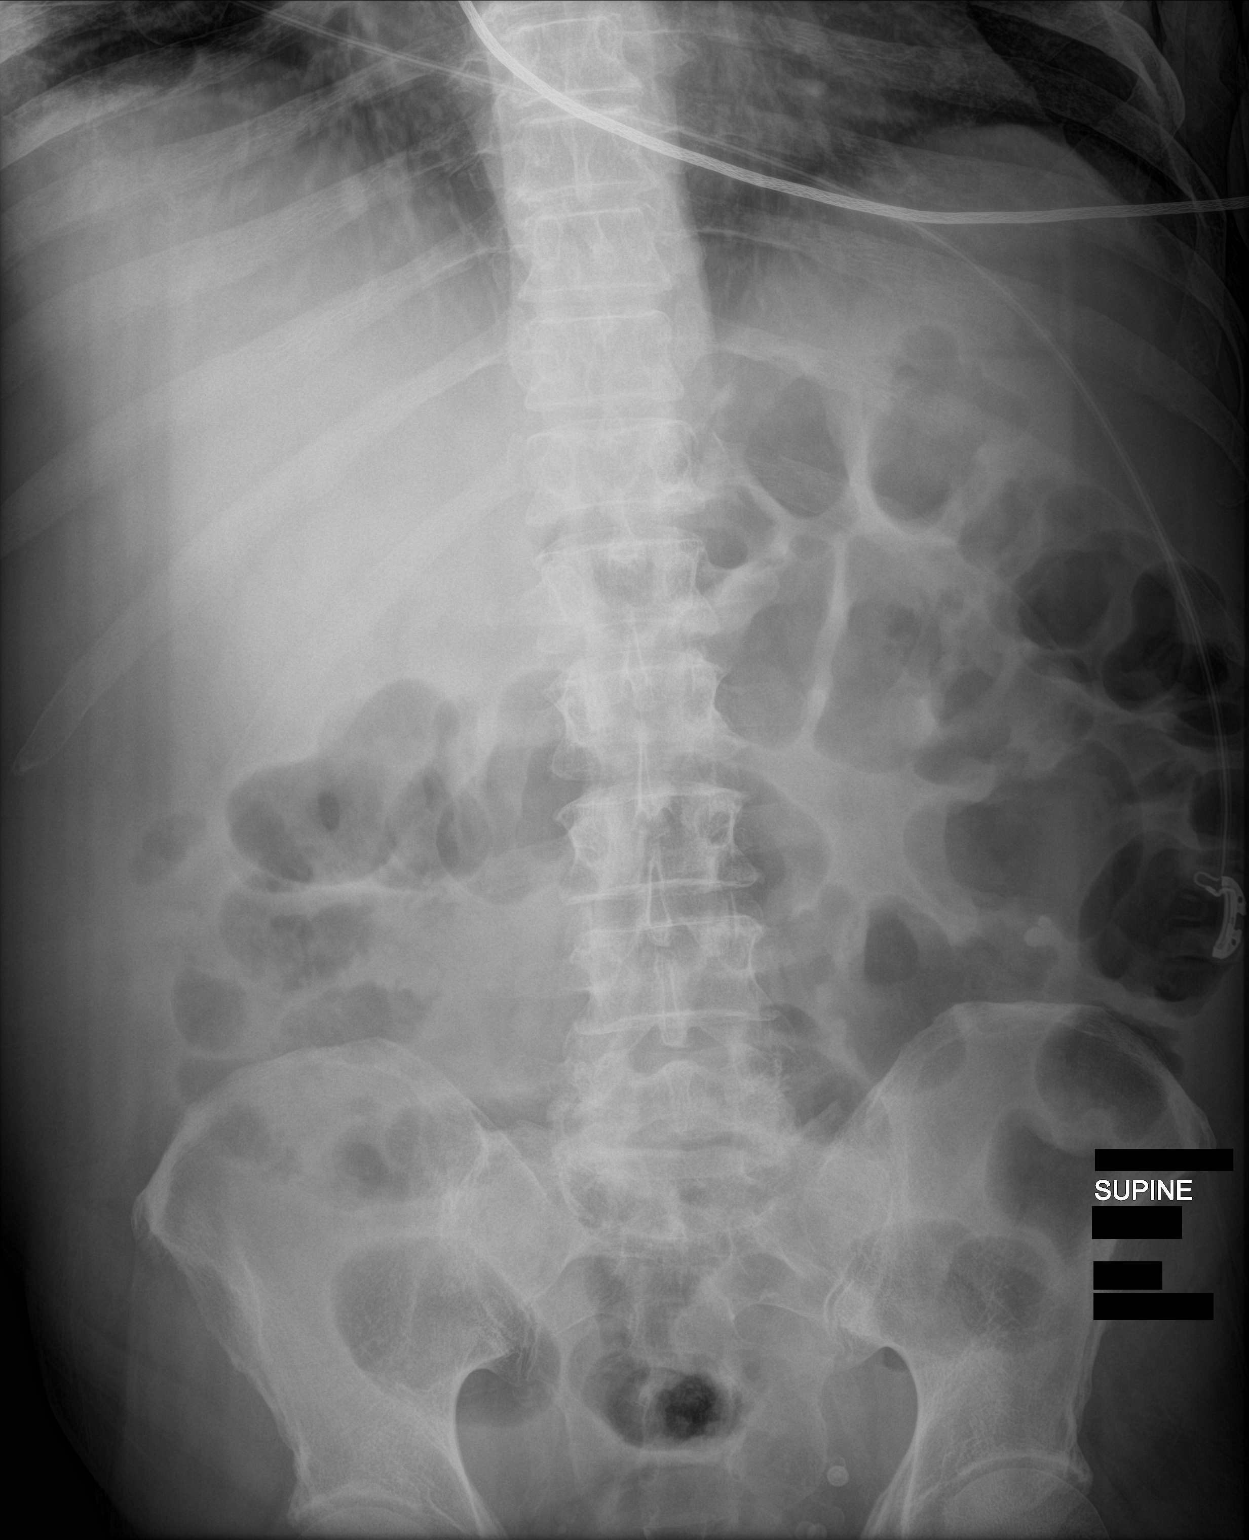

[abdomen kub (2 of 2)]
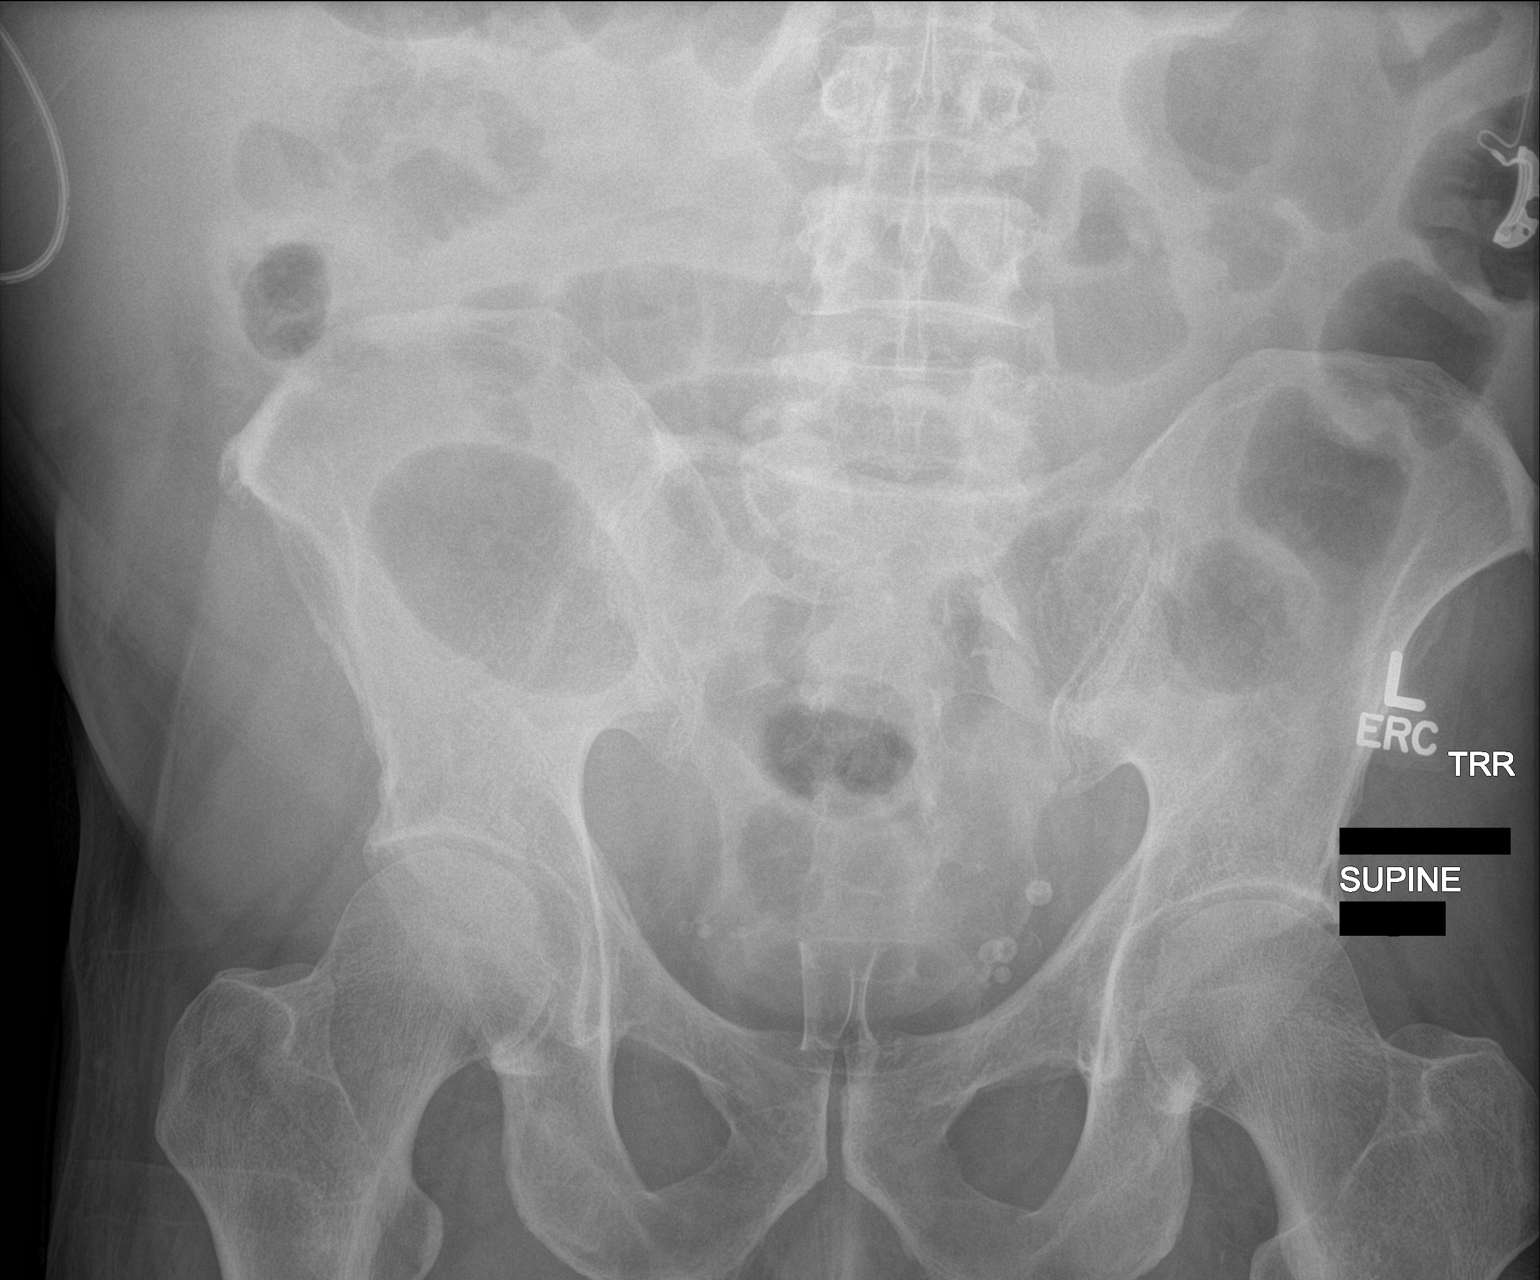

[2 of 2 positions shown; findings below may reference images not displayed]

FINDINGS: No disproportionately dilated small bowel loops. Mild-to-moderate
diffuse gaseous distention of the colon, slightly increased. No
evidence of pneumatosis or pneumoperitoneum. No pathologic soft
tissue calcifications.
IMPRESSION: 1. No evidence of small-bowel obstruction .
2. Mild colonic ileus, slightly increased.

## 2018-07-01 DIAGNOSIS — I10 Essential (primary) hypertension: Secondary | ICD-10-CM | POA: Diagnosis not present

## 2018-07-01 DIAGNOSIS — N183 Chronic kidney disease, stage 3 (moderate): Secondary | ICD-10-CM | POA: Diagnosis not present

## 2018-07-01 DIAGNOSIS — E7849 Other hyperlipidemia: Secondary | ICD-10-CM | POA: Diagnosis not present

## 2018-07-01 DIAGNOSIS — D472 Monoclonal gammopathy: Secondary | ICD-10-CM | POA: Diagnosis not present

## 2018-07-01 DIAGNOSIS — R739 Hyperglycemia, unspecified: Secondary | ICD-10-CM | POA: Diagnosis not present

## 2018-07-05 DIAGNOSIS — I8510 Secondary esophageal varices without bleeding: Secondary | ICD-10-CM | POA: Diagnosis not present

## 2018-07-05 DIAGNOSIS — D472 Monoclonal gammopathy: Secondary | ICD-10-CM | POA: Diagnosis not present

## 2018-07-05 DIAGNOSIS — I10 Essential (primary) hypertension: Secondary | ICD-10-CM | POA: Diagnosis not present

## 2018-07-05 DIAGNOSIS — R69 Illness, unspecified: Secondary | ICD-10-CM | POA: Diagnosis not present

## 2018-07-05 DIAGNOSIS — N183 Chronic kidney disease, stage 3 (moderate): Secondary | ICD-10-CM | POA: Diagnosis not present

## 2018-07-05 DIAGNOSIS — Z125 Encounter for screening for malignant neoplasm of prostate: Secondary | ICD-10-CM | POA: Diagnosis not present

## 2018-07-05 DIAGNOSIS — M5136 Other intervertebral disc degeneration, lumbar region: Secondary | ICD-10-CM | POA: Diagnosis not present

## 2018-07-05 DIAGNOSIS — R739 Hyperglycemia, unspecified: Secondary | ICD-10-CM | POA: Diagnosis not present

## 2018-07-05 DIAGNOSIS — E7849 Other hyperlipidemia: Secondary | ICD-10-CM | POA: Diagnosis not present

## 2018-07-05 DIAGNOSIS — E349 Endocrine disorder, unspecified: Secondary | ICD-10-CM | POA: Diagnosis not present

## 2018-07-12 ENCOUNTER — Telehealth: Payer: Self-pay

## 2018-07-12 ENCOUNTER — Other Ambulatory Visit: Payer: Self-pay

## 2018-07-12 DIAGNOSIS — M19012 Primary osteoarthritis, left shoulder: Secondary | ICD-10-CM | POA: Diagnosis not present

## 2018-07-12 DIAGNOSIS — M5416 Radiculopathy, lumbar region: Secondary | ICD-10-CM | POA: Diagnosis not present

## 2018-07-12 DIAGNOSIS — M25511 Pain in right shoulder: Secondary | ICD-10-CM | POA: Diagnosis not present

## 2018-07-12 DIAGNOSIS — M19011 Primary osteoarthritis, right shoulder: Secondary | ICD-10-CM | POA: Diagnosis not present

## 2018-07-12 DIAGNOSIS — K7031 Alcoholic cirrhosis of liver with ascites: Secondary | ICD-10-CM

## 2018-07-12 DIAGNOSIS — M25512 Pain in left shoulder: Secondary | ICD-10-CM | POA: Diagnosis not present

## 2018-07-12 DIAGNOSIS — G8929 Other chronic pain: Secondary | ICD-10-CM | POA: Diagnosis not present

## 2018-07-12 DIAGNOSIS — M5136 Other intervertebral disc degeneration, lumbar region: Secondary | ICD-10-CM | POA: Diagnosis not present

## 2018-07-12 NOTE — Telephone Encounter (Signed)
Left a message for patient to return my call to schedule.

## 2018-07-12 NOTE — Telephone Encounter (Signed)
Scheduled CT abd pelvis at [facility] st on 07/21/18 at 9:00am. Patient states he already has contrast. Went over instructions of CT scan and made them available on my chart. Informed patient he needs to come by our lab for repeat kidney fuctions before his scan. Patient states he will come by this week to labs.

## 2018-07-12 NOTE — Telephone Encounter (Signed)
-----   Message from Marzella Schlein, [place] sent at 07/02/2018  8:58 AM EDT -----  ----- Message ----- From: Marzella Schlein, CMA Sent: 07/02/2018 To: Marzella Schlein, CMA  We tried to scheduled pt for CT for elevated AFP now but insurance would not cover CT since he had [place] in December. Discussed with Dr. Fuller Plan and he states just change u/s to CT for June instead. So schedule CT abd pelvis at [facility]. He may have contrast still.

## 2018-07-16 DIAGNOSIS — M75101 Unspecified rotator cuff tear or rupture of right shoulder, not specified as traumatic: Secondary | ICD-10-CM | POA: Diagnosis not present

## 2018-07-16 DIAGNOSIS — M25511 Pain in right shoulder: Secondary | ICD-10-CM | POA: Diagnosis not present

## 2018-07-16 DIAGNOSIS — M19012 Primary osteoarthritis, left shoulder: Secondary | ICD-10-CM | POA: Diagnosis not present

## 2018-07-16 DIAGNOSIS — M7541 Impingement syndrome of right shoulder: Secondary | ICD-10-CM | POA: Diagnosis not present

## 2018-07-16 DIAGNOSIS — G8929 Other chronic pain: Secondary | ICD-10-CM | POA: Diagnosis not present

## 2018-07-16 DIAGNOSIS — M75102 Unspecified rotator cuff tear or rupture of left shoulder, not specified as traumatic: Secondary | ICD-10-CM | POA: Diagnosis not present

## 2018-07-16 DIAGNOSIS — M25512 Pain in left shoulder: Secondary | ICD-10-CM | POA: Diagnosis not present

## 2018-07-16 DIAGNOSIS — M7542 Impingement syndrome of left shoulder: Secondary | ICD-10-CM | POA: Diagnosis not present

## 2018-07-16 DIAGNOSIS — M19011 Primary osteoarthritis, right shoulder: Secondary | ICD-10-CM | POA: Diagnosis not present

## 2018-07-19 ENCOUNTER — Other Ambulatory Visit (INDEPENDENT_AMBULATORY_CARE_PROVIDER_SITE_OTHER): Payer: Medicare HMO

## 2018-07-19 DIAGNOSIS — R69 Illness, unspecified: Secondary | ICD-10-CM | POA: Diagnosis not present

## 2018-07-19 DIAGNOSIS — K7031 Alcoholic cirrhosis of liver with ascites: Secondary | ICD-10-CM

## 2018-07-19 LAB — CREATININE, SERUM: Creatinine, Ser: 1.14 mg/dL (ref 0.40–1.50)

## 2018-07-19 LAB — BUN: BUN: 17 mg/dL (ref 6–23)

## 2018-07-20 ENCOUNTER — Other Ambulatory Visit: Payer: Self-pay

## 2018-07-20 ENCOUNTER — Telehealth: Payer: Self-pay | Admitting: Gastroenterology

## 2018-07-20 DIAGNOSIS — K7031 Alcoholic cirrhosis of liver with ascites: Secondary | ICD-10-CM

## 2018-07-20 DIAGNOSIS — K7460 Unspecified cirrhosis of liver: Secondary | ICD-10-CM

## 2018-07-20 NOTE — Telephone Encounter (Signed)
The pt has been advised that labs are normal .

## 2018-07-21 ENCOUNTER — Other Ambulatory Visit: Payer: Self-pay

## 2018-07-21 ENCOUNTER — Ambulatory Visit: Admission: RE | Admit: 2018-07-21 | Payer: Medicare HMO | Source: Ambulatory Visit

## 2018-07-21 ENCOUNTER — Ambulatory Visit: Payer: Medicare HMO

## 2018-07-21 ENCOUNTER — Ambulatory Visit
Admission: RE | Admit: 2018-07-21 | Discharge: 2018-07-21 | Disposition: A | Discharge status: Home / Self Care | Payer: Medicare HMO | Source: Ambulatory Visit | Attending: Gastroenterology | Admitting: Gastroenterology

## 2018-07-21 DIAGNOSIS — R69 Illness, unspecified: Secondary | ICD-10-CM | POA: Diagnosis not present

## 2018-07-21 DIAGNOSIS — K7031 Alcoholic cirrhosis of liver with ascites: Secondary | ICD-10-CM

## 2018-07-21 DIAGNOSIS — N200 Calculus of kidney: Secondary | ICD-10-CM | POA: Diagnosis not present

## 2018-07-21 DIAGNOSIS — K8020 Calculus of gallbladder without cholecystitis without obstruction: Secondary | ICD-10-CM | POA: Diagnosis not present

## 2018-07-21 MED ORDER — IOHEXOL 300 MG/ML  SOLN
100.0000 mL | Freq: Once | INTRAMUSCULAR | Status: AC | PRN
  Administered 2018-07-21: 100 mL via INTRAVENOUS

## 2018-07-23 DIAGNOSIS — M5136 Other intervertebral disc degeneration, lumbar region: Secondary | ICD-10-CM | POA: Diagnosis not present

## 2018-07-23 DIAGNOSIS — M5416 Radiculopathy, lumbar region: Secondary | ICD-10-CM | POA: Diagnosis not present

## 2018-07-26 ENCOUNTER — Other Ambulatory Visit: Payer: Self-pay | Admitting: Sports Medicine

## 2018-07-26 DIAGNOSIS — G8929 Other chronic pain: Secondary | ICD-10-CM

## 2018-07-26 DIAGNOSIS — M25512 Pain in left shoulder: Secondary | ICD-10-CM

## 2018-08-06 ENCOUNTER — Other Ambulatory Visit: Payer: Self-pay | Admitting: Sports Medicine

## 2018-08-06 DIAGNOSIS — M19012 Primary osteoarthritis, left shoulder: Secondary | ICD-10-CM

## 2018-08-06 DIAGNOSIS — M19011 Primary osteoarthritis, right shoulder: Secondary | ICD-10-CM

## 2018-08-06 DIAGNOSIS — M75102 Unspecified rotator cuff tear or rupture of left shoulder, not specified as traumatic: Secondary | ICD-10-CM

## 2018-08-06 DIAGNOSIS — M25511 Pain in right shoulder: Secondary | ICD-10-CM

## 2018-08-06 DIAGNOSIS — G8929 Other chronic pain: Secondary | ICD-10-CM

## 2018-08-06 DIAGNOSIS — M75101 Unspecified rotator cuff tear or rupture of right shoulder, not specified as traumatic: Secondary | ICD-10-CM

## 2018-08-08 ENCOUNTER — Ambulatory Visit: Payer: Medicare HMO

## 2018-08-17 DIAGNOSIS — M65332 Trigger finger, left middle finger: Secondary | ICD-10-CM | POA: Diagnosis not present

## 2018-08-17 DIAGNOSIS — M65331 Trigger finger, right middle finger: Secondary | ICD-10-CM | POA: Diagnosis not present

## 2018-08-18 ENCOUNTER — Other Ambulatory Visit: Payer: Self-pay

## 2018-08-18 ENCOUNTER — Ambulatory Visit
Admission: RE | Admit: 2018-08-18 | Discharge: 2018-08-18 | Disposition: A | Discharge status: Home / Self Care | Payer: Medicare HMO | Source: Ambulatory Visit | Attending: Sports Medicine | Admitting: Sports Medicine

## 2018-08-18 DIAGNOSIS — M19012 Primary osteoarthritis, left shoulder: Secondary | ICD-10-CM | POA: Diagnosis present

## 2018-08-18 DIAGNOSIS — G8929 Other chronic pain: Secondary | ICD-10-CM | POA: Diagnosis present

## 2018-08-18 DIAGNOSIS — M75101 Unspecified rotator cuff tear or rupture of right shoulder, not specified as traumatic: Secondary | ICD-10-CM | POA: Insufficient documentation

## 2018-08-18 DIAGNOSIS — M25512 Pain in left shoulder: Secondary | ICD-10-CM | POA: Diagnosis present

## 2018-08-18 DIAGNOSIS — M75102 Unspecified rotator cuff tear or rupture of left shoulder, not specified as traumatic: Secondary | ICD-10-CM | POA: Diagnosis present

## 2018-08-18 DIAGNOSIS — M25511 Pain in right shoulder: Secondary | ICD-10-CM | POA: Diagnosis not present

## 2018-08-18 DIAGNOSIS — S46011A Strain of muscle(s) and tendon(s) of the rotator cuff of right shoulder, initial encounter: Secondary | ICD-10-CM | POA: Diagnosis not present

## 2018-08-18 DIAGNOSIS — M19011 Primary osteoarthritis, right shoulder: Secondary | ICD-10-CM

## 2018-08-18 DIAGNOSIS — S46012A Strain of muscle(s) and tendon(s) of the rotator cuff of left shoulder, initial encounter: Secondary | ICD-10-CM | POA: Diagnosis not present

## 2018-08-31 ENCOUNTER — Telehealth: Payer: Self-pay

## 2018-08-31 DIAGNOSIS — M75120 Complete rotator cuff tear or rupture of unspecified shoulder, not specified as traumatic: Secondary | ICD-10-CM | POA: Diagnosis not present

## 2018-08-31 NOTE — Telephone Encounter (Signed)
Left a message for patient to return my call. 

## 2018-09-02 ENCOUNTER — Other Ambulatory Visit: Payer: Self-pay

## 2018-09-02 DIAGNOSIS — K7031 Alcoholic cirrhosis of liver with ascites: Secondary | ICD-10-CM

## 2018-09-02 DIAGNOSIS — I8500 Esophageal varices without bleeding: Secondary | ICD-10-CM

## 2018-09-02 NOTE — Telephone Encounter (Signed)
Scheduled patient for EGD with possible banding on 10/11/18 at 9:45am. Instructions will be put on my chart to review. Patient agreed and will call if he has any questions.   Called patient back and left a voicemail stating that he has to get Covid-19 screened prior to procedure date but I will put those instructions on my chart as well and if he has any questions to call me back.

## 2018-09-02 NOTE — Telephone Encounter (Signed)
Left message for patient to return my call.

## 2018-09-08 ENCOUNTER — Telehealth: Payer: Self-pay | Admitting: Gastroenterology

## 2018-09-08 NOTE — Telephone Encounter (Signed)
Informed patient that he can do his testing at [facility] testing site. Rescheduled patient to [facility] pre admission testing center and notified patient its on the same day as previously scheduled on 10/07/18. Patient verbalized understanding.

## 2018-09-08 NOTE — Telephone Encounter (Signed)
Pt called regarding test screening for Covid-19 before his procedure. He lives in [place] and wants to know if there is a location closer to his house that he can go to.

## 2018-10-06 ENCOUNTER — Telehealth: Payer: Self-pay | Admitting: Gastroenterology

## 2018-10-06 NOTE — Progress Notes (Signed)
Called patient on his home phone. Left message to call back at [phone]

## 2018-10-06 NOTE — Telephone Encounter (Signed)
Patient states he couldn't see his instructions on my chart for the EGD at [facility] on 10/11/18. Informed patient that I released it to my chart and is unsure why he cannot see the instructions. Went over detailed instructions with patient of procedure on 10/11/18 and the covid testing for patient for tomorrow at [facility]. Patient verbalized understanding.

## 2018-10-06 NOTE — Telephone Encounter (Signed)
Pt requested a call back to discuss details of hospital procedure 10/11/18.

## 2018-10-06 NOTE — Telephone Encounter (Signed)
Left message for patient to return my call.

## 2018-10-07 ENCOUNTER — Other Ambulatory Visit
Admission: RE | Admit: 2018-10-07 | Discharge: 2018-10-07 | Disposition: A | Discharge status: Home / Self Care | Payer: Medicare HMO | Source: Ambulatory Visit | Attending: Gastroenterology | Admitting: Gastroenterology

## 2018-10-07 ENCOUNTER — Other Ambulatory Visit: Payer: Self-pay

## 2018-10-07 ENCOUNTER — Other Ambulatory Visit (HOSPITAL_COMMUNITY): Payer: Medicare HMO

## 2018-10-07 DIAGNOSIS — Z01812 Encounter for preprocedural laboratory examination: Secondary | ICD-10-CM | POA: Insufficient documentation

## 2018-10-07 DIAGNOSIS — Z20828 Contact with and (suspected) exposure to other viral communicable diseases: Secondary | ICD-10-CM | POA: Insufficient documentation

## 2018-10-07 LAB — SARS CORONAVIRUS 2 (TAT 6-24 HRS): SARS Coronavirus 2: NEGATIVE

## 2018-10-11 ENCOUNTER — Other Ambulatory Visit: Payer: Self-pay

## 2018-10-11 ENCOUNTER — Encounter (HOSPITAL_COMMUNITY)
Admission: RE | Disposition: A | Discharge status: Home / Self Care | Payer: Self-pay | Source: Home / Self Care | Attending: Gastroenterology

## 2018-10-11 ENCOUNTER — Ambulatory Visit (HOSPITAL_COMMUNITY): Payer: Medicare HMO | Admitting: Registered Nurse

## 2018-10-11 ENCOUNTER — Encounter (HOSPITAL_COMMUNITY): Payer: Self-pay | Admitting: Registered Nurse

## 2018-10-11 ENCOUNTER — Ambulatory Visit (HOSPITAL_COMMUNITY)
Admission: RE | Admit: 2018-10-11 | Discharge: 2018-10-11 | Disposition: A | Discharge status: Home / Self Care | Payer: Medicare HMO | Attending: Gastroenterology | Admitting: Gastroenterology

## 2018-10-11 DIAGNOSIS — K219 Gastro-esophageal reflux disease without esophagitis: Secondary | ICD-10-CM | POA: Diagnosis not present

## 2018-10-11 DIAGNOSIS — M1990 Unspecified osteoarthritis, unspecified site: Secondary | ICD-10-CM | POA: Insufficient documentation

## 2018-10-11 DIAGNOSIS — K7031 Alcoholic cirrhosis of liver with ascites: Secondary | ICD-10-CM | POA: Insufficient documentation

## 2018-10-11 DIAGNOSIS — K3189 Other diseases of stomach and duodenum: Secondary | ICD-10-CM | POA: Diagnosis not present

## 2018-10-11 DIAGNOSIS — K766 Portal hypertension: Secondary | ICD-10-CM | POA: Insufficient documentation

## 2018-10-11 DIAGNOSIS — I8500 Esophageal varices without bleeding: Secondary | ICD-10-CM | POA: Diagnosis not present

## 2018-10-11 DIAGNOSIS — I10 Essential (primary) hypertension: Secondary | ICD-10-CM | POA: Insufficient documentation

## 2018-10-11 DIAGNOSIS — I8510 Secondary esophageal varices without bleeding: Secondary | ICD-10-CM | POA: Diagnosis not present

## 2018-10-11 DIAGNOSIS — Z86010 Personal history of colonic polyps: Secondary | ICD-10-CM | POA: Insufficient documentation

## 2018-10-11 DIAGNOSIS — Z882 Allergy status to sulfonamides status: Secondary | ICD-10-CM | POA: Insufficient documentation

## 2018-10-11 DIAGNOSIS — J45909 Unspecified asthma, uncomplicated: Secondary | ICD-10-CM | POA: Diagnosis not present

## 2018-10-11 DIAGNOSIS — R69 Illness, unspecified: Secondary | ICD-10-CM | POA: Diagnosis not present

## 2018-10-11 DIAGNOSIS — Z87891 Personal history of nicotine dependence: Secondary | ICD-10-CM | POA: Diagnosis not present

## 2018-10-11 DIAGNOSIS — N289 Disorder of kidney and ureter, unspecified: Secondary | ICD-10-CM | POA: Diagnosis not present

## 2018-10-11 DIAGNOSIS — Z8673 Personal history of transient ischemic attack (TIA), and cerebral infarction without residual deficits: Secondary | ICD-10-CM | POA: Diagnosis not present

## 2018-10-11 DIAGNOSIS — E785 Hyperlipidemia, unspecified: Secondary | ICD-10-CM | POA: Diagnosis not present

## 2018-10-11 HISTORY — PX: ESOPHAGOGASTRODUODENOSCOPY (EGD) WITH PROPOFOL: SHX5813

## 2018-10-11 HISTORY — PX: ESOPHAGEAL BANDING: SHX5518

## 2018-10-11 SURGERY — ESOPHAGOGASTRODUODENOSCOPY (EGD) WITH PROPOFOL
Anesthesia: Monitor Anesthesia Care

## 2018-10-11 MED ORDER — LACTATED RINGERS IV SOLN
INTRAVENOUS | Status: DC | PRN
  Administered 2018-10-11: 09:00:00 via INTRAVENOUS

## 2018-10-11 MED ORDER — GLYCOPYRROLATE 0.2 MG/ML IJ SOLN
INTRAMUSCULAR | Status: DC | PRN
  Administered 2018-10-11: 0.1 mg via INTRAVENOUS

## 2018-10-11 MED ORDER — ONDANSETRON HCL 4 MG/2ML IJ SOLN
INTRAMUSCULAR | Status: DC | PRN
  Administered 2018-10-11: 4 mg via INTRAVENOUS

## 2018-10-11 MED ORDER — PROPOFOL 10 MG/ML IV BOLUS
INTRAVENOUS | Status: AC
  Filled 2018-10-11: qty 40

## 2018-10-11 MED ORDER — PROPOFOL 500 MG/50ML IV EMUL
INTRAVENOUS | Status: DC | PRN
  Administered 2018-10-11: 250 ug/kg/min via INTRAVENOUS

## 2018-10-11 MED ORDER — LACTATED RINGERS IV SOLN
INTRAVENOUS | Status: AC | PRN
  Administered 2018-10-11: 1000 mL via INTRAVENOUS

## 2018-10-11 MED ORDER — SODIUM CHLORIDE 0.9 % IV SOLN: INTRAVENOUS | Status: DC

## 2018-10-11 MED ORDER — PROPOFOL 10 MG/ML IV BOLUS
INTRAVENOUS | Status: AC
  Filled 2018-10-11: qty 20

## 2018-10-11 MED ORDER — LIDOCAINE HCL (CARDIAC) PF 100 MG/5ML IV SOSY
PREFILLED_SYRINGE | INTRAVENOUS | Status: DC | PRN
  Administered 2018-10-11: 75 mg via INTRAVENOUS

## 2018-10-11 MED ORDER — PHENYLEPHRINE HCL (PRESSORS) 10 MG/ML IV SOLN
INTRAVENOUS | Status: DC | PRN
  Administered 2018-10-11: 80 ug via INTRAVENOUS
  Administered 2018-10-11: 120 ug via INTRAVENOUS

## 2018-10-11 SURGICAL SUPPLY — 15 items

## 2018-10-11 NOTE — H&P (Signed)
History of Present Illness: This is a 68 year old male with decompensated alcoholic cirrhosis with ascites, esophageal varices, portal gastropathy, HE.  He has not GI complaints. Weight is stable within a few pounds. He is closely followed by his PCP Ramonita Lab, MD at [facility]. Grade 2 esophageal varices banded in August 2019.  Abdominal ultrasound in December 2019 showed gallstones and a slightly prominent gallbladder wall, cirrhosis, no focal hepatic lesions.  Current Medications, Allergies, Past Medical History, Past Surgical History, Family History and Social History were reviewed in [employer] record.  Physical Exam: General: Well developed, well nourished, no acute distress Head: Normocephalic and atraumatic Eyes:  sclerae anicteric, EOMI Ears: Normal auditory acuity Mouth: No deformity or lesions Lungs: Clear throughout to auscultation Heart: Regular rate and rhythm; no murmurs, rubs or bruits Abdomen: Soft, non tender and distended. No masses, hepatosplenomegaly or hernias noted. Normal Bowel sounds Rectal: Not done Musculoskeletal: Symmetrical with no gross deformities  Pulses:  Normal pulses noted Extremities: No clubbing, cyanosis, edema or deformities noted Neurological: Alert oriented x 4, grossly nonfocal Psychological:  Alert and cooperative. Normal mood and affect   Assessment and Recommendations:  1.  Decompensated alcoholic cirrhosis with ascites, esophageal varices, portal gastropathy, HE.  PT/INR, ammonia, AFP today.  Continue nadolol 20 mg daily, Lasix 20 mg daily, Aldactone 50 mg daily, Xifaxan 550 mg twice daily, FeSO4 daily. Schedule EGD for surveillance of varices. The risks (including bleeding, perforation, infection, missed lesions, medication reactions and possible hospitalization or surgery if complications occur), benefits, and alternatives to endoscopy with possible biopsy and possible dilation were discussed with the patient and they  consent to proceed.   2.  Personal history of 2 small adenomatous colon polyp in 2008. Colonoscopy in 2015 was negative for precancerous polyps. Per guidelines a 10 year interval colonoscopy is recommended in 06/2023.

## 2018-10-11 NOTE — Op Note (Signed)
[facility] Patient Name: Jeff Ward Procedure Date: 10/11/2018 MRN: [id number] Attending MD: Ladene Artist , MD Date of Birth: 1950/09/15 CSN: [id number] Age: 68 Admit Type: Inpatient Procedure:                Upper GI endoscopy Indications:              Surveillance procedure, 1st degree variceal                            eradication (no prior bleeding) Providers:                Pricilla Riffle. Fuller Plan, MD, Cleda Daub, RN, Ladona Ridgel, Technician Referring MD:             Ramonita Lab, MD Medicines:                Monitored Anesthesia Care Complications:            No immediate complications. Estimated Blood Loss:     Estimated blood loss: none. Procedure:                Pre-Anesthesia Assessment:                           - Prior to the procedure, a History and Physical                            was performed, and patient medications and                            allergies were reviewed. The patient's tolerance of                            previous anesthesia was also reviewed. The risks                            and benefits of the procedure and the sedation                            options and risks were discussed with the patient.                            All questions were answered, and informed consent                            was obtained. Prior Anticoagulants: The patient has                            taken no previous anticoagulant or antiplatelet                            agents. ASA Grade Assessment: III - A patient with  severe systemic disease. After reviewing the risks                            and benefits, the patient was deemed in                            satisfactory condition to undergo the procedure.                           After obtaining informed consent, the endoscope was                            passed under direct vision. Throughout the   procedure, the patient's blood pressure, pulse, and                            oxygen saturations were monitored continuously. The                            [id number]-[id number] Olympus gastroscope was                            introduced through the mouth, and advanced to the                            second part of duodenum. The upper GI endoscopy was                            accomplished without difficulty. The patient                            tolerated the procedure well. Scope In: Scope Out: Findings:      Two short columns of non-bleeding grade II varices were found in the       distal esophagus,. They were 5 mm in largest diameter. No stigmata of       recent bleeding were evident and no red wale signs were present.       Scarring from prior treatment was visible. Evidence of partial       eradication was visible. Two bands were successfully placed with       complete eradication, resulting in deflation of varices. There was no       bleeding during the procedure.      The exam of the esophagus was otherwise normal.      A medium amount of food (residue) was found in the gastric fundus and in       the gastric body.      Moderate portal hypertensive gastropathy was found in the entire       examined stomach.      The exam of the stomach was otherwise normal.      The duodenal bulb and second portion of the duodenum were normal. Impression:               - Non-bleeding grade II esophageal varices.  Completely eradicated. Banded.                           - A medium amount of food (residue) in the stomach.                           - Portal hypertensive gastropathy.                           - Normal duodenal bulb and second portion of the                            duodenum.                           - No specimens collected. Moderate Sedation:      Not Applicable - Patient had care per Anesthesia. Recommendation:           - Patient has a contact  number available for                            emergencies. The signs and symptoms of potential                            delayed complications were discussed with the                            patient. Return to normal activities tomorrow.                            Written discharge instructions were provided to the                            patient.                           - Resume previous diet.                           - Continue present medications.                           - No aspirin, ibuprofen, naproxen, or other                            non-steroidal anti-inflammatory drugs for 2 weeks.                           - Repeat upper endoscopy in 1 year for surveillance. Procedure Code(s):        --- Professional ---                           [phone], Esophagogastroduodenoscopy, flexible,                            transoral; with band ligation of esophageal/gastric  varices Diagnosis Code(s):        --- Professional ---                           I85.00, Esophageal varices without bleeding                           K76.6, Portal hypertension                           K31.89, Other diseases of stomach and duodenum CPT copyright 2019 American Medical Association. All rights reserved. The codes documented in this report are preliminary and upon coder review may  be revised to meet current compliance requirements. Ladene Artist, MD 10/11/2018 10:11:06 AM This report has been signed electronically. Number of Addenda: 0

## 2018-10-11 NOTE — Anesthesia Postprocedure Evaluation (Signed)
Anesthesia Post Note  Patient: Jeff Ward  Procedure(s) Performed: ESOPHAGOGASTRODUODENOSCOPY (EGD) WITH PROPOFOL (N/A ) ESOPHAGEAL BANDING     Patient location during evaluation: PACU Anesthesia Type: MAC Level of consciousness: awake and alert Pain management: pain level controlled Vital Signs Assessment: post-procedure vital signs reviewed and stable Respiratory status: spontaneous breathing and respiratory function stable Cardiovascular status: stable Postop Assessment: no apparent nausea or vomiting Anesthetic complications: no    Last Vitals:  Vitals:   10/11/18 1015 10/11/18 1020  BP: 114/72 (!) 149/72  Pulse: 69 61  Resp: (!) 21 14  Temp:    SpO2: 93% 96%    Last Pain:  Vitals:   10/11/18 1020  TempSrc:   PainSc: 0-No pain                 Randeep Biondolillo DANIEL

## 2018-10-11 NOTE — Transfer of Care (Signed)
Immediate Anesthesia Transfer of Care Note  Patient: Jeff Ward  Procedure(s) Performed: ESOPHAGOGASTRODUODENOSCOPY (EGD) WITH PROPOFOL (N/A ) ESOPHAGEAL BANDING  Patient Location: PACU  Anesthesia Type:MAC  Level of Consciousness: awake, alert , oriented and patient cooperative  Airway & Oxygen Therapy: Patient Spontanous Breathing and Patient connected to face mask oxygen  Post-op Assessment: Report given to RN, Post -op Vital signs reviewed and stable and Patient moving all extremities X 4  Post vital signs: stable  Last Vitals:  Vitals Value Taken Time  BP 99/63 10/11/18 1010  Temp 36.4 C 10/11/18 1009  Pulse 62 10/11/18 1011  Resp 20 10/11/18 1011  SpO2 95 % 10/11/18 1011  Vitals shown include unvalidated device data.  Last Pain:  Vitals:   10/11/18 1009  TempSrc: Oral  PainSc: 0-No pain         Complications: No apparent anesthesia complications

## 2018-10-11 NOTE — Discharge Instructions (Signed)
YOU HAD AN ENDOSCOPIC PROCEDURE TODAY: Refer to the procedure report and other information in the discharge instructions given to you for any specific questions about what was found during the examination. If this information does not answer your questions, please call [place] office at [phone] to clarify.   YOU SHOULD EXPECT: Some feelings of bloating in the abdomen. Passage of more gas than usual. Walking can help get rid of the air that was put into your GI tract during the procedure and reduce the bloating. If you had a lower endoscopy (such as a colonoscopy or flexible sigmoidoscopy) you may notice spotting of blood in your stool or on the toilet paper. Some abdominal soreness may be present for a day or two, also.  DIET: Your first meal following the procedure should be a light meal and then it is ok to progress to your normal diet. A half-sandwich or bowl of soup is an example of a good first meal. Heavy or fried foods are harder to digest and may make you feel nauseous or bloated. Drink plenty of fluids but you should avoid alcoholic beverages for 24 hours. If you had a esophageal dilation, please see attached instructions for diet.    ACTIVITY: Your care partner should take you home directly after the procedure. You should plan to take it easy, moving slowly for the rest of the day. You can resume normal activity the day after the procedure however YOU SHOULD NOT DRIVE, use power tools, machinery or perform tasks that involve climbing or major physical exertion for 24 hours (because of the sedation medicines used during the test).   SYMPTOMS TO REPORT IMMEDIATELY: A gastroenterologist can be reached at any hour. Please call [phone]  for any of the following symptoms:   Following upper endoscopy (EGD, EUS, ERCP, esophageal dilation) Vomiting of blood or coffee ground material  New, significant abdominal pain  New, significant chest pain or pain under the shoulder blades  Painful or  persistently difficult swallowing  New shortness of breath  Black, tarry-looking or red, bloody stools  FOLLOW UP:  If any biopsies were taken you will be contacted by phone or by letter within the next 1-3 weeks. Call [phone]  if you have not heard about the biopsies in 3 weeks.  Please also call with any specific questions about appointments or follow up tests.  

## 2018-10-11 NOTE — Anesthesia Preprocedure Evaluation (Addendum)
Anesthesia Evaluation  Patient identified by MRN, date of birth, ID band Patient awake    Reviewed: Allergy & Precautions, NPO status , Patient's Chart, lab work & pertinent test results  History of Anesthesia Complications (+) history of anesthetic complications  Airway Mallampati: II  TM Distance: >3 FB Neck ROM: Full    Dental  (+) Edentulous Upper, Dental Advisory Given   Pulmonary neg pulmonary ROS, former smoker,    Pulmonary exam normal        Cardiovascular hypertension, negative cardio ROS Normal cardiovascular exam     Neuro/Psych Anxiety Depression CVA    GI/Hepatic negative GI ROS, hiatal hernia, PUD, GERD  ,(+) Cirrhosis   ascites  substance abuse  alcohol use,   Endo/Other  negative endocrine ROS  Renal/GU Renal InsufficiencyRenal disease  negative genitourinary   Musculoskeletal negative musculoskeletal ROS (+)   Abdominal   Peds negative pediatric ROS (+)  Hematology negative hematology ROS (+)   Anesthesia Other Findings   Reproductive/Obstetrics negative OB ROS                           Anesthesia Physical  Anesthesia Plan  ASA: IV  Anesthesia Plan: MAC   Post-op Pain Management:    Induction: Intravenous  PONV Risk Score and Plan: Propofol infusion and Ondansetron  Airway Management Planned: Simple Face Mask  Additional Equipment:   Intra-op Plan:   Post-operative Plan:   Informed Consent: I have reviewed the patients History and Physical, chart, labs and discussed the procedure including the risks, benefits and alternatives for the proposed anesthesia with the patient or authorized representative who has indicated his/her understanding and acceptance.     Dental advisory given  Plan Discussed with: Anesthesiologist and CRNA  Anesthesia Plan Comments:        Anesthesia Quick Evaluation

## 2018-10-12 ENCOUNTER — Encounter (HOSPITAL_COMMUNITY): Payer: Self-pay | Admitting: Gastroenterology

## 2018-11-03 DIAGNOSIS — M5136 Other intervertebral disc degeneration, lumbar region: Secondary | ICD-10-CM | POA: Diagnosis not present

## 2018-11-03 DIAGNOSIS — E7849 Other hyperlipidemia: Secondary | ICD-10-CM | POA: Diagnosis not present

## 2018-11-03 DIAGNOSIS — D472 Monoclonal gammopathy: Secondary | ICD-10-CM | POA: Diagnosis not present

## 2018-11-03 DIAGNOSIS — R69 Illness, unspecified: Secondary | ICD-10-CM | POA: Diagnosis not present

## 2018-11-03 DIAGNOSIS — N183 Chronic kidney disease, stage 3 unspecified: Secondary | ICD-10-CM | POA: Diagnosis not present

## 2018-11-03 DIAGNOSIS — Z125 Encounter for screening for malignant neoplasm of prostate: Secondary | ICD-10-CM | POA: Diagnosis not present

## 2018-11-03 DIAGNOSIS — R739 Hyperglycemia, unspecified: Secondary | ICD-10-CM | POA: Diagnosis not present

## 2018-11-05 DIAGNOSIS — E7849 Other hyperlipidemia: Secondary | ICD-10-CM | POA: Diagnosis not present

## 2018-11-05 DIAGNOSIS — Z0000 Encounter for general adult medical examination without abnormal findings: Secondary | ICD-10-CM | POA: Diagnosis not present

## 2018-11-05 DIAGNOSIS — I129 Hypertensive chronic kidney disease with stage 1 through stage 4 chronic kidney disease, or unspecified chronic kidney disease: Secondary | ICD-10-CM | POA: Diagnosis not present

## 2018-11-05 DIAGNOSIS — E349 Endocrine disorder, unspecified: Secondary | ICD-10-CM | POA: Diagnosis not present

## 2018-11-05 DIAGNOSIS — N183 Chronic kidney disease, stage 3 unspecified: Secondary | ICD-10-CM | POA: Diagnosis not present

## 2018-11-05 DIAGNOSIS — I8510 Secondary esophageal varices without bleeding: Secondary | ICD-10-CM | POA: Diagnosis not present

## 2018-11-05 DIAGNOSIS — Z23 Encounter for immunization: Secondary | ICD-10-CM | POA: Diagnosis not present

## 2018-11-05 DIAGNOSIS — M5136 Other intervertebral disc degeneration, lumbar region: Secondary | ICD-10-CM | POA: Diagnosis not present

## 2018-11-05 DIAGNOSIS — D472 Monoclonal gammopathy: Secondary | ICD-10-CM | POA: Diagnosis not present

## 2018-11-05 DIAGNOSIS — R69 Illness, unspecified: Secondary | ICD-10-CM | POA: Diagnosis not present

## 2018-11-10 DIAGNOSIS — L918 Other hypertrophic disorders of the skin: Secondary | ICD-10-CM | POA: Diagnosis not present

## 2018-11-10 DIAGNOSIS — L570 Actinic keratosis: Secondary | ICD-10-CM | POA: Diagnosis not present

## 2018-12-17 ENCOUNTER — Encounter: Payer: Self-pay | Admitting: Gastroenterology

## 2018-12-17 ENCOUNTER — Telehealth: Payer: Self-pay | Admitting: Gastroenterology

## 2018-12-17 NOTE — Telephone Encounter (Signed)
Patient states he is trying to fix his printer at home to print out his patient assistance paperwork. Asked patient if there is anything I can do to help. Patient states he will either get the printer fixed or he will call the company to mail him the forms. Patient states he will contact me back once he has the forms he can drop them off with me to fill out.

## 2018-12-20 NOTE — Telephone Encounter (Signed)
Pt called stating that he left you the paperwork. He stated that you knew what he was talking about.

## 2018-12-20 NOTE — Telephone Encounter (Signed)
Informed patient I have not received the paperwork yet and who he dropped the paperwork off with. Patient states he mailed the paperwork Saturday and we should received it any day. Informed patient that I will let him know when we receive the paperwork. Patient states he has already done his part of the patient assistance so it's just waiting on our part. Informed patient that I will keep him updated.

## 2018-12-21 DIAGNOSIS — D472 Monoclonal gammopathy: Secondary | ICD-10-CM | POA: Diagnosis not present

## 2018-12-21 DIAGNOSIS — Z87891 Personal history of nicotine dependence: Secondary | ICD-10-CM | POA: Diagnosis not present

## 2018-12-21 DIAGNOSIS — I129 Hypertensive chronic kidney disease with stage 1 through stage 4 chronic kidney disease, or unspecified chronic kidney disease: Secondary | ICD-10-CM | POA: Diagnosis not present

## 2018-12-21 DIAGNOSIS — N183 Chronic kidney disease, stage 3 unspecified: Secondary | ICD-10-CM | POA: Diagnosis not present

## 2018-12-21 DIAGNOSIS — M5136 Other intervertebral disc degeneration, lumbar region: Secondary | ICD-10-CM | POA: Diagnosis not present

## 2018-12-21 DIAGNOSIS — E785 Hyperlipidemia, unspecified: Secondary | ICD-10-CM | POA: Diagnosis not present

## 2018-12-21 DIAGNOSIS — R739 Hyperglycemia, unspecified: Secondary | ICD-10-CM | POA: Diagnosis not present

## 2018-12-21 DIAGNOSIS — R69 Illness, unspecified: Secondary | ICD-10-CM | POA: Diagnosis not present

## 2018-12-21 DIAGNOSIS — I8510 Secondary esophageal varices without bleeding: Secondary | ICD-10-CM | POA: Diagnosis not present

## 2018-12-27 NOTE — Telephone Encounter (Signed)
Left message for patient to inform him I still have not received paper work for patient assistance.

## 2018-12-28 NOTE — Telephone Encounter (Signed)
Received faxed patient assistance forms for Xifaxan. Faxed patient assistance forms. Waiting on response.

## 2019-01-05 ENCOUNTER — Telehealth: Payer: Self-pay | Admitting: Gastroenterology

## 2019-01-05 NOTE — Telephone Encounter (Signed)
Returned call at phone number provided and automated system states user is on the telephone, leave a a message. Then automated system states cannot leave a message because users mailbox is full.   Patients xifaxan start date is 04/01/2015.

## 2019-01-06 NOTE — Telephone Encounter (Signed)
Called phone number provided and number went to voicemail and could not leave a message.

## 2019-01-06 NOTE — Telephone Encounter (Signed)
Called phone number provided for [facility] and number is busy and could not leave a message due to mailbox is full.

## 2019-01-08 DIAGNOSIS — R69 Illness, unspecified: Secondary | ICD-10-CM | POA: Diagnosis not present

## 2019-01-10 NOTE — Telephone Encounter (Signed)
Called phone number with no answer and went to voicemail but could not leave a message since mailbox is full. Will wait for company to call me back.

## 2019-02-07 DIAGNOSIS — M5136 Other intervertebral disc degeneration, lumbar region: Secondary | ICD-10-CM | POA: Diagnosis not present

## 2019-02-07 DIAGNOSIS — M5416 Radiculopathy, lumbar region: Secondary | ICD-10-CM | POA: Diagnosis not present

## 2019-02-24 ENCOUNTER — Telehealth: Payer: Self-pay | Admitting: Gastroenterology

## 2019-02-24 DIAGNOSIS — H9312 Tinnitus, left ear: Secondary | ICD-10-CM | POA: Diagnosis not present

## 2019-02-24 DIAGNOSIS — H903 Sensorineural hearing loss, bilateral: Secondary | ICD-10-CM | POA: Diagnosis not present

## 2019-02-24 NOTE — Telephone Encounter (Signed)
Pt stated that "insurance is refusing to pay for Xifaxan."

## 2019-02-25 DIAGNOSIS — H04123 Dry eye syndrome of bilateral lacrimal glands: Secondary | ICD-10-CM | POA: Diagnosis not present

## 2019-02-25 DIAGNOSIS — H2513 Age-related nuclear cataract, bilateral: Secondary | ICD-10-CM | POA: Diagnosis not present

## 2019-02-25 DIAGNOSIS — H35033 Hypertensive retinopathy, bilateral: Secondary | ICD-10-CM | POA: Diagnosis not present

## 2019-02-25 NOTE — Telephone Encounter (Signed)
Patient states he received a letter stating patient assistance will not approve his Xifaxan due to him having insurance that will cover it. Patient states he has contacted [place] because his insurance will not cover Xifaxan and it's $1300. Patient states he will contact me once he has heard back from his insurance company. Also offered patient samples of Xifaxan if he is about to run out. Patient states he has a week left of medication but will let me know about the samples.

## 2019-03-02 ENCOUNTER — Telehealth: Payer: Self-pay | Admitting: Gastroenterology

## 2019-03-02 NOTE — Telephone Encounter (Signed)
[employer] approved Xifaxan from 01/28/19-08/29/19. Patient notified and verbalized understanding. Patient states he is waiting now to see how much Xifaxan will cost since his insurance will approve it now and may have to appeal his patient assistance.

## 2019-03-02 NOTE — Telephone Encounter (Signed)
Patient states Jeff Ward needs me to contact them and answer questions regarding his diagnosis and xifaxan. # [phone].

## 2019-03-02 NOTE — Telephone Encounter (Signed)
[employer] and they state they reached out to our office by fax or telephone several weeks ago regarding the PA process for Xifaxan and needed questions answered. Since they did not here back from our office they denied the medication. Informed [employer] that we never received anything from them. Confirmed phone and fax number that [employer] has on file which is not accurate. Gave them updated information. [employer] states they will have to start the appeal process. Answered questions regarding xifaxan and patient's diagnosis and [employer] states the appeal will be in review. Case ID # [id number] Left message for patient to return my call.

## 2019-03-02 NOTE — Telephone Encounter (Signed)
Patient states [employer] told him that they faxed something to our office to be filled out and faxed back regarding the reason he is taking Xifaxan. Informed patient I have not received anything from his insurance company. Patient states he will call [employer] back and give them my fax number to have the form directly faxed to me.

## 2019-03-04 NOTE — Telephone Encounter (Signed)
Patient is calling back says he's been speaking with you about this for a few days now.

## 2019-03-04 NOTE — Telephone Encounter (Signed)
Patient states Jeff Ward called him and quoted him the price of Xifaxan of $976. Patient is going to appeal the patient assistance program and let our office know what our office needs to do after he finds out what he needs to get this completed.

## 2019-03-18 ENCOUNTER — Other Ambulatory Visit: Payer: Self-pay | Admitting: Otolaryngology

## 2019-03-18 DIAGNOSIS — H9312 Tinnitus, left ear: Secondary | ICD-10-CM

## 2019-03-31 ENCOUNTER — Other Ambulatory Visit: Payer: Self-pay

## 2019-03-31 ENCOUNTER — Ambulatory Visit
Admission: RE | Admit: 2019-03-31 | Discharge: 2019-03-31 | Disposition: A | Discharge status: Home / Self Care | Payer: Medicare HMO | Source: Ambulatory Visit | Attending: Otolaryngology | Admitting: Otolaryngology

## 2019-03-31 DIAGNOSIS — H9312 Tinnitus, left ear: Secondary | ICD-10-CM | POA: Diagnosis present

## 2019-03-31 DIAGNOSIS — H9193 Unspecified hearing loss, bilateral: Secondary | ICD-10-CM | POA: Diagnosis not present

## 2019-03-31 LAB — POCT I-STAT CREATININE: Creatinine, Ser: 0.9 mg/dL (ref 0.61–1.24)

## 2019-03-31 MED ORDER — GADOBUTROL 1 MMOL/ML IV SOLN
8.0000 mL | Freq: Once | INTRAVENOUS | Status: AC | PRN
  Administered 2019-03-31: 8 mL via INTRAVENOUS

## 2019-04-05 DIAGNOSIS — H903 Sensorineural hearing loss, bilateral: Secondary | ICD-10-CM | POA: Diagnosis not present

## 2019-04-05 DIAGNOSIS — H9312 Tinnitus, left ear: Secondary | ICD-10-CM | POA: Diagnosis not present

## 2019-05-03 DIAGNOSIS — R739 Hyperglycemia, unspecified: Secondary | ICD-10-CM | POA: Diagnosis not present

## 2019-05-03 DIAGNOSIS — M5136 Other intervertebral disc degeneration, lumbar region: Secondary | ICD-10-CM | POA: Diagnosis not present

## 2019-05-03 DIAGNOSIS — I8510 Secondary esophageal varices without bleeding: Secondary | ICD-10-CM | POA: Diagnosis not present

## 2019-05-03 DIAGNOSIS — E7849 Other hyperlipidemia: Secondary | ICD-10-CM | POA: Diagnosis not present

## 2019-05-03 DIAGNOSIS — N183 Chronic kidney disease, stage 3 unspecified: Secondary | ICD-10-CM | POA: Diagnosis not present

## 2019-05-03 DIAGNOSIS — I10 Essential (primary) hypertension: Secondary | ICD-10-CM | POA: Diagnosis not present

## 2019-05-06 DIAGNOSIS — D696 Thrombocytopenia, unspecified: Secondary | ICD-10-CM | POA: Diagnosis not present

## 2019-05-06 DIAGNOSIS — E7849 Other hyperlipidemia: Secondary | ICD-10-CM | POA: Diagnosis not present

## 2019-05-06 DIAGNOSIS — I8510 Secondary esophageal varices without bleeding: Secondary | ICD-10-CM | POA: Diagnosis not present

## 2019-05-06 DIAGNOSIS — M5136 Other intervertebral disc degeneration, lumbar region: Secondary | ICD-10-CM | POA: Diagnosis not present

## 2019-05-06 DIAGNOSIS — N183 Chronic kidney disease, stage 3 unspecified: Secondary | ICD-10-CM | POA: Diagnosis not present

## 2019-05-06 DIAGNOSIS — R739 Hyperglycemia, unspecified: Secondary | ICD-10-CM | POA: Diagnosis not present

## 2019-05-06 DIAGNOSIS — R69 Illness, unspecified: Secondary | ICD-10-CM | POA: Diagnosis not present

## 2019-05-06 DIAGNOSIS — M791 Myalgia, unspecified site: Secondary | ICD-10-CM | POA: Diagnosis not present

## 2019-05-06 DIAGNOSIS — D472 Monoclonal gammopathy: Secondary | ICD-10-CM | POA: Diagnosis not present

## 2019-05-06 DIAGNOSIS — I129 Hypertensive chronic kidney disease with stage 1 through stage 4 chronic kidney disease, or unspecified chronic kidney disease: Secondary | ICD-10-CM | POA: Diagnosis not present

## 2019-05-16 DIAGNOSIS — L300 Nummular dermatitis: Secondary | ICD-10-CM | POA: Diagnosis not present

## 2019-05-16 DIAGNOSIS — L570 Actinic keratosis: Secondary | ICD-10-CM | POA: Diagnosis not present

## 2019-06-29 ENCOUNTER — Telehealth: Payer: Self-pay

## 2019-06-29 NOTE — Telephone Encounter (Signed)
Left a message for patient to return my call. 

## 2019-06-29 NOTE — Telephone Encounter (Signed)
-----   Message from Ladene Artist, MD sent at 06/28/2019  1:22 PM EDT ----- Please contact this patient to schedule RUQ [place], CMP, CBC, PT/INR, AFP and REV. Thx

## 2019-07-05 NOTE — Telephone Encounter (Signed)
Left a message for patient to return my call. 

## 2019-07-07 NOTE — Telephone Encounter (Signed)
Left a message for patient to return my call. Will mail letter.

## 2019-07-15 DIAGNOSIS — Z87891 Personal history of nicotine dependence: Secondary | ICD-10-CM | POA: Diagnosis not present

## 2019-07-15 DIAGNOSIS — R21 Rash and other nonspecific skin eruption: Secondary | ICD-10-CM | POA: Diagnosis not present

## 2019-07-15 DIAGNOSIS — L739 Follicular disorder, unspecified: Secondary | ICD-10-CM | POA: Diagnosis not present

## 2019-08-02 DIAGNOSIS — D696 Thrombocytopenia, unspecified: Secondary | ICD-10-CM | POA: Diagnosis not present

## 2019-08-02 DIAGNOSIS — I10 Essential (primary) hypertension: Secondary | ICD-10-CM | POA: Diagnosis not present

## 2019-08-02 DIAGNOSIS — D472 Monoclonal gammopathy: Secondary | ICD-10-CM | POA: Diagnosis not present

## 2019-08-02 DIAGNOSIS — R739 Hyperglycemia, unspecified: Secondary | ICD-10-CM | POA: Diagnosis not present

## 2019-08-05 ENCOUNTER — Encounter: Payer: Self-pay | Admitting: Gastroenterology

## 2019-08-05 DIAGNOSIS — D472 Monoclonal gammopathy: Secondary | ICD-10-CM | POA: Diagnosis not present

## 2019-08-05 DIAGNOSIS — N183 Chronic kidney disease, stage 3 unspecified: Secondary | ICD-10-CM | POA: Diagnosis not present

## 2019-08-05 DIAGNOSIS — I8510 Secondary esophageal varices without bleeding: Secondary | ICD-10-CM | POA: Diagnosis not present

## 2019-08-05 DIAGNOSIS — Z87891 Personal history of nicotine dependence: Secondary | ICD-10-CM | POA: Diagnosis not present

## 2019-08-05 DIAGNOSIS — R69 Illness, unspecified: Secondary | ICD-10-CM | POA: Diagnosis not present

## 2019-08-05 DIAGNOSIS — R42 Dizziness and giddiness: Secondary | ICD-10-CM | POA: Diagnosis not present

## 2019-08-05 DIAGNOSIS — R3129 Other microscopic hematuria: Secondary | ICD-10-CM | POA: Diagnosis not present

## 2019-08-05 DIAGNOSIS — I10 Essential (primary) hypertension: Secondary | ICD-10-CM | POA: Diagnosis not present

## 2019-08-05 DIAGNOSIS — E785 Hyperlipidemia, unspecified: Secondary | ICD-10-CM | POA: Diagnosis not present

## 2019-08-05 DIAGNOSIS — I129 Hypertensive chronic kidney disease with stage 1 through stage 4 chronic kidney disease, or unspecified chronic kidney disease: Secondary | ICD-10-CM | POA: Diagnosis not present

## 2019-08-16 DIAGNOSIS — E7849 Other hyperlipidemia: Secondary | ICD-10-CM | POA: Diagnosis not present

## 2019-08-16 DIAGNOSIS — D472 Monoclonal gammopathy: Secondary | ICD-10-CM | POA: Diagnosis not present

## 2019-08-16 DIAGNOSIS — N183 Chronic kidney disease, stage 3 unspecified: Secondary | ICD-10-CM | POA: Diagnosis not present

## 2019-08-16 DIAGNOSIS — I8510 Secondary esophageal varices without bleeding: Secondary | ICD-10-CM | POA: Diagnosis not present

## 2019-08-16 DIAGNOSIS — R69 Illness, unspecified: Secondary | ICD-10-CM | POA: Diagnosis not present

## 2019-08-16 DIAGNOSIS — I129 Hypertensive chronic kidney disease with stage 1 through stage 4 chronic kidney disease, or unspecified chronic kidney disease: Secondary | ICD-10-CM | POA: Diagnosis not present

## 2019-08-16 DIAGNOSIS — M5136 Other intervertebral disc degeneration, lumbar region: Secondary | ICD-10-CM | POA: Diagnosis not present

## 2019-08-16 DIAGNOSIS — R739 Hyperglycemia, unspecified: Secondary | ICD-10-CM | POA: Diagnosis not present

## 2019-08-17 DIAGNOSIS — R69 Illness, unspecified: Secondary | ICD-10-CM | POA: Diagnosis not present

## 2019-08-17 DIAGNOSIS — F411 Generalized anxiety disorder: Secondary | ICD-10-CM | POA: Diagnosis not present

## 2019-08-19 DIAGNOSIS — R42 Dizziness and giddiness: Secondary | ICD-10-CM | POA: Diagnosis not present

## 2019-08-19 DIAGNOSIS — E7849 Other hyperlipidemia: Secondary | ICD-10-CM | POA: Diagnosis not present

## 2019-08-19 DIAGNOSIS — N183 Chronic kidney disease, stage 3 unspecified: Secondary | ICD-10-CM | POA: Diagnosis not present

## 2019-08-19 DIAGNOSIS — I10 Essential (primary) hypertension: Secondary | ICD-10-CM | POA: Diagnosis not present

## 2019-08-19 DIAGNOSIS — R002 Palpitations: Secondary | ICD-10-CM | POA: Diagnosis not present

## 2019-08-19 DIAGNOSIS — R69 Illness, unspecified: Secondary | ICD-10-CM | POA: Diagnosis not present

## 2019-08-22 DIAGNOSIS — H6062 Unspecified chronic otitis externa, left ear: Secondary | ICD-10-CM | POA: Diagnosis not present

## 2019-08-22 DIAGNOSIS — J019 Acute sinusitis, unspecified: Secondary | ICD-10-CM | POA: Diagnosis not present

## 2019-08-22 DIAGNOSIS — H6122 Impacted cerumen, left ear: Secondary | ICD-10-CM | POA: Diagnosis not present

## 2019-08-25 ENCOUNTER — Telehealth: Payer: Self-pay | Admitting: Gastroenterology

## 2019-08-26 NOTE — Telephone Encounter (Signed)
Called patient with no answer.

## 2019-08-26 NOTE — Telephone Encounter (Signed)
Patient states he had changed his address and his xifaxan patient assistance was lost in the mail. Patient states he was calling before to get the tracking number for xifaxan but he found that information. Informed patient we do not have that information anyway but to call us back if we can help in another way. Patient verbalized understanding.

## 2019-09-05 DIAGNOSIS — R002 Palpitations: Secondary | ICD-10-CM | POA: Diagnosis not present

## 2019-09-06 DIAGNOSIS — M5136 Other intervertebral disc degeneration, lumbar region: Secondary | ICD-10-CM | POA: Diagnosis not present

## 2019-09-06 DIAGNOSIS — M5416 Radiculopathy, lumbar region: Secondary | ICD-10-CM | POA: Diagnosis not present

## 2019-09-23 ENCOUNTER — Other Ambulatory Visit: Payer: Self-pay | Admitting: Internal Medicine

## 2019-09-23 DIAGNOSIS — I10 Essential (primary) hypertension: Secondary | ICD-10-CM | POA: Diagnosis not present

## 2019-09-23 DIAGNOSIS — Z125 Encounter for screening for malignant neoplasm of prostate: Secondary | ICD-10-CM | POA: Diagnosis not present

## 2019-09-23 DIAGNOSIS — R69 Illness, unspecified: Secondary | ICD-10-CM | POA: Diagnosis not present

## 2019-09-23 DIAGNOSIS — I129 Hypertensive chronic kidney disease with stage 1 through stage 4 chronic kidney disease, or unspecified chronic kidney disease: Secondary | ICD-10-CM | POA: Diagnosis not present

## 2019-09-23 DIAGNOSIS — R739 Hyperglycemia, unspecified: Secondary | ICD-10-CM | POA: Diagnosis not present

## 2019-09-23 DIAGNOSIS — D472 Monoclonal gammopathy: Secondary | ICD-10-CM | POA: Diagnosis not present

## 2019-09-23 DIAGNOSIS — E349 Endocrine disorder, unspecified: Secondary | ICD-10-CM | POA: Diagnosis not present

## 2019-09-23 DIAGNOSIS — N183 Chronic kidney disease, stage 3 unspecified: Secondary | ICD-10-CM | POA: Diagnosis not present

## 2019-09-23 DIAGNOSIS — R42 Dizziness and giddiness: Secondary | ICD-10-CM | POA: Diagnosis not present

## 2019-09-23 DIAGNOSIS — E785 Hyperlipidemia, unspecified: Secondary | ICD-10-CM | POA: Diagnosis not present

## 2019-09-23 DIAGNOSIS — M5136 Other intervertebral disc degeneration, lumbar region: Secondary | ICD-10-CM | POA: Diagnosis not present

## 2019-09-23 DIAGNOSIS — K7030 Alcoholic cirrhosis of liver without ascites: Secondary | ICD-10-CM

## 2019-09-23 DIAGNOSIS — I8510 Secondary esophageal varices without bleeding: Secondary | ICD-10-CM | POA: Diagnosis not present

## 2019-09-28 DIAGNOSIS — R001 Bradycardia, unspecified: Secondary | ICD-10-CM | POA: Diagnosis not present

## 2019-09-28 DIAGNOSIS — N183 Chronic kidney disease, stage 3 unspecified: Secondary | ICD-10-CM | POA: Diagnosis not present

## 2019-09-28 DIAGNOSIS — H8113 Benign paroxysmal vertigo, bilateral: Secondary | ICD-10-CM | POA: Diagnosis not present

## 2019-09-28 DIAGNOSIS — I10 Essential (primary) hypertension: Secondary | ICD-10-CM | POA: Diagnosis not present

## 2019-09-28 DIAGNOSIS — E7849 Other hyperlipidemia: Secondary | ICD-10-CM | POA: Diagnosis not present

## 2019-09-29 ENCOUNTER — Other Ambulatory Visit: Payer: Self-pay

## 2019-09-29 ENCOUNTER — Ambulatory Visit
Admission: RE | Admit: 2019-09-29 | Discharge: 2019-09-29 | Disposition: A | Discharge status: Home / Self Care | Payer: Medicare HMO | Source: Ambulatory Visit | Attending: Internal Medicine | Admitting: Internal Medicine

## 2019-09-29 DIAGNOSIS — R69 Illness, unspecified: Secondary | ICD-10-CM | POA: Diagnosis not present

## 2019-09-29 DIAGNOSIS — K8020 Calculus of gallbladder without cholecystitis without obstruction: Secondary | ICD-10-CM | POA: Diagnosis not present

## 2019-09-29 DIAGNOSIS — K7030 Alcoholic cirrhosis of liver without ascites: Secondary | ICD-10-CM | POA: Insufficient documentation

## 2019-10-25 ENCOUNTER — Other Ambulatory Visit (INDEPENDENT_AMBULATORY_CARE_PROVIDER_SITE_OTHER): Payer: Medicare HMO

## 2019-10-25 ENCOUNTER — Encounter: Payer: Self-pay | Admitting: Gastroenterology

## 2019-10-25 ENCOUNTER — Ambulatory Visit (INDEPENDENT_AMBULATORY_CARE_PROVIDER_SITE_OTHER): Payer: Medicare HMO | Admitting: Gastroenterology

## 2019-10-25 VITALS — BP 130/68 | HR 60 | Ht 65.0 in | Wt 198.0 lb

## 2019-10-25 DIAGNOSIS — K7031 Alcoholic cirrhosis of liver with ascites: Secondary | ICD-10-CM | POA: Diagnosis not present

## 2019-10-25 DIAGNOSIS — I8510 Secondary esophageal varices without bleeding: Secondary | ICD-10-CM

## 2019-10-25 DIAGNOSIS — I8500 Esophageal varices without bleeding: Secondary | ICD-10-CM

## 2019-10-25 DIAGNOSIS — R69 Illness, unspecified: Secondary | ICD-10-CM | POA: Diagnosis not present

## 2019-10-25 LAB — PROTIME-INR
INR: 1.4 ratio — ABNORMAL HIGH (ref 0.8–1.0)
Prothrombin Time: 15.3 s — ABNORMAL HIGH (ref 9.6–13.1)

## 2019-10-25 NOTE — Progress Notes (Signed)
° ° °  History of Present Illness: This is a 69 year old male with decompensated alcoholic cirrhosis, ascites, esophageal varices, portal gastropathy, HE. His last office visit with [place] was March 2020.  He has had interval blood work and right upper quadrant ultrasound through his PCP, Dr. Caryl Comes.  He states he had some lower leg edema that resolved after increasing Lasix to 40 mg daily for several days.  Current Medications, Allergies, Past Medical History, Past Surgical History, Family History and Social History were reviewed in [employer] record.   Physical Exam: General: Well developed, well nourished, no acute distress Head: Normocephalic and atraumatic Eyes:  sclerae anicteric, EOMI Ears: Normal auditory acuity Mouth: Not examined, mask on during Covid-19 pandemic Lungs: Clear throughout to auscultation Heart: Regular rate and rhythm; no murmurs, rubs or bruits Abdomen: Soft, non tender and non distended. No masses, hepatosplenomegaly or hernias noted. Normal Bowel sounds Rectal: Not done Musculoskeletal: Symmetrical with no gross deformities  Pulses:  Normal pulses noted Extremities: No clubbing, cyanosis, edema or deformities noted Neurological: Alert oriented x 4, grossly nonfocal Psychological:  Alert and cooperative. Normal mood and affect   Assessment and Recommendations:  1.  Decompensated alcoholic cirrhosis with ascites, portal hypertension, esophageal varices, portal gastropathy, HE.  Nadolol was discontinued due to bradycardia.  He underwent variceal banding last year and was due for surveillance EGD this month however with the recent delta variant surge, high hospital volumes and reduced elective surgeries we will defer his surveillance EGD to Sept 2022.  Continue furosemide 20 mg daily and spironolactone 50 mg daily.  Continue Xifaxan 550 mg p.o. twice daily.  Follow low-sodium diet.  Monitor daily weights and advise [place] about any significant, sustained  weight changes.  CBC and Chem profile from July 6 reviewed in care everywhere. PT/INR and AFP today.  RUQ ultrasound in 6 months with CBC, CMP, AFP, PT/INR in 6 months.  Ultrasound and blood work can be done through our office or through his PCP.  2. GERD.  Continue pantoprazole 40 mg daily.

## 2019-10-25 NOTE — Patient Instructions (Addendum)
If you are age 70 or older, your body mass index should be between 23-30. Your Body mass index is 32.95 kg/m. If this is out of the aforementioned range listed, please consider follow up with your Primary Care Provider.  If you are age 70 or younger, your body mass index should be between 19-25. Your Body mass index is 32.95 kg/m. If this is out of the aformentioned range listed, please consider follow up with your Primary Care Provider.   Your provider has requested that you go to the basement level for lab work before leaving today. Press "B" on the elevator. The lab is located at the first door on the left as you exit the elevator.  Thank you for choosing me and [facility].  Pricilla Riffle. Dagoberto Ligas., MD., [facility] Regal

## 2019-10-26 LAB — AFP TUMOR MARKER: AFP-Tumor Marker: 10.1 ng/mL — ABNORMAL HIGH (ref <6.1)

## 2019-10-28 ENCOUNTER — Other Ambulatory Visit: Payer: Self-pay

## 2019-10-28 DIAGNOSIS — K7031 Alcoholic cirrhosis of liver with ascites: Secondary | ICD-10-CM

## 2019-11-17 DIAGNOSIS — K7460 Unspecified cirrhosis of liver: Secondary | ICD-10-CM | POA: Diagnosis not present

## 2019-11-17 DIAGNOSIS — R200 Anesthesia of skin: Secondary | ICD-10-CM | POA: Diagnosis not present

## 2019-11-17 DIAGNOSIS — N183 Chronic kidney disease, stage 3 unspecified: Secondary | ICD-10-CM | POA: Diagnosis not present

## 2019-11-17 DIAGNOSIS — I8500 Esophageal varices without bleeding: Secondary | ICD-10-CM | POA: Diagnosis not present

## 2019-11-17 DIAGNOSIS — M47812 Spondylosis without myelopathy or radiculopathy, cervical region: Secondary | ICD-10-CM | POA: Diagnosis not present

## 2019-11-17 DIAGNOSIS — M4312 Spondylolisthesis, cervical region: Secondary | ICD-10-CM | POA: Diagnosis not present

## 2019-11-18 ENCOUNTER — Other Ambulatory Visit: Payer: Self-pay | Admitting: Internal Medicine

## 2019-11-18 DIAGNOSIS — N644 Mastodynia: Secondary | ICD-10-CM

## 2019-11-18 DIAGNOSIS — N6320 Unspecified lump in the left breast, unspecified quadrant: Secondary | ICD-10-CM

## 2019-11-21 ENCOUNTER — Other Ambulatory Visit: Payer: Self-pay | Admitting: Internal Medicine

## 2019-11-21 DIAGNOSIS — N6320 Unspecified lump in the left breast, unspecified quadrant: Secondary | ICD-10-CM

## 2019-11-21 DIAGNOSIS — N644 Mastodynia: Secondary | ICD-10-CM

## 2019-11-29 ENCOUNTER — Ambulatory Visit
Admission: RE | Admit: 2019-11-29 | Discharge: 2019-11-29 | Disposition: A | Discharge status: Home / Self Care | Payer: Medicare HMO | Source: Ambulatory Visit | Attending: Internal Medicine | Admitting: Internal Medicine

## 2019-11-29 ENCOUNTER — Other Ambulatory Visit: Payer: Self-pay

## 2019-11-29 DIAGNOSIS — N644 Mastodynia: Secondary | ICD-10-CM

## 2019-11-29 DIAGNOSIS — N62 Hypertrophy of breast: Secondary | ICD-10-CM | POA: Diagnosis not present

## 2019-11-29 DIAGNOSIS — N6320 Unspecified lump in the left breast, unspecified quadrant: Secondary | ICD-10-CM | POA: Insufficient documentation

## 2019-12-07 DIAGNOSIS — N189 Chronic kidney disease, unspecified: Secondary | ICD-10-CM | POA: Diagnosis not present

## 2019-12-07 DIAGNOSIS — K7460 Unspecified cirrhosis of liver: Secondary | ICD-10-CM | POA: Diagnosis not present

## 2019-12-07 DIAGNOSIS — Z23 Encounter for immunization: Secondary | ICD-10-CM | POA: Diagnosis not present

## 2019-12-07 DIAGNOSIS — I129 Hypertensive chronic kidney disease with stage 1 through stage 4 chronic kidney disease, or unspecified chronic kidney disease: Secondary | ICD-10-CM | POA: Diagnosis not present

## 2019-12-24 DIAGNOSIS — I10 Essential (primary) hypertension: Secondary | ICD-10-CM | POA: Diagnosis not present

## 2019-12-24 DIAGNOSIS — R9431 Abnormal electrocardiogram [ECG] [EKG]: Secondary | ICD-10-CM | POA: Diagnosis not present

## 2019-12-24 DIAGNOSIS — Z882 Allergy status to sulfonamides status: Secondary | ICD-10-CM | POA: Diagnosis not present

## 2019-12-24 DIAGNOSIS — Z79899 Other long term (current) drug therapy: Secondary | ICD-10-CM | POA: Diagnosis not present

## 2019-12-24 DIAGNOSIS — R42 Dizziness and giddiness: Secondary | ICD-10-CM | POA: Diagnosis not present

## 2019-12-24 DIAGNOSIS — R519 Headache, unspecified: Secondary | ICD-10-CM | POA: Diagnosis not present

## 2019-12-27 ENCOUNTER — Telehealth: Payer: Self-pay | Admitting: Gastroenterology

## 2019-12-27 NOTE — Telephone Encounter (Signed)
Patient states he did initiate the forms for [facility] patient assistance but the forms are different this year. Patient states he is having a hard time filling them out. Informed patient to bring his financial information to our office on Thursday and I will help him fill out the forms. Patient verbalized understanding.

## 2019-12-27 NOTE — Telephone Encounter (Signed)
Pt is requesting a call back from a nurse to assist him with completing the form to receive his Xifaxan.

## 2019-12-30 NOTE — Telephone Encounter (Signed)
Patient came by office and filled out patient assistance program paperwork. Patient still has to come back to office to bring a printed cost of medication from the pharmacy. Then will fax paperwork to [facility].

## 2020-01-03 ENCOUNTER — Other Ambulatory Visit: Payer: Self-pay | Admitting: Gastroenterology

## 2020-01-12 DIAGNOSIS — R69 Illness, unspecified: Secondary | ICD-10-CM | POA: Diagnosis not present

## 2020-01-12 DIAGNOSIS — I10 Essential (primary) hypertension: Secondary | ICD-10-CM | POA: Diagnosis not present

## 2020-01-12 DIAGNOSIS — R739 Hyperglycemia, unspecified: Secondary | ICD-10-CM | POA: Diagnosis not present

## 2020-01-12 DIAGNOSIS — N183 Chronic kidney disease, stage 3 unspecified: Secondary | ICD-10-CM | POA: Diagnosis not present

## 2020-01-12 DIAGNOSIS — M5136 Other intervertebral disc degeneration, lumbar region: Secondary | ICD-10-CM | POA: Diagnosis not present

## 2020-01-12 DIAGNOSIS — E7849 Other hyperlipidemia: Secondary | ICD-10-CM | POA: Diagnosis not present

## 2020-01-12 DIAGNOSIS — Z125 Encounter for screening for malignant neoplasm of prostate: Secondary | ICD-10-CM | POA: Diagnosis not present

## 2020-01-19 DIAGNOSIS — E7849 Other hyperlipidemia: Secondary | ICD-10-CM | POA: Diagnosis not present

## 2020-01-19 DIAGNOSIS — E349 Endocrine disorder, unspecified: Secondary | ICD-10-CM | POA: Diagnosis not present

## 2020-01-19 DIAGNOSIS — D472 Monoclonal gammopathy: Secondary | ICD-10-CM | POA: Diagnosis not present

## 2020-01-19 DIAGNOSIS — N1831 Chronic kidney disease, stage 3a: Secondary | ICD-10-CM | POA: Diagnosis not present

## 2020-01-19 DIAGNOSIS — Z0000 Encounter for general adult medical examination without abnormal findings: Secondary | ICD-10-CM | POA: Diagnosis not present

## 2020-01-19 DIAGNOSIS — D696 Thrombocytopenia, unspecified: Secondary | ICD-10-CM | POA: Diagnosis not present

## 2020-01-19 DIAGNOSIS — R739 Hyperglycemia, unspecified: Secondary | ICD-10-CM | POA: Diagnosis not present

## 2020-01-19 DIAGNOSIS — R69 Illness, unspecified: Secondary | ICD-10-CM | POA: Diagnosis not present

## 2020-01-19 DIAGNOSIS — I8510 Secondary esophageal varices without bleeding: Secondary | ICD-10-CM | POA: Diagnosis not present

## 2020-01-19 NOTE — Telephone Encounter (Signed)
Patient is requesting to speak with you regarding the paperwork

## 2020-01-19 NOTE — Telephone Encounter (Signed)
Patient states he has the rest of the paperwork needed for the patient assistance to be faxed to [facility]. Patient states he will come by next week to drop it off. Informed patient I am off next week but to ask for PJ, CMA and she can give patient his copies of patient assistance forms and also fax the originals to [facility]. Patient verbalized understanding.

## 2020-01-23 NOTE — Telephone Encounter (Signed)
[name] came by this morning and dropped off the paperwork needed. The forms have been faxed to # [phone] and we got confirmation it went thru. I failed to give him his copies when he was here so I am mailing them to him. He was dizzy and I helped him back to the car. He said he's on new medicine. He will contact the Doctor when he gets home about this side effect. He had a driver today.

## 2020-02-01 DIAGNOSIS — R69 Illness, unspecified: Secondary | ICD-10-CM | POA: Diagnosis not present

## 2020-02-01 DIAGNOSIS — R001 Bradycardia, unspecified: Secondary | ICD-10-CM | POA: Diagnosis not present

## 2020-02-01 DIAGNOSIS — I959 Hypotension, unspecified: Secondary | ICD-10-CM | POA: Diagnosis not present

## 2020-02-01 DIAGNOSIS — R600 Localized edema: Secondary | ICD-10-CM | POA: Diagnosis not present

## 2020-02-10 DIAGNOSIS — Y92009 Unspecified place in unspecified non-institutional (private) residence as the place of occurrence of the external cause: Secondary | ICD-10-CM | POA: Diagnosis not present

## 2020-02-10 DIAGNOSIS — I8510 Secondary esophageal varices without bleeding: Secondary | ICD-10-CM | POA: Diagnosis not present

## 2020-02-10 DIAGNOSIS — M545 Low back pain, unspecified: Secondary | ICD-10-CM | POA: Diagnosis not present

## 2020-02-10 DIAGNOSIS — K7460 Unspecified cirrhosis of liver: Secondary | ICD-10-CM | POA: Diagnosis not present

## 2020-02-10 DIAGNOSIS — W010XXA Fall on same level from slipping, tripping and stumbling without subsequent striking against object, initial encounter: Secondary | ICD-10-CM | POA: Diagnosis not present

## 2020-02-10 DIAGNOSIS — E349 Endocrine disorder, unspecified: Secondary | ICD-10-CM | POA: Diagnosis not present

## 2020-02-10 DIAGNOSIS — I10 Essential (primary) hypertension: Secondary | ICD-10-CM | POA: Diagnosis not present

## 2020-02-10 DIAGNOSIS — R001 Bradycardia, unspecified: Secondary | ICD-10-CM | POA: Diagnosis not present

## 2020-02-10 DIAGNOSIS — M546 Pain in thoracic spine: Secondary | ICD-10-CM | POA: Diagnosis not present

## 2020-02-15 DIAGNOSIS — M545 Low back pain, unspecified: Secondary | ICD-10-CM | POA: Diagnosis not present

## 2020-02-15 DIAGNOSIS — M5489 Other dorsalgia: Secondary | ICD-10-CM | POA: Diagnosis not present

## 2020-02-15 DIAGNOSIS — I10 Essential (primary) hypertension: Secondary | ICD-10-CM | POA: Diagnosis not present

## 2020-02-20 DIAGNOSIS — D472 Monoclonal gammopathy: Secondary | ICD-10-CM | POA: Diagnosis not present

## 2020-02-20 DIAGNOSIS — N179 Acute kidney failure, unspecified: Secondary | ICD-10-CM | POA: Diagnosis not present

## 2020-02-20 DIAGNOSIS — I8510 Secondary esophageal varices without bleeding: Secondary | ICD-10-CM | POA: Diagnosis not present

## 2020-02-20 DIAGNOSIS — R69 Illness, unspecified: Secondary | ICD-10-CM | POA: Diagnosis not present

## 2020-02-20 DIAGNOSIS — E349 Endocrine disorder, unspecified: Secondary | ICD-10-CM | POA: Diagnosis not present

## 2020-02-20 DIAGNOSIS — N183 Chronic kidney disease, stage 3 unspecified: Secondary | ICD-10-CM | POA: Diagnosis not present

## 2020-02-20 DIAGNOSIS — D696 Thrombocytopenia, unspecified: Secondary | ICD-10-CM | POA: Diagnosis not present

## 2020-02-20 DIAGNOSIS — R739 Hyperglycemia, unspecified: Secondary | ICD-10-CM | POA: Diagnosis not present

## 2020-02-20 DIAGNOSIS — E7849 Other hyperlipidemia: Secondary | ICD-10-CM | POA: Diagnosis not present

## 2020-02-20 DIAGNOSIS — I129 Hypertensive chronic kidney disease with stage 1 through stage 4 chronic kidney disease, or unspecified chronic kidney disease: Secondary | ICD-10-CM | POA: Diagnosis not present

## 2020-02-29 ENCOUNTER — Other Ambulatory Visit: Payer: Self-pay

## 2020-02-29 ENCOUNTER — Observation Stay
Admission: EM | Admit: 2020-02-29 | Discharge: 2020-03-01 | Disposition: A | Discharge status: Home / Self Care | Payer: Medicare HMO | Attending: Obstetrics and Gynecology | Admitting: Obstetrics and Gynecology

## 2020-02-29 DIAGNOSIS — E876 Hypokalemia: Secondary | ICD-10-CM | POA: Insufficient documentation

## 2020-02-29 DIAGNOSIS — I4581 Long QT syndrome: Secondary | ICD-10-CM | POA: Diagnosis not present

## 2020-02-29 DIAGNOSIS — G4733 Obstructive sleep apnea (adult) (pediatric): Secondary | ICD-10-CM | POA: Diagnosis not present

## 2020-02-29 DIAGNOSIS — U071 COVID-19: Secondary | ICD-10-CM | POA: Diagnosis not present

## 2020-02-29 DIAGNOSIS — K7460 Unspecified cirrhosis of liver: Secondary | ICD-10-CM | POA: Diagnosis present

## 2020-02-29 DIAGNOSIS — D5911 Warm autoimmune hemolytic anemia: Secondary | ICD-10-CM | POA: Diagnosis not present

## 2020-02-29 DIAGNOSIS — D649 Anemia, unspecified: Secondary | ICD-10-CM | POA: Insufficient documentation

## 2020-02-29 DIAGNOSIS — K7290 Hepatic failure, unspecified without coma: Secondary | ICD-10-CM | POA: Diagnosis present

## 2020-02-29 DIAGNOSIS — R69 Illness, unspecified: Secondary | ICD-10-CM | POA: Diagnosis not present

## 2020-02-29 DIAGNOSIS — I952 Hypotension due to drugs: Secondary | ICD-10-CM | POA: Diagnosis not present

## 2020-02-29 DIAGNOSIS — Z79899 Other long term (current) drug therapy: Secondary | ICD-10-CM | POA: Insufficient documentation

## 2020-02-29 DIAGNOSIS — K219 Gastro-esophageal reflux disease without esophagitis: Secondary | ICD-10-CM | POA: Insufficient documentation

## 2020-02-29 DIAGNOSIS — I959 Hypotension, unspecified: Principal | ICD-10-CM | POA: Insufficient documentation

## 2020-02-29 DIAGNOSIS — R9431 Abnormal electrocardiogram [ECG] [EKG]: Secondary | ICD-10-CM | POA: Diagnosis present

## 2020-02-29 DIAGNOSIS — K922 Gastrointestinal hemorrhage, unspecified: Secondary | ICD-10-CM

## 2020-02-29 DIAGNOSIS — I10 Essential (primary) hypertension: Secondary | ICD-10-CM | POA: Insufficient documentation

## 2020-02-29 DIAGNOSIS — E785 Hyperlipidemia, unspecified: Secondary | ICD-10-CM | POA: Insufficient documentation

## 2020-02-29 DIAGNOSIS — J45909 Unspecified asthma, uncomplicated: Secondary | ICD-10-CM | POA: Insufficient documentation

## 2020-02-29 DIAGNOSIS — I8500 Esophageal varices without bleeding: Secondary | ICD-10-CM | POA: Diagnosis present

## 2020-02-29 DIAGNOSIS — K7030 Alcoholic cirrhosis of liver without ascites: Secondary | ICD-10-CM | POA: Insufficient documentation

## 2020-02-29 DIAGNOSIS — Z87891 Personal history of nicotine dependence: Secondary | ICD-10-CM | POA: Insufficient documentation

## 2020-02-29 DIAGNOSIS — R195 Other fecal abnormalities: Secondary | ICD-10-CM | POA: Diagnosis present

## 2020-02-29 DIAGNOSIS — Z743 Need for continuous supervision: Secondary | ICD-10-CM | POA: Diagnosis not present

## 2020-02-29 DIAGNOSIS — D696 Thrombocytopenia, unspecified: Secondary | ICD-10-CM | POA: Insufficient documentation

## 2020-02-29 DIAGNOSIS — R0689 Other abnormalities of breathing: Secondary | ICD-10-CM | POA: Diagnosis not present

## 2020-02-29 LAB — MAGNESIUM: Magnesium: 1.7 mg/dL (ref 1.7–2.4)

## 2020-02-29 LAB — PHOSPHORUS: Phosphorus: 3.7 mg/dL (ref 2.5–4.6)

## 2020-02-29 LAB — CBC WITH DIFFERENTIAL/PLATELET
Abs Immature Granulocytes: 0.02 10*3/uL (ref 0.00–0.07)
Abs Immature Granulocytes: 0 10*3/uL (ref 0.00–0.07)
Basophils Absolute: 0 10*3/uL (ref 0.0–0.1)
Basophils Absolute: 0 10*3/uL (ref 0.0–0.1)
Basophils Relative: 0 %
Basophils Relative: 0 %
Eosinophils Absolute: 0.3 10*3/uL (ref 0.0–0.5)
Eosinophils Absolute: 0.2 10*3/uL (ref 0.0–0.5)
Eosinophils Relative: 8 %
Eosinophils Relative: 6 %
HCT: 22.7 % — ABNORMAL LOW (ref 39.0–52.0)
HCT: 26.1 % — ABNORMAL LOW (ref 39.0–52.0)
Hemoglobin: 7.5 g/dL — ABNORMAL LOW (ref 13.0–17.0)
Hemoglobin: 8.7 g/dL — ABNORMAL LOW (ref 13.0–17.0)
Immature Granulocytes: 1 %
Immature Granulocytes: 0 %
Lymphocytes Relative: 24 %
Lymphocytes Relative: 21 %
Lymphs Abs: 0.9 10*3/uL (ref 0.7–4.0)
Lymphs Abs: 0.5 10*3/uL — ABNORMAL LOW (ref 0.7–4.0)
MCH: 33.5 pg (ref 26.0–34.0)
MCH: 33.6 pg (ref 26.0–34.0)
MCHC: 33 g/dL (ref 30.0–36.0)
MCHC: 33.3 g/dL (ref 30.0–36.0)
MCV: 101.3 fL — ABNORMAL HIGH (ref 80.0–100.0)
MCV: 100.8 fL — ABNORMAL HIGH (ref 80.0–100.0)
Monocytes Absolute: 0.6 10*3/uL (ref 0.1–1.0)
Monocytes Absolute: 0.3 10*3/uL (ref 0.1–1.0)
Monocytes Relative: 15 %
Monocytes Relative: 11 %
Neutro Abs: 1.9 10*3/uL (ref 1.7–7.7)
Neutro Abs: 1.6 10*3/uL — ABNORMAL LOW (ref 1.7–7.7)
Neutrophils Relative %: 52 %
Neutrophils Relative %: 62 %
Platelets: 67 10*3/uL — ABNORMAL LOW (ref 150–400)
Platelets: 63 10*3/uL — ABNORMAL LOW (ref 150–400)
RBC: 2.24 MIL/uL — ABNORMAL LOW (ref 4.22–5.81)
RBC: 2.59 MIL/uL — ABNORMAL LOW (ref 4.22–5.81)
RDW: 14.7 % (ref 11.5–15.5)
RDW: 14.5 % (ref 11.5–15.5)
Smear Review: NORMAL
WBC: 3.7 10*3/uL — ABNORMAL LOW (ref 4.0–10.5)
WBC: 2.6 10*3/uL — ABNORMAL LOW (ref 4.0–10.5)
nRBC: 0 % (ref 0.0–0.2)
nRBC: 0 % (ref 0.0–0.2)

## 2020-02-29 LAB — IRON AND TIBC
Iron: 48 ug/dL (ref 45–182)
Saturation Ratios: 23 % (ref 17.9–39.5)
TIBC: 209 ug/dL — ABNORMAL LOW (ref 250–450)
UIBC: 161 ug/dL

## 2020-02-29 LAB — COMPREHENSIVE METABOLIC PANEL
ALT: 31 U/L (ref 0–44)
AST: 54 U/L — ABNORMAL HIGH (ref 15–41)
Albumin: 2.3 g/dL — ABNORMAL LOW (ref 3.5–5.0)
Alkaline Phosphatase: 87 U/L (ref 38–126)
Anion gap: 7 (ref 5–15)
BUN: 10 mg/dL (ref 8–23)
CO2: 19 mmol/L — ABNORMAL LOW (ref 22–32)
Calcium: 7.5 mg/dL — ABNORMAL LOW (ref 8.9–10.3)
Chloride: 111 mmol/L (ref 98–111)
Creatinine, Ser: 1.17 mg/dL (ref 0.61–1.24)
GFR, Estimated: >60 mL/min (ref >60)
Glucose, Bld: 112 mg/dL — ABNORMAL HIGH (ref 70–99)
Potassium: 3.1 mmol/L — ABNORMAL LOW (ref 3.5–5.1)
Sodium: 137 mmol/L (ref 135–145)
Total Bilirubin: 1.8 mg/dL — ABNORMAL HIGH (ref 0.3–1.2)
Total Protein: 4.7 g/dL — ABNORMAL LOW (ref 6.5–8.1)

## 2020-02-29 LAB — RETICULOCYTES
Immature Retic Fract: 25.8 % — ABNORMAL HIGH (ref 2.3–15.9)
RBC.: 2.58 MIL/uL — ABNORMAL LOW (ref 4.22–5.81)
Retic Count, Absolute: 77.9 10*3/uL (ref 19.0–186.0)
Retic Ct Pct: 3 % (ref 0.4–3.1)

## 2020-02-29 LAB — PROTIME-INR
INR: 1.6 — ABNORMAL HIGH (ref 0.8–1.2)
Prothrombin Time: 18.8 seconds — ABNORMAL HIGH (ref 11.4–15.2)

## 2020-02-29 LAB — SARS CORONAVIRUS 2 BY RT PCR (HOSPITAL ORDER, PERFORMED IN ~~LOC~~ HOSPITAL LAB): SARS Coronavirus 2: POSITIVE — AB

## 2020-02-29 LAB — FOLATE: Folate: 11.5 ng/mL (ref >5.9)

## 2020-02-29 LAB — ETHANOL: Alcohol, Ethyl (B): <10 mg/dL (ref <10)

## 2020-02-29 LAB — FERRITIN: Ferritin: 132 ng/mL (ref 24–336)

## 2020-02-29 LAB — TROPONIN I (HIGH SENSITIVITY)
Troponin I (High Sensitivity): 44 ng/L — ABNORMAL HIGH (ref <18)
Troponin I (High Sensitivity): 42 ng/L — ABNORMAL HIGH (ref <18)

## 2020-02-29 LAB — AMMONIA: Ammonia: 45 umol/L — ABNORMAL HIGH (ref 9–35)

## 2020-02-29 MED ORDER — SODIUM CHLORIDE 0.9 % IV SOLN: 10.0000 mL/h | Freq: Once | INTRAVENOUS | Status: DC

## 2020-02-29 MED ORDER — SODIUM CHLORIDE 0.9 % IV SOLN
80.0000 mg | Freq: Once | INTRAVENOUS | Status: AC
  Administered 2020-02-29: 80 mg via INTRAVENOUS
  Filled 2020-02-29: qty 80

## 2020-02-29 MED ORDER — SODIUM CHLORIDE 0.9 % IV BOLUS
500.0000 mL | Freq: Once | INTRAVENOUS | Status: AC
  Administered 2020-02-29: 500 mL via INTRAVENOUS

## 2020-02-29 MED ORDER — SODIUM CHLORIDE 0.9 % IV SOLN
8.0000 mg/h | INTRAVENOUS | Status: DC
  Administered 2020-03-01 (×2): 8 mg/h via INTRAVENOUS
  Filled 2020-02-29 (×2): qty 80

## 2020-02-29 MED ORDER — SODIUM CHLORIDE 0.9% IV SOLUTION: Freq: Once | INTRAVENOUS | Status: DC

## 2020-02-29 MED ORDER — POTASSIUM CHLORIDE 10 MEQ/100ML IV SOLN
10.0000 meq | INTRAVENOUS | Status: AC
  Administered 2020-02-29 – 2020-03-01 (×2): 10 meq via INTRAVENOUS
  Filled 2020-02-29: qty 100

## 2020-02-29 MED ORDER — MAGNESIUM SULFATE IN D5W 1-5 GM/100ML-% IV SOLN
1.0000 g | Freq: Once | INTRAVENOUS | Status: AC
  Administered 2020-03-01: 04:00:00 1 g via INTRAVENOUS
  Filled 2020-02-29 (×2): qty 100

## 2020-02-29 NOTE — ED Notes (Signed)
Patient provided with warm blanket and tv remote per request. Placed in bed with bed low and locked, side rails raised x2. Cardiac monitor in place and call bell in reach. Patient breathing easy and unlabored speaking in full sentences with symmetric chest rise and fall. Denies pain at this time and is free from apparent distress. Primary RN to continue to monitor.

## 2020-02-29 NOTE — H&P (Incomplete)
Jeff Ward VZC:[id number] DOB: [birth year] DOA: 02/29/2020     PCP: Lynnea Ferrier, MD   Outpatient Specialists:     GI  Dr. Russella Dar    [facility]    Patient arrived to ER on 02/29/20 at 1945 Referred by Attending Minna Antis, MD   Patient coming from: home Lives  With family    Chief Complaint:   Chief Complaint  Patient presents with  . Hypotension    HPI: Jeff Ward is a 70 y.o. male with medical history significant of HTN, Anemia, GERD, HLD, asthma, CKD, depression, HTN, history of stroke, history of alcohol abuse History of alcohol liver disease w varices, MGUS, thrombocytopenia  Presented with   abnormal blood pressure reading.  Today patient took his blood pressure at home as he usually does and show to be 230s systolics he became concerned and took a dose of losartan he try to check it again but it was reading his error and became dizzy.  And EMS was called and on their arrival systolics was in 65s patient was given 1 L in the upon arrival to [facility] emergency department blood pressure was 90/47. Patient denies any chest pain shortness of breath  Patient has history of rapidly fluctuating blood pressures In the past have had a trial of amlodipine but was taken off secondary to edema they tried losartan and diuretics but patient became significantly dehydrated and hypotensive with creatinine going up to 3.5 at which point there was stopped. His primary care provider recommended using losartan only when his blood pressure goes up above 200  Regarding patient cirrhosis and esophageal disease he could not tolerate beta-blockers in the past secondary to bradycardia and hypotension supposed to follow-up with Dr. Russella Dar  He is followed for his MGUS with hematology periodically.  Patient is known chronic thrombocytopenia which felt to be secondary to cirrhosis  Denies any black stool no blood in his stool    Infectious risk factors:  Reports  fatigue      Has  been  vaccinated against COVID in July not boosted   Initial COVID TEST   positive  Lab Results  Component Value Date   SARSCOV2NAA POSITIVE (A) 02/29/2020   SARSCOV2NAA NEGATIVE 10/07/2018    Regarding pertinent Chronic problems:     Hyperlipidemia -  on  [facility] Lipid Panel     Component Value Date/Time   CHOL 119 01/24/2016 1230   CHOL 193 12/20/2013 0907   TRIG 111.0 01/24/2016 1230   HDL 24.50 (L) 01/24/2016 1230   HDL 35 (L) 12/20/2013 0907   CHOLHDL 5 01/24/2016 1230   VLDL 22.2 01/24/2016 1230   LDLCALC 72 01/24/2016 1230   LDLCALC 133 (H) 12/20/2013 0907   LABVLDL 25 12/20/2013 0907     HTN on Losartan says PRN SBP >200       Asthma -well  controlled on home inhalers      OSA - not on CPAP     Hx of CVA -   with/out residual deficits   History of GI bleed and varices.  On Protonix    CKD stage II - baseline Cr 1.1 Estimated Creatinine Clearance: 57.8 mL/min (by C-G formula based on SCr of 1.17 mg/dL).  Lab Results  Component Value Date   CREATININE 1.17 02/29/2020   CREATININE 0.90 03/31/2019   CREATININE 1.14 07/19/2018      Liver disease Computed MELD-Na score unavailable. Necessary lab results were not found in the last  year. Computed MELD score unavailable. Necessary lab results were not found in the last year.    Chronic anemia - baseline hg Hemoglobin & Hematocrit  Recent Labs    02/29/20 1949  HGB 7.5*     While in ER: Was given additional IV fluids Hemoglobin noted to be down to 7.5 Last hemoglobin was 9.5 in January 22 Has been trending down for past several months. Hemoccult positive but stool was brown. ER provider ordered 2 units packed red blood cells Protonix bolus and infusion  Noted mildly elevated troponin EKG nonischemic    Hospitalist was called for admission for symptomatic anemia  The following Work up has been ordered so far:  Orders Placed This Encounter  Procedures  . CBC with Differential  . Comprehensive  metabolic panel  . Informed Consent Details: Physician/Practitioner Attestation; Transcribe to consent form and obtain patient signature  . Consult to hospitalist  ALL PATIENTS BEING ADMITTED/HAVING PROCEDURES NEED COVID-19 SCREENING  . EKG 12-Lead  . EKG 12-Lead  . Type and screen [place]  . Prepare RBC (crossmatch)     Following Medications were ordered in ER: Medications  0.9 %  sodium chloride infusion (has no administration in time range)  pantoprazole (PROTONIX) 80 mg in sodium chloride 0.9 % 100 mL IVPB (has no administration in time range)  pantoprazole (PROTONIX) 80 mg in sodium chloride 0.9 % 100 mL (0.8 mg/mL) infusion (has no administration in time range)  sodium chloride 0.9 % bolus 500 mL (0 mLs Intravenous Stopped 02/29/20 2027)  sodium chloride 0.9 % bolus 500 mL (0 mLs Intravenous Stopped 02/29/20 2148)        Consult Orders  (From admission, onward)         Start     Ordered   02/29/20 2144  Consult to hospitalist  ALL PATIENTS BEING ADMITTED/HAVING PROCEDURES NEED COVID-19 SCREENING  Once       Comments: ALL PATIENTS BEING ADMITTED/HAVING PROCEDURES NEED COVID-19 SCREENING  Provider:  (Not yet assigned)  Question Answer Comment  Place call to: [facility]   Reason for Consult Admit   Diagnosis/Clinical Info for Consult: hypotension, gib      02/29/20 2143          Significant initial  Findings: Abnormal Labs Reviewed  CBC WITH DIFFERENTIAL/PLATELET - Abnormal; Notable for the following components:      Result Value   WBC 3.7 (*)    RBC 2.24 (*)    Hemoglobin 7.5 (*)    HCT 22.7 (*)    MCV 101.3 (*)    Platelets 67 (*)    All other components within normal limits  COMPREHENSIVE METABOLIC PANEL - Abnormal; Notable for the following components:   Potassium 3.1 (*)    CO2 19 (*)    Glucose, Bld 112 (*)    Calcium 7.5 (*)    Total Protein 4.7 (*)    Albumin 2.3 (*)    AST 54 (*)    Total Bilirubin 1.8 (*)    All other components  within normal limits  TROPONIN I (HIGH SENSITIVITY) - Abnormal; Notable for the following components:   Troponin I (High Sensitivity) 44 (*)    All other components within normal limits     Otherwise labs showing:  Recent Labs  Lab 02/29/20 1949  NA 137  K 3.1*  CO2 19*  GLUCOSE 112*  BUN 10  CREATININE 1.17  CALCIUM 7.5*    Cr   stable,    Lab  Results  Component Value Date   CREATININE 1.17 02/29/2020   CREATININE 0.90 03/31/2019   CREATININE 1.14 07/19/2018    Recent Labs  Lab 02/29/20 1949  AST 54*  ALT 31  ALKPHOS 87  BILITOT 1.8*  PROT 4.7*  ALBUMIN 2.3*   Lab Results  Component Value Date   CALCIUM 7.5 (L) 02/29/2020   PHOS 3.6 07/29/2015   WBC      Component Value Date/Time   WBC 3.7 (L) 02/29/2020 1949   LYMPHSABS 0.9 02/29/2020 1949   LYMPHSABS 1.7 12/20/2013 0907   MONOABS 0.6 02/29/2020 1949   EOSABS 0.3 02/29/2020 1949   EOSABS 0.4 12/20/2013 0907   BASOSABS 0.0 02/29/2020 1949   BASOSABS 0.0 12/20/2013 0907    Plt: Lab Results  Component Value Date   PLT 67 (L) 02/29/2020        COVID-19 Labs  No results for input(s): DDIMER, FERRITIN, LDH, CRP in the last 72 hours.  Lab Results  Component Value Date   [place] NEGATIVE 10/07/2018       HG/HCT   Down   from baseline see below    Component Value Date/Time   HGB 7.5 (L) 02/29/2020 1949   HCT 22.7 (L) 02/29/2020 1949   MCV 101.3 (H) 02/29/2020 1949    No results for input(s): LIPASE, AMYLASE in the last 168 hours. Recent Labs  Lab 02/29/20 2213  AMMONIA 45*       Troponin 44 -42 Cardiac Panel (last 3 results) No results for input(s): CKTOTAL, CKMB, TROPONINI, RELINDX in the last 72 hours.     ECG: Ordered Personally reviewed by me showing: HR : 65 Rhythm:  NSR,    no evidence of ischemic changes QTC 514          UA ordered   Urine analysis:    Component Value Date/Time   COLORURINE STRAW (A) 11/23/2015 1605   APPEARANCEUR CLEAR (A) 11/23/2015 1605    LABSPEC 1.008 11/23/2015 1605   PHURINE 5.0 11/23/2015 1605   GLUCOSEU NEGATIVE 11/23/2015 1605   HGBUR 1+ (A) 11/23/2015 1605   BILIRUBINUR NEGATIVE 11/23/2015 1605   KETONESUR NEGATIVE 11/23/2015 1605   PROTEINUR NEGATIVE 11/23/2015 1605   NITRITE NEGATIVE 11/23/2015 1605   LEUKOCYTESUR NEGATIVE 11/23/2015 1605       ED Triage Vitals  Enc Vitals Group     BP 02/29/20 1950 (!) 90/47     Pulse Rate 02/29/20 1950 70     Resp 02/29/20 1950 18     Temp 02/29/20 1954 98.1 F (36.7 C)     Temp Source 02/29/20 1950 Oral     SpO2 02/29/20 1950 100 %     Weight 02/29/20 1951 180 lb (81.6 kg)     Height 02/29/20 1951 5\' 5"  (1.651 m)     Head Circumference --      Peak Flow --      Pain Score 02/29/20 1950 0     Pain Loc --      Pain Edu? --      Excl. in [place]? --   TMAX(24)@       Latest  Blood pressure (!) 108/52, pulse 66, temperature 98.1 F (36.7 C), temperature source Oral, resp. rate 17, height 5\' 5"  (1.651 m), weight 81.6 kg, SpO2 100 %.       Review of Systems:    Pertinent positives include:  , fatigue, dizziness,  Constitutional:  No weight loss, night sweats, Fevers, chills, weight loss  HEENT:  No headaches, Difficulty swallowing,Tooth/dental problems,Sore throat,  No sneezing, itching, ear ache, nasal congestion, post nasal drip,  Cardio-vascular:  No chest pain, Orthopnea, PND, anasarca palpitations.no Bilateral lower extremity swelling  GI:  No heartburn, indigestion, abdominal pain, nausea, vomiting, diarrhea, change in bowel habits, loss of appetite, melena, blood in stool, hematemesis Resp:  no shortness of breath at rest. No dyspnea on exertion, No excess mucus, no productive cough, No non-productive cough, No coughing up of blood.No change in color of mucus.No wheezing. Skin:  no rash or lesions. No jaundice GU:  no dysuria, change in color of urine, no urgency or frequency. No straining to urinate.  No flank pain.  Musculoskeletal:  No joint pain or  no joint swelling. No decreased range of motion. No back pain.  Psych:  No change in mood or affect. No depression or anxiety. No memory loss.  Neuro: no localizing neurological complaints, no tingling, no weakness, no double vision, no gait abnormality, no slurred speech, no confusion  All systems reviewed and apart from [place] all are negative  Past Medical History:   Past Medical History:  Diagnosis Date  . Allergy    SEASONAL  . Anemia   . Anxiety   . Arthritis    In back and all joints  . Asthma   . Chronic headache    now resolved  . CKD (chronic kidney disease)   . 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 Depression   . Esophageal varices ([place])   . GERD (gastroesophageal reflux disease)   . GI bleed   . Hiatal hernia   . History of kidney stones 1970's  . Hyperlipidemia 06/2002  . Hypertension   . Liver disease   . Psoriasis   . Stroke ([place])    PT. STATED MILD  . Substance abuse ([place])    H/O ETOH      Past Surgical History:  Procedure Laterality Date  . CARPAL TUNNEL RELEASE  2005    / right hand  . COLONOSCOPY     march 2016  . ESOPHAGEAL BANDING  09/22/2017   Procedure: ESOPHAGEAL BANDING;  Surgeon: Ladene Artist, MD;  Location: [facility];  Service: Endoscopy;;  . ESOPHAGEAL BANDING  10/11/2018   Procedure: ESOPHAGEAL BANDING;  Surgeon: Ladene Artist, MD;  Location: [facility] ENDOSCOPY;  Service: Endoscopy;;  . ESOPHAGOGASTRODUODENOSCOPY N/A 07/24/2015   Procedure: ESOPHAGOGASTRODUODENOSCOPY (EGD);  Surgeon: Lucilla Lame, MD;  Location: [facility] ENDOSCOPY;  Service: Endoscopy;  Laterality: N/A;  Banding  . ESOPHAGOGASTRODUODENOSCOPY (EGD) WITH PROPOFOL N/A 08/14/2015   Procedure: ESOPHAGOGASTRODUODENOSCOPY (EGD) WITH PROPOFOL;  Surgeon: Ladene Artist, MD;  Location: [facility] ENDOSCOPY;  Service: Endoscopy;  Laterality: N/A;  . ESOPHAGOGASTRODUODENOSCOPY (EGD) WITH PROPOFOL N/A 09/22/2017   Procedure: ESOPHAGOGASTRODUODENOSCOPY (EGD) WITH PROPOFOL;   Surgeon: Ladene Artist, MD;  Location: [facility] ENDOSCOPY;  Service: Endoscopy;  Laterality: N/A;  . ESOPHAGOGASTRODUODENOSCOPY (EGD) WITH PROPOFOL N/A 10/11/2018   Procedure: ESOPHAGOGASTRODUODENOSCOPY (EGD) WITH PROPOFOL;  Surgeon: Ladene Artist, MD;  Location: [facility] ENDOSCOPY;  Service: Endoscopy;  Laterality: N/A;  . TONSILLECTOMY AND ADENOIDECTOMY     70 years old  . VASECTOMY      Social History:  Ambulatory   Independently      reports that he quit smoking about 9 years ago. His smoking use included cigars. He quit after 35.00 years of use. He has never used smokeless tobacco. He reports that he does not drink alcohol and does not use drugs.   Family History:  Family History  Problem Relation Age of Onset  . CAD Mother   . CVA Mother   . Heart disease Mother   . Melanoma Father   . CVA Father   . Liver disease Brother   . Breast cancer Paternal Aunt   . Alcohol abuse Maternal Grandfather     Allergies: Allergies  Allergen Reactions  . Bee Venom Anaphylaxis  . Sulfa Antibiotics Other (See Comments)    Reaction:  Fainting      Prior to Admission medications   Medication Sig Start Date End Date Taking? Authorizing Provider  fenofibrate (TRICOR) 145 MG tablet Take 145 mg by mouth daily. 06/17/18   [provider]  ferrous sulfate 325 (65 FE) MG tablet Take 325 mg by mouth daily.    [provider]  furosemide (LASIX) 20 MG tablet Take 1 tablet (20 mg total) by mouth daily. 11/30/15   [name], MD  Milk Thistle 1000 MG CAPS Take 1,000 mg by mouth at bedtime.    [provider]  mometasone (ELOCON) 0.1 % lotion Place 4 drops into both ears daily as needed (ear irritation (flakes)).  08/02/15   [provider]  Multiple Vitamin (MULTIVITAMIN WITH MINERALS) TABS tablet Take 1 tablet by mouth at bedtime.    [provider]  pantoprazole (PROTONIX) 40 MG tablet Take 1 tablet (40 mg total) by mouth daily. 03/05/16   Ladene Artist,  MD  potassium chloride SA (K-DUR,KLOR-CON) 20 MEQ tablet TAKE 1 TABLET BY MOUTH TWO  TIMES DAILY Patient taking differently: Take 20 mEq by mouth 2 (two) times daily.  12/05/15   [name], MD  rifaximin (XIFAXAN) 550 MG TABS tablet Take 1 tablet (550 mg total) by mouth 2 (two) times daily. 03/16/18   Ladene Artist, MD  sildenafil (REVATIO) 20 MG tablet Take 40-100 mg by mouth daily as needed for erectile dysfunction. 09/27/18   [provider]  spironolactone (ALDACTONE) 50 MG tablet Take 1 tablet (50 mg total) by mouth daily. 07/23/16   Ladene Artist, MD  tetrahydrozoline 0.05 % ophthalmic solution Place 1 drop into both eyes daily.    [provider]  Thiamine HCl (VITAMIN B-1) 250 MG tablet Take 250 mg by mouth at bedtime.    [provider]  triamcinolone (KENALOG) 0.025 % cream Apply 1 application topically daily as needed (skin irritation).  06/12/16   [provider]  VENTOLIN HFA 108 (90 Base) MCG/ACT inhaler Inhale 2 puffs into the lungs every 6 (six) hours as needed for wheezing or shortness of breath.  07/28/16   [provider]   Physical Exam: Vitals with BMI 02/29/2020 02/29/2020 02/29/2020  Height - - -  Weight - - -  BMI - - -  Systolic [id number] 94 86  Diastolic 52 49 45  Pulse 66 57 59     1. General:  in No Acute distress   Chronically ill   -appearing 2. Psychological: Alert and   Oriented 3. Head/ENT:  Dry Mucous Membranes                          Head Non traumatic, neck supple                         Poor Dentition 4. SKIN:  decreased Skin turgor,  Skin clean Dry and intact no rash 5. Heart: Regular rate and rhythm no Murmur,  no Rub or gallop 6. Lungs:   no wheezes or crackles   7. Abdomen: Soft, non-tender, Non distended, hepatomegaly noted bowel sounds present 8. Lower extremities: no clubbing, cyanosis, no edema 9. Neurologically Grossly intact, moving all 4 extremities equally   10. MSK: Normal range of motion   All  other LABS:     Recent Labs  Lab 02/29/20 1949  WBC 3.7*  NEUTROABS 1.9  HGB 7.5*  HCT 22.7*  MCV 101.3*  PLT 67*     Recent Labs  Lab 02/29/20 1949  NA 137  K 3.1*  CL 111  CO2 19*  GLUCOSE 112*  BUN 10  CREATININE 1.17  CALCIUM 7.5*     Recent Labs  Lab 02/29/20 1949  AST 54*  ALT 31  ALKPHOS 87  BILITOT 1.8*  PROT 4.7*  ALBUMIN 2.3*       Cultures: No results found for: SDES, SPECREQUEST, CULT, REPTSTATUS   Radiological Exams on Admission: No results found.  Chart has been reviewed    Assessment/Plan   70 y.o. male with medical history significant of HTN, Anemia, GERD, HLD, asthma, CKD, depression, HTN, history of stroke, history of alcohol abuse History of alcohol liver disease w varices, MGUS, thrombocytopenia  Admitted for symptomatic anemia and hypotension in the setting of Hemoccult positive stools Present on Admission:  . Hypotension -in the setting of taking losartan.  Suspect patient has brittle blood pressure for now would avoid antihypertensives  . Prolonged QT interval - - will monitor on tele avoid QT prolonging medications, rehydrate correct electrolytes  . Symptomatic anemia ER provider ordered blood transfusion we will continue to follow CBC Repeat CBC Is up to 8.7 hold off on blood transfusion for now Obtain anemia panel Patient has history of hematuria in the past As well as slowly trending down hemoglobin over the past year or so. History of cirrhosis and thrombocytopenia Also history of MGUS  Slow GI bleed is also differential Consulted GI  . Occult blood in stools -consult GI, will see in a.m. Continue Protonix No history of melena along hematemesis.  Doubt variceal bleed. Normal BUN.   Suspect slow GI blood loss  . Thrombocytopenia ([place]) most likely secondary to cirrhosis  . OSA (obstructive sleep apnea) -not on CPAP   . Hypokalemia -- will replace and repeat in AM,  check magnesium level and replace as  needed  Hypomagnesemia - replace  . HLD (hyperlipidemia) resume home medications when able to talk   . GERD (gastroesophageal reflux disease) on PPI  . Essential hypertension, benign allow permissive hypertension  . Esophageal varices without bleeding (HCC) did not tolerate beta-blockers in the past   . 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  DVT prophylaxis:  SCD       Code Status:    Code Status: Prior FULL CODE   as per patient   I had personally discussed CODE STATUS with patient     Family Communication:   Family not at  Bedside   Disposition Plan: To home once workup is complete and patient is stable   Following barriers for  discharge:                            Electrolytes corrected                               Anemia corrected                             Will need to be able to tolerate PO                                              Will need consultants to evaluate patient prior to discharge                      Nutrition    consulted                  Consults called:   Dr. Alice Reichert thorugh Epic he is aware  Admission status:  ED Disposition    ED Disposition Condition [place]: [place] [[id number]]  Level of Care: Med-Surg [16]  Covid Evaluation: Asymptomatic Screening Protocol (No Symptoms)  Diagnosis: Hypotension [767209]  Admitting Physician: Toy Baker [[id number]]  Attending Physician: Toy Baker [[id number]]        Obs     Level of care     tele  For 24H     Lab Results   Component Value Date   [place] 10/07/2018     Precautions: admitted as  asymptomatic screening protocol    PPE: Used by the provider:   N95  eye Goggles,  Gloves     Darrion Wyszynski 02/29/2020, 11:51 PM    [facility]     after 2 AM please page floor coverage PA If 7AM-7PM, please contact the day team taking care of the patient using [email or web address]   Patient was evaluated in the context of the global COVID-19 pandemic, which necessitated consideration that the patient might be at risk for infection with the SARS-CoV-2 virus that causes COVID-19. Institutional protocols and algorithms that pertain to the evaluation of patients at risk for COVID-19 are in a state of rapid change based on information released by regulatory bodies including the CDC and federal and state organizations. These policies and algorithms were followed during the patient's care.

## 2020-02-29 NOTE — ED Triage Notes (Signed)
Pt reportedly checked bp at home with suspected old meter. Initial reading at home 230/180 and patient took losartan. On EMS arrival pt bp in 09'U systolic and pt reported feeling dizzy and weak. Denies chest pain or pressure or syncopal event. Continues to report weakness. Last bp with ems 43'C systolic after 1L [facility] admin en route.

## 2020-02-29 NOTE — H&P (Addendum)
  Jeff Ward MRN:[id number] DOB: 07/12/1950 DOA: 02/29/2020     PCP: Klein, Bert J III, MD   Outpatient Specialists:     GI  Dr. Stark    [facility]    Patient arrived to ER on 02/29/20 at 1945 Referred by Attending Paduchowski, Kevin, MD   Patient coming from: home Lives  With family    Chief Complaint:   Chief Complaint  Patient presents with  . Hypotension    HPI: Jeff Ward is a 69 y.o. male with medical history significant of HTN, Anemia, GERD, HLD, asthma, CKD, depression, HTN, history of stroke, history of alcohol abuse History of alcohol liver disease w varices, MGUS, thrombocytopenia  Presented with   abnormal blood pressure reading.  Today patient took his blood pressure at home as he usually does and show to be 230s systolics he became concerned and took a dose of losartan he try to check it again but it was reading his error and became dizzy.  And EMS was called and on their arrival systolics was in 50s patient was given 1 L in the upon arrival to [place] emergency department blood pressure was 90/47. Patient denies any chest pain shortness of breath  Patient has history of rapidly fluctuating blood pressures In the past have had a trial of amlodipine but was taken off secondary to edema they tried losartan and diuretics but patient became significantly dehydrated and hypotensive with creatinine going up to 3.5 at which point there was stopped. His primary care provider recommended using losartan only when his blood pressure goes up above 200  Regarding patient cirrhosis and esophageal disease he could not tolerate beta-blockers in the past secondary to bradycardia and hypotension supposed to follow-up with Dr. Stark  He is followed for his MGUS with hematology periodically.  Patient is known chronic thrombocytopenia which felt to be secondary to cirrhosis  Denies any black stool no blood in his stool    Infectious risk factors:  Reports  fatigue      Has  been  vaccinated against COVID in July not boosted   Initial COVID TEST   positive  Lab Results  Component Value Date   SARSCOV2NAA POSITIVE (A) 02/29/2020   SARSCOV2NAA NEGATIVE 10/07/2018    Regarding pertinent Chronic problems:     Hyperlipidemia -  on  [facility] Lipid Panel     Component Value Date/Time   CHOL 119 01/24/2016 1230   CHOL 193 12/20/2013 0907   TRIG 111.0 01/24/2016 1230   HDL 24.50 (L) 01/24/2016 1230   HDL 35 (L) 12/20/2013 0907   CHOLHDL 5 01/24/2016 1230   VLDL 22.2 01/24/2016 1230   LDLCALC 72 01/24/2016 1230   LDLCALC 133 (H) 12/20/2013 0907   LABVLDL 25 12/20/2013 0907     HTN on Losartan says PRN SBP >200       Asthma -well  controlled on home inhalers      OSA - not on CPAP     Hx of CVA -   with/out residual deficits   History of GI bleed and varices.  On Protonix    CKD stage II - baseline Cr 1.1 Estimated Creatinine Clearance: 57.8 mL/min (by C-G formula based on SCr of 1.17 mg/dL).  Lab Results  Component Value Date   CREATININE 1.17 02/29/2020   CREATININE 0.90 03/31/2019   CREATININE 1.14 07/19/2018      Liver disease Computed MELD-Na score unavailable. Necessary lab results were not found in the last   year. Computed MELD score unavailable. Necessary lab results were not found in the last year.    Chronic anemia - baseline hg Hemoglobin & Hematocrit  Recent Labs    02/29/20 1949  HGB 7.5*     While in ER: Was given additional IV fluids Hemoglobin noted to be down to 7.5 Last hemoglobin was 9.5 in January 22 Has been trending down for past several months. Hemoccult positive but stool was brown. ER provider ordered 2 units packed red blood cells Protonix bolus and infusion  Noted mildly elevated troponin EKG nonischemic    Hospitalist was called for admission for symptomatic anemia  The following Work up has been ordered so far:  Orders Placed This Encounter  Procedures  . CBC with Differential  . Comprehensive  metabolic panel  . Informed Consent Details: Physician/Practitioner Attestation; Transcribe to consent form and obtain patient signature  . Consult to hospitalist  ALL PATIENTS BEING ADMITTED/HAVING PROCEDURES NEED COVID-19 SCREENING  . EKG 12-Lead  . EKG 12-Lead  . Type and screen [place]  . Prepare RBC (crossmatch)     Following Medications were ordered in ER: Medications  0.9 %  sodium chloride infusion (has no administration in time range)  pantoprazole (PROTONIX) 80 mg in sodium chloride 0.9 % 100 mL IVPB (has no administration in time range)  pantoprazole (PROTONIX) 80 mg in sodium chloride 0.9 % 100 mL (0.8 mg/mL) infusion (has no administration in time range)  sodium chloride 0.9 % bolus 500 mL (0 mLs Intravenous Stopped 02/29/20 2027)  sodium chloride 0.9 % bolus 500 mL (0 mLs Intravenous Stopped 02/29/20 2148)        Consult Orders  (From admission, onward)         Start     Ordered   02/29/20 2144  Consult to hospitalist  ALL PATIENTS BEING ADMITTED/HAVING PROCEDURES NEED COVID-19 SCREENING  Once       Comments: ALL PATIENTS BEING ADMITTED/HAVING PROCEDURES NEED COVID-19 SCREENING  Provider:  (Not yet assigned)  Question Answer Comment  Place call to: [facility]   Reason for Consult Admit   Diagnosis/Clinical Info for Consult: hypotension, gib      02/29/20 2143          Significant initial  Findings: Abnormal Labs Reviewed  CBC WITH DIFFERENTIAL/PLATELET - Abnormal; Notable for the following components:      Result Value   WBC 3.7 (*)    RBC 2.24 (*)    Hemoglobin 7.5 (*)    HCT 22.7 (*)    MCV 101.3 (*)    Platelets 67 (*)    All other components within normal limits  COMPREHENSIVE METABOLIC PANEL - Abnormal; Notable for the following components:   Potassium 3.1 (*)    CO2 19 (*)    Glucose, Bld 112 (*)    Calcium 7.5 (*)    Total Protein 4.7 (*)    Albumin 2.3 (*)    AST 54 (*)    Total Bilirubin 1.8 (*)    All other components  within normal limits  TROPONIN I (HIGH SENSITIVITY) - Abnormal; Notable for the following components:   Troponin I (High Sensitivity) 44 (*)    All other components within normal limits     Otherwise labs showing:  Recent Labs  Lab 02/29/20 1949  NA 137  K 3.1*  CO2 19*  GLUCOSE 112*  BUN 10  CREATININE 1.17  CALCIUM 7.5*    Cr   stable,    Lab  Results  Component Value Date   CREATININE 1.17 02/29/2020   CREATININE 0.90 03/31/2019   CREATININE 1.14 07/19/2018    Recent Labs  Lab 02/29/20 1949  AST 54*  ALT 31  ALKPHOS 87  BILITOT 1.8*  PROT 4.7*  ALBUMIN 2.3*   Lab Results  Component Value Date   CALCIUM 7.5 (L) 02/29/2020   PHOS 3.6 07/29/2015   WBC      Component Value Date/Time   WBC 3.7 (L) 02/29/2020 1949   LYMPHSABS 0.9 02/29/2020 1949   LYMPHSABS 1.7 12/20/2013 0907   MONOABS 0.6 02/29/2020 1949   EOSABS 0.3 02/29/2020 1949   EOSABS 0.4 12/20/2013 0907   BASOSABS 0.0 02/29/2020 1949   BASOSABS 0.0 12/20/2013 0907    Plt: Lab Results  Component Value Date   PLT 67 (L) 02/29/2020        COVID-19 Labs  No results for input(s): DDIMER, FERRITIN, LDH, CRP in the last 72 hours.  Lab Results  Component Value Date   [place] NEGATIVE 10/07/2018       HG/HCT   Down   from baseline see below    Component Value Date/Time   HGB 7.5 (L) 02/29/2020 1949   HCT 22.7 (L) 02/29/2020 1949   MCV 101.3 (H) 02/29/2020 1949    No results for input(s): LIPASE, AMYLASE in the last 168 hours. Recent Labs  Lab 02/29/20 2213  AMMONIA 45*       Troponin 44 -42 Cardiac Panel (last 3 results) No results for input(s): CKTOTAL, CKMB, TROPONINI, RELINDX in the last 72 hours.     ECG: Ordered Personally reviewed by me showing: HR : 65 Rhythm:  NSR,    no evidence of ischemic changes QTC 514          UA ordered   Urine analysis:    Component Value Date/Time   COLORURINE STRAW (A) 11/23/2015 1605   APPEARANCEUR CLEAR (A) 11/23/2015 1605    LABSPEC 1.008 11/23/2015 1605   PHURINE 5.0 11/23/2015 1605   GLUCOSEU NEGATIVE 11/23/2015 1605   HGBUR 1+ (A) 11/23/2015 1605   BILIRUBINUR NEGATIVE 11/23/2015 1605   KETONESUR NEGATIVE 11/23/2015 1605   PROTEINUR NEGATIVE 11/23/2015 1605   NITRITE NEGATIVE 11/23/2015 1605   LEUKOCYTESUR NEGATIVE 11/23/2015 1605       ED Triage Vitals  Enc Vitals Group     BP 02/29/20 1950 (!) 90/47     Pulse Rate 02/29/20 1950 70     Resp 02/29/20 1950 18     Temp 02/29/20 1954 98.1 F (36.7 C)     Temp Source 02/29/20 1950 Oral     SpO2 02/29/20 1950 100 %     Weight 02/29/20 1951 180 lb (81.6 kg)     Height 02/29/20 1951 5\' 5"  (1.651 m)     Head Circumference --      Peak Flow --      Pain Score 02/29/20 1950 0     Pain Loc --      Pain Edu? --      Excl. in [place]? --   TMAX(24)@       Latest  Blood pressure (!) 108/52, pulse 66, temperature 98.1 F (36.7 C), temperature source Oral, resp. rate 17, height 5\' 5"  (1.651 m), weight 81.6 kg, SpO2 100 %.       Review of Systems:    Pertinent positives include:  , fatigue, dizziness,  Constitutional:  No weight loss, night sweats, Fevers, chills, weight loss  HEENT:  No headaches, Difficulty swallowing,Tooth/dental problems,Sore throat,  No sneezing, itching, ear ache, nasal congestion, post nasal drip,  Cardio-vascular:  No chest pain, Orthopnea, PND, anasarca palpitations.no Bilateral lower extremity swelling  GI:  No heartburn, indigestion, abdominal pain, nausea, vomiting, diarrhea, change in bowel habits, loss of appetite, melena, blood in stool, hematemesis Resp:  no shortness of breath at rest. No dyspnea on exertion, No excess mucus, no productive cough, No non-productive cough, No coughing up of blood.No change in color of mucus.No wheezing. Skin:  no rash or lesions. No jaundice GU:  no dysuria, change in color of urine, no urgency or frequency. No straining to urinate.  No flank pain.  Musculoskeletal:  No joint pain or  no joint swelling. No decreased range of motion. No back pain.  Psych:  No change in mood or affect. No depression or anxiety. No memory loss.  Neuro: no localizing neurological complaints, no tingling, no weakness, no double vision, no gait abnormality, no slurred speech, no confusion  All systems reviewed and apart from [place] all are negative  Past Medical History:   Past Medical History:  Diagnosis Date  . Allergy    SEASONAL  . Anemia   . Anxiety   . Arthritis    In back and all joints  . Asthma   . Chronic headache    now resolved  . CKD (chronic kidney disease)   . 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 Depression   . Esophageal varices ([place])   . GERD (gastroesophageal reflux disease)   . GI bleed   . Hiatal hernia   . History of kidney stones 1970's  . Hyperlipidemia 06/2002  . Hypertension   . Liver disease   . Psoriasis   . Stroke ([place])    PT. STATED MILD  . Substance abuse ([place])    H/O ETOH      Past Surgical History:  Procedure Laterality Date  . CARPAL TUNNEL RELEASE  2005    / right hand  . COLONOSCOPY     march 2016  . ESOPHAGEAL BANDING  09/22/2017   Procedure: ESOPHAGEAL BANDING;  Surgeon: Ladene Artist, MD;  Location: [facility];  Service: Endoscopy;;  . ESOPHAGEAL BANDING  10/11/2018   Procedure: ESOPHAGEAL BANDING;  Surgeon: Ladene Artist, MD;  Location: [facility] ENDOSCOPY;  Service: Endoscopy;;  . ESOPHAGOGASTRODUODENOSCOPY N/A 07/24/2015   Procedure: ESOPHAGOGASTRODUODENOSCOPY (EGD);  Surgeon: Lucilla Lame, MD;  Location: [facility] ENDOSCOPY;  Service: Endoscopy;  Laterality: N/A;  Banding  . ESOPHAGOGASTRODUODENOSCOPY (EGD) WITH PROPOFOL N/A 08/14/2015   Procedure: ESOPHAGOGASTRODUODENOSCOPY (EGD) WITH PROPOFOL;  Surgeon: Ladene Artist, MD;  Location: [facility] ENDOSCOPY;  Service: Endoscopy;  Laterality: N/A;  . ESOPHAGOGASTRODUODENOSCOPY (EGD) WITH PROPOFOL N/A 09/22/2017   Procedure: ESOPHAGOGASTRODUODENOSCOPY (EGD) WITH PROPOFOL;   Surgeon: Ladene Artist, MD;  Location: [facility] ENDOSCOPY;  Service: Endoscopy;  Laterality: N/A;  . ESOPHAGOGASTRODUODENOSCOPY (EGD) WITH PROPOFOL N/A 10/11/2018   Procedure: ESOPHAGOGASTRODUODENOSCOPY (EGD) WITH PROPOFOL;  Surgeon: Ladene Artist, MD;  Location: [facility] ENDOSCOPY;  Service: Endoscopy;  Laterality: N/A;  . TONSILLECTOMY AND ADENOIDECTOMY     70 years old  . VASECTOMY      Social History:  Ambulatory   Independently      reports that he quit smoking about 9 years ago. His smoking use included cigars. He quit after 35.00 years of use. He has never used smokeless tobacco. He reports that he does not drink alcohol and does not use drugs.   Family History:  Family History  Problem Relation Age of Onset  . CAD Mother   . CVA Mother   . Heart disease Mother   . Melanoma Father   . CVA Father   . Liver disease Brother   . Breast cancer Paternal Aunt   . Alcohol abuse Maternal Grandfather     Allergies: Allergies  Allergen Reactions  . Bee Venom Anaphylaxis  . Sulfa Antibiotics Other (See Comments)    Reaction:  Fainting      Prior to Admission medications   Medication Sig Start Date End Date Taking? Authorizing Provider  fenofibrate (TRICOR) 145 MG tablet Take 145 mg by mouth daily. 06/17/18   [provider]  ferrous sulfate 325 (65 FE) MG tablet Take 325 mg by mouth daily.    [provider]  furosemide (LASIX) 20 MG tablet Take 1 tablet (20 mg total) by mouth daily. 11/30/15   [name], MD  Milk Thistle 1000 MG CAPS Take 1,000 mg by mouth at bedtime.    [provider]  mometasone (ELOCON) 0.1 % lotion Place 4 drops into both ears daily as needed (ear irritation (flakes)).  08/02/15   [provider]  Multiple Vitamin (MULTIVITAMIN WITH MINERALS) TABS tablet Take 1 tablet by mouth at bedtime.    [provider]  pantoprazole (PROTONIX) 40 MG tablet Take 1 tablet (40 mg total) by mouth daily. 03/05/16   Ladene Artist,  MD  potassium chloride SA (K-DUR,KLOR-CON) 20 MEQ tablet TAKE 1 TABLET BY MOUTH TWO  TIMES DAILY Patient taking differently: Take 20 mEq by mouth 2 (two) times daily.  12/05/15   [name], MD  rifaximin (XIFAXAN) 550 MG TABS tablet Take 1 tablet (550 mg total) by mouth 2 (two) times daily. 03/16/18   Ladene Artist, MD  sildenafil (REVATIO) 20 MG tablet Take 40-100 mg by mouth daily as needed for erectile dysfunction. 09/27/18   [provider]  spironolactone (ALDACTONE) 50 MG tablet Take 1 tablet (50 mg total) by mouth daily. 07/23/16   Ladene Artist, MD  tetrahydrozoline 0.05 % ophthalmic solution Place 1 drop into both eyes daily.    [provider]  Thiamine HCl (VITAMIN B-1) 250 MG tablet Take 250 mg by mouth at bedtime.    [provider]  triamcinolone (KENALOG) 0.025 % cream Apply 1 application topically daily as needed (skin irritation).  06/12/16   [provider]  VENTOLIN HFA 108 (90 Base) MCG/ACT inhaler Inhale 2 puffs into the lungs every 6 (six) hours as needed for wheezing or shortness of breath.  07/28/16   [provider]   Physical Exam: Vitals with BMI 02/29/2020 02/29/2020 02/29/2020  Height - - -  Weight - - -  BMI - - -  Systolic [id number] 94 86  Diastolic 52 49 45  Pulse 66 57 59     1. General:  in No Acute distress   Chronically ill   -appearing 2. Psychological: Alert and   Oriented 3. Head/ENT:  Dry Mucous Membranes                          Head Non traumatic, neck supple                         Poor Dentition 4. SKIN:  decreased Skin turgor,  Skin clean Dry and intact no rash 5. Heart: Regular rate and rhythm no Murmur,  no Rub or gallop 6. Lungs:   no wheezes or crackles   7. Abdomen: Soft, non-tender, Non distended, hepatomegaly noted bowel sounds present 8. Lower extremities: no clubbing, cyanosis, no edema 9. Neurologically Grossly intact, moving all 4 extremities equally   10. MSK: Normal range of motion   All  other LABS:     Recent Labs  Lab 02/29/20 1949  WBC 3.7*  NEUTROABS 1.9  HGB 7.5*  HCT 22.7*  MCV 101.3*  PLT 67*     Recent Labs  Lab 02/29/20 1949  NA 137  K 3.1*  CL 111  CO2 19*  GLUCOSE 112*  BUN 10  CREATININE 1.17  CALCIUM 7.5*     Recent Labs  Lab 02/29/20 1949  AST 54*  ALT 31  ALKPHOS 87  BILITOT 1.8*  PROT 4.7*  ALBUMIN 2.3*       Cultures: No results found for: SDES, SPECREQUEST, CULT, REPTSTATUS   Radiological Exams on Admission: No results found.  Chart has been reviewed    Assessment/Plan   70 y.o. male with medical history significant of HTN, Anemia, GERD, HLD, asthma, CKD, depression, HTN, history of stroke, history of alcohol abuse History of alcohol liver disease w varices, MGUS, thrombocytopenia  Admitted for symptomatic anemia and hypotension in the setting of Hemoccult positive stools Present on Admission:  . Hypotension -in the setting of taking losartan.  Suspect patient has brittle blood pressure for now would avoid antihypertensives  . Prolonged QT interval - - will monitor on tele avoid QT prolonging medications, rehydrate correct electrolytes  . Symptomatic anemia ER provider ordered blood transfusion we will continue to follow CBC Repeat CBC Is up to 8.7 hold off on blood transfusion for now Obtain anemia panel Patient has history of hematuria in the past As well as slowly trending down hemoglobin over the past year or so. History of cirrhosis and thrombocytopenia Also history of MGUS  Slow GI bleed is also differential Consulted GI  . Occult blood in stools -consult GI, will see in a.m. Continue Protonix No history of melena along hematemesis.  Doubt variceal bleed. Normal BUN.   Suspect slow GI blood loss  . Thrombocytopenia ([place]) most likely secondary to cirrhosis  . OSA (obstructive sleep apnea) -not on CPAP   . Hypokalemia -- will replace and repeat in AM,  check magnesium level and replace as  needed  Hypomagnesemia - replace  . HLD (hyperlipidemia) resume home medications when able to talk   . GERD (gastroesophageal reflux disease) on PPI  . Essential hypertension, benign allow permissive hypertension  . Esophageal varices without bleeding (HCC) did not tolerate beta-blockers in the past   . 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  DVT prophylaxis:  SCD       Code Status:    Code Status: Prior FULL CODE   as per patient   I had personally discussed CODE STATUS with patient     Family Communication:   Family not at  Bedside   Disposition Plan: To home once workup is complete and patient is stable   Following barriers for  discharge:                            Electrolytes corrected                               Anemia corrected                             Will need to be able to tolerate PO                                              Will need consultants to evaluate patient prior to discharge                      Nutrition    consulted                  Consults called:   Dr. Alice Reichert thorugh Epic he is aware  Admission status:  ED Disposition    ED Disposition Condition [place]: [place] [[id number]]  Level of Care: Med-Surg [16]  Covid Evaluation: Asymptomatic Screening Protocol (No Symptoms)  Diagnosis: Hypotension [767209]  Admitting Physician: Toy Baker [[id number]]  Attending Physician: Toy Baker [[id number]]        Obs     Level of care     tele  For 24H     Lab Results   Component Value Date   [place] 10/07/2018     Precautions: admitted as  asymptomatic screening protocol    PPE: Used by the provider:   N95  eye Goggles,  Gloves     Triston Lisanti 02/29/2020, 11:51 PM    [facility]     after 2 AM please page floor coverage PA If 7AM-7PM, please contact the day team taking care of the patient using [email or web address]   Patient was evaluated in the context of the global COVID-19 pandemic, which necessitated consideration that the patient might be at risk for infection with the SARS-CoV-2 virus that causes COVID-19. Institutional protocols and algorithms that pertain to the evaluation of patients at risk for COVID-19 are in a state of rapid change based on information released by regulatory bodies including the CDC and federal and state organizations. These policies and algorithms were followed during the patient's care.

## 2020-02-29 NOTE — ED Provider Notes (Signed)
[facility] Emergency Department Provider Note  Time seen: 8:19 PM  I have reviewed the triage vital signs and the nursing notes.   HISTORY  Chief Complaint Hypotension   HPI Jeff Ward is a 70 y.o. male with a past medical history of hypertension, anxiety, anemia, gastric reflux, hyperlipidemia, presents to the emergency department for evaluation.  According to the patient he took his blood pressure earlier tonight and it was greater than 761 systolic.  Patient states he routinely checks his blood pressure, has a home blood pressure cuff.  Since his blood pressure was too high he took losartan at home and called EMS.  He tried to recheck his blood pressure but it kept reading as "error."   EMS states upon arrival patient's blood pressure was in the 60V systolic and he was feeling dizzy and weak.  They gave the patient 1 L of normal saline, upon arrival blood pressure 90/47.  Currently patient states he feels well denies any weakness or dizziness.  No chest pain.  Negative review of systems.  Past Medical History:  Diagnosis Date  . Allergy    SEASONAL  . Anemia   . Anxiety   . Arthritis    In back and all joints  . Asthma   . Chronic headache    now resolved  . CKD (chronic kidney disease)   . 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 Depression   . Esophageal varices ([place])   . GERD (gastroesophageal reflux disease)   . GI bleed   . Hiatal hernia   . History of kidney stones 1970's  . Hyperlipidemia 06/2002  . Hypertension   . Liver disease   . Psoriasis   . Stroke ([place])    PT. STATED MILD  . Substance abuse ([place])    H/O ETOH    Patient Active Problem List   Diagnosis Date Noted  . SI (sacroiliac) joint inflammation ([place]) 03/31/2016  . Encephalopathy, hepatic ([place]) 03/05/2016  . Esophageal varices without bleeding ([place]) 03/05/2016  . Right foot pain 01/31/2016  . Hyperkalemia 01/31/2016  . Alcoholic cirrhosis of liver  without ascites ([place]) 08/06/2015  . Secondary esophageal varices with bleeding ([place])   . Alcoholic cirrhosis ([place]) 37/10/6267  . Liver cirrhosis ([place]) 06/19/2015  . GERD (gastroesophageal reflux disease) 03/22/2015  . Personal history of colonic polyps 04/27/2013  . Depression 02/17/2013  . Hyperglycemia 07/23/2011  . OSA (obstructive sleep apnea) 04/10/2011  . Testosterone deficiency 03/06/2011  . Degenerative disc disease, lumbar 05/09/2010  . OTHER NONSPECIFIC ABNORMAL SERUM ENZYME LEVELS 05/23/2009  . Essential hypertension, benign 09/01/2007  . COLONIC POLYPS 10/23/2006  . HLD (hyperlipidemia) 10/23/2006  . PUD 10/23/2006  . 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 CARPAL TUNNEL SYNDROME, BILATERAL, HX OF 10/23/2006  . BENIGN PROSTATIC HYPERTROPHY, HX OF 10/23/2006    Past Surgical History:  Procedure Laterality Date  . CARPAL TUNNEL RELEASE  2005    / right hand  . COLONOSCOPY     march 2016  . ESOPHAGEAL BANDING  09/22/2017   Procedure: ESOPHAGEAL BANDING;  Surgeon: Ladene Artist, MD;  Location: [facility];  Service: Endoscopy;;  . ESOPHAGEAL BANDING  10/11/2018   Procedure: ESOPHAGEAL BANDING;  Surgeon: Ladene Artist, MD;  Location: [facility];  Service: Endoscopy;;  . ESOPHAGOGASTRODUODENOSCOPY N/A 07/24/2015   Procedure: ESOPHAGOGASTRODUODENOSCOPY (EGD);  Surgeon: Lucilla Lame, MD;  Location: [facility] ENDOSCOPY;  Service: Endoscopy;  Laterality: N/A;  Banding  . ESOPHAGOGASTRODUODENOSCOPY (EGD) WITH PROPOFOL  N/A 08/14/2015   Procedure: ESOPHAGOGASTRODUODENOSCOPY (EGD) WITH PROPOFOL;  Surgeon: Ladene Artist, MD;  Location: [facility];  Service: Endoscopy;  Laterality: N/A;  . ESOPHAGOGASTRODUODENOSCOPY (EGD) WITH PROPOFOL N/A 09/22/2017   Procedure: ESOPHAGOGASTRODUODENOSCOPY (EGD) WITH PROPOFOL;  Surgeon: Ladene Artist, MD;  Location: [facility];  Service: Endoscopy;  Laterality: N/A;  . ESOPHAGOGASTRODUODENOSCOPY (EGD) WITH PROPOFOL N/A 10/11/2018    Procedure: ESOPHAGOGASTRODUODENOSCOPY (EGD) WITH PROPOFOL;  Surgeon: Ladene Artist, MD;  Location: [facility] ENDOSCOPY;  Service: Endoscopy;  Laterality: N/A;  . TONSILLECTOMY AND ADENOIDECTOMY     69 years old  . VASECTOMY      Prior to Admission medications   Medication Sig Start Date End Date Taking? Authorizing Provider  fenofibrate (TRICOR) 145 MG tablet Take 145 mg by mouth daily. 06/17/18   [provider]  ferrous sulfate 325 (65 FE) MG tablet Take 325 mg by mouth daily.    [provider]  furosemide (LASIX) 20 MG tablet Take 1 tablet (20 mg total) by mouth daily. 11/30/15   [name], MD  Milk Thistle 1000 MG CAPS Take 1,000 mg by mouth at bedtime.    [provider]  mometasone (ELOCON) 0.1 % lotion Place 4 drops into both ears daily as needed (ear irritation (flakes)).  08/02/15   [provider]  Multiple Vitamin (MULTIVITAMIN WITH MINERALS) TABS tablet Take 1 tablet by mouth at bedtime.    [provider]  pantoprazole (PROTONIX) 40 MG tablet Take 1 tablet (40 mg total) by mouth daily. 03/05/16   Ladene Artist, MD  potassium chloride SA (K-DUR,KLOR-CON) 20 MEQ tablet TAKE 1 TABLET BY MOUTH TWO  TIMES DAILY Patient taking differently: Take 20 mEq by mouth 2 (two) times daily.  12/05/15   [name], MD  rifaximin (XIFAXAN) 550 MG TABS tablet Take 1 tablet (550 mg total) by mouth 2 (two) times daily. 03/16/18   Ladene Artist, MD  sildenafil (REVATIO) 20 MG tablet Take 40-100 mg by mouth daily as needed for erectile dysfunction. 09/27/18   [provider]  spironolactone (ALDACTONE) 50 MG tablet Take 1 tablet (50 mg total) by mouth daily. 07/23/16   Ladene Artist, MD  tetrahydrozoline 0.05 % ophthalmic solution Place 1 drop into both eyes daily.    [provider]  Thiamine HCl (VITAMIN B-1) 250 MG tablet Take 250 mg by mouth at bedtime.    [provider]  triamcinolone (KENALOG) 0.025 % cream Apply 1  application topically daily as needed (skin irritation).  06/12/16   [provider]  VENTOLIN HFA 108 (90 Base) MCG/ACT inhaler Inhale 2 puffs into the lungs every 6 (six) hours as needed for wheezing or shortness of breath.  07/28/16   [provider]    Allergies  Allergen Reactions  . Bee Venom Anaphylaxis  . Sulfa Antibiotics Other (See Comments)    Reaction:  Fainting     Family History  Problem Relation Age of Onset  . CAD Mother   . CVA Mother   . Heart disease Mother   . Melanoma Father   . CVA Father   . Liver disease Brother   . Breast cancer Paternal Aunt   . Alcohol abuse Maternal Grandfather     Social History Social History   Tobacco Use  . Smoking status: Former Smoker    Years: 35.00    Types: Cigars    Quit date: 04/02/2010    Years since quitting: 9.9  . Smokeless  tobacco: Never Used  . Tobacco comment: Quit 4 years ago  Vaping Use  . Vaping Use: Never used  Substance Use Topics  . Alcohol use: No    Alcohol/week: 2.0 standard drinks    Types: 2 Cans of beer per week    Comment: Used to be a heavy drinker, quit for 5 months now  . Drug use: No    Review of Systems Constitutional: Negative for fever.  Dizziness, now resolved Cardiovascular: Negative for chest pain. Respiratory: Negative for shortness of breath. Gastrointestinal: Negative for abdominal pain, vomiting and diarrhea. Musculoskeletal: Negative for musculoskeletal complaints Skin: Negative for skin complaints  Neurological: Negative for headache All other ROS negative  ____________________________________________   PHYSICAL EXAM:  VITAL SIGNS: ED Triage Vitals  Enc Vitals Group     BP 02/29/20 1950 (!) 90/47     Pulse Rate 02/29/20 1950 70     Resp 02/29/20 1950 18     Temp 02/29/20 1954 98.1 F (36.7 C)     Temp Source 02/29/20 1950 Oral     SpO2 02/29/20 1950 100 %     Weight 02/29/20 1951 180 lb (81.6 kg)     Height 02/29/20 1951 5\' 5"  (1.651 m)      Head Circumference --      Peak Flow --      Pain Score 02/29/20 1950 0     Pain Loc --      Pain Edu? --      Excl. in [place]? --     Constitutional: Alert and oriented. Well appearing and in no distress. Eyes: Normal exam ENT      Head: Normocephalic and atraumatic.      Mouth/Throat: Mucous membranes are moist. Cardiovascular: Normal rate, regular rhythm.  Respiratory: Normal respiratory effort without tachypnea nor retractions. Breath sounds are clear Gastrointestinal: Soft and nontender. No distention.   Musculoskeletal: Nontender with normal range of motion in all extremities Neurologic:  Normal speech and language. No gross focal neurologic deficits Skin:  Skin is warm, dry and intact.  Psychiatric: Mood and affect are normal.   ____________________________________________    EKG  EKG viewed and interpreted by myself shows normal sinus rhythm at 65 bpm with a narrow QRS, normal axis, normal intervals besides slight QTC prolongation, nonspecific ST changes no ST elevation.  ____________________________________________     INITIAL IMPRESSION / ASSESSMENT AND PLAN / ED COURSE  Pertinent labs & imaging results that were available during my care of the patient were reviewed by me and considered in my medical decision making (see chart for details).   Patient presents emergency department initially for hypertension, took losartan and states his blood pressure is now dropped.  Was initially very low is now up to 90 systolic upon arrival.  We will check labs including cardiac enzymes, kidney function, EKG.  We will dose additional 500 cc of normal saline and continue to closely monitor.  Patient agreeable to plan of care.  Overall patient appears well.  States this is happened multiple times in the past as well.  Patient's labs have resulted showing a hemoglobin of 7.5. Per record review this is down from close to 14 2 years ago, per care everywhere it appears that this has been  trending down over the past several months. Rectal examination shows light brown stool but it is strongly guaiac positive. Suspect likely slow GI bleed. Patient receiving IV fluids, blood pressure around 90 systolic currently. I have consented the patient for blood  products we will order 2 units of packed red blood cells. We will start the patient on Protonix bolus and infusion and admit to the hospitalist service. Patient's troponin is mildly elevated suspect this is likely related to the patient significant anemia, no concerning findings on EKG.  Jeff Ward was evaluated in Emergency Department on 02/29/2020 for the symptoms described in the history of present illness. He was evaluated in the context of the global COVID-19 pandemic, which necessitated consideration that the patient might be at risk for infection with the SARS-CoV-2 virus that causes COVID-19. Institutional protocols and algorithms that pertain to the evaluation of patients at risk for COVID-19 are in a state of rapid change based on information released by regulatory bodies including the CDC and federal and state organizations. These policies and algorithms were followed during the patient's care in the ED.  CRITICAL CARE Performed by: Harvest Dark   Total critical care time: 30 minutes  Critical care time was exclusive of separately billable procedures and treating other patients.  Critical care was necessary to treat or prevent imminent or life-threatening deterioration.  Critical care was time spent personally by me on the following activities: development of treatment plan with patient and/or surrogate as well as nursing, discussions with consultants, evaluation of patient's response to treatment, examination of patient, obtaining history from patient or surrogate, ordering and performing treatments and interventions, ordering and review of laboratory studies, ordering and review of radiographic studies, pulse oximetry and  re-evaluation of patient's condition.   ____________________________________________   FINAL CLINICAL IMPRESSION(S) / ED DIAGNOSES  Hypotension GI bleed   Harvest Dark, MD 02/29/20 2145

## 2020-03-01 DIAGNOSIS — U071 COVID-19: Secondary | ICD-10-CM | POA: Diagnosis not present

## 2020-03-01 DIAGNOSIS — D649 Anemia, unspecified: Secondary | ICD-10-CM | POA: Diagnosis not present

## 2020-03-01 DIAGNOSIS — D696 Thrombocytopenia, unspecified: Secondary | ICD-10-CM | POA: Diagnosis not present

## 2020-03-01 DIAGNOSIS — R69 Illness, unspecified: Secondary | ICD-10-CM | POA: Diagnosis not present

## 2020-03-01 DIAGNOSIS — R195 Other fecal abnormalities: Secondary | ICD-10-CM | POA: Diagnosis not present

## 2020-03-01 DIAGNOSIS — K7030 Alcoholic cirrhosis of liver without ascites: Secondary | ICD-10-CM | POA: Diagnosis not present

## 2020-03-01 LAB — CBC WITH DIFFERENTIAL/PLATELET
Abs Immature Granulocytes: 0.01 10*3/uL (ref 0.00–0.07)
Basophils Absolute: 0 10*3/uL (ref 0.0–0.1)
Basophils Relative: 1 %
Eosinophils Absolute: 0.2 10*3/uL (ref 0.0–0.5)
Eosinophils Relative: 7 %
HCT: 25.5 % — ABNORMAL LOW (ref 39.0–52.0)
Hemoglobin: 8.8 g/dL — ABNORMAL LOW (ref 13.0–17.0)
Immature Granulocytes: 0 %
Lymphocytes Relative: 24 %
Lymphs Abs: 0.6 10*3/uL — ABNORMAL LOW (ref 0.7–4.0)
MCH: 34.4 pg — ABNORMAL HIGH (ref 26.0–34.0)
MCHC: 34.5 g/dL (ref 30.0–36.0)
MCV: 99.6 fL (ref 80.0–100.0)
Monocytes Absolute: 0.3 10*3/uL (ref 0.1–1.0)
Monocytes Relative: 13 %
Neutro Abs: 1.3 10*3/uL — ABNORMAL LOW (ref 1.7–7.7)
Neutrophils Relative %: 55 %
Platelets: 62 10*3/uL — ABNORMAL LOW (ref 150–400)
RBC: 2.56 MIL/uL — ABNORMAL LOW (ref 4.22–5.81)
RDW: 14.7 % (ref 11.5–15.5)
WBC: 2.4 10*3/uL — ABNORMAL LOW (ref 4.0–10.5)
nRBC: 0 % (ref 0.0–0.2)

## 2020-03-01 LAB — COMPREHENSIVE METABOLIC PANEL
ALT: 31 U/L (ref 0–44)
AST: 52 U/L — ABNORMAL HIGH (ref 15–41)
Albumin: 2.3 g/dL — ABNORMAL LOW (ref 3.5–5.0)
Alkaline Phosphatase: 86 U/L (ref 38–126)
Anion gap: 6 (ref 5–15)
BUN: 10 mg/dL (ref 8–23)
CO2: 20 mmol/L — ABNORMAL LOW (ref 22–32)
Calcium: 7.5 mg/dL — ABNORMAL LOW (ref 8.9–10.3)
Chloride: 111 mmol/L (ref 98–111)
Creatinine, Ser: 1 mg/dL (ref 0.61–1.24)
GFR, Estimated: >60 mL/min (ref >60)
Glucose, Bld: 127 mg/dL — ABNORMAL HIGH (ref 70–99)
Potassium: 3.9 mmol/L (ref 3.5–5.1)
Sodium: 137 mmol/L (ref 135–145)
Total Bilirubin: 1.8 mg/dL — ABNORMAL HIGH (ref 0.3–1.2)
Total Protein: 4.9 g/dL — ABNORMAL LOW (ref 6.5–8.1)

## 2020-03-01 LAB — CBC
HCT: 31 % — ABNORMAL LOW (ref 39.0–52.0)
HCT: 28.9 % — ABNORMAL LOW (ref 39.0–52.0)
Hemoglobin: 10.5 g/dL — ABNORMAL LOW (ref 13.0–17.0)
Hemoglobin: 9.5 g/dL — ABNORMAL LOW (ref 13.0–17.0)
MCH: 34 pg (ref 26.0–34.0)
MCH: 33.1 pg (ref 26.0–34.0)
MCHC: 33.9 g/dL (ref 30.0–36.0)
MCHC: 32.9 g/dL (ref 30.0–36.0)
MCV: 100.3 fL — ABNORMAL HIGH (ref 80.0–100.0)
MCV: 100.7 fL — ABNORMAL HIGH (ref 80.0–100.0)
Platelets: 84 10*3/uL — ABNORMAL LOW (ref 150–400)
Platelets: 77 10*3/uL — ABNORMAL LOW (ref 150–400)
RBC: 3.09 MIL/uL — ABNORMAL LOW (ref 4.22–5.81)
RBC: 2.87 MIL/uL — ABNORMAL LOW (ref 4.22–5.81)
RDW: 14.9 % (ref 11.5–15.5)
RDW: 14.9 % (ref 11.5–15.5)
WBC: 4.3 10*3/uL (ref 4.0–10.5)
WBC: 4.3 10*3/uL (ref 4.0–10.5)
nRBC: 0 % (ref 0.0–0.2)
nRBC: 0 % (ref 0.0–0.2)

## 2020-03-01 LAB — PROCALCITONIN: Procalcitonin: <0.1 ng/mL

## 2020-03-01 LAB — HEPATITIS PANEL, ACUTE
HCV Ab: NONREACTIVE
Hep A IgM: NONREACTIVE
Hep B C IgM: NONREACTIVE
Hepatitis B Surface Ag: NONREACTIVE

## 2020-03-01 LAB — FIBRINOGEN: Fibrinogen: 186 mg/dL — ABNORMAL LOW (ref 210–475)

## 2020-03-01 LAB — PROTIME-INR
INR: 1.7 — ABNORMAL HIGH (ref 0.8–1.2)
Prothrombin Time: 19.4 seconds — ABNORMAL HIGH (ref 11.4–15.2)

## 2020-03-01 LAB — LACTATE DEHYDROGENASE: LDH: 215 U/L — ABNORMAL HIGH (ref 98–192)

## 2020-03-01 LAB — FIBRIN DERIVATIVES D-DIMER (ARMC ONLY): Fibrin derivatives D-dimer (ARMC): 1470.52 ng/mL (FEU) — ABNORMAL HIGH (ref 0.00–499.00)

## 2020-03-01 LAB — TSH: TSH: 1.245 u[IU]/mL (ref 0.350–4.500)

## 2020-03-01 LAB — HIV ANTIBODY (ROUTINE TESTING W REFLEX): HIV Screen 4th Generation wRfx: NONREACTIVE

## 2020-03-01 LAB — VITAMIN B12: Vitamin B-12: 2949 pg/mL — ABNORMAL HIGH (ref 180–914)

## 2020-03-01 LAB — C-REACTIVE PROTEIN: CRP: 0.5 mg/dL (ref <1.0)

## 2020-03-01 LAB — PHOSPHORUS: Phosphorus: 3.3 mg/dL (ref 2.5–4.6)

## 2020-03-01 LAB — PREALBUMIN: Prealbumin: <5 mg/dL — ABNORMAL LOW (ref 18–38)

## 2020-03-01 LAB — MAGNESIUM: Magnesium: 1.9 mg/dL (ref 1.7–2.4)

## 2020-03-01 MED ORDER — ALBUTEROL SULFATE HFA 108 (90 BASE) MCG/ACT IN AERS
2.0000 | INHALATION_SPRAY | Freq: Four times a day (QID) | RESPIRATORY_TRACT | Status: DC | PRN
  Filled 2020-03-01: qty 6.7

## 2020-03-01 MED ORDER — SERTRALINE HCL 50 MG PO TABS: 25.0000 mg | ORAL_TABLET | Freq: Every day | ORAL | Status: DC

## 2020-03-01 MED ORDER — ADULT MULTIVITAMIN W/MINERALS CH: 1.0000 | ORAL_TABLET | Freq: Every day | ORAL | Status: DC

## 2020-03-01 MED ORDER — SODIUM CHLORIDE 0.9 % IV SOLN
75.0000 mL/h | INTRAVENOUS | Status: AC
  Administered 2020-03-01: 75 mL/h via INTRAVENOUS

## 2020-03-01 MED ORDER — RIFAXIMIN 550 MG PO TABS
550.0000 mg | ORAL_TABLET | Freq: Two times a day (BID) | ORAL | Status: DC
  Administered 2020-03-01 (×2): 550 mg via ORAL
  Filled 2020-03-01 (×2): qty 1

## 2020-03-01 MED ORDER — ACETAMINOPHEN 650 MG RE SUPP: 650.0000 mg | Freq: Four times a day (QID) | RECTAL | Status: DC | PRN

## 2020-03-01 MED ORDER — BUSPIRONE HCL 15 MG PO TABS
15.0000 mg | ORAL_TABLET | Freq: Two times a day (BID) | ORAL | Status: DC
  Filled 2020-03-01 (×2): qty 1

## 2020-03-01 MED ORDER — SODIUM CHLORIDE 0.9 % IV SOLN
100.0000 mg | Freq: Every day | INTRAVENOUS | Status: DC
  Administered 2020-03-01: 10:00:00 100 mg via INTRAVENOUS
  Filled 2020-03-01 (×2): qty 20

## 2020-03-01 MED ORDER — SODIUM CHLORIDE 0.9 % IV SOLN
200.0000 mg | Freq: Once | INTRAVENOUS | Status: AC
  Administered 2020-03-01: 200 mg via INTRAVENOUS
  Filled 2020-03-01: qty 200

## 2020-03-01 MED ORDER — THIAMINE HCL 100 MG PO TABS
100.0000 mg | ORAL_TABLET | Freq: Every day | ORAL | Status: DC
  Administered 2020-03-01: 100 mg via ORAL
  Filled 2020-03-01: qty 1

## 2020-03-01 MED ORDER — HYDROCODONE-ACETAMINOPHEN 5-325 MG PO TABS: 1.0000 | ORAL_TABLET | ORAL | Status: DC | PRN

## 2020-03-01 MED ORDER — ACETAMINOPHEN 325 MG PO TABS: 650.0000 mg | ORAL_TABLET | Freq: Four times a day (QID) | ORAL | Status: DC | PRN

## 2020-03-01 MED ORDER — RIFAXIMIN 550 MG PO TABS: 550.0000 mg | ORAL_TABLET | Freq: Two times a day (BID) | ORAL | 3 refills | Status: DC

## 2020-03-01 NOTE — Discharge Summary (Signed)
Jeff Ward EVO:[id number] DOB: 04/06/1950 DOA: 02/29/2020  PCP: Adin Hector, MD  Admit date: 02/29/2020 Discharge date: 03/01/2020  Time spent: 45 minutes  Recommendations for Outpatient Follow-up:  1. Close f/u with GI (Dr. Fuller Plan)     Discharge Diagnoses:  Active Problems:   HLD (hyperlipidemia)   Essential hypertension, benign   OSA (obstructive sleep apnea)   GERD (gastroesophageal reflux disease)   Liver cirrhosis (HCC)   Alcoholic cirrhosis (HCC)   Esophageal varices without bleeding (HCC)   Hypokalemia   Hypotension   Prolonged QT interval   Symptomatic anemia   Occult blood in stools   Thrombocytopenia ([place])   Discharge Condition: fair  Diet recommendation: low sodium  Filed Weights   02/29/20 1951 03/01/20 1435  Weight: 81.6 kg 90.5 kg    History of present illness:  Jeff Ward is a 70 y.o. male with medical history significant of HTN, Anemia, GERD, HLD, asthma, CKD, depression, HTN, history of stroke, history of alcohol abuse History of alcohol liver disease w varices, MGUS, thrombocytopenia  Presented with   abnormal blood pressure reading.  Today patient took his blood pressure at home as he usually does and show to be 299B systolics he became concerned and took a dose of losartan he try to check it again but it was reading his error and became dizzy.  And EMS was called and on their arrival systolics was in 71I patient was given 1 L in the upon arrival to [facility] emergency department blood pressure was 90/47. Patient denies any chest pain shortness of breath  Patient has history of rapidly fluctuating blood pressures In the past have had a trial of amlodipine but was taken off secondary to edema they tried losartan and diuretics but patient became significantly dehydrated and hypotensive with creatinine going up to 3.5 at which point there was stopped. His primary care provider recommended using losartan only when his blood pressure goes up above  200  Regarding patient cirrhosis and esophageal disease he could not tolerate beta-blockers in the past secondary to bradycardia and hypotension supposed to follow-up with Dr. Fuller Plan  He is followed for his MGUS with hematology periodically.  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 male with medical history significant of HTN, Anemia, GERD, HLD, asthma, CKD, depression, HTN, history of stroke, history of alcohol abuse, History of alcohol liver disease w varices, MGUS, thrombocytopenia  Admitted for anemia, hypotension, in setting of labile BPs at home and hx of esophageal varices. Hgb has slowly downtrended for several months, was 11.9 in December, 9.5 last month, and here 7.5 on admission but spontaneously improved to 10.5 without blood transfusion. Hypotension likewise resolved spontaneously. No emesis or hematochezia or melena, endorses brown stools. Liver function appears stable. Seen by GI who advised no inpatient evaluation but close f/u with his gastroenterologist. Patient also incidental covid positive, asymptomatic, was started on remdesivir which will be discontinued at discharge. He will be referred for outpatient covid treatment. Of note although he denies ongoing alcohol use, his daughter says he continues to drink daily. Given ongoing alcohol use and cirrhosis i'm advising holding statin use at discharge.  Procedures:  none   Consultations:  GI  Discharge Exam: Vitals:   03/01/20 0749 03/01/20 1122  BP: 111/61 (!) 131/56  Pulse: (!) 54 64  Resp: 16 17  Temp: 98.3 F (36.8  C) 98.1 F (36.7 C)  SpO2: 100% 100%    General: chronically ill appearing, NAD Cardiovascular: rrr, no rubs/murmurs Respiratory: ctab Abdomen: obese, mildly distended, non-tender Extremities: no edema  Discharge Instructions   Discharge Instructions    Ambulatory referral  for Covid Treatment   Complete by: As directed    Date of Symptom Onset: 02/29/2020   Does the patient have any other medical conditions: Yes   Please list any other medical conditions: cirrhosis, obesity, htn, OSA   Has the patient received the COVID vaccine?: Yes   Call MD for:  difficulty breathing, headache or visual disturbances   Complete by: As directed    Call MD for:  persistant dizziness or light-headedness   Complete by: As directed    Call MD for:  persistant nausea and vomiting   Complete by: As directed    Call MD for:  severe uncontrolled pain   Complete by: As directed    Call MD for:  temperature >100.4   Complete by: As directed    Diet - low sodium heart healthy   Complete by: As directed    Increase activity slowly   Complete by: As directed      Allergies as of 03/01/2020      Reactions   Bee Venom Anaphylaxis   Duloxetine Nausea Only   Sulfa Antibiotics Other (See Comments)   Reaction:  Fainting       Medication List    STOP taking these medications   atorvastatin 40 MG tablet Commonly known as: LIPITOR     TAKE these medications   busPIRone 7.5 MG tablet Commonly known as: BUSPAR Take 2 tablets by mouth in the morning and at bedtime.   losartan 50 MG tablet Commonly known as: COZAAR Take 50 mg by mouth daily as needed.   pantoprazole 40 MG tablet Commonly known as: PROTONIX Take 1 tablet (40 mg total) by mouth daily.   rifaximin 550 MG Tabs tablet Commonly known as: XIFAXAN Take 1 tablet (550 mg total) by mouth 2 (two) times daily.   sertraline 25 MG tablet Commonly known as: ZOLOFT Take 25 mg by mouth daily.   tiZANidine 2 MG tablet Commonly known as: ZANAFLEX Take 2 mg by mouth 3 (three) times daily.   Ventolin HFA 108 (90 Base) MCG/ACT inhaler Generic drug: albuterol Inhale 2 puffs into the lungs every 6 (six) hours as needed for wheezing or shortness of breath.      Allergies  Allergen Reactions  . Bee Venom Anaphylaxis  .  Duloxetine Nausea Only  . Sulfa Antibiotics Other (See Comments)    Reaction:  Fainting     Follow-up Information    Adin Hector, MD Follow up.   Specialty: Internal Medicine Why: schedule hospital follow-up appointment within 1 week Contact information: [address] [place] [address] [phone]        Ladene Artist, MD. Schedule an appointment as soon as possible for a visit.   Specialty: Gastroenterology Contact information: [address] [place] [address] [phone]                The results of significant diagnostics from this hospitalization (including imaging, microbiology, ancillary and laboratory) are listed below for reference.    Significant Diagnostic Studies: No results found.  Microbiology: Recent Results (from the past 240 hour(s))  SARS Coronavirus 2 by RT PCR (hospital order, performed in [name] hospital lab) Nasopharyngeal Nasopharyngeal Swab  Status: Abnormal   Collection Time: 02/29/20 10:13 PM   Specimen: Nasopharyngeal Swab  Result Value Ref Range Status   SARS Coronavirus 2 POSITIVE (A) NEGATIVE Final    Comment: RESULT CALLED TO, READ BACK BY AND VERIFIED WITH: KENDALL SHEETS @2353  02/28/20 RH (NOTE) SARS-CoV-2 target nucleic acids are DETECTED  SARS-CoV-2 RNA is generally detectable in upper respiratory specimens  during the acute phase of infection.  Positive results are indicative  of the presence of the identified virus, but do not rule out bacterial infection or co-infection with other pathogens not detected by the test.  Clinical correlation with patient history and  other diagnostic information is necessary to determine patient infection status.  The expected result is negative.  Fact Sheet for Patients:   StrictlyIdeas.no   Fact Sheet for Healthcare Providers:   BankingDealers.co.za    This test is not yet approved or  cleared by the [place] FDA and  has been authorized for detection and/or diagnosis of SARS-CoV-2 by FDA under an Emergency Use Authorization (EUA).  This EUA will remain in effect (meaning this test c an be used) for the duration of  the COVID-19 declaration under Section 564(b)(1) of the Act, 21 U.S.C. section 360-bbb-3(b)(1), unless the authorization is terminated or revoked sooner.  Performed at [facility], [address] [place], [place] [address]      Labs: Basic Metabolic Panel: Recent Labs  Lab 02/29/20 1949 02/29/20 2213 03/01/20 0547  NA 137  --  137  K 3.1*  --  3.9  CL 111  --  111  CO2 19*  --  20*  GLUCOSE 112*  --  127*  BUN 10  --  10  CREATININE 1.17  --  1.00  CALCIUM 7.5*  --  7.5*  MG  --  1.7 1.9  PHOS  --  3.7 3.3   Liver Function Tests: Recent Labs  Lab 02/29/20 1949 03/01/20 0547  AST 54* 52*  ALT 31 31  ALKPHOS 87 86  BILITOT 1.8* 1.8*  PROT 4.7* 4.9*  ALBUMIN 2.3* 2.3*   No results for input(s): LIPASE, AMYLASE in the last 168 hours. Recent Labs  Lab 02/29/20 2213  AMMONIA 45*   CBC: Recent Labs  Lab 02/29/20 1949 02/29/20 2213 03/01/20 0547 03/01/20 1016  WBC 3.7* 2.6* 2.4* 4.3  NEUTROABS 1.9 1.6* 1.3*  --   HGB 7.5* 8.7* 8.8* 10.5*  HCT 22.7* 26.1* 25.5* 31.0*  MCV 101.3* 100.8* 99.6 100.3*  PLT 67* 63* 62* 84*   Cardiac Enzymes: No results for input(s): CKTOTAL, CKMB, CKMBINDEX, TROPONINI in the last 168 hours. BNP: BNP (last 3 results) No results for input(s): BNP in the last 8760 hours.  ProBNP (last 3 results) No results for input(s): PROBNP in the last 8760 hours.  CBG: No results for input(s): GLUCAP in the last 168 hours.     Signed:  Desma Maxim MD.  [facility] 03/01/2020, 3:24 PM

## 2020-03-01 NOTE — ED Notes (Signed)
Date and time results received: 02/29/20 2356  Test: Covid Critical Value: Positive  Name of Provider Notified: Doutova MD  See new orders placed

## 2020-03-01 NOTE — Plan of Care (Signed)
  Problem: Education: Goal: Knowledge of risk factors and measures for prevention of condition will improve Outcome: Progressing   Problem: Coping: Goal: Psychosocial and spiritual needs will be supported Outcome: Progressing   Problem: Respiratory: Goal: Will maintain a patent airway Outcome: Progressing Goal: Complications related to the disease process, condition or treatment will be avoided or minimized Outcome: Progressing   

## 2020-03-01 NOTE — Progress Notes (Signed)
Discharge paperwork reviewed with patient verbalizes understanding of d/c plan and follow - up care, including abstaining from all etoh use and discontinuing statin for now. All questions answered.

## 2020-03-01 NOTE — Discharge Instructions (Signed)
Because you are covid positive you should isolate for at least 10 days and contact your primary care provider for further guidance.  Because of your liver disease and alcohol use, I advise stopping your atorvastatin for the time being and discussing its ongoing use with your gastroenterologist and cardiologist.  I advise total abstinence from alcohol.

## 2020-03-01 NOTE — Consult Note (Signed)
GI Inpatient Consult Note  Reason for Consult: Anemia, Heme + stool   Attending Requesting Consult: Dr. Laurey Arrow  History of Present Illness: Jeff Ward is a 70 y.o. male seen for evaluation of anemia and heme positive stool at the request of Dr. Laurey Arrow. Pt has a PMH of decompensated cirrhosis, HTN, IDA, GERD, HLD, asthma, CKD, depression, Hx of stroke, and Hx of alcohol abuse. He presented to the [facility] ED last night for chief complaint of abnormal blood pressure reading. EMS was called and patient was found to have SBP in 50s and was given 1L bolus. Upon arrival to [facility] ED, he had BP 90/47. He had pancytopenia on labs with WBC 3.7, hemoglobin 7.5, and platelets 67K. He did not receive any blood products or transfusion. Repeat hemoglobin last night showed hemoglobin 8.7. 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 He had hepatic function panel showing AST 52, ALT 31, total bilirubin 1.8, and alk phos 86. Review of labs showed hemoglobin three weeks ago was 9.5, was 11.9 two months ago. He also tested positive for coronavirus last night. Per ED physician, patient had light brown stool on digital rectal exam, but was slightly guaiac positive. Review of notes shows patient follows with Dr. Lucio Edward in [place] for hx of decompensated alcoholic cirrhosis s/b ascites, esophageal varices, portal gastropathy, and hepatic encephalopathy. He is overdue for EGD due to history of varices s/p banding. Last EGD showed Grade II non-bleeding varices which were banded. He was supposed to have EGD last September, but this was pushed back due to Coronavirus pandemic.   Patient seen and examined this afternoon resting comfortably in hospital bed. He denies any upper respiratory symptoms such as fever, cough, sore throat, rhinorrhea, chills, or cold sweats. He has mild head congestion, but otherwise feels great. He has had two bowel movements since he has been here which were without any gross  hematochezia or melanotic stool. He reports BMs were brown in color. He denies any nausea, vomiting, or hematemesis. He denies any abdominal pain, cramping, fecal urgency, or fecal incontinence. Repeat hemoglobin this morning around 0600 was 8.8 and hemoglobin at 1000 this morning was 10.5. He feels well without any other acute complaints.    Last Colonoscopy: 07/19/2013 Last Endoscopy: 09/2018   Past Medical History:  Past Medical History:  Diagnosis Date  . Allergy    SEASONAL  . Anemia   . Anxiety   . Arthritis    In back and all joints  . Asthma   . Chronic headache    now resolved  . CKD (chronic kidney disease)   . 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 Depression   . Esophageal varices ([place])   . GERD (gastroesophageal reflux disease)   . GI bleed   . Hiatal hernia   . History of kidney stones 1970's  . Hyperlipidemia 06/2002  . Hypertension   . Liver disease   . Psoriasis   . Stroke ([place])    PT. STATED MILD  . Substance abuse ([place])    H/O ETOH    Problem List: Patient Active Problem List   Diagnosis Date Noted  . Hypokalemia 02/29/2020  . Hypotension 02/29/2020  . Prolonged QT interval 02/29/2020  . Symptomatic anemia 02/29/2020  . Occult blood in stools 02/29/2020  . Thrombocytopenia ([place]) 02/29/2020  . SI (sacroiliac) joint inflammation ([place]) 03/31/2016  . Encephalopathy, hepatic ([place]) 03/05/2016  . Esophageal varices without bleeding ([place]) 03/05/2016  .  Right foot pain 01/31/2016  . Hyperkalemia 01/31/2016  . Alcoholic cirrhosis of liver without ascites ([place]) 08/06/2015  . Secondary esophageal varices with bleeding ([place])   . Alcoholic cirrhosis ([place]) 07/20/7626  . Liver cirrhosis ([place]) 06/19/2015  . GERD (gastroesophageal reflux disease) 03/22/2015  . Personal history of colonic polyps 04/27/2013  . Depression 02/17/2013  . Hyperglycemia 07/23/2011  . OSA (obstructive sleep apnea) 04/10/2011  . Testosterone deficiency 03/06/2011   . Degenerative disc disease, lumbar 05/09/2010  . OTHER NONSPECIFIC ABNORMAL SERUM ENZYME LEVELS 05/23/2009  . Essential hypertension, benign 09/01/2007  . COLONIC POLYPS 10/23/2006  . HLD (hyperlipidemia) 10/23/2006  . PUD 10/23/2006  . 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 CARPAL TUNNEL SYNDROME, BILATERAL, HX OF 10/23/2006  . BENIGN PROSTATIC HYPERTROPHY, HX OF 10/23/2006    Past Surgical History: Past Surgical History:  Procedure Laterality Date  . CARPAL TUNNEL RELEASE  2005    / right hand  . COLONOSCOPY     march 2016  . ESOPHAGEAL BANDING  09/22/2017   Procedure: ESOPHAGEAL BANDING;  Surgeon: Ladene Artist, MD;  Location: [facility];  Service: Endoscopy;;  . ESOPHAGEAL BANDING  10/11/2018   Procedure: ESOPHAGEAL BANDING;  Surgeon: Ladene Artist, MD;  Location: [facility] ENDOSCOPY;  Service: Endoscopy;;  . ESOPHAGOGASTRODUODENOSCOPY N/A 07/24/2015   Procedure: ESOPHAGOGASTRODUODENOSCOPY (EGD);  Surgeon: Lucilla Lame, MD;  Location: [facility] ENDOSCOPY;  Service: Endoscopy;  Laterality: N/A;  Banding  . ESOPHAGOGASTRODUODENOSCOPY (EGD) WITH PROPOFOL N/A 08/14/2015   Procedure: ESOPHAGOGASTRODUODENOSCOPY (EGD) WITH PROPOFOL;  Surgeon: Ladene Artist, MD;  Location: [facility] ENDOSCOPY;  Service: Endoscopy;  Laterality: N/A;  . ESOPHAGOGASTRODUODENOSCOPY (EGD) WITH PROPOFOL N/A 09/22/2017   Procedure: ESOPHAGOGASTRODUODENOSCOPY (EGD) WITH PROPOFOL;  Surgeon: Ladene Artist, MD;  Location: [facility] ENDOSCOPY;  Service: Endoscopy;  Laterality: N/A;  . ESOPHAGOGASTRODUODENOSCOPY (EGD) WITH PROPOFOL N/A 10/11/2018   Procedure: ESOPHAGOGASTRODUODENOSCOPY (EGD) WITH PROPOFOL;  Surgeon: Ladene Artist, MD;  Location: [facility] ENDOSCOPY;  Service: Endoscopy;  Laterality: N/A;  . TONSILLECTOMY AND ADENOIDECTOMY     70 years old  . VASECTOMY      Allergies: Allergies  Allergen Reactions  . Bee Venom Anaphylaxis  . Duloxetine Nausea Only  . Sulfa Antibiotics Other (See Comments)    Reaction:  Fainting      Home Medications: Medications Prior to Admission  Medication Sig Dispense Refill Last Dose  . atorvastatin (LIPITOR) 40 MG tablet Take 40 mg by mouth daily.   Past Week at Unknown time  . busPIRone (BUSPAR) 7.5 MG tablet Take 2 tablets by mouth in the morning and at bedtime.   Past Week at Unknown time  . losartan (COZAAR) 50 MG tablet Take 50 mg by mouth daily as needed.   prn at prn  . pantoprazole (PROTONIX) 40 MG tablet Take 1 tablet (40 mg total) by mouth daily. 90 tablet 2 Past Week at Unknown time  . sertraline (ZOLOFT) 25 MG tablet Take 25 mg by mouth daily.   Past Week at Unknown time  . tiZANidine (ZANAFLEX) 2 MG tablet Take 2 mg by mouth 3 (three) times daily.   Past Week at Unknown time  . VENTOLIN HFA 108 (90 Base) MCG/ACT inhaler Inhale 2 puffs into the lungs every 6 (six) hours as needed for wheezing or shortness of breath.    prn at prn   Home medication reconciliation was completed with the patient.   Scheduled Inpatient Medications:   . sodium chloride   Intravenous Once  . rifaximin  550 mg Oral BID  .  thiamine  100 mg Oral Daily    Continuous Inpatient Infusions:   . sodium chloride    . pantoprozole (PROTONIX) infusion 8 mg/hr (03/01/20 1116)  . remdesivir 100 mg in NS 100 mL Stopped (03/01/20 1303)    PRN Inpatient Medications:  acetaminophen **OR** acetaminophen, albuterol, HYDROcodone-acetaminophen  Family History: family history includes Alcohol abuse in his maternal grandfather; Breast cancer in his paternal aunt; CAD in his mother; CVA in his father and mother; Heart disease in his mother; Liver disease in his brother; Melanoma in his father.  The patient's family history is negative for inflammatory bowel disorders, GI malignancy, or solid organ transplantation.  Social History:   reports that he quit smoking about 9 years ago. His smoking use included cigars. He quit after 35.00 years of use. He has never used smokeless tobacco. He reports that he  does not drink alcohol and does not use drugs. The patient denies ETOH, tobacco, or drug use.   Review of Systems: Constitutional: Weight is stable.  Eyes: No changes in vision. ENT: No oral lesions, sore throat.  GI: see HPI.  Heme/Lymph: No easy bruising.  CV: No chest pain.  GU: No hematuria.  Integumentary: No rashes.  Neuro: No headaches.  Psych: No depression/anxiety.  Endocrine: No heat/cold intolerance.  Allergic/Immunologic: No urticaria.  Resp: No cough, SOB.  Musculoskeletal: No joint swelling.    Physical Examination: BP (!) 131/56 (BP Location: Left Arm)   Pulse 64   Temp 98.1 F (36.7 C) (Oral)   Resp 17   Ht 5' 5" (1.651 m)   Wt 81.6 kg   SpO2 100%   BMI 29.95 kg/m  Gen: NAD, alert and oriented x 4 HEENT: PEERLA, EOMI, Neck: supple, no JVD or thyromegaly Chest: CTA bilaterally, no wheezes, crackles, or other adventitious sounds CV: RRR, no m/g/c/r Abd: soft, NT, ND, +BS in all four quadrants; no HSM, guarding, ridigity, or rebound tenderness Ext: no edema, well perfused with 2+ pulses, Skin: no rash or lesions noted Lymph: no LAD  Data: Lab Results  Component Value Date   WBC 4.3 03/01/2020   HGB 10.5 (L) 03/01/2020   HCT 31.0 (L) 03/01/2020   MCV 100.3 (H) 03/01/2020   PLT 84 (L) 03/01/2020   Recent Labs  Lab 02/29/20 2213 03/01/20 0547 03/01/20 1016  HGB 8.7* 8.8* 10.5*   Lab Results  Component Value Date   NA 137 03/01/2020   K 3.9 03/01/2020   CL 111 03/01/2020   CO2 20 (L) 03/01/2020   BUN 10 03/01/2020   CREATININE 1.00 03/01/2020   Lab Results  Component Value Date   ALT 31 03/01/2020   AST 52 (H) 03/01/2020   ALKPHOS 86 03/01/2020   BILITOT 1.8 (H) 03/01/2020   Recent Labs  Lab 03/01/20 0547  INR 1.7*   Assessment/Plan:  70 y/o Caucasian male with a PMH of HTN, HLD, asthma,  decompensated cirrhosis, IDA, GERD, CKD,depression, Hx of stroke, and Hx of alcohol abuse admitted for fluctuating blood pressures, hypotension.  GI consulted for anemia and heme positive stool.  1. Anemia/Heme positive stool - there was drop in hemoglobin from 9.5 three weeks ago to 7.5 last night. He did not receive any blood transfusions and hemoglobin has increased to 10.5 this morning. No gross gastrointestinal blood loss. He is having regular BMs which are without any gross hematochezia or melanotic stool. No concern for GI bleed at this time.   2. Decompensated alcoholic cirrhosis c/b esophageal varices s/p banding 09/2018,  hepatic encephalopathy, ascites, and portal gastropathy - MELD 15, Child Pugh Class B (9 pts), stable currently with no signs of overt hepatic decompensation event   3. COVID-19 Positive - asymptomatic, he is vaccinated, but hasn't received booster dose  4. Thrombocytopenia - stable, 84K today. No overt bleeding.  5. Hypotension - resolved at present. Due to history of decompensated cirrhosis, there is to be expected physiologic hypotension  Recommendations:  - Continue to monitor H&H closely. Transfuse as appropriate for hemoglobin <7.0. To this point not requiring transfusion. Pt is hemodynamically stable  - No overt gastrointestinal bleeding - No plans for inpatient GI procedures at present time given no overt bleeding or significant hemodynamic changes - Patient should follow-up with his outpatient gastroenterologist - Dr. Fuller Plan - for discussion on setting up EGD/colonoscopy for variceal surveillance and polyp surveillance, respectively as he is overdue - Continue home medications - GI will sign off at this time. Anticipate discharge possibly today with outpatient follow-up with his GI doctor   Thank you for the consult. Please call with questions or concerns.   Reeves Forth [facility] [phone] (Cell)

## 2020-03-01 NOTE — Progress Notes (Signed)
Initial Nutrition Assessment  DOCUMENTATION CODES:   Obesity unspecified  INTERVENTION:  Once diet is advanced provide Ensure Enlive po BID, each supplement provides 350 kcal and 20 grams of protein.  Provide MVI po daily.  NUTRITION DIAGNOSIS:   Increased nutrient needs related to catabolic illness (cirrhosis, COVID-19) as evidenced by estimated needs.  GOAL:   Patient will meet greater than or equal to 90% of their needs  MONITOR:   PO intake,Supplement acceptance,Diet advancement,Labs,Weight trends,I & O's  REASON FOR ASSESSMENT:   Consult Assessment of nutrition requirement/status  ASSESSMENT:   70 year old male with PMHx of HTN, HLD, arthritis, depression, GERD, hiatal hernia, esophageal varices, CKD, cirrhosis, anxiety, asthma admitted with hypotension, symptomatic anemia, also found to have COVID-19.   Met with patient at bedside. He reports he has a good appetite and intake. He typically eats 2 meals per day. For breakfast he may have bagel and fruit. Second meal of the day may be chicken or meat with vegetables/sides. He reports he has been having more vegetables and vegetable juices lately. Discussed importance of adequate calorie and protein intake in setting of cirrhosis and then also acutely in setting of COVID-19. Patient is amenable to trying oral nutrition supplements to help meet calorie/protein needs. Patient is currently on clear liquid diet. He reports he is tolerating it well and eating 100% of trays. Plan was to hold on diet advancement pending GI evaluation but per patient plan is for no GI interventions at this time.  Patient reports he has had weight loss over the past 3 months, but it was intentional due to increase in vegetable and vegetable juice intake. He reports his UBW was around 206 lbs (93.6 kg). RD obtained bed scale weigh to 90.5 kg today. That is a weight loss of 3.1 kg (3.3% body weight) over 3 months, which is not significant for time frame.    Medications reviewed and include: thiamine 100 mg daily, Protonix, remdesivir.  Labs reviewed: CO2 20.  Patient does not meet criteria for malnutrition at this time.  NUTRITION - FOCUSED PHYSICAL EXAM:  Flowsheet Row Most Recent Value  Orbital Region No depletion  Upper Arm Region Mild depletion  Thoracic and Lumbar Region No depletion  Buccal Region No depletion  Temple Region No depletion  Clavicle Bone Region No depletion  Clavicle and Acromion Bone Region No depletion  Scapular Bone Region No depletion  Dorsal Hand No depletion  Patellar Region No depletion  Anterior Thigh Region No depletion  Posterior Calf Region No depletion  Edema (RD Assessment) None  Hair Reviewed  Eyes Reviewed  Mouth Reviewed  Skin Reviewed  Nails Reviewed     Diet Order:   Diet Order            Diet clear liquid Room service appropriate? Yes; Fluid consistency: Thin  Diet effective now                EDUCATION NEEDS:   No education needs have been identified at this time  Skin:  Skin Assessment: Reviewed RN Assessment  Last BM:  Unknown  Height:   Ht Readings from Last 1 Encounters:  02/29/20 5' 5" (1.651 m)   Weight:   Wt Readings from Last 1 Encounters:  03/01/20 90.5 kg   BMI:  Body mass index is 33.2 kg/m.  Estimated Nutritional Needs:   Kcal:  2100-2300  Protein:  105-115 grams  Fluid:  >/= 2 L/day  Jacklynn Barnacle, MS, RD, LDN Pager number available  on Amion 

## 2020-03-02 ENCOUNTER — Telehealth: Payer: Self-pay | Admitting: Physician Assistant

## 2020-03-02 LAB — TYPE AND SCREEN
ABO/RH(D): A POS
Antibody Screen: NEGATIVE
Unit division: 0
Unit division: 0

## 2020-03-02 LAB — PREPARE RBC (CROSSMATCH)

## 2020-03-02 LAB — BPAM RBC
Blood Product Expiration Date: 202203062359
Blood Product Expiration Date: 202203062359
Unit Type and Rh: 6200
Unit Type and Rh: 6200

## 2020-03-02 NOTE — Telephone Encounter (Signed)
Called to discuss with Cage A Osberg about Covid symptoms and the use of sotrovimab, remdisivir or oral therapies for those with mild to moderate Covid symptoms and at a high risk of hospitalization.     Pt does not qualify as pt's symptoms first presented > 10 days prior to timing of infusion. Symptoms tier reviewed as well as criteria for ending isolation. Preventative practices reviewed. Patient verbalized understanding   Patient Active Problem List   Diagnosis Date Noted  . Hypokalemia 02/29/2020  . Hypotension 02/29/2020  . Prolonged QT interval 02/29/2020  . Symptomatic anemia 02/29/2020  . Occult blood in stools 02/29/2020  . Thrombocytopenia ([place]) 02/29/2020  . SI (sacroiliac) joint inflammation ([place]) 03/31/2016  . Encephalopathy, hepatic ([place]) 03/05/2016  . Esophageal varices without bleeding ([place]) 03/05/2016  . Right foot pain 01/31/2016  . Hyperkalemia 01/31/2016  . Alcoholic cirrhosis of liver without ascites ([place]) 08/06/2015  . Secondary esophageal varices with bleeding ([place])   . Alcoholic cirrhosis ([place]) 82/50/5397  . Liver cirrhosis ([place]) 06/19/2015  . GERD (gastroesophageal reflux disease) 03/22/2015  . Personal history of colonic polyps 04/27/2013  . Depression 02/17/2013  . Hyperglycemia 07/23/2011  . OSA (obstructive sleep apnea) 04/10/2011  . Testosterone deficiency 03/06/2011  . Degenerative disc disease, lumbar 05/09/2010  . OTHER NONSPECIFIC ABNORMAL SERUM ENZYME LEVELS 05/23/2009  . Essential hypertension, benign 09/01/2007  . COLONIC POLYPS 10/23/2006  . HLD (hyperlipidemia) 10/23/2006  . PUD 10/23/2006  . 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 CARPAL TUNNEL SYNDROME, BILATERAL, HX OF 10/23/2006  . BENIGN PROSTATIC HYPERTROPHY, HX OF 10/23/2006    Angelena Form PA-C

## 2020-03-06 ENCOUNTER — Telehealth: Payer: Self-pay | Admitting: Gastroenterology

## 2020-03-06 NOTE — Telephone Encounter (Signed)
Pt is requesting a call back to obtain a refill on his Xifaxan.  Pharmacy: [facility]

## 2020-03-06 NOTE — Telephone Encounter (Signed)
Lactulose 20 mL po bid

## 2020-03-06 NOTE — Telephone Encounter (Signed)
Patient states his patient assistance for Xifaxan has been denied. [place] called patient to inform him that it was denied due to him having insurance. Patient states even with insurance it is over $1300. Patient would like to know if he can take an alternative to Xifaxan. Patient has 23 pills left. Please advise Dr. Fuller Plan.

## 2020-03-07 DIAGNOSIS — R001 Bradycardia, unspecified: Secondary | ICD-10-CM | POA: Diagnosis not present

## 2020-03-07 MED ORDER — LACTULOSE 20 GM/30ML PO SOLN: 20.0000 mL | Freq: Two times a day (BID) | ORAL | 5 refills | Status: AC

## 2020-03-07 NOTE — Telephone Encounter (Signed)
Prescription sent to patient's pharmacy and patient notified. Patient verbalized understanding. Pt currently has covid and will get another person to pick up his prescriptions.

## 2020-03-12 DIAGNOSIS — R600 Localized edema: Secondary | ICD-10-CM | POA: Diagnosis not present

## 2020-03-12 DIAGNOSIS — R69 Illness, unspecified: Secondary | ICD-10-CM | POA: Diagnosis not present

## 2020-03-12 DIAGNOSIS — I10 Essential (primary) hypertension: Secondary | ICD-10-CM | POA: Diagnosis not present

## 2020-03-20 DIAGNOSIS — K7030 Alcoholic cirrhosis of liver without ascites: Secondary | ICD-10-CM | POA: Diagnosis not present

## 2020-03-20 DIAGNOSIS — R69 Illness, unspecified: Secondary | ICD-10-CM | POA: Diagnosis not present

## 2020-03-20 DIAGNOSIS — R441 Visual hallucinations: Secondary | ICD-10-CM | POA: Diagnosis not present

## 2020-03-20 DIAGNOSIS — R600 Localized edema: Secondary | ICD-10-CM | POA: Diagnosis not present

## 2020-03-20 DIAGNOSIS — I10 Essential (primary) hypertension: Secondary | ICD-10-CM | POA: Diagnosis not present

## 2020-03-20 DIAGNOSIS — R5383 Other fatigue: Secondary | ICD-10-CM | POA: Diagnosis not present

## 2020-03-23 ENCOUNTER — Telehealth: Payer: Self-pay | Admitting: Gastroenterology

## 2020-03-23 NOTE — Telephone Encounter (Signed)
Patient advised that he can try hydrocortisone OTC. He reports that his hemorrhoids are bleeding occasionally. He has tried to Prep H with little relief.  He will try the hydrocortisone and will try back if this fails to improve his symptoms.

## 2020-03-23 NOTE — Telephone Encounter (Signed)
Left message for patient not caller,  to call back

## 2020-03-23 NOTE — Telephone Encounter (Signed)
Unfortunately, Lori Martinique is not on patient's DPR so I cannot speak to her regarding patient's healthcare. Also, a phone number was not given for me to call back.

## 2020-03-26 ENCOUNTER — Other Ambulatory Visit: Payer: Self-pay

## 2020-03-26 DIAGNOSIS — K7031 Alcoholic cirrhosis of liver with ascites: Secondary | ICD-10-CM

## 2020-03-27 DIAGNOSIS — I10 Essential (primary) hypertension: Secondary | ICD-10-CM | POA: Diagnosis not present

## 2020-03-29 DIAGNOSIS — R0602 Shortness of breath: Secondary | ICD-10-CM | POA: Diagnosis not present

## 2020-03-29 DIAGNOSIS — R600 Localized edema: Secondary | ICD-10-CM | POA: Diagnosis not present

## 2020-03-29 DIAGNOSIS — N183 Chronic kidney disease, stage 3 unspecified: Secondary | ICD-10-CM | POA: Diagnosis not present

## 2020-03-29 DIAGNOSIS — E7849 Other hyperlipidemia: Secondary | ICD-10-CM | POA: Diagnosis not present

## 2020-03-29 DIAGNOSIS — R69 Illness, unspecified: Secondary | ICD-10-CM | POA: Diagnosis not present

## 2020-03-29 DIAGNOSIS — I878 Other specified disorders of veins: Secondary | ICD-10-CM | POA: Diagnosis not present

## 2020-03-29 DIAGNOSIS — I8510 Secondary esophageal varices without bleeding: Secondary | ICD-10-CM | POA: Diagnosis not present

## 2020-03-29 DIAGNOSIS — D696 Thrombocytopenia, unspecified: Secondary | ICD-10-CM | POA: Diagnosis not present

## 2020-03-29 DIAGNOSIS — R0989 Other specified symptoms and signs involving the circulatory and respiratory systems: Secondary | ICD-10-CM | POA: Diagnosis not present

## 2020-03-29 DIAGNOSIS — R739 Hyperglycemia, unspecified: Secondary | ICD-10-CM | POA: Diagnosis not present

## 2020-03-29 DIAGNOSIS — I129 Hypertensive chronic kidney disease with stage 1 through stage 4 chronic kidney disease, or unspecified chronic kidney disease: Secondary | ICD-10-CM | POA: Diagnosis not present

## 2020-04-01 ENCOUNTER — Emergency Department: Payer: Medicare HMO

## 2020-04-01 ENCOUNTER — Other Ambulatory Visit: Payer: Self-pay

## 2020-04-01 ENCOUNTER — Encounter: Payer: Self-pay | Admitting: Emergency Medicine

## 2020-04-01 ENCOUNTER — Encounter: Payer: Self-pay | Admitting: Internal Medicine

## 2020-04-01 ENCOUNTER — Inpatient Hospital Stay
Admission: EM | Admit: 2020-04-01 | Discharge: 2020-04-06 | DRG: 432 | Disposition: A | Discharge status: Home / Self Care | Payer: Medicare HMO | Attending: Internal Medicine | Admitting: Internal Medicine

## 2020-04-01 DIAGNOSIS — K7031 Alcoholic cirrhosis of liver with ascites: Secondary | ICD-10-CM | POA: Diagnosis not present

## 2020-04-01 DIAGNOSIS — J9811 Atelectasis: Secondary | ICD-10-CM | POA: Diagnosis present

## 2020-04-01 DIAGNOSIS — R778 Other specified abnormalities of plasma proteins: Secondary | ICD-10-CM | POA: Diagnosis present

## 2020-04-01 DIAGNOSIS — Z7189 Other specified counseling: Secondary | ICD-10-CM | POA: Diagnosis not present

## 2020-04-01 DIAGNOSIS — K766 Portal hypertension: Secondary | ICD-10-CM | POA: Diagnosis not present

## 2020-04-01 DIAGNOSIS — R188 Other ascites: Secondary | ICD-10-CM

## 2020-04-01 DIAGNOSIS — Z888 Allergy status to other drugs, medicaments and biological substances status: Secondary | ICD-10-CM

## 2020-04-01 DIAGNOSIS — R0602 Shortness of breath: Secondary | ICD-10-CM | POA: Diagnosis not present

## 2020-04-01 DIAGNOSIS — I81 Portal vein thrombosis: Secondary | ICD-10-CM | POA: Diagnosis not present

## 2020-04-01 DIAGNOSIS — E8770 Fluid overload, unspecified: Secondary | ICD-10-CM | POA: Diagnosis present

## 2020-04-01 DIAGNOSIS — I517 Cardiomegaly: Secondary | ICD-10-CM | POA: Diagnosis not present

## 2020-04-01 DIAGNOSIS — K7040 Alcoholic hepatic failure without coma: Secondary | ICD-10-CM | POA: Diagnosis not present

## 2020-04-01 DIAGNOSIS — K648 Other hemorrhoids: Secondary | ICD-10-CM | POA: Diagnosis present

## 2020-04-01 DIAGNOSIS — Z87891 Personal history of nicotine dependence: Secondary | ICD-10-CM | POA: Diagnosis not present

## 2020-04-01 DIAGNOSIS — I214 Non-ST elevation (NSTEMI) myocardial infarction: Secondary | ICD-10-CM | POA: Diagnosis present

## 2020-04-01 DIAGNOSIS — E785 Hyperlipidemia, unspecified: Secondary | ICD-10-CM | POA: Diagnosis not present

## 2020-04-01 DIAGNOSIS — K3189 Other diseases of stomach and duodenum: Secondary | ICD-10-CM | POA: Diagnosis not present

## 2020-04-01 DIAGNOSIS — M7989 Other specified soft tissue disorders: Secondary | ICD-10-CM | POA: Diagnosis present

## 2020-04-01 DIAGNOSIS — J90 Pleural effusion, not elsewhere classified: Secondary | ICD-10-CM | POA: Diagnosis not present

## 2020-04-01 DIAGNOSIS — D500 Iron deficiency anemia secondary to blood loss (chronic): Secondary | ICD-10-CM | POA: Diagnosis present

## 2020-04-01 DIAGNOSIS — K5900 Constipation, unspecified: Secondary | ICD-10-CM | POA: Diagnosis present

## 2020-04-01 DIAGNOSIS — F32A Depression, unspecified: Secondary | ICD-10-CM | POA: Diagnosis present

## 2020-04-01 DIAGNOSIS — Z01810 Encounter for preprocedural cardiovascular examination: Secondary | ICD-10-CM | POA: Diagnosis not present

## 2020-04-01 DIAGNOSIS — I10 Essential (primary) hypertension: Secondary | ICD-10-CM | POA: Diagnosis present

## 2020-04-01 DIAGNOSIS — N281 Cyst of kidney, acquired: Secondary | ICD-10-CM | POA: Diagnosis not present

## 2020-04-01 DIAGNOSIS — K644 Residual hemorrhoidal skin tags: Secondary | ICD-10-CM | POA: Diagnosis present

## 2020-04-01 DIAGNOSIS — K922 Gastrointestinal hemorrhage, unspecified: Secondary | ICD-10-CM

## 2020-04-01 DIAGNOSIS — Z882 Allergy status to sulfonamides status: Secondary | ICD-10-CM | POA: Diagnosis not present

## 2020-04-01 DIAGNOSIS — Z8719 Personal history of other diseases of the digestive system: Secondary | ICD-10-CM | POA: Diagnosis not present

## 2020-04-01 DIAGNOSIS — Z515 Encounter for palliative care: Secondary | ICD-10-CM | POA: Diagnosis not present

## 2020-04-01 DIAGNOSIS — K625 Hemorrhage of anus and rectum: Secondary | ICD-10-CM | POA: Diagnosis present

## 2020-04-01 DIAGNOSIS — Z823 Family history of stroke: Secondary | ICD-10-CM | POA: Diagnosis not present

## 2020-04-01 DIAGNOSIS — I8510 Secondary esophageal varices without bleeding: Secondary | ICD-10-CM | POA: Diagnosis not present

## 2020-04-01 DIAGNOSIS — K7469 Other cirrhosis of liver: Secondary | ICD-10-CM | POA: Diagnosis not present

## 2020-04-01 DIAGNOSIS — Z808 Family history of malignant neoplasm of other organs or systems: Secondary | ICD-10-CM

## 2020-04-01 DIAGNOSIS — Z803 Family history of malignant neoplasm of breast: Secondary | ICD-10-CM

## 2020-04-01 DIAGNOSIS — R69 Illness, unspecified: Secondary | ICD-10-CM | POA: Diagnosis not present

## 2020-04-01 DIAGNOSIS — R748 Abnormal levels of other serum enzymes: Secondary | ICD-10-CM | POA: Diagnosis not present

## 2020-04-01 DIAGNOSIS — Z79899 Other long term (current) drug therapy: Secondary | ICD-10-CM | POA: Diagnosis not present

## 2020-04-01 DIAGNOSIS — K7460 Unspecified cirrhosis of liver: Secondary | ICD-10-CM | POA: Diagnosis not present

## 2020-04-01 DIAGNOSIS — Z8249 Family history of ischemic heart disease and other diseases of the circulatory system: Secondary | ICD-10-CM

## 2020-04-01 DIAGNOSIS — K219 Gastro-esophageal reflux disease without esophagitis: Secondary | ICD-10-CM | POA: Diagnosis present

## 2020-04-01 LAB — CBC
HCT: 29.9 % — ABNORMAL LOW (ref 39.0–52.0)
Hemoglobin: 10.3 g/dL — ABNORMAL LOW (ref 13.0–17.0)
MCH: 32.8 pg (ref 26.0–34.0)
MCHC: 34.4 g/dL (ref 30.0–36.0)
MCV: 95.2 fL (ref 80.0–100.0)
Platelets: 141 10*3/uL — ABNORMAL LOW (ref 150–400)
RBC: 3.14 MIL/uL — ABNORMAL LOW (ref 4.22–5.81)
RDW: 15.1 % (ref 11.5–15.5)
WBC: 7.7 10*3/uL (ref 4.0–10.5)
nRBC: 0 % (ref 0.0–0.2)

## 2020-04-01 LAB — BASIC METABOLIC PANEL
Anion gap: 11 (ref 5–15)
BUN: 14 mg/dL (ref 8–23)
CO2: 23 mmol/L (ref 22–32)
Calcium: 9 mg/dL (ref 8.9–10.3)
Chloride: 106 mmol/L (ref 98–111)
Creatinine, Ser: 0.94 mg/dL (ref 0.61–1.24)
GFR, Estimated: >60 mL/min (ref >60)
Glucose, Bld: 124 mg/dL — ABNORMAL HIGH (ref 70–99)
Potassium: 3.8 mmol/L (ref 3.5–5.1)
Sodium: 140 mmol/L (ref 135–145)

## 2020-04-01 LAB — TROPONIN I (HIGH SENSITIVITY)
Troponin I (High Sensitivity): 1335 ng/L (ref <18)
Troponin I (High Sensitivity): 1153 ng/L (ref <18)
Troponin I (High Sensitivity): 1009 ng/L (ref <18)
Troponin I (High Sensitivity): 791 ng/L (ref <18)

## 2020-04-01 MED ORDER — SODIUM CHLORIDE 0.9 % IV SOLN
8.0000 mg/h | INTRAVENOUS | Status: DC
  Administered 2020-04-01 – 2020-04-02 (×2): 8 mg/h via INTRAVENOUS
  Filled 2020-04-01 (×2): qty 80

## 2020-04-01 MED ORDER — ATORVASTATIN CALCIUM 20 MG PO TABS
20.0000 mg | ORAL_TABLET | Freq: Every day | ORAL | Status: DC
  Administered 2020-04-02 – 2020-04-06 (×5): 20 mg via ORAL
  Filled 2020-04-01 (×5): qty 1

## 2020-04-01 MED ORDER — ASPIRIN 300 MG RE SUPP: 300.0000 mg | RECTAL | Status: DC

## 2020-04-01 MED ORDER — SODIUM CHLORIDE 0.9 % IV SOLN
80.0000 mg | Freq: Once | INTRAVENOUS | Status: AC
  Administered 2020-04-01: 80 mg via INTRAVENOUS
  Filled 2020-04-01: qty 80

## 2020-04-01 MED ORDER — ALBUTEROL SULFATE HFA 108 (90 BASE) MCG/ACT IN AERS
2.0000 | INHALATION_SPRAY | Freq: Four times a day (QID) | RESPIRATORY_TRACT | Status: DC | PRN
  Filled 2020-04-01: qty 6.7

## 2020-04-01 MED ORDER — RIFAXIMIN 550 MG PO TABS
550.0000 mg | ORAL_TABLET | Freq: Two times a day (BID) | ORAL | Status: DC
  Administered 2020-04-01 – 2020-04-06 (×10): 550 mg via ORAL
  Filled 2020-04-01 (×14): qty 1

## 2020-04-01 MED ORDER — METOPROLOL TARTRATE 25 MG PO TABS
12.5000 mg | ORAL_TABLET | Freq: Two times a day (BID) | ORAL | Status: DC
Start: 2020-04-01 — End: 2020-04-01

## 2020-04-01 MED ORDER — SODIUM CHLORIDE 0.9% FLUSH
3.0000 mL | Freq: Two times a day (BID) | INTRAVENOUS | Status: DC
  Administered 2020-04-01 – 2020-04-06 (×6): 3 mL via INTRAVENOUS

## 2020-04-01 MED ORDER — SERTRALINE HCL 50 MG PO TABS
25.0000 mg | ORAL_TABLET | Freq: Every day | ORAL | Status: DC
  Administered 2020-04-02 – 2020-04-06 (×5): 25 mg via ORAL
  Filled 2020-04-01 (×5): qty 1

## 2020-04-01 MED ORDER — ASPIRIN EC 81 MG PO TBEC
81.0000 mg | DELAYED_RELEASE_TABLET | Freq: Every day | ORAL | Status: DC
  Administered 2020-04-02 – 2020-04-06 (×5): 81 mg via ORAL
  Filled 2020-04-01 (×5): qty 1

## 2020-04-01 MED ORDER — PANTOPRAZOLE SODIUM 40 MG IV SOLR: 40.0000 mg | Freq: Two times a day (BID) | INTRAVENOUS | Status: DC

## 2020-04-01 MED ORDER — ASPIRIN 81 MG PO CHEW
324.0000 mg | CHEWABLE_TABLET | Freq: Once | ORAL | Status: AC
  Administered 2020-04-01: 324 mg via ORAL
  Filled 2020-04-01: qty 4

## 2020-04-01 MED ORDER — NITROGLYCERIN 0.4 MG SL SUBL: 0.4000 mg | SUBLINGUAL_TABLET | SUBLINGUAL | Status: DC | PRN

## 2020-04-01 MED ORDER — BUSPIRONE HCL 15 MG PO TABS
7.5000 mg | ORAL_TABLET | Freq: Two times a day (BID) | ORAL | Status: DC
  Administered 2020-04-01 – 2020-04-06 (×10): 7.5 mg via ORAL
  Filled 2020-04-01 (×13): qty 1

## 2020-04-01 MED ORDER — LACTULOSE 10 GM/15ML PO SOLN
13.3333 g | Freq: Two times a day (BID) | ORAL | Status: DC
  Administered 2020-04-01 – 2020-04-06 (×10): 13.3333 g via ORAL
  Filled 2020-04-01 (×11): qty 30

## 2020-04-01 MED ORDER — PANTOPRAZOLE SODIUM 40 MG PO TBEC: 40.0000 mg | DELAYED_RELEASE_TABLET | Freq: Every day | ORAL | Status: DC

## 2020-04-01 MED ORDER — ACETAMINOPHEN 325 MG PO TABS
650.0000 mg | ORAL_TABLET | ORAL | Status: DC | PRN
  Administered 2020-04-02 – 2020-04-05 (×2): 650 mg via ORAL
  Filled 2020-04-01 (×2): qty 2

## 2020-04-01 MED ORDER — LOSARTAN POTASSIUM 50 MG PO TABS: 50.0000 mg | ORAL_TABLET | Freq: Every day | ORAL | Status: DC

## 2020-04-01 MED ORDER — IOHEXOL 350 MG/ML SOLN
75.0000 mL | Freq: Once | INTRAVENOUS | Status: AC | PRN
  Administered 2020-04-01: 75 mL via INTRAVENOUS

## 2020-04-01 MED ORDER — HEPARIN (PORCINE) 25000 UT/250ML-% IV SOLN: 1000.0000 [IU]/h | INTRAVENOUS | Status: DC

## 2020-04-01 MED ORDER — ASPIRIN 81 MG PO CHEW: 324.0000 mg | CHEWABLE_TABLET | ORAL | Status: DC

## 2020-04-01 NOTE — ED Notes (Signed)
With MD Archie Balboa at bedside during rectal exam. Positive hemoccult

## 2020-04-01 NOTE — Plan of Care (Signed)
  Problem: Education: Goal: Knowledge of General Education information will improve Description: Including pain rating scale, medication(s)/side effects and non-pharmacologic comfort measures Outcome: Progressing   Problem: Clinical Measurements: Goal: Respiratory complications will improve Outcome: Progressing   Problem: Activity: Goal: Risk for activity intolerance will decrease Outcome: Progressing   

## 2020-04-01 NOTE — Progress Notes (Signed)
   04/01/20 1945  Assess: MEWS Score  Temp 98.8 F (37.1 C)  BP (!) 171/101  Pulse Rate (!) 118  SpO2 99 %  O2 Device Room Air  Assess: MEWS Score  MEWS Temp 0  MEWS Systolic 0  MEWS Pulse 2  MEWS RR 1  MEWS LOC 0  MEWS Score 3  MEWS Score Color Yellow  Assess: if the MEWS score is Yellow or Red  Were vital signs taken at a resting state? Yes  Focused Assessment No change from prior assessment  Early Detection of Sepsis Score *See Row Information* Low  MEWS guidelines implemented *See Row Information* Yes  Treat  MEWS Interventions Administered scheduled meds/treatments  Take Vital Signs  Increase Vital Sign Frequency  Yellow: Q 2hr X 2 then Q 4hr X 2, if remains yellow, continue Q 4hrs  Escalate  MEWS: Escalate Yellow: discuss with charge nurse/RN and consider discussing with provider and RRT  Notify: Charge Nurse/RN  Name of Charge Nurse/RN Notified Hinton Lovely, RN  Date Charge Nurse/RN Notified 04/01/20  Time Charge Nurse/RN Notified 1946

## 2020-04-01 NOTE — Consult Note (Signed)
[facility] Cardiology  CARDIOLOGY CONSULT NOTE  Patient ID: Jeff Ward MRN: [id number] DOB/AGE: [id number] y.o.  Admit date: 04/01/2020 Referring Physician [place] Primary Physician Gaynelle Cage Cardiologist Fath Reason for Consultation non-ST elevation myocardial infarction  HPI: 70 year old gentleman referred for evaluation of non-ST elevation myocardial infarction.  The patient has a history of alcoholic liver cirrhosis, who presents with chief complaint of shortness of breath, progressive in nature over the past 4 to 6 weeks.  Patient also reports peripheral edema and persistent nausea.  The patient has chronic anemia, with prior history of esophageal varices, and recent history of bright red blood per rectum due to external and internal hemorrhoids.  Hemoglobin hematocrit were 10.3 and 29.9, respectively.  Patient is not actively bleeding currently.  Initial labs were also notable for elevated high-sensitivity troponin of 1335 and 1153.  Patient denies chest pain.  ECG revealed sinus tachycardia at a rate of 120 bpm with nonspecific ST abnormalities laterally.  CT scan did not reveal evidence for pulmonary embolus.  Review of systems complete and found to be negative unless listed above     Past Medical History:  Diagnosis Date  . Allergy    SEASONAL  . Anemia   . Anxiety   . Arthritis    In back and all joints  . Asthma   . Chronic headache    now resolved  . CKD (chronic kidney disease)   . 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 Depression   . Esophageal varices ([place])   . GERD (gastroesophageal reflux disease)   . GI bleed   . Hiatal hernia   . History of kidney stones 1970's  . Hyperlipidemia 06/2002  . Hypertension   . Liver disease   . Psoriasis   . Stroke ([place])    PT. STATED MILD  . Substance abuse ([place])    H/O ETOH    Past Surgical History:  Procedure Laterality Date  . CARPAL TUNNEL RELEASE  2005    / right hand  . COLONOSCOPY     march  2016  . ESOPHAGEAL BANDING  09/22/2017   Procedure: ESOPHAGEAL BANDING;  Surgeon: Ladene Artist, MD;  Location: [facility];  Service: Endoscopy;;  . ESOPHAGEAL BANDING  10/11/2018   Procedure: ESOPHAGEAL BANDING;  Surgeon: Ladene Artist, MD;  Location: [facility];  Service: Endoscopy;;  . ESOPHAGOGASTRODUODENOSCOPY N/A 07/24/2015   Procedure: ESOPHAGOGASTRODUODENOSCOPY (EGD);  Surgeon: Lucilla Lame, MD;  Location: [facility] ENDOSCOPY;  Service: Endoscopy;  Laterality: N/A;  Banding  . ESOPHAGOGASTRODUODENOSCOPY (EGD) WITH PROPOFOL N/A 08/14/2015   Procedure: ESOPHAGOGASTRODUODENOSCOPY (EGD) WITH PROPOFOL;  Surgeon: Ladene Artist, MD;  Location: [facility] ENDOSCOPY;  Service: Endoscopy;  Laterality: N/A;  . ESOPHAGOGASTRODUODENOSCOPY (EGD) WITH PROPOFOL N/A 09/22/2017   Procedure: ESOPHAGOGASTRODUODENOSCOPY (EGD) WITH PROPOFOL;  Surgeon: Ladene Artist, MD;  Location: [facility]OS[facility];  Service: Endoscopy;  Laterality: N/A;  . ESOPHAGOGASTRODUODENOSCOPY (EGD) WITH PROPOFOL N/A 10/11/2018   Procedure: ESOPHAGOGASTRODUODENOSCOPY (EGD) WITH PROPOFOL;  Surgeon: Ladene Artist, MD;  Location: [facility] ENDOSCOPY;  Service: Endoscopy;  Laterality: N/A;  . TONSILLECTOMY AND ADENOIDECTOMY     70 years old  . VASECTOMY      (Not in a hospital admission)  Social History   Socioeconomic History  . Marital status: Legally Separated    Spouse name: Not on file  . Number of children: 2  . Years of education: Not on file  . Highest education level: Not on file  Occupational History  . Occupation:  retired  Tobacco Use  . Smoking status: Former Smoker    Years: 35.00    Types: Cigars    Quit date: 04/02/2010    Years since quitting: 10.0  . Smokeless tobacco: Never Used  . Tobacco comment: Quit 4 years ago  Vaping Use  . Vaping Use: Never used  Substance and Sexual Activity  . Alcohol use: No    Alcohol/week: 2.0 standard drinks    Types: 2 Cans of beer per week    Comment: Used to be a heavy drinker, quit  for 5 months now  . Drug use: No  . Sexual activity: Yes    Partners: Female    Comment: declined condoms  Other Topics Concern  . Not on file  Social History Narrative   Independent at baseline. Has a cane that he uses occasionally. Lives at home with his wife.   Social Determinants of Health   Financial Resource Strain: Not on file  Food Insecurity: Not on file  Transportation Needs: Not on file  Physical Activity: Not on file  Stress: Not on file  Social Connections: Not on file  Intimate Partner Violence: Not on file    Family History  Problem Relation Age of Onset  . CAD Mother   . CVA Mother   . Heart disease Mother   . Melanoma Father   . CVA Father   . Liver disease Brother   . Breast cancer Paternal Aunt   . Alcohol abuse Maternal Grandfather       Review of systems complete and found to be negative unless listed above      PHYSICAL EXAM  General: Well developed, well nourished, in no acute distress HEENT:  Normocephalic and atramatic Neck:  No JVD.  Lungs: Clear bilaterally to auscultation and percussion. Heart: HRRR . Normal S1 and S2 without gallops or murmurs.  Abdomen: Bowel sounds are positive, abdomen soft and non-tender  Msk:  Back normal, normal gait. Normal strength and tone for age. Extremities: No clubbing, cyanosis or edema.   Neuro: Alert and oriented X 3. Psych:  Good affect, responds appropriately  Labs:   Lab Results  Component Value Date   WBC 7.7 04/01/2020   HGB 10.3 (L) 04/01/2020   HCT 29.9 (L) 04/01/2020   MCV 95.2 04/01/2020   PLT 141 (L) 04/01/2020    Recent Labs  Lab 04/01/20 0808  NA 140  K 3.8  CL 106  CO2 23  BUN 14  CREATININE 0.94  CALCIUM 9.0  GLUCOSE 124*   No results found for: CKTOTAL, CKMB, CKMBINDEX, TROPONINI  Lab Results  Component Value Date   CHOL 119 01/24/2016   CHOL 106 06/19/2015   CHOL 193 12/20/2013   Lab Results  Component Value Date   HDL 24.50 (L) 01/24/2016   HDL 22.60 (L)  06/19/2015   HDL 35 (L) 12/20/2013   Lab Results  Component Value Date   LDLCALC 72 01/24/2016   LDLCALC 69 06/19/2015   LDLCALC 133 (H) 12/20/2013   Lab Results  Component Value Date   TRIG 111.0 01/24/2016   TRIG 71.0 06/19/2015   TRIG 126 12/20/2013   Lab Results  Component Value Date   CHOLHDL 5 01/24/2016   CHOLHDL 5 06/19/2015   CHOLHDL 5.5 (H) 12/20/2013   No results found for: LDLDIRECT    Radiology: DG Chest 2 View  Result Date: 04/01/2020 CLINICAL DATA:  Shortness of breath. EXAM: CHEST - 2 VIEW COMPARISON:  July 24, 2015 FINDINGS:  Streaky opacity in the right base, likely scar. Blunting of the costophrenic angles on the lateral view may represent tiny effusions. The heart, hila, mediastinum, lungs, and pleura are otherwise normal. No overt edema. IMPRESSION: 1. Subsegmental atelectasis or scar in the right base. 2. Probable tiny effusions. 3. No other abnormalities. Electronically Signed   By: Dorise Bullion III M.D   On: 04/01/2020 07:07   CT Angio Chest PE W and/or Wo Contrast  Result Date: 04/01/2020 CLINICAL DATA:  70 year old male with shortness of breath, history of recent COVID infection. EXAM: CT ANGIOGRAPHY CHEST WITH CONTRAST TECHNIQUE: Multidetector CT imaging of the chest was performed using the standard protocol during bolus administration of intravenous contrast. Multiplanar CT image reconstructions and MIPs were obtained to evaluate the vascular anatomy. CONTRAST:  Seventy-five mL Omnipaque 350, intravenous COMPARISON:  CT abdomen pelvis from 07/21/2018 FINDINGS: Cardiovascular: Satisfactory opacification of the pulmonary arteries to the segmental level. No evidence of pulmonary embolism. Normal heart size. No pericardial effusion. Moderate global cardiomegaly. Moderate coronary atherosclerotic calcifications. Aortic valvular calcifications are noted. Atherosclerotic calcifications of the thoracic aorta which is patent and normal in caliber. No pericardial  effusion. Mediastinum/Nodes: No enlarged mediastinal, hilar, or axillary lymph nodes. Thyroid gland, trachea, and esophagus demonstrate no significant findings. Lungs/Pleura: Trace right greater than left bilateral pleural effusions. Mild associated passive atelectasis in the dependent lung bases. Mild linear atelectasis in the lingula. No focal consolidations, suspicious pulmonary nodules, or pneumothorax. Upper Abdomen: The liver is shrunken and nodular in contour. There is large volume bilateral upper quadrant ascites. Musculoskeletal: Bilateral symmetric gynecomastia. No acute or significant osseous findings. Review of the MIP images confirms the above findings. IMPRESSION: Vascular: 1. No evidence of pulmonary embolism. 2. Coronary and aortic atherosclerosis (ICD10-I70.0). 3. Moderate global cardiomegaly. Non-Vascular: 1. Trace bilateral pleural effusions, right greater than left. 2. Hepatic cirrhosis. 3. Large volume upper abdominal ascites. 4. Bilateral symmetric gynecomastia. Ruthann Cancer, MD [facility] Electronically Signed   By: Ruthann Cancer MD   On: 04/01/2020 09:57    EKG: Sinus rhythm with nonspecific ST-T wave abnormalities laterally  ASSESSMENT AND PLAN:   1.  Non-ST elevation myocardial infarction (high-sensitivity troponin 1335, 1153), in the absence of chest pain, with abnormal ECG 2.  Exertional dyspnea, likely multifactorial, secondary to non-ST elevation myocardial infarction, as well labs, alcoholic cirrhosis with ascites and peripheral edema, CT scan negative for pulmonary embolus 3.  Alcoholic cirrhosis 4.  Anemia 6.  Bright red blood per rectum, secondary to external hemorrhoidal bleeding  Recommendations  1.  Agree with overall current therapy 2.  Closely monitor H&H 3.  Cardiac catheterization in a.m.  The risk, benefits and alternatives of cardiac catheterization and possible PCI were explained to the patient  and informed written consent was obtained.  Signed: Isaias Cowman MD,PhD, [facility] 04/01/2020, 1:39 PM

## 2020-04-01 NOTE — ED Notes (Signed)
Paged admission MD to verify anticoagulation orders with + hemoccult

## 2020-04-01 NOTE — ED Triage Notes (Signed)
Pt c/o SOB since being discharged on 3/3 from hospital, worsening over the last day. Pt with hx/o Cirrhosis, esophogeal varices and rapidly fluctuating blood pressures.

## 2020-04-01 NOTE — H&P (Addendum)
History and Physical    Sadarius A Wesby UJW:[id number] DOB: 09-03-1950 DOA: 04/01/2020  PCP: Adin Hector, MD   Patient coming from: Home  I have personally briefly reviewed patient's old medical records in [place]  Chief Complaint: Shortness of breath  HPI: Jeff Ward is a 70 y.o. male with medical history significant for alcoholic liver cirrhosis, GERD, dyslipidemia, depression who presents to the ER for evaluation of shortness of breath.  The patient was discharged from the hospital on 03/01/20 and states that since his discharge he has had trouble breathing.  Shortness of breath is mostly exertional and associated with bilateral lower extremity swelling, nausea and palpitations.  He has had constipation as well as bright red blood per rectum.  Patient was placed on MiraLAX for constipation but unable to tolerate it.  He has had worsening abdominal distention despite taking his diuretic therapy. He denies having any chest pain, abdominal pain, no fever, no chills, no urinary frequency, no dysuria, no nocturia, no hematuria, no cough, no sore throat, no blurred vision, no headache, no weakness. Labs show sodium 140, potassium 3.8, chloride 106, bicarb 23, glucose 124, BUN 14, creatinine 0.94, calcium 9.0, troponin 1335 >>1153, White count 7.7, hemoglobin 10.3, hematocrit 29.9, MCV 95.2, RDW 15.1, platelet count 141 CT angiogram of the chest showed no evidence of pulmonary embolism. Coronary and aortic atherosclerosis . Moderate global cardiomegaly. Trace bilateral pleural effusions, right greater than left. Hepatic cirrhosis. Large volume upper abdominal ascites.Bilateral symmetric gynecomastia. Chest x-ray reviewed by me shows subsegmental atelectasis or scar in the right base. Probable tiny effusions. No other abnormalities.    ED Course: Patient is a 70 year old male with a history of alcoholic liver cirrhosis who presents to the ER for evaluation of worsening shortness of  breath mostly with exertion associated with bilateral lower extremity swelling, palpitations, nausea and increased abdominal girth.  Patient noted to have a bump in his troponin to over 1000.  Twelve-lead EKG shows sinus tachycardia with no acute ST or T wave changes.  He will be admitted to the hospital for further evaluation.   Review of Systems: As per HPI otherwise all other systems reviewed and negative.    Past Medical History:  Diagnosis Date  . Allergy    SEASONAL  . Anemia   . Anxiety   . Arthritis    In back and all joints  . Asthma   . Chronic headache    now resolved  . CKD (chronic kidney disease)   . 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 Depression   . Esophageal varices ([place])   . GERD (gastroesophageal reflux disease)   . GI bleed   . Hiatal hernia   . History of kidney stones 1970's  . Hyperlipidemia 06/2002  . Hypertension   . Liver disease   . Psoriasis   . Stroke ([place])    PT. STATED MILD  . Substance abuse ([place])    H/O ETOH    Past Surgical History:  Procedure Laterality Date  . CARPAL TUNNEL RELEASE  2005    / right hand  . COLONOSCOPY     march 2016  . ESOPHAGEAL BANDING  09/22/2017   Procedure: ESOPHAGEAL BANDING;  Surgeon: Ladene Artist, MD;  Location: [facility];  Service: Endoscopy;;  . ESOPHAGEAL BANDING  10/11/2018   Procedure: ESOPHAGEAL BANDING;  Surgeon: Ladene Artist, MD;  Location: [facility];  Service: Endoscopy;;  . ESOPHAGOGASTRODUODENOSCOPY N/A 07/24/2015  Procedure: ESOPHAGOGASTRODUODENOSCOPY (EGD);  Surgeon: Lucilla Lame, MD;  Location: [facility] ENDOSCOPY;  Service: Endoscopy;  Laterality: N/A;  Banding  . ESOPHAGOGASTRODUODENOSCOPY (EGD) WITH PROPOFOL N/A 08/14/2015   Procedure: ESOPHAGOGASTRODUODENOSCOPY (EGD) WITH PROPOFOL;  Surgeon: Ladene Artist, MD;  Location: [facility] ENDOSCOPY;  Service: Endoscopy;  Laterality: N/A;  . ESOPHAGOGASTRODUODENOSCOPY (EGD) WITH PROPOFOL N/A 09/22/2017   Procedure:  ESOPHAGOGASTRODUODENOSCOPY (EGD) WITH PROPOFOL;  Surgeon: Ladene Artist, MD;  Location: [facility];  Service: Endoscopy;  Laterality: N/A;  . ESOPHAGOGASTRODUODENOSCOPY (EGD) WITH PROPOFOL N/A 10/11/2018   Procedure: ESOPHAGOGASTRODUODENOSCOPY (EGD) WITH PROPOFOL;  Surgeon: Ladene Artist, MD;  Location: [facility] ENDOSCOPY;  Service: Endoscopy;  Laterality: N/A;  . TONSILLECTOMY AND ADENOIDECTOMY     70 years old  . VASECTOMY       reports that he quit smoking about 10 years ago. His smoking use included cigars. He quit after 35.00 years of use. He has never used smokeless tobacco. He reports that he does not drink alcohol and does not use drugs.  Allergies  Allergen Reactions  . Bee Venom Anaphylaxis  . Duloxetine Nausea Only  . Sulfa Antibiotics Other (See Comments)    Reaction:  Fainting     Family History  Problem Relation Age of Onset  . CAD Mother   . CVA Mother   . Heart disease Mother   . Melanoma Father   . CVA Father   . Liver disease Brother   . Breast cancer Paternal Aunt   . Alcohol abuse Maternal Grandfather       Prior to Admission medications   Medication Sig Start Date End Date Taking? Authorizing Provider  busPIRone (BUSPAR) 7.5 MG tablet Take 2 tablets by mouth in the morning and at bedtime. 12/27/19   [provider]  Lactulose 20 GM/30ML SOLN Take 20 mLs (13.3333 g total) by mouth in the morning and at bedtime. 03/07/20 04/06/20  Ladene Artist, MD  losartan (COZAAR) 50 MG tablet Take 50 mg by mouth daily as needed. 01/19/20   [provider]  pantoprazole (PROTONIX) 40 MG tablet Take 1 tablet (40 mg total) by mouth daily. 03/05/16   Ladene Artist, MD  rifaximin (XIFAXAN) 550 MG TABS tablet Take 1 tablet (550 mg total) by mouth 2 (two) times daily. 03/01/20   Wouk, Ailene Rud, MD  sertraline (ZOLOFT) 25 MG tablet Take 25 mg by mouth daily. 02/23/20   [provider]  tiZANidine (ZANAFLEX) 2 MG tablet Take 2 mg by mouth 3 (three)  times daily. 12/19/19   [provider]  VENTOLIN HFA 108 (90 Base) MCG/ACT inhaler Inhale 2 puffs into the lungs every 6 (six) hours as needed for wheezing or shortness of breath.  07/28/16   [provider]    Physical Exam: Vitals:   04/01/20 0830 04/01/20 0900 04/01/20 1000 04/01/20 1100  BP: (!) 182/97 (!) 165/96 (!) 156/87 (!) 152/97  Pulse: (!) 113 (!) 112 (!) 110 (!) 113  Resp: (!) 21 (!) 28 (!) 8 14  Temp:      TempSrc:      SpO2: 98% 96% 98% 100%     Vitals:   04/01/20 0830 04/01/20 0900 04/01/20 1000 04/01/20 1100  BP: (!) 182/97 (!) 165/96 (!) 156/87 (!) 152/97  Pulse: (!) 113 (!) 112 (!) 110 (!) 113  Resp: (!) 21 (!) 28 (!) 8 14  Temp:      TempSrc:      SpO2: 98% 96% 98% 100%  Constitutional: Alert and oriented x 3 . Not in any apparent distress. HEENT:      Head: Normocephalic and atraumatic.         Eyes: PERLA, EOMI, Conjunctivae pallor. Sclera is non-icteric.       Mouth/Throat: Mucous membranes are moist.       Neck: Supple with no signs of meningismus. Cardiovascular:  Tachycardia. No murmurs, gallops, or rubs. 2+ symmetrical distal pulses are present . No JVD. 3+ LE edema Respiratory: Respiratory effort normal .bilateral air entry in both lung fields Gastrointestinal:  Firm,  distended with positive bowel sounds.  Genitourinary: No CVA tenderness. Musculoskeletal: Nontender with normal range of motion in all extremities. No cyanosis, or erythema of extremities. Neurologic:  Face is symmetric. Moving all extremities. No gross focal neurologic deficits  Skin: Skin is warm, dry.  No rash or ulcers Psychiatric: Mood and affect are normal   Labs on Admission: I have personally reviewed following labs and imaging studies  CBC: Recent Labs  Lab 04/01/20 0808  WBC 7.7  HGB 10.3*  HCT 29.9*  MCV 95.2  PLT 937*   Basic Metabolic Panel: Recent Labs  Lab 04/01/20 0808  NA 140  K 3.8  CL 106  CO2 23  GLUCOSE 124*  BUN 14   CREATININE 0.94  CALCIUM 9.0   GFR: CrCl cannot be calculated (Unknown ideal weight.). Liver Function Tests: No results for input(s): AST, ALT, ALKPHOS, BILITOT, PROT, ALBUMIN in the last 168 hours. No results for input(s): LIPASE, AMYLASE in the last 168 hours. No results for input(s): AMMONIA in the last 168 hours. Coagulation Profile: No results for input(s): INR, PROTIME in the last 168 hours. Cardiac Enzymes: No results for input(s): CKTOTAL, CKMB, CKMBINDEX, TROPONINI in the last 168 hours. BNP (last 3 results) No results for input(s): PROBNP in the last 8760 hours. HbA1C: No results for input(s): HGBA1C in the last 72 hours. CBG: No results for input(s): GLUCAP in the last 168 hours. Lipid Profile: No results for input(s): CHOL, HDL, LDLCALC, TRIG, CHOLHDL, LDLDIRECT in the last 72 hours. Thyroid Function Tests: No results for input(s): TSH, T4TOTAL, FREET4, T3FREE, THYROIDAB in the last 72 hours. Anemia Panel: No results for input(s): VITAMINB12, FOLATE, FERRITIN, TIBC, IRON, RETICCTPCT in the last 72 hours. Urine analysis:    Component Value Date/Time   COLORURINE STRAW (A) 11/23/2015 1605   APPEARANCEUR CLEAR (A) 11/23/2015 1605   LABSPEC 1.008 11/23/2015 1605   PHURINE 5.0 11/23/2015 1605   GLUCOSEU NEGATIVE 11/23/2015 1605   HGBUR 1+ (A) 11/23/2015 1605   BILIRUBINUR NEGATIVE 11/23/2015 1605   KETONESUR NEGATIVE 11/23/2015 1605   PROTEINUR NEGATIVE 11/23/2015 1605   NITRITE NEGATIVE 11/23/2015 1605   LEUKOCYTESUR NEGATIVE 11/23/2015 1605    Radiological Exams on Admission: DG Chest 2 View  Result Date: 04/01/2020 CLINICAL DATA:  Shortness of breath. EXAM: CHEST - 2 VIEW COMPARISON:  July 24, 2015 FINDINGS: Streaky opacity in the right base, likely scar. Blunting of the costophrenic angles on the lateral view may represent tiny effusions. The heart, hila, mediastinum, lungs, and pleura are otherwise normal. No overt edema. IMPRESSION: 1. Subsegmental  atelectasis or scar in the right base. 2. Probable tiny effusions. 3. No other abnormalities. Electronically Signed   By: Dorise Bullion III M.D   On: 04/01/2020 07:07   CT Angio Chest PE W and/or Wo Contrast  Result Date: 04/01/2020 CLINICAL DATA:  70 year old male with shortness of breath, history of recent COVID infection. EXAM: CT ANGIOGRAPHY CHEST WITH  CONTRAST TECHNIQUE: Multidetector CT imaging of the chest was performed using the standard protocol during bolus administration of intravenous contrast. Multiplanar CT image reconstructions and MIPs were obtained to evaluate the vascular anatomy. CONTRAST:  Seventy-five mL Omnipaque 350, intravenous COMPARISON:  CT abdomen pelvis from 07/21/2018 FINDINGS: Cardiovascular: Satisfactory opacification of the pulmonary arteries to the segmental level. No evidence of pulmonary embolism. Normal heart size. No pericardial effusion. Moderate global cardiomegaly. Moderate coronary atherosclerotic calcifications. Aortic valvular calcifications are noted. Atherosclerotic calcifications of the thoracic aorta which is patent and normal in caliber. No pericardial effusion. Mediastinum/Nodes: No enlarged mediastinal, hilar, or axillary lymph nodes. Thyroid gland, trachea, and esophagus demonstrate no significant findings. Lungs/Pleura: Trace right greater than left bilateral pleural effusions. Mild associated passive atelectasis in the dependent lung bases. Mild linear atelectasis in the lingula. No focal consolidations, suspicious pulmonary nodules, or pneumothorax. Upper Abdomen: The liver is shrunken and nodular in contour. There is large volume bilateral upper quadrant ascites. Musculoskeletal: Bilateral symmetric gynecomastia. No acute or significant osseous findings. Review of the MIP images confirms the above findings. IMPRESSION: Vascular: 1. No evidence of pulmonary embolism. 2. Coronary and aortic atherosclerosis (ICD10-I70.0). 3. Moderate global cardiomegaly.  Non-Vascular: 1. Trace bilateral pleural effusions, right greater than left. 2. Hepatic cirrhosis. 3. Large volume upper abdominal ascites. 4. Bilateral symmetric gynecomastia. Ruthann Cancer, MD [facility] Electronically Signed   By: Ruthann Cancer MD   On: 04/01/2020 09:57     Assessment/Plan Principal Problem:   NSTEMI (non-ST elevated myocardial infarction) [facility]) Active Problems:   Rectal bleeding   Depression   GERD (gastroesophageal reflux disease)   Alcoholic cirrhosis of liver with ascites ([place])     Non-ST elevation MI Patient presents for evaluation of worsening shortness of breath mostly with exertion associated with palpitations, nausea and bilateral lower extremity swelling. Twelve-lead EKG shows sinus tachycardia with no acute ST-T wave changes Patient's troponin level is elevated Patient received a dose of aspirin in the emergency room and is not on anticoagulation with heparin due to concerns for GI bleed.  He has had bright red blood per rectum over the last 24 hours. Place patient on statins Patient not on beta-blockers because of hypotension in the past Obtain 2D echocardiogram to assess LVEF and wall regional wall motion abnormality    Alcoholic cirrhosis of liver with ascites Patient noted to have massive ascites despite being compliant with his diuretic therapy He most likely will benefit from paracentesis during this hospitalization Patient most likely has internal hemorrhoids and has had episodes of bright red blood per rectum with the last episode over 24 hours ago. We will monitor H&H closely Continue lactulose, rifaximin and furosemide Maintain low-sodium diet as well as 1.5 L fluid restriction    Depression Continue buspirone and sertraline    GERD Continue Protonix    DVT prophylaxis: SCD Code Status: full code Family Communication: Greater than 50% of time was spent discussing  patient's condition and plan of care with him at the bedside.  All questions and concerns have been addressed.  He verbalizes understanding and agrees with the plan.  He lists his daughter Abhiram Criado as his healthcare power of attorney.  CODE STATUS was discussed and he is a full code Disposition Plan: Back to previous home environment Consults called: Cardiology Status: At the time of admission, it appears that the appropriate admission status for this patient is inpatient.  This is judged to be reasonable and necessary in order to  provide the required intensity of service to ensure the patient's safety given the presenting symptoms, physical exam findings and initial radiographic and lab data in the context of the comorbid conditions. Patient requires inpatient status due to high intensity of service, high risk for further deterioration and high frequency of surveillance required.   I certify that at the point of admission it is my clinical judgment that the patient will require inpatient hospital care spanning beyond 2 midnights.    Collier Bullock MD [facility]     04/01/2020, 1:28 PM

## 2020-04-01 NOTE — Progress Notes (Addendum)
Pt was admitted on the floor with no signs of distress. Pt alert x 4. VSS except 171/101. Pt was educated about safety and ascome within pt reach. Will continue to monitor. Will continue to monitor.  Update 0416: Unable to obtaine consent form per pt provider have not discuss the pros and cons of procedure. Will continue to monitor.

## 2020-04-01 NOTE — ED Provider Notes (Signed)
[facility] Emergency Department Provider Note    ____________________________________________   I have reviewed the triage vital signs and the nursing notes.   HISTORY  Chief Complaint Shortness of Breath   History limited by: Not Limited   HPI Jeff Ward is a 70 y.o. male who presents to the emergency department today because of concerns for nausea as well as shortness of breath.  Terms of the nausea patient states this is been going on since he left the hospital roughly 1 month ago.  He states that whenever he tries taking his MiraLAX becomes more nauseous.  He states for the past 3 days he has not been able to sleep because of the nausea.  Additionally the patient has had some right-sided abdominal pain although he states that this is chronic for him.  It is not new since the nausea started.  He also has secondary complaint of shortness of breath that is also been going on over the past month.  This too has been getting worse over the past few days.  He denies any associated chest pain or cough.  Denies any recent fevers.   Records reviewed. Per medical record review patient has a history of HTN, anemia, GERD, HLD, cirrhosis with varices.   Past Medical History:  Diagnosis Date  . Allergy    SEASONAL  . Anemia   . Anxiety   . Arthritis    In back and all joints  . Asthma   . Chronic headache    now resolved  . CKD (chronic kidney disease)   . 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 Depression   . Esophageal varices ([place])   . GERD (gastroesophageal reflux disease)   . GI bleed   . Hiatal hernia   . History of kidney stones 1970's  . Hyperlipidemia 06/2002  . Hypertension   . Liver disease   . Psoriasis   . Stroke ([place])    PT. STATED MILD  . Substance abuse ([place])    H/O ETOH    Patient Active Problem List   Diagnosis Date Noted  . Hypokalemia 02/29/2020  . Hypotension 02/29/2020  . Prolonged QT interval 02/29/2020   . Symptomatic anemia 02/29/2020  . Occult blood in stools 02/29/2020  . Thrombocytopenia ([place]) 02/29/2020  . SI (sacroiliac) joint inflammation ([place]) 03/31/2016  . Encephalopathy, hepatic ([place]) 03/05/2016  . Esophageal varices without bleeding ([place]) 03/05/2016  . Right foot pain 01/31/2016  . Hyperkalemia 01/31/2016  . Alcoholic cirrhosis of liver without ascites ([place]) 08/06/2015  . Secondary esophageal varices with bleeding ([place])   . Alcoholic cirrhosis ([place]) 94/17/4081  . Liver cirrhosis ([place]) 06/19/2015  . GERD (gastroesophageal reflux disease) 03/22/2015  . Personal history of colonic polyps 04/27/2013  . Depression 02/17/2013  . Hyperglycemia 07/23/2011  . OSA (obstructive sleep apnea) 04/10/2011  . Testosterone deficiency 03/06/2011  . Degenerative disc disease, lumbar 05/09/2010  . OTHER NONSPECIFIC ABNORMAL SERUM ENZYME LEVELS 05/23/2009  . Essential hypertension, benign 09/01/2007  . COLONIC POLYPS 10/23/2006  . HLD (hyperlipidemia) 10/23/2006  . PUD 10/23/2006  . 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 CARPAL TUNNEL SYNDROME, BILATERAL, HX OF 10/23/2006  . BENIGN PROSTATIC HYPERTROPHY, HX OF 10/23/2006    Past Surgical History:  Procedure Laterality Date  . CARPAL TUNNEL RELEASE  2005    / right hand  . COLONOSCOPY     march 2016  . ESOPHAGEAL BANDING  09/22/2017   Procedure: ESOPHAGEAL BANDING;  Surgeon: Lucio Edward  T, MD;  Location: [facility] ENDOSCOPY;  Service: Endoscopy;;  . ESOPHAGEAL BANDING  10/11/2018   Procedure: ESOPHAGEAL BANDING;  Surgeon: Ladene Artist, MD;  Location: [facility] ENDOSCOPY;  Service: Endoscopy;;  . ESOPHAGOGASTRODUODENOSCOPY N/A 07/24/2015   Procedure: ESOPHAGOGASTRODUODENOSCOPY (EGD);  Surgeon: Lucilla Lame, MD;  Location[facility] ENDOSCOPY;  Service: Endoscopy;  Laterality: N/A;  Banding  . ESOPHAGOGASTRODUODENOSCOPY (EGD) WITH PROPOFOL N/A 08/14/2015   Procedure: ESOPHAGOGASTRODUODENOSCOPY (EGD) WITH PROPOFOL;  Surgeon: Ladene Artist, MD;   Location: [facility] ENDOSCOPY;  Service: Endoscopy;  Laterality: N/A;  . ESOPHAGOGASTRODUODENOSCOPY (EGD) WITH PROPOFOL N/A 09/22/2017   Procedure: ESOPHAGOGASTRODUODENOSCOPY (EGD) WITH PROPOFOL;  Surgeon: Ladene Artist, MD;  Location: [facility] ENDOSCOPY;  Service: Endoscopy;  Laterality: N/A;  . ESOPHAGOGASTRODUODENOSCOPY (EGD) WITH PROPOFOL N/A 10/11/2018   Procedure: ESOPHAGOGASTRODUODENOSCOPY (EGD) WITH PROPOFOL;  Surgeon: Ladene Artist, MD;  Location: [facility] ENDOSCOPY;  Service: Endoscopy;  Laterality: N/A;  . TONSILLECTOMY AND ADENOIDECTOMY     70 years old  . VASECTOMY      Prior to Admission medications   Medication Sig Start Date End Date Taking? Authorizing Provider  busPIRone (BUSPAR) 7.5 MG tablet Take 2 tablets by mouth in the morning and at bedtime. 12/27/19   [provider]  Lactulose 20 GM/30ML SOLN Take 20 mLs (13.3333 g total) by mouth in the morning and at bedtime. 03/07/20 04/06/20  Ladene Artist, MD  losartan (COZAAR) 50 MG tablet Take 50 mg by mouth daily as needed. 01/19/20   [provider]  pantoprazole (PROTONIX) 40 MG tablet Take 1 tablet (40 mg total) by mouth daily. 03/05/16   Ladene Artist, MD  rifaximin (XIFAXAN) 550 MG TABS tablet Take 1 tablet (550 mg total) by mouth 2 (two) times daily. 03/01/20   Wouk, Ailene Rud, MD  sertraline (ZOLOFT) 25 MG tablet Take 25 mg by mouth daily. 02/23/20   [provider]  tiZANidine (ZANAFLEX) 2 MG tablet Take 2 mg by mouth 3 (three) times daily. 12/19/19   [provider]  VENTOLIN HFA 108 (90 Base) MCG/ACT inhaler Inhale 2 puffs into the lungs every 6 (six) hours as needed for wheezing or shortness of breath.  07/28/16   [provider]    Allergies Bee venom, Duloxetine, and Sulfa antibiotics  Family History  Problem Relation Age of Onset  . CAD Mother   . CVA Mother   . Heart disease Mother   . Melanoma Father   . CVA Father   . Liver disease Brother   . Breast cancer Paternal Aunt    . Alcohol abuse Maternal Grandfather     Social History Social History   Tobacco Use  . Smoking status: Former Smoker    Years: 35.00    Types: Cigars    Quit date: 04/02/2010    Years since quitting: 10.0  . Smokeless tobacco: Never Used  . Tobacco comment: Quit 4 years ago  Vaping Use  . Vaping Use: Never used  Substance Use Topics  . Alcohol use: No    Alcohol/week: 2.0 standard drinks    Types: 2 Cans of beer per week    Comment: Used to be a heavy drinker, quit for 5 months now  . Drug use: No    Review of Systems Constitutional: No fever/chills Eyes: No visual changes. ENT: No sore throat. Cardiovascular: Denies chest pain. Respiratory: Positive for shortness of breath. Gastrointestinal: Positive for right sided abdominal pain.  Genitourinary: Negative for dysuria. Musculoskeletal: Negative for back pain. Skin: Negative for  rash. Neurological: Negative for headaches, focal weakness or numbness.  ____________________________________________   PHYSICAL EXAM:  VITAL SIGNS: ED Triage Vitals [04/01/20 0635]  Enc Vitals Group     BP (!) 174/100     Pulse Rate (!) 121     Resp 18     Temp 99.1 F (37.3 C)     Temp Source Oral     SpO2 98 %   Constitutional: Alert and oriented.  Eyes: Conjunctivae are normal.  ENT      Head: Normocephalic and atraumatic.      Nose: No congestion/rhinnorhea.      Mouth/Throat: Mucous membranes are moist.      Neck: No stridor. Hematological/Lymphatic/Immunilogical: No cervical lymphadenopathy. Cardiovascular: Tachycardic, regular rhythm.  No murmurs, rubs, or gallops.  Respiratory: Normal respiratory effort without tachypnea nor retractions. Breath sounds are clear and equal bilaterally. No wheezes/rales/rhonchi. Gastrointestinal: Soft and tender to palpation in the right abdomen.  Genitourinary: Deferred Musculoskeletal: Normal range of motion in all extremities. Positive for lower extremity edema.  Neurologic:  Normal  speech and language. No gross focal neurologic deficits are appreciated.  Skin:  Skin is warm, dry and intact. No rash noted. Psychiatric: Mood and affect are normal. Speech and behavior are normal. Patient exhibits appropriate insight and judgment.  ____________________________________________    LABS (pertinent positives/negatives)  Trop hs 1335 BMP wnl except glu 124 CBC wbc 7.7, hgb 10.3, plt 141  ____________________________________________   EKG  I, Nance Pear, attending physician, personally viewed and interpreted this EKG  EKG Time: 0632 Rate: 120 Rhythm: sinus tachycardia Axis: left axis deviation Intervals: qtc 483 QRS: narrow ST changes: no st elevation Impression: abnormal ekg   ____________________________________________    RADIOLOGY  CXR Some scarring. Small bilateral pleural effusions.   ____________________________________________   PROCEDURES  Procedures  CRITICAL CARE Performed by: Nance Pear   Total critical care time: 35 minutes  Critical care time was exclusive of separately billable procedures and treating other patients.  Critical care was necessary to treat or prevent imminent or life-threatening deterioration.  Critical care was time spent personally by me on the following activities: development of treatment plan with patient and/or surrogate as well as nursing, discussions with consultants, evaluation of patient's response to treatment, examination of patient, obtaining history from patient or surrogate, ordering and performing treatments and interventions, ordering and review of laboratory studies, ordering and review of radiographic studies, pulse oximetry and re-evaluation of patient's condition.  ____________________________________________   INITIAL IMPRESSION / ASSESSMENT AND PLAN / ED COURSE  Pertinent labs & imaging results that were available during my care of the patient were reviewed by me and considered  in my medical decision making (see chart for details).   Patient presented to the emergency department today because of concerns for nausea and shortness of breath.  Patient states symptoms have been getting worse over the past few days.  Patient will be taken work-up is notable for significantly elevated living.  Given recent hospitalization 30 I did have concern for possible pulmonary embolism.  CT angio however did not show any evidence of pulmonary embolism.  Did have concerns of the patient's elevated troponin could be related to obesity the patient was guaiac positive.  So I did not feel comfortable starting heparin patient is not complaining of any chest pain at this point.  EKG without any ST elevation.  Will plan on admission to hospital service for further work-up and evaluation.   ____________________________________________   FINAL CLINICAL IMPRESSION(S) /  ED DIAGNOSES  Final diagnoses:  Shortness of breath  Elevated troponin  Gastrointestinal hemorrhage, unspecified gastrointestinal hemorrhage type     Note: This dictation was prepared with Dragon dictation. Any transcriptional errors that result from this process are unintentional     Nance Pear, MD 04/01/20 1155

## 2020-04-01 NOTE — ED Notes (Signed)
ED Provider at bedside. 

## 2020-04-02 ENCOUNTER — Inpatient Hospital Stay (HOSPITAL_COMMUNITY)
Admit: 2020-04-02 | Discharge: 2020-04-02 | Disposition: A | Discharge status: Home / Self Care | Payer: Medicare HMO | Attending: Internal Medicine | Admitting: Internal Medicine

## 2020-04-02 ENCOUNTER — Inpatient Hospital Stay: Payer: Medicare HMO

## 2020-04-02 ENCOUNTER — Encounter
Admission: EM | Disposition: A | Discharge status: Home / Self Care | Payer: Self-pay | Source: Home / Self Care | Attending: Internal Medicine

## 2020-04-02 ENCOUNTER — Encounter: Payer: Self-pay | Admitting: Cardiology

## 2020-04-02 DIAGNOSIS — K7031 Alcoholic cirrhosis of liver with ascites: Secondary | ICD-10-CM | POA: Diagnosis not present

## 2020-04-02 DIAGNOSIS — I214 Non-ST elevation (NSTEMI) myocardial infarction: Secondary | ICD-10-CM

## 2020-04-02 HISTORY — PX: LEFT HEART CATH: CATH118248

## 2020-04-02 LAB — AMYLASE, PLEURAL OR PERITONEAL FLUID: Amylase, Fluid: 18 U/L

## 2020-04-02 LAB — BASIC METABOLIC PANEL
Anion gap: 8 (ref 5–15)
BUN: 15 mg/dL (ref 8–23)
CO2: 24 mmol/L (ref 22–32)
Calcium: 8.7 mg/dL — ABNORMAL LOW (ref 8.9–10.3)
Chloride: 108 mmol/L (ref 98–111)
Creatinine, Ser: 1 mg/dL (ref 0.61–1.24)
GFR, Estimated: >60 mL/min (ref >60)
Glucose, Bld: 109 mg/dL — ABNORMAL HIGH (ref 70–99)
Potassium: 3.8 mmol/L (ref 3.5–5.1)
Sodium: 140 mmol/L (ref 135–145)

## 2020-04-02 LAB — LIPID PANEL
Cholesterol: 102 mg/dL (ref 0–200)
HDL: 36 mg/dL — ABNORMAL LOW (ref >40)
LDL Cholesterol: 55 mg/dL (ref 0–99)
Total CHOL/HDL Ratio: 2.8 RATIO
Triglycerides: 57 mg/dL (ref <150)
VLDL: 11 mg/dL (ref 0–40)

## 2020-04-02 LAB — CBC
HCT: 29.9 % — ABNORMAL LOW (ref 39.0–52.0)
Hemoglobin: 10.1 g/dL — ABNORMAL LOW (ref 13.0–17.0)
MCH: 32.2 pg (ref 26.0–34.0)
MCHC: 33.8 g/dL (ref 30.0–36.0)
MCV: 95.2 fL (ref 80.0–100.0)
Platelets: 141 10*3/uL — ABNORMAL LOW (ref 150–400)
RBC: 3.14 MIL/uL — ABNORMAL LOW (ref 4.22–5.81)
RDW: 15.3 % (ref 11.5–15.5)
WBC: 8.5 10*3/uL (ref 4.0–10.5)
nRBC: 0 % (ref 0.0–0.2)

## 2020-04-02 LAB — GLUCOSE, PLEURAL OR PERITONEAL FLUID: Glucose, Fluid: 142 mg/dL

## 2020-04-02 LAB — PROTEIN, PLEURAL OR PERITONEAL FLUID: Total protein, fluid: <3 g/dL

## 2020-04-02 LAB — ALBUMIN, PLEURAL OR PERITONEAL FLUID: Albumin, Fluid: <1 g/dL

## 2020-04-02 LAB — BODY FLUID CELL COUNT WITH DIFFERENTIAL
Eos, Fluid: 0 %
Lymphs, Fluid: 10 %
Monocyte-Macrophage-Serous Fluid: 53 %
Neutrophil Count, Fluid: 36 %
Total Nucleated Cell Count, Fluid: 339 cu mm

## 2020-04-02 SURGERY — LEFT HEART CATH
Anesthesia: Moderate Sedation

## 2020-04-02 MED ORDER — HYDRALAZINE HCL 20 MG/ML IJ SOLN: 10.0000 mg | INTRAMUSCULAR | Status: AC | PRN

## 2020-04-02 MED ORDER — LABETALOL HCL 5 MG/ML IV SOLN
10.0000 mg | INTRAVENOUS | Status: AC | PRN
  Administered 2020-04-02: 10 mg via INTRAVENOUS
  Filled 2020-04-02: qty 4

## 2020-04-02 MED ORDER — IOHEXOL 300 MG/ML  SOLN
INTRAMUSCULAR | Status: DC | PRN
  Administered 2020-04-02: 96 mL

## 2020-04-02 MED ORDER — SODIUM CHLORIDE 0.9 % IV SOLN: 250.0000 mL | INTRAVENOUS | Status: DC | PRN

## 2020-04-02 MED ORDER — SODIUM CHLORIDE 0.9 % WEIGHT BASED INFUSION
1.0000 mL/kg/h | INTRAVENOUS | Status: DC
  Administered 2020-04-02: 1 mL/kg/h via INTRAVENOUS

## 2020-04-02 MED ORDER — VERAPAMIL HCL 2.5 MG/ML IV SOLN
INTRAVENOUS | Status: DC | PRN
  Administered 2020-04-02: 2.5 mg via INTRA_ARTERIAL

## 2020-04-02 MED ORDER — MIDAZOLAM HCL 2 MG/2ML IJ SOLN
INTRAMUSCULAR | Status: AC
  Filled 2020-04-02: qty 2

## 2020-04-02 MED ORDER — MIDAZOLAM HCL 2 MG/2ML IJ SOLN
INTRAMUSCULAR | Status: DC | PRN
  Administered 2020-04-02: 1 mg via INTRAVENOUS

## 2020-04-02 MED ORDER — SODIUM CHLORIDE 0.9 % WEIGHT BASED INFUSION: 3.0000 mL/kg/h | INTRAVENOUS | Status: DC

## 2020-04-02 MED ORDER — SPIRONOLACTONE 25 MG PO TABS
50.0000 mg | ORAL_TABLET | Freq: Every day | ORAL | Status: DC
  Administered 2020-04-02: 50 mg via ORAL
  Filled 2020-04-02: qty 2

## 2020-04-02 MED ORDER — SPIRONOLACTONE 25 MG PO TABS
100.0000 mg | ORAL_TABLET | Freq: Every day | ORAL | Status: DC
  Administered 2020-04-03: 100 mg via ORAL
  Filled 2020-04-02: qty 4

## 2020-04-02 MED ORDER — SODIUM CHLORIDE 0.9 % WEIGHT BASED INFUSION
3.0000 mL/kg/h | INTRAVENOUS | Status: AC
  Administered 2020-04-02: 3 mL/kg/h via INTRAVENOUS

## 2020-04-02 MED ORDER — POLYVINYL ALCOHOL 1.4 % OP SOLN
1.0000 [drp] | OPHTHALMIC | Status: DC | PRN
  Filled 2020-04-02: qty 15

## 2020-04-02 MED ORDER — SODIUM CHLORIDE 0.9% FLUSH: 3.0000 mL | INTRAVENOUS | Status: DC | PRN

## 2020-04-02 MED ORDER — FUROSEMIDE 40 MG PO TABS
40.0000 mg | ORAL_TABLET | Freq: Every day | ORAL | Status: DC
  Administered 2020-04-02 – 2020-04-04 (×3): 40 mg via ORAL
  Filled 2020-04-02 (×4): qty 1

## 2020-04-02 MED ORDER — HEPARIN SODIUM (PORCINE) 1000 UNIT/ML IJ SOLN
INTRAMUSCULAR | Status: AC
  Filled 2020-04-02: qty 1

## 2020-04-02 MED ORDER — VERAPAMIL HCL 2.5 MG/ML IV SOLN
INTRAVENOUS | Status: AC
  Filled 2020-04-02: qty 2

## 2020-04-02 MED ORDER — ALBUMIN HUMAN 25 % IV SOLN
50.0000 g | Freq: Once | INTRAVENOUS | Status: AC
  Administered 2020-04-02: 50 g via INTRAVENOUS
  Filled 2020-04-02: qty 200

## 2020-04-02 MED ORDER — ASPIRIN 81 MG PO CHEW: 81.0000 mg | CHEWABLE_TABLET | ORAL | Status: DC

## 2020-04-02 MED ORDER — SODIUM CHLORIDE 0.9 % WEIGHT BASED INFUSION: 1.0000 mL/kg/h | INTRAVENOUS | Status: DC

## 2020-04-02 MED ORDER — ASPIRIN 81 MG PO CHEW
81.0000 mg | CHEWABLE_TABLET | ORAL | Status: AC
  Administered 2020-04-02: 81 mg via ORAL
  Filled 2020-04-02: qty 1

## 2020-04-02 MED ORDER — SODIUM CHLORIDE 0.9% FLUSH
3.0000 mL | Freq: Two times a day (BID) | INTRAVENOUS | Status: DC
  Administered 2020-04-03 – 2020-04-05 (×6): 3 mL via INTRAVENOUS

## 2020-04-02 MED ORDER — HEPARIN (PORCINE) IN NACL 1000-0.9 UT/500ML-% IV SOLN
INTRAVENOUS | Status: AC
  Filled 2020-04-02: qty 1000

## 2020-04-02 MED ORDER — HEPARIN (PORCINE) IN NACL 1000-0.9 UT/500ML-% IV SOLN
INTRAVENOUS | Status: DC | PRN
  Administered 2020-04-02: 1000 mL

## 2020-04-02 MED ORDER — PANTOPRAZOLE SODIUM 40 MG IV SOLR
40.0000 mg | Freq: Two times a day (BID) | INTRAVENOUS | Status: DC
  Administered 2020-04-02 – 2020-04-04 (×6): 40 mg via INTRAVENOUS
  Filled 2020-04-02 (×6): qty 40

## 2020-04-02 MED ORDER — FENTANYL CITRATE (PF) 100 MCG/2ML IJ SOLN
INTRAMUSCULAR | Status: DC | PRN
  Administered 2020-04-02: 50 ug via INTRAVENOUS

## 2020-04-02 MED ORDER — ACETAMINOPHEN 325 MG PO TABS
650.0000 mg | ORAL_TABLET | ORAL | Status: DC | PRN
  Administered 2020-04-02 – 2020-04-06 (×2): 650 mg via ORAL
  Filled 2020-04-02 (×2): qty 2

## 2020-04-02 MED ORDER — FENTANYL CITRATE (PF) 100 MCG/2ML IJ SOLN
INTRAMUSCULAR | Status: AC
  Filled 2020-04-02: qty 2

## 2020-04-02 MED ORDER — HEPARIN SODIUM (PORCINE) 1000 UNIT/ML IJ SOLN
INTRAMUSCULAR | Status: DC | PRN
  Administered 2020-04-02: 5000 [IU] via INTRAVENOUS

## 2020-04-02 MED ORDER — SODIUM CHLORIDE 0.9 % WEIGHT BASED INFUSION
1.0000 mL/kg/h | INTRAVENOUS | Status: AC
  Administered 2020-04-02: 1 mL/kg/h via INTRAVENOUS

## 2020-04-02 MED ORDER — LIDOCAINE HCL (PF) 1 % IJ SOLN
INTRAMUSCULAR | Status: AC
  Filled 2020-04-02: qty 30

## 2020-04-02 SURGICAL SUPPLY — 11 items
CATH 5F 110X4 TIG (CATHETERS) ×1 IMPLANT
CATH INFINITI 5 FR JL3.5 (CATHETERS) ×1 IMPLANT
CATH INFINITI 5FR ANG PIGTAIL (CATHETERS) ×1 IMPLANT
DEVICE RAD TR BAND REGULAR (VASCULAR PRODUCTS) ×2 IMPLANT
GLIDESHEATH SLEND SS 6F .021 (SHEATH) ×1 IMPLANT
GUIDEWIRE INQWIRE 1.5J.035X260 (WIRE) ×1 IMPLANT
INQWIRE 1.5J .035X260CM (WIRE) ×2
KIT SYRINGE INJ CVI SPIKEX1 (MISCELLANEOUS) ×1 IMPLANT
PACK CARDIAC CATH (CUSTOM PROCEDURE TRAY) ×2 IMPLANT
SET ATX SIMPLICITY (MISCELLANEOUS) ×1 IMPLANT
WIRE HITORQ VERSACORE ST 145CM (WIRE) ×1 IMPLANT

## 2020-04-02 NOTE — Progress Notes (Signed)
*  PRELIMINARY RESULTS* Echocardiogram 2D Echocardiogram has been performed.  Jeff Ward [name] 04/02/2020, 6:51 PM

## 2020-04-02 NOTE — Progress Notes (Addendum)
Pt BP at 171/91 MAP 113 and HR 113. Ouma NP made aware. Will continue to monitor.  Update 0558: Talked to [facility] but no new order was place. Will continue to monitor.  Update 0645: Give report to [employer] ([employer]).

## 2020-04-02 NOTE — Consult Note (Signed)
[name], MD [address]  [place]  [place], [place] [address]  Main: [phone]  Fax: [phone] Pager: [phone]   Consultation  Referring Provider:     No ref. provider found Primary Care Physician:  Adin Hector, MD Primary Gastroenterologist:  Dr. Lucio Edward         Reason for Consultation:     Decompensated cirrhosis, ascites  Date of Admission:  04/01/2020 Date of Consultation:  04/02/2020         HPI:   Jeff Ward is a 70 y.o. male with history of alcoholic decompensated cirrhosis with ascites, hepatic encephalopathy, small esophageal varices status post variceal ligation in 09/2018.  Patient presents with worsening of abdominal distention, shortness of breath, worsening of bilateral lower extremity swelling, palpitations.  He was initially evaluated for NSTEMI secondary to elevated troponin levels.  He underwent cardiac catheterization today, found to have clean coronaries.  Subsequently, GI is consulted for management of ascites.  Patient underwent therapeutic paracentesis and 7 L of straw-colored fluid removed.  Patient is hemodynamically stable, afebrile.  Denies any rectal bleeding, melena, hematemesis, nausea.  His labs revealed stable hemoglobin at 10, LFTs are stable, at baseline, preserved renal function  Patient's aunt and sister were at bedside.  He denies alcohol use  NSAIDs: None  Antiplts/Anticoagulants/Anti thrombotics: None  GI Procedures: Patient underwent several upper endoscopies in the past.  Found to have esophageal varices, underwent ligation Colonoscopy in 2015 revealed moderate size internal hemorrhoids, mild sigmoid diverticulosis,  Past Medical History:  Diagnosis Date  . Allergy    SEASONAL  . Anemia   . Anxiety   . Arthritis    In back and all joints  . Asthma   . Chronic headache    now resolved  . CKD (chronic kidney disease)   . 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 Depression   .  Esophageal varices ([place])   . GERD (gastroesophageal reflux disease)   . GI bleed   . Hiatal hernia   . History of kidney stones 1970's  . Hyperlipidemia 06/2002  . Hypertension   . Liver disease   . Psoriasis   . Stroke ([place])    PT. STATED MILD  . Substance abuse ([place])    H/O ETOH    Past Surgical History:  Procedure Laterality Date  . CARPAL TUNNEL RELEASE  2005    / right hand  . COLONOSCOPY     march 2016  . ESOPHAGEAL BANDING  09/22/2017   Procedure: ESOPHAGEAL BANDING;  Surgeon: Ladene Artist, MD;  Location: [facility];  Service: Endoscopy;;  . ESOPHAGEAL BANDING  10/11/2018   Procedure: ESOPHAGEAL BANDING;  Surgeon: Ladene Artist, MD;  Location: [facility];  Service: Endoscopy;;  . ESOPHAGOGASTRODUODENOSCOPY N/A 07/24/2015   Procedure: ESOPHAGOGASTRODUODENOSCOPY (EGD);  Surgeon: Lucilla Lame, MD;  Location: [facility] ENDOSCOPY;  Service: Endoscopy;  Laterality: N/A;  Banding  . ESOPHAGOGASTRODUODENOSCOPY (EGD) WITH PROPOFOL N/A 08/14/2015   Procedure: ESOPHAGOGASTRODUODENOSCOPY (EGD) WITH PROPOFOL;  Surgeon: Ladene Artist, MD;  Location: [facility] ENDOSCOPY;  Service: Endoscopy;  Laterality: N/A;  . ESOPHAGOGASTRODUODENOSCOPY (EGD) WITH PROPOFOL N/A 09/22/2017   Procedure: ESOPHAGOGASTRODUODENOSCOPY (EGD) WITH PROPOFOL;  Surgeon: Ladene Artist, MD;  Location: [facility];  Service: Endoscopy;  Laterality: N/A;  . ESOPHAGOGASTRODUODENOSCOPY (EGD) WITH PROPOFOL N/A 10/11/2018   Procedure: ESOPHAGOGASTRODUODENOSCOPY (EGD) WITH PROPOFOL;  Surgeon: Ladene Artist, MD;  Location: [facility] ENDOSCOPY;  Service: Endoscopy;  Laterality: N/A;  .  LEFT HEART CATH N/A 04/02/2020   Procedure: Left Heart Cath and Coronary Angiography;  Surgeon: Isaias Cowman, MD;  Location: [facility];  Service: Cardiovascular;  Laterality: N/A;  . TONSILLECTOMY AND ADENOIDECTOMY     70 years old  . VASECTOMY      Prior to Admission medications   Medication Sig Start Date End Date Taking?  Authorizing Provider  amLODipine (NORVASC) 5 MG tablet Take 5 mg by mouth daily. 03/21/20  Yes [provider]  atorvastatin (LIPITOR) 40 MG tablet Take 40 mg by mouth daily. 12/28/19  Yes [provider]  busPIRone (BUSPAR) 7.5 MG tablet Take 2 tablets by mouth in the morning and at bedtime. 12/27/19  Yes [provider]  furosemide (LASIX) 40 MG tablet Take 40 mg by mouth daily. 03/29/20  Yes [provider]  hydrOXYzine (ATARAX/VISTARIL) 10 MG tablet Take 10 mg by mouth 3 (three) times daily as needed for itching. 03/27/20  Yes [provider]  Lactulose 20 GM/30ML SOLN Take 20 mLs (13.3333 g total) by mouth in the morning and at bedtime. 03/07/20 04/06/20 Yes Ladene Artist, MD  losartan (COZAAR) 25 MG tablet Take 25 mg by mouth daily. 03/27/20  Yes [provider]  pantoprazole (PROTONIX) 40 MG tablet Take 1 tablet (40 mg total) by mouth daily. 03/05/16  Yes Ladene Artist, MD  propranolol (INDERAL) 10 MG tablet Take 10 mg by mouth 2 (two) times daily. 03/04/20  Yes [provider]  sertraline (ZOLOFT) 25 MG tablet Take 25 mg by mouth daily. 02/23/20  Yes [provider]  spironolactone (ALDACTONE) 50 MG tablet Take 50 mg by mouth daily. 03/03/20  Yes [provider]  rifaximin (XIFAXAN) 550 MG TABS tablet Take 1 tablet (550 mg total) by mouth 2 (two) times daily. Patient not taking: No sig reported 03/01/20   Wouk, Ailene Rud, MD  tiZANidine (ZANAFLEX) 2 MG tablet Take 2 mg by mouth 3 (three) times daily. 12/19/19   [provider]  VENTOLIN HFA 108 (90 Base) MCG/ACT inhaler Inhale 2 puffs into the lungs every 6 (six) hours as needed for wheezing or shortness of breath.  07/28/16   [provider]   Current Facility-Administered Medications:  .  0.9 %  sodium chloride infusion, 250 mL, Intravenous, PRN, Paraschos, Alexander, MD .  0.9% sodium chloride infusion, 1 mL/kg/hr, Intravenous, Continuous, Paraschos,  Alexander, MD, Last Rate: 95.8 mL/hr at 04/02/20 1041, 1 mL/kg/hr at 04/02/20 1041 .  acetaminophen (TYLENOL) tablet 650 mg, 650 mg, Oral, Q4H PRN, Agbata, Tochukwu, MD, 650 mg at 04/02/20 0254 .  acetaminophen (TYLENOL) tablet 650 mg, 650 mg, Oral, Q4H PRN, Paraschos, Alexander, MD .  albumin human 25 % solution 50 g, 50 g, Intravenous, Once, Ghimire, Kuber, MD .  albuterol (VENTOLIN HFA) 108 (90 Base) MCG/ACT inhaler 2 puff, 2 puff, Inhalation, Q6H PRN, Agbata, Tochukwu, MD .  aspirin EC tablet 81 mg, 81 mg, Oral, Daily, Agbata, Tochukwu, MD, 81 mg at 04/02/20 1202 .  atorvastatin (LIPITOR) tablet 20 mg, 20 mg, Oral, Daily, Agbata, Tochukwu, MD, 20 mg at 04/02/20 1203 .  busPIRone (BUSPAR) tablet 7.5 mg, 7.5 mg, Oral, BID, Agbata, Tochukwu, MD, 7.5 mg at 04/02/20 1205 .  furosemide (LASIX) tablet 40 mg, 40 mg, Oral, Daily, Ghimire, Kuber, MD, 40 mg at 04/02/20 1159 .  lactulose (CHRONULAC) 10 GM/15ML solution 13.3333 g, 13.3333 g, Oral, BID, Agbata, Tochukwu, MD, 13.3333 g at 04/02/20 1202 .  nitroGLYCERIN (NITROSTAT) SL tablet 0.4  mg, 0.4 mg, Sublingual, Q5 Min x 3 PRN, Agbata, Tochukwu, MD .  pantoprazole (PROTONIX) injection 40 mg, 40 mg, Intravenous, Q12H, Ghimire, Kuber, MD, 40 mg at 04/02/20 1200 .  polyvinyl alcohol (LIQUIFILM TEARS) 1.4 % ophthalmic solution 1 drop, 1 drop, Both Eyes, PRN, Ghimire, Kuber, MD .  rifaximin (XIFAXAN) tablet 550 mg, 550 mg, Oral, BID, Agbata, Tochukwu, MD, 550 mg at 04/02/20 1202 .  sertraline (ZOLOFT) tablet 25 mg, 25 mg, Oral, Daily, Agbata, Tochukwu, MD, 25 mg at 04/02/20 1201 .  sodium chloride flush (NS) 0.9 % injection 3 mL, 3 mL, Intravenous, Q12H, Paraschos, Alexander, MD, 3 mL at 04/01/20 2217 .  sodium chloride flush (NS) 0.9 % injection 3 mL, 3 mL, Intravenous, Q12H, Paraschos, Alexander, MD .  sodium chloride flush (NS) 0.9 % injection 3 mL, 3 mL, Intravenous, PRN, Paraschos, Alexander, MD .  Derrill Memo ON 04/03/2020] spironolactone (ALDACTONE) tablet  100 mg, 100 mg, Oral, Daily, Vanga, Tally Due, MD   Family History  Problem Relation Age of Onset  . CAD Mother   . CVA Mother   . Heart disease Mother   . Melanoma Father   . CVA Father   . Liver disease Brother   . Breast cancer Paternal Aunt   . Alcohol abuse Maternal Grandfather      Social History   Tobacco Use  . Smoking status: Former Smoker    Years: 35.00    Types: Cigars    Quit date: 04/02/2010    Years since quitting: 10.0  . Smokeless tobacco: Never Used  . Tobacco comment: Quit 4 years ago  Vaping Use  . Vaping Use: Never used  Substance Use Topics  . Alcohol use: No    Alcohol/week: 2.0 standard drinks    Types: 2 Cans of beer per week    Comment: Used to be a heavy drinker, quit for 5 months now  . Drug use: No    Allergies as of 04/01/2020 - Review Complete 04/01/2020  Allergen Reaction Noted  . Bee venom Anaphylaxis 07/23/2015  . Sulfa antibiotics Other (See Comments) 07/23/2015  . Duloxetine Nausea Only 08/05/2019    Review of Systems:    All systems reviewed and negative except where noted in HPI.   Physical Exam:  Vital signs in last 24 hours: Temp:  [98.4 F (36.9 C)-98.8 F (37.1 C)] 98.4 F (36.9 C) (03/07 1142) Pulse Rate:  [83-121] 83 (03/07 1450) Resp:  [12-23] 18 (03/07 1142) BP: (129-187)/(64-109) 165/85 (03/07 1517) SpO2:  [93 %-99 %] 99 % (03/07 1517) Weight:  [95.8 kg] 95.8 kg (03/06 2022) Last BM Date: 04/02/20 General:   Pleasant, cooperative in NAD Head:  Normocephalic and atraumatic. Eyes:   No icterus.   Conjunctiva pink. PERRLA. Ears:  Normal auditory acuity. Neck:  Supple; no masses or thyroidomegaly Lungs: Respirations even and unlabored. Lungs clear to auscultation bilaterally.   No wheezes, crackles, or rhonchi.  Heart:  Regular rate and rhythm;  Without murmur, clicks, rubs or gallops Abdomen:  Soft, distended, nontender. Normal bowel sounds. No appreciable masses or hepatomegaly.  No rebound or guarding.   Rectal:  Not performed. Msk:  Symmetrical without gross deformities.  Strength normal Extremities:  Without edema, cyanosis or clubbing. Neurologic:  Alert and oriented x3;  grossly normal neurologically. Skin:  Intact without significant lesions or rashes. Psych:  Alert and cooperative. Normal affect.  LAB RESULTS: CBC Latest Ref Rng & Units 04/02/2020 04/01/2020 03/01/2020  WBC 4.0 - 10.5 K/uL 8.5 7.7  4.3  Hemoglobin 13.0 - 17.0 g/dL 10.1(L) 10.3(L) 9.5(L)  Hematocrit 39.0 - 52.0 % 29.9(L) 29.9(L) 28.9(L)  Platelets 150 - 400 K/uL 141(L) 141(L) 77(L)    BMET BMP Latest Ref Rng & Units 04/02/2020 04/01/2020 03/01/2020  Glucose 70 - 99 mg/dL 109(H) 124(H) 127(H)  BUN 8 - 23 mg/dL 15 14 10   Creatinine 0.61 - 1.24 mg/dL 1.00 0.94 1.00  BUN/Creat Ratio 10 - 22 - - -  Sodium 135 - 145 mmol/L 140 140 137  Potassium 3.5 - 5.1 mmol/L 3.8 3.8 3.9  Chloride 98 - 111 mmol/L 108 106 111  CO2 22 - 32 mmol/L 24 23 20(L)  Calcium 8.9 - 10.3 mg/dL 8.7(L) 9.0 7.5(L)    LFT Hepatic Function Latest Ref Rng & Units 03/01/2020 02/29/2020 05/14/2017  Total Protein 6.5 - 8.1 g/dL 4.9(L) 4.7(L) 7.5  Albumin 3.5 - 5.0 g/dL 2.3(L) 2.3(L) 4.1  AST 15 - 41 U/L 52(H) 54(H) 33  ALT 0 - 44 U/L 31 31 23   Alk Phosphatase 38 - 126 U/L 86 87 42  Total Bilirubin 0.3 - 1.2 mg/dL 1.8(H) 1.8(H) 1.9(H)  Bilirubin, Direct 0.0 - 0.3 mg/dL - - -     STUDIES: DG Chest 2 View  Result Date: 04/01/2020 CLINICAL DATA:  Shortness of breath. EXAM: CHEST - 2 VIEW COMPARISON:  July 24, 2015 FINDINGS: Streaky opacity in the right base, likely scar. Blunting of the costophrenic angles on the lateral view may represent tiny effusions. The heart, hila, mediastinum, lungs, and pleura are otherwise normal. No overt edema. IMPRESSION: 1. Subsegmental atelectasis or scar in the right base. 2. Probable tiny effusions. 3. No other abnormalities. Electronically Signed   By: Dorise Bullion III M.D   On: 04/01/2020 07:07   CT Angio Chest PE W and/or  Wo Contrast  Result Date: 04/01/2020 CLINICAL DATA:  70 year old male with shortness of breath, history of recent COVID infection. EXAM: CT ANGIOGRAPHY CHEST WITH CONTRAST TECHNIQUE: Multidetector CT imaging of the chest was performed using the standard protocol during bolus administration of intravenous contrast. Multiplanar CT image reconstructions and MIPs were obtained to evaluate the vascular anatomy. CONTRAST:  Seventy-five mL Omnipaque 350, intravenous COMPARISON:  CT abdomen pelvis from 07/21/2018 FINDINGS: Cardiovascular: Satisfactory opacification of the pulmonary arteries to the segmental level. No evidence of pulmonary embolism. Normal heart size. No pericardial effusion. Moderate global cardiomegaly. Moderate coronary atherosclerotic calcifications. Aortic valvular calcifications are noted. Atherosclerotic calcifications of the thoracic aorta which is patent and normal in caliber. No pericardial effusion. Mediastinum/Nodes: No enlarged mediastinal, hilar, or axillary lymph nodes. Thyroid gland, trachea, and esophagus demonstrate no significant findings. Lungs/Pleura: Trace right greater than left bilateral pleural effusions. Mild associated passive atelectasis in the dependent lung bases. Mild linear atelectasis in the lingula. No focal consolidations, suspicious pulmonary nodules, or pneumothorax. Upper Abdomen: The liver is shrunken and nodular in contour. There is large volume bilateral upper quadrant ascites. Musculoskeletal: Bilateral symmetric gynecomastia. No acute or significant osseous findings. Review of the MIP images confirms the above findings. IMPRESSION: Vascular: 1. No evidence of pulmonary embolism. 2. Coronary and aortic atherosclerosis (ICD10-I70.0). 3. Moderate global cardiomegaly. Non-Vascular: 1. Trace bilateral pleural effusions, right greater than left. 2. Hepatic cirrhosis. 3. Large volume upper abdominal ascites. 4. Bilateral symmetric gynecomastia. Ruthann Cancer, MD [facility] Electronically Signed   By: Ruthann Cancer MD   On: 04/01/2020 09:57   CARDIAC CATHETERIZATION  Result Date: 04/02/2020 1. Normal coronary anatomy 2. Normal left  ventricular function Recommendations 1. Medical therapy 2. Aggressive risk factor modification  US Paracentesis  Result Date: 04/02/2020 INDICATION: Patient with history of alcoholic cirrhosis with large volume ascites. Request is for therapeutic and diagnostic paracentesis. EXAM: ULTRASOUND GUIDED THERAPEUTIC AND DIAGNOSTIC PARACENTESIS MEDICATIONS: Lidocaine 1% 10 mL COMPLICATIONS: None immediate. PROCEDURE: Informed written consent was obtained from the patient after a discussion of the risks, benefits and alternatives to treatment. A timeout was performed prior to the initiation of the procedure. Initial ultrasound scanning demonstrates a large amount of ascites within the right lower abdominal quadrant. The right lower abdomen was prepped and draped in the usual sterile fashion. 1% lidocaine was used for local anesthesia. Following this, a 19 gauge, 7-cm, Yueh catheter was introduced. An ultrasound image was saved for documentation purposes. The paracentesis was performed. The catheter was removed and a dressing was applied. The patient tolerated the procedure well without immediate post procedural complication. Patient received post-procedure intravenous albumin; see nursing notes for details. FINDINGS: A total of approximately 7 L of straw-colored fluid was removed. Samples were sent to the laboratory as requested by the clinical team. IMPRESSION: Successful ultrasound-guided therapeutic and diagnostic paracentesis yielding 7 liters of peritoneal fluid. Read by: Rushie Nyhan, NP Electronically Signed   By: Sandi Mariscal M.D.   On: 04/02/2020 15:35      Impression / Plan:   Jeff Ward is a 70 y.o. male with history of decompensated alcoholic cirrhosis with ascites, hepatic  encephalopathy, known history of esophageal varices s/p ligation presented with volume overload with ascites and swelling of legs as well as shortness of breath.  Patient was evaluated for NSTEMI, cardiac catheterization revealed normal coronaries  Decompensated cirrhosis of liver with ascites/volume overload Status post therapeutic paracentesis, 7 L of fluid removed 50 g of albumin administered post paracentesis Fluid analysis did not reveal SBP, awaiting rest of the fluid analysis Continue strict low-sodium diet Continue Lasix 40 mg and increase spironolactone 100 mg daily.  Patient may need up titration of diuretics Monitor renal function and electrolytes closely while on diuretics Continue lactulose and rifaximin for hepatic encephalopathy Recommend ultrasound liver Dopplers to evaluate for any portal vein thrombosis   Thank you for involving me in the care of this patient.  GI will follow along with you    LOS: 1 day   Sherri Sear, MD  04/02/2020, 5:21 PM   Note: This dictation was prepared with Dragon dictation along with smaller phrase technology. Any transcriptional errors that result from this process are unintentional.

## 2020-04-02 NOTE — Progress Notes (Signed)
PROGRESS NOTE    Jeff Ward  WEX:[id number] DOB: August 03, 1950 DOA: 04/01/2020 PCP: Adin Hector, MD    Brief Narrative:  Patient is 70 year old gentleman with history of alcoholic liver cirrhosis, GERD, dyslipidemia, depression presented to the ER with shortness of breath and abdominal distention.  He does have liver cirrhosis and had difficulty tolerating fluid control recently.  In the emergency room, he was found with elevated troponins 1335-1153.  CT angiogram with no evidence of PE.  Patient was admitted with suspected non-STEMI, however found to have normal coronaries.   Assessment & Plan:   Principal Problem:   NSTEMI (non-ST elevated myocardial infarction) ([place]) Active Problems:   Rectal bleeding   Depression   GERD (gastroesophageal reflux disease)   Alcoholic cirrhosis of liver with ascites ([place])  Suspected non-ST elevation MI: Ruled out.  Cardiac cath with normal coronaries.  His symptoms are mostly related to decompensated liver failure and ascites. Continue aspirin.  Statin continued, recent LFTs were normal.  We will recheck tomorrow morning.  Decompensated alcoholic cirrhosis of liver with tense ascites: Multiple attempts to optimize diuretic therapy and difficulty tolerating.  Presents with anasarca and tense ascites. Paracentesis requested with ultrasound, will add albumin if the drain more than 4 L. LFT, INR in the morning.  Mental status is normal.  No evidence of fulminant hepatic failure. Resume Lasix 40 mg daily, Aldactone 50 mg daily.  Patient is already on rifaximin. Patient with known cirrhosis, currently without active bleeding.  Not tolerated beta-blocker in the past. Continue Protonix 40 mg twice a day today. I have requested inpatient gastroenterology consult.  Depression: Stable.  On home medications BuSpar and Zoloft.   DVT prophylaxis: SCDs   Code Status: Full code Family Communication: Patient's son on the phone Disposition Plan: Status  is: Inpatient  Remains inpatient appropriate because:IV treatments appropriate due to intensity of illness or inability to take PO and Inpatient level of care appropriate due to severity of illness   Dispo: The patient is from: Home              Anticipated d/c is to: Home              Patient currently is not medically stable to d/c.   Difficult to place patient No         Consultants:   Gastroenterology  Cardiology  Procedures:   Cardiac cath.  3/7  Antimicrobials:   Rifaximin, long-term   Subjective: Patient seen and examined.  He was at recovery from cardiac cath.  Denies any chest pain.  Feels bloated.  No other events.  Objective: Vitals:   04/02/20 0945 04/02/20 1000 04/02/20 1015 04/02/20 1030  BP: (!) 153/82 (!) 179/95  (!) 162/75  Pulse: (!) 118 (!) 117 (!) 117 (!) 118  Resp: (!) 22 19 18 13   Temp:      TempSrc:      SpO2: 97% 97% 96% 97%  Weight:      Height:        Intake/Output Summary (Last 24 hours) at 04/02/2020 1125 Last data filed at 04/01/2020 2343 Gross per 24 hour  Intake 35.09 ml  Output 150 ml  Net -114.91 ml   Filed Weights   04/01/20 2022  Weight: 95.8 kg    Examination:  General exam: Appears calm and comfortable Chronically sick looking but not in any distress. Respiratory system: Clear to auscultation.  Some basal crackles present.  Mostly clear. Cardiovascular system: S1 & S2 heard,  RRR. No JVD, murmurs, rubs, gallops or clicks.  2+ pedal edema. Gastrointestinal system: Abdomen is distended but nontender.  Tense ascites present.  Bowel sounds present. Central nervous system: Alert and oriented. No focal neurological deficits. Extremities: Symmetric 5 x 5 power. Skin: No rashes, lesions or ulcers Psychiatry: Judgement and insight appear normal. Mood & affect appropriate.     Data Reviewed: I have personally reviewed following labs and imaging studies  CBC: Recent Labs  Lab 04/01/20 0808 04/02/20 0520  WBC 7.7 8.5   HGB 10.3* 10.1*  HCT 29.9* 29.9*  MCV 95.2 95.2  PLT 141* 161*   Basic Metabolic Panel: Recent Labs  Lab 04/01/20 0808 04/02/20 0520  NA 140 140  K 3.8 3.8  CL 106 108  CO2 23 24  GLUCOSE 124* 109*  BUN 14 15  CREATININE 0.94 1.00  CALCIUM 9.0 8.7*   GFR: Estimated Creatinine Clearance: 73.1 mL/min (by C-G formula based on SCr of 1 mg/dL). Liver Function Tests: No results for input(s): AST, ALT, ALKPHOS, BILITOT, PROT, ALBUMIN in the last 168 hours. No results for input(s): LIPASE, AMYLASE in the last 168 hours. No results for input(s): AMMONIA in the last 168 hours. Coagulation Profile: No results for input(s): INR, PROTIME in the last 168 hours. Cardiac Enzymes: No results for input(s): CKTOTAL, CKMB, CKMBINDEX, TROPONINI in the last 168 hours. BNP (last 3 results) No results for input(s): PROBNP in the last 8760 hours. HbA1C: No results for input(s): HGBA1C in the last 72 hours. CBG: No results for input(s): GLUCAP in the last 168 hours. Lipid Profile: Recent Labs    04/02/20 0520  CHOL 102  HDL 36*  LDLCALC 55  TRIG 57  CHOLHDL 2.8   Thyroid Function Tests: No results for input(s): TSH, T4TOTAL, FREET4, T3FREE, THYROIDAB in the last 72 hours. Anemia Panel: No results for input(s): VITAMINB12, FOLATE, FERRITIN, TIBC, IRON, RETICCTPCT in the last 72 hours. Sepsis Labs: No results for input(s): PROCALCITON, LATICACIDVEN in the last 168 hours.  No results found for this or any previous visit (from the past 240 hour(s)).       Radiology Studies: DG Chest 2 View  Result Date: 04/01/2020 CLINICAL DATA:  Shortness of breath. EXAM: CHEST - 2 VIEW COMPARISON:  July 24, 2015 FINDINGS: Streaky opacity in the right base, likely scar. Blunting of the costophrenic angles on the lateral view may represent tiny effusions. The heart, hila, mediastinum, lungs, and pleura are otherwise normal. No overt edema. IMPRESSION: 1. Subsegmental atelectasis or scar in the right  base. 2. Probable tiny effusions. 3. No other abnormalities. Electronically Signed   By: Dorise Bullion III M.D   On: 04/01/2020 07:07   CT Angio Chest PE W and/or Wo Contrast  Result Date: 04/01/2020 CLINICAL DATA:  70 year old male with shortness of breath, history of recent COVID infection. EXAM: CT ANGIOGRAPHY CHEST WITH CONTRAST TECHNIQUE: Multidetector CT imaging of the chest was performed using the standard protocol during bolus administration of intravenous contrast. Multiplanar CT image reconstructions and MIPs were obtained to evaluate the vascular anatomy. CONTRAST:  Seventy-five mL Omnipaque 350, intravenous COMPARISON:  CT abdomen pelvis from 07/21/2018 FINDINGS: Cardiovascular: Satisfactory opacification of the pulmonary arteries to the segmental level. No evidence of pulmonary embolism. Normal heart size. No pericardial effusion. Moderate global cardiomegaly. Moderate coronary atherosclerotic calcifications. Aortic valvular calcifications are noted. Atherosclerotic calcifications of the thoracic aorta which is patent and normal in caliber. No pericardial effusion. Mediastinum/Nodes: No enlarged mediastinal, hilar, or axillary lymph nodes. Thyroid  gland, trachea, and esophagus demonstrate no significant findings. Lungs/Pleura: Trace right greater than left bilateral pleural effusions. Mild associated passive atelectasis in the dependent lung bases. Mild linear atelectasis in the lingula. No focal consolidations, suspicious pulmonary nodules, or pneumothorax. Upper Abdomen: The liver is shrunken and nodular in contour. There is large volume bilateral upper quadrant ascites. Musculoskeletal: Bilateral symmetric gynecomastia. No acute or significant osseous findings. Review of the MIP images confirms the above findings. IMPRESSION: Vascular: 1. No evidence of pulmonary embolism. 2. Coronary and aortic atherosclerosis (ICD10-I70.0). 3. Moderate global cardiomegaly. Non-Vascular: 1. Trace bilateral  pleural effusions, right greater than left. 2. Hepatic cirrhosis. 3. Large volume upper abdominal ascites. 4. Bilateral symmetric gynecomastia. Ruthann Cancer, MD [facility] Radiology Electronically Signed   By: Ruthann Cancer MD   On: 04/01/2020 09:57   CARDIAC CATHETERIZATION  Result Date: 04/02/2020 1. Normal coronary anatomy 2. Normal left ventricular function Recommendations 1. Medical therapy 2. Aggressive risk factor modification       Scheduled Meds: . aspirin EC  81 mg Oral Daily  . atorvastatin  20 mg Oral Daily  . busPIRone  7.5 mg Oral BID  . furosemide  40 mg Oral Daily  . lactulose  13.3333 g Oral BID  . pantoprazole  40 mg Intravenous Q12H  . rifaximin  550 mg Oral BID  . sertraline  25 mg Oral Daily  . sodium chloride flush  3 mL Intravenous Q12H  . sodium chloride flush  3 mL Intravenous Q12H  . spironolactone  50 mg Oral Daily   Continuous Infusions: . sodium chloride    . sodium chloride 1 mL/kg/hr (04/02/20 1041)     LOS: 1 day    Time spent: 35 minutes    Barb Merino, MD [facility] Pager [phone]

## 2020-04-02 NOTE — Procedures (Signed)
Ultrasound-guided diagnostic and therapeutic paracentesis performed yielding 7 liters of straw colored fluid.  Fluid was sent to lab for analysis. No immediate complications. EBL is none.

## 2020-04-03 ENCOUNTER — Ambulatory Visit (HOSPITAL_COMMUNITY): Admission: RE | Admit: 2020-04-03 | Payer: Medicare HMO | Source: Ambulatory Visit

## 2020-04-03 ENCOUNTER — Ambulatory Visit: Payer: Medicare HMO | Admitting: Gastroenterology

## 2020-04-03 ENCOUNTER — Inpatient Hospital Stay: Payer: Medicare HMO

## 2020-04-03 DIAGNOSIS — I214 Non-ST elevation (NSTEMI) myocardial infarction: Secondary | ICD-10-CM | POA: Diagnosis not present

## 2020-04-03 DIAGNOSIS — K7031 Alcoholic cirrhosis of liver with ascites: Secondary | ICD-10-CM | POA: Diagnosis not present

## 2020-04-03 LAB — CBC WITH DIFFERENTIAL/PLATELET
Abs Immature Granulocytes: 0.01 10*3/uL (ref 0.00–0.07)
Basophils Absolute: 0.1 10*3/uL (ref 0.0–0.1)
Basophils Relative: 1 %
Eosinophils Absolute: 0.3 10*3/uL (ref 0.0–0.5)
Eosinophils Relative: 7 %
HCT: 29.4 % — ABNORMAL LOW (ref 39.0–52.0)
Hemoglobin: 10 g/dL — ABNORMAL LOW (ref 13.0–17.0)
Immature Granulocytes: 0 %
Lymphocytes Relative: 21 %
Lymphs Abs: 1 10*3/uL (ref 0.7–4.0)
MCH: 32.4 pg (ref 26.0–34.0)
MCHC: 34 g/dL (ref 30.0–36.0)
MCV: 95.1 fL (ref 80.0–100.0)
Monocytes Absolute: 0.8 10*3/uL (ref 0.1–1.0)
Monocytes Relative: 17 %
Neutro Abs: 2.5 10*3/uL (ref 1.7–7.7)
Neutrophils Relative %: 54 %
Platelets: 120 10*3/uL — ABNORMAL LOW (ref 150–400)
RBC: 3.09 MIL/uL — ABNORMAL LOW (ref 4.22–5.81)
RDW: 15.2 % (ref 11.5–15.5)
WBC: 4.6 10*3/uL (ref 4.0–10.5)
nRBC: 0 % (ref 0.0–0.2)

## 2020-04-03 LAB — ECHOCARDIOGRAM COMPLETE
AR max vel: 2.78 cm2
AV Peak grad: 7.5 mmHg
Ao pk vel: 1.37 m/s
Area-P 1/2: 3.93 cm2
Height: 65 in
S' Lateral: 3.67 cm
Weight: 3377.6 oz

## 2020-04-03 LAB — COMPREHENSIVE METABOLIC PANEL
ALT: 26 U/L (ref 0–44)
AST: 49 U/L — ABNORMAL HIGH (ref 15–41)
Albumin: 2.9 g/dL — ABNORMAL LOW (ref 3.5–5.0)
Alkaline Phosphatase: 82 U/L (ref 38–126)
Anion gap: 7 (ref 5–15)
BUN: 14 mg/dL (ref 8–23)
CO2: 24 mmol/L (ref 22–32)
Calcium: 8.4 mg/dL — ABNORMAL LOW (ref 8.9–10.3)
Chloride: 109 mmol/L (ref 98–111)
Creatinine, Ser: 1.12 mg/dL (ref 0.61–1.24)
GFR, Estimated: >60 mL/min (ref >60)
Glucose, Bld: 103 mg/dL — ABNORMAL HIGH (ref 70–99)
Potassium: 3.5 mmol/L (ref 3.5–5.1)
Sodium: 140 mmol/L (ref 135–145)
Total Bilirubin: 2.1 mg/dL — ABNORMAL HIGH (ref 0.3–1.2)
Total Protein: 6.3 g/dL — ABNORMAL LOW (ref 6.5–8.1)

## 2020-04-03 LAB — PHOSPHORUS: Phosphorus: 3.3 mg/dL (ref 2.5–4.6)

## 2020-04-03 LAB — PROTIME-INR
INR: 1.6 — ABNORMAL HIGH (ref 0.8–1.2)
Prothrombin Time: 18.2 seconds — ABNORMAL HIGH (ref 11.4–15.2)

## 2020-04-03 LAB — MAGNESIUM: Magnesium: 2 mg/dL (ref 1.7–2.4)

## 2020-04-03 MED ORDER — AMLODIPINE BESYLATE 5 MG PO TABS
5.0000 mg | ORAL_TABLET | Freq: Every day | ORAL | Status: DC
  Administered 2020-04-03 – 2020-04-06 (×4): 5 mg via ORAL
  Filled 2020-04-03 (×4): qty 1

## 2020-04-03 MED ORDER — FUROSEMIDE 10 MG/ML IJ SOLN
40.0000 mg | Freq: Once | INTRAMUSCULAR | Status: AC
  Administered 2020-04-03: 40 mg via INTRAVENOUS
  Filled 2020-04-03: qty 4

## 2020-04-03 MED ORDER — HYDROXYZINE HCL 10 MG PO TABS
10.0000 mg | ORAL_TABLET | Freq: Three times a day (TID) | ORAL | Status: DC | PRN
  Filled 2020-04-03: qty 1

## 2020-04-03 MED ORDER — SPIRONOLACTONE 25 MG PO TABS
150.0000 mg | ORAL_TABLET | Freq: Every day | ORAL | Status: DC
  Administered 2020-04-04: 150 mg via ORAL
  Filled 2020-04-03: qty 6

## 2020-04-03 MED ORDER — PROPRANOLOL HCL 20 MG PO TABS
10.0000 mg | ORAL_TABLET | Freq: Two times a day (BID) | ORAL | Status: DC
  Administered 2020-04-03 – 2020-04-06 (×7): 10 mg via ORAL
  Filled 2020-04-03 (×9): qty 1

## 2020-04-03 NOTE — Progress Notes (Signed)
[name], MD [address]  [place]  [place], [place] [address]  Main: [phone]  Fax: [phone] Pager: [phone]   Subjective: Patient is sitting up in chair and eating his lunch.  He reports feeling better with regards to his abdominal discomfort since removal of the fluid.  He does report significant swelling in his legs   Objective: Vital signs in last 24 hours: Vitals:   04/03/20 0458 04/03/20 0900 04/03/20 1131 04/03/20 1146  BP: (!) 160/89 (!) 161/87 135/67 127/72  Pulse: 100 (!) 107 77 74  Resp: 16 17 18 18   Temp: 99 F (37.2 C) 98.7 F (37.1 C) 98.7 F (37.1 C) 98.8 F (37.1 C)  TempSrc: Oral Oral Oral Oral  SpO2: 100% 100% 100% 100%  Weight:      Height:       Weight change:   Intake/Output Summary (Last 24 hours) at 04/03/2020 1426 Last data filed at 04/03/2020 1330 Gross per 24 hour  Intake 1585.42 ml  Output 2 ml  Net 1583.42 ml     Exam: Heart:: Regular rate and rhythm, S1S2 present or without murmur or extra heart sounds Lungs: normal and clear to auscultation Abdomen: Soft, moderately distended, nontender   Lab Results: CBC Latest Ref Rng & Units 04/03/2020 04/02/2020 04/01/2020  WBC 4.0 - 10.5 K/uL 4.6 8.5 7.7  Hemoglobin 13.0 - 17.0 g/dL 10.0(L) 10.1(L) 10.3(L)  Hematocrit 39.0 - 52.0 % 29.4(L) 29.9(L) 29.9(L)  Platelets 150 - 400 K/uL 120(L) 141(L) 141(L)   CMP Latest Ref Rng & Units 04/03/2020 04/02/2020 04/01/2020  Glucose 70 - 99 mg/dL 103(H) 109(H) 124(H)  BUN 8 - 23 mg/dL 14 15 14   Creatinine 0.61 - 1.24 mg/dL 1.12 1.00 0.94  Sodium 135 - 145 mmol/L 140 140 140  Potassium 3.5 - 5.1 mmol/L 3.5 3.8 3.8  Chloride 98 - 111 mmol/L 109 108 106  CO2 22 - 32 mmol/L 24 24 23   Calcium 8.9 - 10.3 mg/dL 8.4(L) 8.7(L) 9.0  Total Protein 6.5 - 8.1 g/dL 6.3(L) - -  Total Bilirubin 0.3 - 1.2 mg/dL 2.1(H) - -  Alkaline Phos 38 - 126 U/L 82 - -  AST 15 - 41 U/L 49(H) - -  ALT 0 - 44 U/L 26 - -    Micro Results: Recent Results  (from the past 240 hour(s))  Aerobic/Anaerobic Culture w Gram Stain (surgical/deep wound)     Status: None (Preliminary result)   Collection Time: 04/02/20  2:45 PM   Specimen: PATH Cytology Peritoneal fluid  Result Value Ref Range Status   Specimen Description   Final    PERITONEAL Performed at [facility], [address] [place], [place] [address]    Special Requests   Final    NONE Performed at [facility], [place], [place] [address]    Gram Stain NO WBC SEEN NO ORGANISMS SEEN   Final   Culture   Final    NO GROWTH < 24 HOURS Performed at [facility], [place] [address] [place], [place] [address]    Report Status PENDING  Incomplete   Studies/Results: CARDIAC CATHETERIZATION  Result Date: 04/02/2020 1. Normal coronary anatomy 2. Normal left ventricular function Recommendations 1. Medical therapy 2. Aggressive risk factor modification  US Paracentesis  Result Date: 04/02/2020 INDICATION: Patient with history of alcoholic cirrhosis with large volume ascites. Request is for therapeutic and diagnostic paracentesis. EXAM: ULTRASOUND GUIDED THERAPEUTIC AND DIAGNOSTIC PARACENTESIS MEDICATIONS: Lidocaine 1%  10 mL COMPLICATIONS: None immediate. PROCEDURE: Informed written consent was obtained from the patient after a discussion of the risks, benefits and alternatives to treatment. A timeout was performed prior to the initiation of the procedure. Initial ultrasound scanning demonstrates a large amount of ascites within the right lower abdominal quadrant. The right lower abdomen was prepped and draped in the usual sterile fashion. 1% lidocaine was used for local anesthesia. Following this, a 19 gauge, 7-cm, Yueh catheter was introduced. An ultrasound image was saved for documentation purposes. The paracentesis was performed. The catheter was removed and a dressing was applied. The patient tolerated the procedure well without immediate post procedural  complication. Patient received post-procedure intravenous albumin; see nursing notes for details. FINDINGS: A total of approximately 7 L of straw-colored fluid was removed. Samples were sent to the laboratory as requested by the clinical team. IMPRESSION: Successful ultrasound-guided therapeutic and diagnostic paracentesis yielding 7 liters of peritoneal fluid. Read by: Rushie Nyhan, NP Electronically Signed   By: Sandi Mariscal M.D.   On: 04/02/2020 15:35   ECHOCARDIOGRAM COMPLETE  Result Date: 04/03/2020    ECHOCARDIOGRAM REPORT   Patient Name:   Humberto A Oak Date of Exam: 04/02/2020 Medical Rec #:  [id number]     Height:       65.0 in Accession #:    [id number]    Weight:       211.1 lb Date of Birth:  06-06-1950     BSA:          2.024 m Patient Age:    70 years      BP:           165/85 mmHg Patient Gender: M             HR:           83 bpm. Exam Location:  [facility] Procedure: 2D Echo, Cardiac Doppler and Color Doppler Indications:     NSTEMI I21.4  History:         Patient has no prior history of Echocardiogram examinations.                  Stroke; Risk Factors:Hypertension.  Sonographer:     Alyse Low Roar Referring Phys:  [id number] WYOVZCHY AGBATA Diagnosing Phys: Nelva Bush MD IMPRESSIONS  1. Left ventricular ejection fraction, by estimation, is 55 to 60%. The left ventricle has normal function. Left ventricular endocardial border not optimally defined to evaluate regional wall motion. There is mild left ventricular hypertrophy. Left ventricular diastolic parameters are consistent with Grade I diastolic dysfunction (impaired relaxation). Elevated left atrial pressure.  2. Right ventricular systolic function is normal. The right ventricular size is normal. There is normal pulmonary artery systolic pressure.  3. Left atrial size was mildly dilated.  4. The mitral valve is degenerative. Trivial mitral valve regurgitation. No evidence of mitral stenosis.  5. The aortic valve was not well visualized. Aortic  valve regurgitation is not visualized. No aortic stenosis is present. FINDINGS  Left Ventricle: Left ventricular ejection fraction, by estimation, is 55 to 60%. The left ventricle has normal function. Left ventricular endocardial border not optimally defined to evaluate regional wall motion. The left ventricular internal cavity size was normal in size. There is mild left ventricular hypertrophy. Left ventricular diastolic parameters are consistent with Grade I diastolic dysfunction (impaired relaxation). Elevated left atrial pressure. Right Ventricle: The right ventricular size is normal. No increase in right ventricular wall thickness. Right ventricular systolic function is normal. There is  normal pulmonary artery systolic pressure. Left Atrium: Left atrial size was mildly dilated. Right Atrium: Right atrial size was normal in size. Pericardium: The pericardium was not well visualized. Mitral Valve: The mitral valve is degenerative in appearance. There is mild calcification of the mitral valve leaflet(s). Mild mitral annular calcification. Trivial mitral valve regurgitation. No evidence of mitral valve stenosis. Tricuspid Valve: The tricuspid valve is not well visualized. Tricuspid valve regurgitation is trivial. Aortic Valve: The aortic valve was not well visualized. Aortic valve regurgitation is not visualized. No aortic stenosis is present. Aortic valve peak gradient measures 7.5 mmHg. Pulmonic Valve: The pulmonic valve was not well visualized. Pulmonic valve regurgitation is trivial. No evidence of pulmonic stenosis. Aorta: The aortic root is normal in size and structure. Pulmonary Artery: The pulmonary artery is not well seen. Venous: The inferior vena cava was not well visualized. IAS/Shunts: The interatrial septum was not well visualized.  LEFT VENTRICLE PLAX 2D LVIDd:         4.99 cm  Diastology LVIDs:         3.67 cm  LV e' medial:    5.22 cm/s LV PW:         1.22 cm  LV E/e' medial:  17.3 LV IVS:         1.32 cm  LV e' lateral:   7.40 cm/s LVOT diam:     2.10 cm  LV E/e' lateral: 12.2 LVOT Area:     3.46 cm  RIGHT VENTRICLE RV Mid diam:    2.81 cm RV S prime:     16.10 cm/s TAPSE (M-mode): 1.8 cm LEFT ATRIUM             Index       RIGHT ATRIUM           Index LA diam:        4.45 cm 2.20 cm/m  RA Area:     11.80 cm LA Vol (A2C):   51.4 ml 25.39 ml/m RA Volume:   20.20 ml  9.98 ml/m LA Vol (A4C):   57.6 ml 28.46 ml/m LA Biplane Vol: 55.2 ml 27.27 ml/m  AORTIC VALVE                PULMONIC VALVE AV Area (Vmax): 2.78 cm    PV Vmax:        1.11 m/s AV Vmax:        137.00 cm/s PV Peak grad:   4.9 mmHg AV Peak Grad:   7.5 mmHg    RVOT Peak grad: 3 mmHg LVOT Vmax:      110.00 cm/s  AORTA Ao Root diam: 3.00 cm MITRAL VALVE                TRICUSPID VALVE MV Area (PHT): 3.93 cm     TR Peak grad:   25.4 mmHg MV Decel Time: 193 msec     TR Vmax:        252.00 cm/s MV E velocity: 90.30 cm/s MV A velocity: 145.00 cm/s  SHUNTS MV E/A ratio:  0.62         Systemic Diam: 2.10 cm MV A Prime:    18.3 cm/s Nelva Bush MD Electronically signed by Nelva Bush MD Signature Date/Time: 04/03/2020/6:37:06 AM    Final    US LIVER DOPPLER  Result Date: 04/03/2020 CLINICAL DATA:  Alcoholic cirrhosis with ascites EXAM: DUPLEX ULTRASOUND OF LIVER TECHNIQUE: Color and duplex Doppler ultrasound was performed to evaluate the hepatic in-flow  and out-flow vessels. COMPARISON:  CT abdomen pelvis with contrast 07/21/2018 FINDINGS: Liver: Increased heterogeneity of the parenchymal echogenicity. Nodular hepatic contours consistent with cirrhosis. No focal lesion, mass or intrahepatic biliary ductal dilatation. Main Portal Vein size: 1.5 cm Portal Vein Velocities Main Prox:  No flow identified Main Mid: 22 cm/sec Main Dist:  18 cm/sec Right: 38 cm/sec Left: 41 cm/sec Hepatic Vein Velocities Right:  13 cm/sec Middle:  13 cm/sec Left:  32 cm/sec IVC: Present and patent with normal respiratory phasicity. Hepatic Artery Velocity:  121 cm/sec  Splenic Vein Velocity:  17 cm/sec Spleen: 7.9 cm x 12.7 cm x 5.3 cm with a total volume of 278 cm^3 (411 cm^3 is upper limit normal) Portal Vein Occlusion/Thrombus: Findings suspicious for occlusion of proximal portal vein. Splenic Vein Occlusion/Thrombus: No Ascites: Mild Varices: None IMPRESSION: 1. Cirrhotic liver morphology. No focal hepatic lesion. Mild ascites. 2. No flow detected in the proximal portal vein. Mid and distal portal vein is patent. Intrahepatic segment of the portal vein are patent. Findings may be artifact, however partial portal vein thrombosis is also included in the differential. Further evaluation with contrast enhanced abdominal MRI would be beneficial to determine degree of portal vein occlusion. Electronically Signed   By: Miachel Roux M.D.   On: 04/03/2020 10:27   Medications:  I have reviewed the patient's current medications. Prior to Admission:  Medications Prior to Admission  Medication Sig Dispense Refill Last Dose  . amLODipine (NORVASC) 5 MG tablet Take 5 mg by mouth daily.   03/31/2020 at 0900  . atorvastatin (LIPITOR) 40 MG tablet Take 40 mg by mouth daily.   03/31/2020 at 2100  . busPIRone (BUSPAR) 7.5 MG tablet Take 2 tablets by mouth in the morning and at bedtime.   03/31/2020 at 2100  . furosemide (LASIX) 40 MG tablet Take 40 mg by mouth daily.   03/31/2020 at 0900  . hydrOXYzine (ATARAX/VISTARIL) 10 MG tablet Take 10 mg by mouth 3 (three) times daily as needed for itching.   03/31/2020 at 2100  . Lactulose 20 GM/30ML SOLN Take 20 mLs (13.3333 g total) by mouth in the morning and at bedtime. 1200 mL 5 03/31/2020 at 2100  . losartan (COZAAR) 25 MG tablet Take 25 mg by mouth daily.   03/31/2020 at 0900  . pantoprazole (PROTONIX) 40 MG tablet Take 1 tablet (40 mg total) by mouth daily. 90 tablet 2 03/31/2020 at 0900  . propranolol (INDERAL) 10 MG tablet Take 10 mg by mouth 2 (two) times daily.   03/31/2020 at 2100  . sertraline (ZOLOFT) 25 MG tablet Take 25 mg by mouth daily.    03/31/2020 at 0900  . spironolactone (ALDACTONE) 50 MG tablet Take 50 mg by mouth daily.   03/31/2020 at 0900  . rifaximin (XIFAXAN) 550 MG TABS tablet Take 1 tablet (550 mg total) by mouth 2 (two) times daily. (Patient not taking: No sig reported) 180 tablet 3 Not Taking at Unknown time  . tiZANidine (ZANAFLEX) 2 MG tablet Take 2 mg by mouth 3 (three) times daily.   unknown at prn  . VENTOLIN HFA 108 (90 Base) MCG/ACT inhaler Inhale 2 puffs into the lungs every 6 (six) hours as needed for wheezing or shortness of breath.    unknown at prn   Scheduled: . amLODipine  5 mg Oral Daily  . aspirin EC  81 mg Oral Daily  . atorvastatin  20 mg Oral Daily  . busPIRone  7.5 mg Oral BID  . furosemide  40 mg Oral Daily  . lactulose  13.3333 g Oral BID  . pantoprazole  40 mg Intravenous Q12H  . propranolol  10 mg Oral BID  . rifaximin  550 mg Oral BID  . sertraline  25 mg Oral Daily  . sodium chloride flush  3 mL Intravenous Q12H  . sodium chloride flush  3 mL Intravenous Q12H  . [START ON 04/04/2020] spironolactone  150 mg Oral Daily   Continuous: . sodium chloride     TRR:NHAFBX chloride, acetaminophen, acetaminophen, albuterol, hydrOXYzine, nitroGLYCERIN, polyvinyl alcohol, sodium chloride flush Anti-infectives (From admission, onward)   Start     Dose/Rate Route Frequency Ordered Stop   04/01/20 1245  rifaximin (XIFAXAN) tablet 550 mg        550 mg Oral 2 times daily 04/01/20 1235       Scheduled Meds: . amLODipine  5 mg Oral Daily  . aspirin EC  81 mg Oral Daily  . atorvastatin  20 mg Oral Daily  . busPIRone  7.5 mg Oral BID  . furosemide  40 mg Oral Daily  . lactulose  13.3333 g Oral BID  . pantoprazole  40 mg Intravenous Q12H  . propranolol  10 mg Oral BID  . rifaximin  550 mg Oral BID  . sertraline  25 mg Oral Daily  . sodium chloride flush  3 mL Intravenous Q12H  . sodium chloride flush  3 mL Intravenous Q12H  . [START ON 04/04/2020] spironolactone  150 mg Oral Daily   Continuous  Infusions: . sodium chloride     PRN Meds:.sodium chloride, acetaminophen, acetaminophen, albuterol, hydrOXYzine, nitroGLYCERIN, polyvinyl alcohol, sodium chloride flush   Assessment: Principal Problem:   NSTEMI (non-ST elevated myocardial infarction) ([place]) Active Problems:   Rectal bleeding   Depression   GERD (gastroesophageal reflux disease)   Alcoholic cirrhosis of liver with ascites ([place])  Plan: Decompensated alcoholic cirrhosis with volume overload Volume overload presenting as ascites and swelling of legs Ultrasound Dopplers revealed possible proximal partial portal vein thrombosis, no need for anticoagulation at this time Status post therapeutic paracentesis, 7 L of fluid removed, no evidence of SBP Strict low-sodium diet, give additional 40 mg IV Lasix today, increase spironolactone to 150 mg, continue p.o. Lasix 40 mg daily Monitor renal function and electrolytes closely Continue lactulose and rifaximin   LOS: 2 days   Jamileth Putzier 04/03/2020, 2:26 PM

## 2020-04-03 NOTE — Progress Notes (Signed)
Mobility Specialist - Progress Note   04/03/20 1200  Mobility  Activity Transferred:  Bed to chair  Level of Assistance Independent  Assistive Device None  Distance Ambulated (ft) 5 ft  Mobility Response Tolerated well  Mobility performed by Mobility specialist  $Mobility charge 1 Mobility    Pre-mobility: 75 HR, 98% SpO2 Post-mobility: 75 HR, 98% SpO2   Pt was lying in bed upon arrival with family at bedside. Pt utilizing room air. Pt agreed to session. Tp denied pain, nausea, and fatigue. Pt is independent in all transfers this date, including ambulation. Pt states he occasionally uses a walking stick to ambulate when needed, but most of the time he does not use AD. Pt sat EOB and stood with no complaints of dizziness. Pt ambulated 5' to recliner with no LOB noted. No AD use for transfer. Pt denied SOB and weakness in LE. Pt stated he feels much better this date compared to days prior. Overall, pt tolerated session well. Pt was left in recliner with all needs in reach.    Kathee Delton Mobility Specialist 04/03/20, 1:00 PM

## 2020-04-03 NOTE — Progress Notes (Signed)
PROGRESS NOTE    Orren A Kutz  ACZ:[id number] DOB: 11-29-1950 DOA: 04/01/2020 PCP: Adin Hector, MD    Brief Narrative:  Patient is 70 year old gentleman with history of alcoholic liver cirrhosis, GERD, dyslipidemia, depression presented to the ER with shortness of breath and abdominal distention.  He does have liver cirrhosis and had difficulty tolerating fluid control recently.  In the emergency room, he was found with elevated troponins 1335-1153.  CT angiogram with no evidence of PE.  Patient was admitted with suspected non-STEMI, however found to have normal coronaries.   Assessment & Plan:   Principal Problem:   NSTEMI (non-ST elevated myocardial infarction) ([place]) Active Problems:   Rectal bleeding   Depression   GERD (gastroesophageal reflux disease)   Alcoholic cirrhosis of liver with ascites ([place])  Suspected non-ST elevation MI: Ruled out.  Cardiac cath with normal coronaries.  His symptoms are mostly related to decompensated liver failure and ascites. Continue aspirin.  Statin continued, LFTs were normal.    Decompensated alcoholic cirrhosis of liver with tense ascites: Multiple attempts to optimize diuretic therapy and difficulty tolerating.  Presents with anasarca and tense ascites. Paracentesis 3/8.  7 L removed.  50 g of albumin was given.  Ascites fluid with no evidence of infection. INR 1.6.  Mental status normal.  No evidence of hepatic encephalopathy.   Resume Lasix 40 mg daily, Aldactone 100 mg daily.  Patient is already on rifaximin that is continued. Patient with known cirrhosis, currently without active bleeding.  Not tolerated beta-blocker in the past. Continue Protonix 40 mg twice a day today. Followed by GI.  Liver ultrasound with equivocal finding of portal thrombosis. Recommended continued diuresis to optimize his fluid status before discharge.  Essential hypertension: Blood pressure stable on current regimen.  Continue.  Depression: Stable.  On  home medications BuSpar and Zoloft.   DVT prophylaxis: SCDs   Code Status: Full code Family Communication: None today. Disposition Plan: Status is: Inpatient  Remains inpatient appropriate because:IV treatments appropriate due to intensity of illness or inability to take PO and Inpatient level of care appropriate due to severity of illness   Dispo: The patient is from: Home              Anticipated d/c is to: Home              Patient currently is not medically stable to d/c.   Difficult to place patient No         Consultants:   Gastroenterology  Cardiology  Procedures:   Cardiac cath.  3/7  Antimicrobials:   Rifaximin, long-term   Subjective: Patient seen and examined.  No overnight events.  He does feel more comfortable getting out of the bed and less distention of the abdomen.  Denies any nausea or vomiting. Objective: Vitals:   04/03/20 0458 04/03/20 0900 04/03/20 1131 04/03/20 1146  BP: (!) 160/89 (!) 161/87 135/67 127/72  Pulse: 100 (!) 107 77 74  Resp: 16 17 18 18   Temp: 99 F (37.2 C) 98.7 F (37.1 C) 98.7 F (37.1 C) 98.8 F (37.1 C)  TempSrc: Oral Oral Oral Oral  SpO2: 100% 100% 100% 100%  Weight:      Height:        Intake/Output Summary (Last 24 hours) at 04/03/2020 1346 Last data filed at 04/03/2020 0950 Gross per 24 hour  Intake 1465.42 ml  Output 2 ml  Net 1463.42 ml   Filed Weights   04/01/20 2022  Weight: 95.8  kg    Examination:  General exam: Appears calm and comfortable Chronically sick looking but not in any distress. Respiratory system: Clear to auscultation.  No added sounds. Cardiovascular system: S1 & S2 heard, RRR. No JVD, murmurs, rubs, gallops or clicks.  1+ bilateral pedal edema. Gastrointestinal system: Abdomen is distended but nontender. Bowel sounds present.  No organomegaly. Central nervous system: Alert and oriented. No focal neurological deficits. Extremities: Symmetric 5 x 5 power. Skin: No rashes, lesions  or ulcers Psychiatry: Judgement and insight appear normal. Mood & affect appropriate.     Data Reviewed: I have personally reviewed following labs and imaging studies  CBC: Recent Labs  Lab 04/01/20 0808 04/02/20 0520 04/03/20 0531  WBC 7.7 8.5 4.6  NEUTROABS  --   --  2.5  HGB 10.3* 10.1* 10.0*  HCT 29.9* 29.9* 29.4*  MCV 95.2 95.2 95.1  PLT 141* 141* 630*   Basic Metabolic Panel: Recent Labs  Lab 04/01/20 0808 04/02/20 0520 04/03/20 0531  NA 140 140 140  K 3.8 3.8 3.5  CL 106 108 109  CO2 23 24 24   GLUCOSE 124* 109* 103*  BUN 14 15 14   CREATININE 0.94 1.00 1.12  CALCIUM 9.0 8.7* 8.4*  MG  --   --  2.0  PHOS  --   --  3.3   GFR: Estimated Creatinine Clearance: 65.3 mL/min (by C-G formula based on SCr of 1.12 mg/dL). Liver Function Tests: Recent Labs  Lab 04/03/20 0531  AST 49*  ALT 26  ALKPHOS 82  BILITOT 2.1*  PROT 6.3*  ALBUMIN 2.9*   No results for input(s): LIPASE, AMYLASE in the last 168 hours. No results for input(s): AMMONIA in the last 168 hours. Coagulation Profile: Recent Labs  Lab 04/03/20 0531  INR 1.6*   Cardiac Enzymes: No results for input(s): CKTOTAL, CKMB, CKMBINDEX, TROPONINI in the last 168 hours. BNP (last 3 results) No results for input(s): PROBNP in the last 8760 hours. HbA1C: No results for input(s): HGBA1C in the last 72 hours. CBG: No results for input(s): GLUCAP in the last 168 hours. Lipid Profile: Recent Labs    04/02/20 0520  CHOL 102  HDL 36*  LDLCALC 55  TRIG 57  CHOLHDL 2.8   Thyroid Function Tests: No results for input(s): TSH, T4TOTAL, FREET4, T3FREE, THYROIDAB in the last 72 hours. Anemia Panel: No results for input(s): VITAMINB12, FOLATE, FERRITIN, TIBC, IRON, RETICCTPCT in the last 72 hours. Sepsis Labs: No results for input(s): PROCALCITON, LATICACIDVEN in the last 168 hours.  Recent Results (from the past 240 hour(s))  Aerobic/Anaerobic Culture w Gram Stain (surgical/deep wound)     Status:  None (Preliminary result)   Collection Time: 04/02/20  2:45 PM   Specimen: PATH Cytology Peritoneal fluid  Result Value Ref Range Status   Specimen Description   Final    PERITONEAL Performed at [facility], [address] [place], [place] [address]    Special Requests   Final    NONE Performed at [facility], [place] [place], [place] [address]    Gram Stain NO WBC SEEN NO ORGANISMS SEEN   Final   Culture   Final    NO GROWTH < 24 HOURS Performed at [facility], [place] [address] [place], [place] [address]    Report Status PENDING  Incomplete         Radiology Studies: CARDIAC CATHETERIZATION  Result Date: 04/02/2020 1. Normal coronary anatomy 2. Normal left ventricular function Recommendations  1. Medical therapy 2. Aggressive risk factor modification  US Paracentesis  Result Date: 04/02/2020 INDICATION: Patient with history of alcoholic cirrhosis with large volume ascites. Request is for therapeutic and diagnostic paracentesis. EXAM: ULTRASOUND GUIDED THERAPEUTIC AND DIAGNOSTIC PARACENTESIS MEDICATIONS: Lidocaine 1% 10 mL COMPLICATIONS: None immediate. PROCEDURE: Informed written consent was obtained from the patient after a discussion of the risks, benefits and alternatives to treatment. A timeout was performed prior to the initiation of the procedure. Initial ultrasound scanning demonstrates a large amount of ascites within the right lower abdominal quadrant. The right lower abdomen was prepped and draped in the usual sterile fashion. 1% lidocaine was used for local anesthesia. Following this, a 19 gauge, 7-cm, Yueh catheter was introduced. An ultrasound image was saved for documentation purposes. The paracentesis was performed. The catheter was removed and a dressing was applied. The patient tolerated the procedure well without immediate post procedural complication. Patient received post-procedure intravenous albumin; see nursing notes for  details. FINDINGS: A total of approximately 7 L of straw-colored fluid was removed. Samples were sent to the laboratory as requested by the clinical team. IMPRESSION: Successful ultrasound-guided therapeutic and diagnostic paracentesis yielding 7 liters of peritoneal fluid. Read by: Rushie Nyhan, NP Electronically Signed   By: Sandi Mariscal M.D.   On: 04/02/2020 15:35   ECHOCARDIOGRAM COMPLETE  Result Date: 04/03/2020    ECHOCARDIOGRAM REPORT   Patient Name:   Kamerin A Sellick Date of Exam: 04/02/2020 Medical Rec #:  [id number]     Height:       65.0 in Accession #:    [id number]    Weight:       211.1 lb Date of Birth:  1950-02-26     BSA:          2.024 m Patient Age:    80 years      BP:           165/85 mmHg Patient Gender: M             HR:           83 bpm. Exam Location:  [facility] Procedure: 2D Echo, Cardiac Doppler and Color Doppler Indications:     NSTEMI I21.4  History:         Patient has no prior history of Echocardiogram examinations.                  Stroke; Risk Factors:Hypertension.  Sonographer:     Alyse Low Roar Referring Phys:  [id number] Diagnosing Phys: Nelva Bush MD IMPRESSIONS  1. Left ventricular ejection fraction, by estimation, is 55 to 60%. The left ventricle has normal function. Left ventricular endocardial border not optimally defined to evaluate regional wall motion. There is mild left ventricular hypertrophy. Left ventricular diastolic parameters are consistent with Grade I diastolic dysfunction (impaired relaxation). Elevated left atrial pressure.  2. Right ventricular systolic function is normal. The right ventricular size is normal. There is normal pulmonary artery systolic pressure.  3. Left atrial size was mildly dilated.  4. The mitral valve is degenerative. Trivial mitral valve regurgitation. No evidence of mitral stenosis.  5. The aortic valve was not well visualized. Aortic valve regurgitation is not visualized. No aortic stenosis is present. FINDINGS  Left  Ventricle: Left ventricular ejection fraction, by estimation, is 55 to 60%. The left ventricle has normal function. Left ventricular endocardial border not optimally defined to evaluate regional wall motion. The left ventricular internal cavity size was normal in size. There is mild  left ventricular hypertrophy. Left ventricular diastolic parameters are consistent with Grade I diastolic dysfunction (impaired relaxation). Elevated left atrial pressure. Right Ventricle: The right ventricular size is normal. No increase in right ventricular wall thickness. Right ventricular systolic function is normal. There is normal pulmonary artery systolic pressure. Left Atrium: Left atrial size was mildly dilated. Right Atrium: Right atrial size was normal in size. Pericardium: The pericardium was not well visualized. Mitral Valve: The mitral valve is degenerative in appearance. There is mild calcification of the mitral valve leaflet(s). Mild mitral annular calcification. Trivial mitral valve regurgitation. No evidence of mitral valve stenosis. Tricuspid Valve: The tricuspid valve is not well visualized. Tricuspid valve regurgitation is trivial. Aortic Valve: The aortic valve was not well visualized. Aortic valve regurgitation is not visualized. No aortic stenosis is present. Aortic valve peak gradient measures 7.5 mmHg. Pulmonic Valve: The pulmonic valve was not well visualized. Pulmonic valve regurgitation is trivial. No evidence of pulmonic stenosis. Aorta: The aortic root is normal in size and structure. Pulmonary Artery: The pulmonary artery is not well seen. Venous: The inferior vena cava was not well visualized. IAS/Shunts: The interatrial septum was not well visualized.  LEFT VENTRICLE PLAX 2D LVIDd:         4.99 cm  Diastology LVIDs:         3.67 cm  LV e' medial:    5.22 cm/s LV PW:         1.22 cm  LV E/e' medial:  17.3 LV IVS:        1.32 cm  LV e' lateral:   7.40 cm/s LVOT diam:     2.10 cm  LV E/e' lateral: 12.2 LVOT  Area:     3.46 cm  RIGHT VENTRICLE RV Mid diam:    2.81 cm RV S prime:     16.10 cm/s TAPSE (M-mode): 1.8 cm LEFT ATRIUM             Index       RIGHT ATRIUM           Index LA diam:        4.45 cm 2.20 cm/m  RA Area:     11.80 cm LA Vol (A2C):   51.4 ml 25.39 ml/m RA Volume:   20.20 ml  9.98 ml/m LA Vol (A4C):   57.6 ml 28.46 ml/m LA Biplane Vol: 55.2 ml 27.27 ml/m  AORTIC VALVE                PULMONIC VALVE AV Area (Vmax): 2.78 cm    PV Vmax:        1.11 m/s AV Vmax:        137.00 cm/s PV Peak grad:   4.9 mmHg AV Peak Grad:   7.5 mmHg    RVOT Peak grad: 3 mmHg LVOT Vmax:      110.00 cm/s  AORTA Ao Root diam: 3.00 cm MITRAL VALVE                TRICUSPID VALVE MV Area (PHT): 3.93 cm     TR Peak grad:   25.4 mmHg MV Decel Time: 193 msec     TR Vmax:        252.00 cm/s MV E velocity: 90.30 cm/s MV A velocity: 145.00 cm/s  SHUNTS MV E/A ratio:  0.62         Systemic Diam: 2.10 cm MV A Prime:    18.3 cm/s Nelva Bush MD Electronically signed by Nelva Bush MD  Signature Date/Time: 04/03/2020/6:37:06 AM    Final    US LIVER DOPPLER  Result Date: 04/03/2020 CLINICAL DATA:  Alcoholic cirrhosis with ascites EXAM: DUPLEX ULTRASOUND OF LIVER TECHNIQUE: Color and duplex Doppler ultrasound was performed to evaluate the hepatic in-flow and out-flow vessels. COMPARISON:  CT abdomen pelvis with contrast 07/21/2018 FINDINGS: Liver: Increased heterogeneity of the parenchymal echogenicity. Nodular hepatic contours consistent with cirrhosis. No focal lesion, mass or intrahepatic biliary ductal dilatation. Main Portal Vein size: 1.5 cm Portal Vein Velocities Main Prox:  No flow identified Main Mid: 22 cm/sec Main Dist:  18 cm/sec Right: 38 cm/sec Left: 41 cm/sec Hepatic Vein Velocities Right:  13 cm/sec Middle:  13 cm/sec Left:  32 cm/sec IVC: Present and patent with normal respiratory phasicity. Hepatic Artery Velocity:  121 cm/sec Splenic Vein Velocity:  17 cm/sec Spleen: 7.9 cm x 12.7 cm x 5.3 cm with a total  volume of 278 cm^3 (411 cm^3 is upper limit normal) Portal Vein Occlusion/Thrombus: Findings suspicious for occlusion of proximal portal vein. Splenic Vein Occlusion/Thrombus: No Ascites: Mild Varices: None IMPRESSION: 1. Cirrhotic liver morphology. No focal hepatic lesion. Mild ascites. 2. No flow detected in the proximal portal vein. Mid and distal portal vein is patent. Intrahepatic segment of the portal vein are patent. Findings may be artifact, however partial portal vein thrombosis is also included in the differential. Further evaluation with contrast enhanced abdominal MRI would be beneficial to determine degree of portal vein occlusion. Electronically Signed   By: Miachel Roux M.D.   On: 04/03/2020 10:27        Scheduled Meds: . amLODipine  5 mg Oral Daily  . aspirin EC  81 mg Oral Daily  . atorvastatin  20 mg Oral Daily  . busPIRone  7.5 mg Oral BID  . furosemide  40 mg Intravenous Once  . furosemide  40 mg Oral Daily  . lactulose  13.3333 g Oral BID  . pantoprazole  40 mg Intravenous Q12H  . propranolol  10 mg Oral BID  . rifaximin  550 mg Oral BID  . sertraline  25 mg Oral Daily  . sodium chloride flush  3 mL Intravenous Q12H  . sodium chloride flush  3 mL Intravenous Q12H  . [START ON 04/04/2020] spironolactone  150 mg Oral Daily   Continuous Infusions: . sodium chloride       LOS: 2 days    Time spent: 30 minutes    Barb Merino, MD [facility] Pager [phone]

## 2020-04-04 ENCOUNTER — Inpatient Hospital Stay: Payer: Medicare HMO

## 2020-04-04 DIAGNOSIS — K7031 Alcoholic cirrhosis of liver with ascites: Secondary | ICD-10-CM | POA: Diagnosis not present

## 2020-04-04 LAB — COMPREHENSIVE METABOLIC PANEL
ALT: 24 U/L (ref 0–44)
AST: 40 U/L (ref 15–41)
Albumin: 2.5 g/dL — ABNORMAL LOW (ref 3.5–5.0)
Alkaline Phosphatase: 79 U/L (ref 38–126)
Anion gap: 4 — ABNORMAL LOW (ref 5–15)
BUN: 13 mg/dL (ref 8–23)
CO2: 27 mmol/L (ref 22–32)
Calcium: 8.4 mg/dL — ABNORMAL LOW (ref 8.9–10.3)
Chloride: 108 mmol/L (ref 98–111)
Creatinine, Ser: 0.94 mg/dL (ref 0.61–1.24)
GFR, Estimated: >60 mL/min (ref >60)
Glucose, Bld: 107 mg/dL — ABNORMAL HIGH (ref 70–99)
Potassium: 3.8 mmol/L (ref 3.5–5.1)
Sodium: 139 mmol/L (ref 135–145)
Total Bilirubin: 1.7 mg/dL — ABNORMAL HIGH (ref 0.3–1.2)
Total Protein: 5.6 g/dL — ABNORMAL LOW (ref 6.5–8.1)

## 2020-04-04 LAB — PROTEIN, BODY FLUID (OTHER): Total Protein, Body Fluid Other: 0.5 g/dL

## 2020-04-04 LAB — CYTOLOGY - NON PAP

## 2020-04-04 MED ORDER — FUROSEMIDE 10 MG/ML IJ SOLN
40.0000 mg | Freq: Once | INTRAMUSCULAR | Status: AC
  Administered 2020-04-04: 40 mg via INTRAVENOUS
  Filled 2020-04-04: qty 4

## 2020-04-04 MED ORDER — FUROSEMIDE 10 MG/ML IJ SOLN
4.0000 mg/h | INTRAVENOUS | Status: DC
  Administered 2020-04-04 – 2020-04-06 (×2): 4 mg/h via INTRAVENOUS
  Filled 2020-04-04 (×2): qty 20

## 2020-04-04 MED ORDER — ALBUMIN HUMAN 25 % IV SOLN
50.0000 g | Freq: Once | INTRAVENOUS | Status: AC
  Administered 2020-04-04: 50 g via INTRAVENOUS
  Filled 2020-04-04: qty 200

## 2020-04-04 MED ORDER — SPIRONOLACTONE 25 MG PO TABS
100.0000 mg | ORAL_TABLET | Freq: Two times a day (BID) | ORAL | Status: DC
  Administered 2020-04-04 – 2020-04-06 (×4): 100 mg via ORAL
  Filled 2020-04-04 (×4): qty 4

## 2020-04-04 NOTE — Plan of Care (Signed)
Nutrition Education Note  RD consulted for nutrition education regarding cirrhosis.  Met with patient at bedside this morning, family friends present in room.   RD provided "Low Sodium Nutrition Therapy" handout from the [employer]. Reviewed patient's dietary recall. Provided examples on ways to decrease sodium intake in diet. Discouraged intake of processed foods and use of salt shaker, discussed good sources of lean proteins and encouraged fresh fruits and vegetables as well as whole grain sources of carbohydrates to maximize fiber intake. Educated on small frequent meals/snacks, encouraged intake of oral nutrition supplements to aid with meeting needs, recommended sitting upright for at least 1 hour after eating to aid with digestion, and the importance of exercise as able to maintain muscle mass.  RD discussed why it is important for patient to adhere to diet recommendations, and emphasized the role of fluids, foods to avoid, and importance of weighing self daily. Teach back method used.  Expect good compliance.  Body mass index is 32.38 kg/m. Pt meets criteria for obese based on current BMI.  Current diet order is 2 gram Na, patient is consuming approximately 60% of meals at this time. Labs and medications reviewed. No further nutrition interventions warranted at this time. RD contact information provided. Will provide coupons for Ensure supplements today. If additional nutrition issues arise, please re-consult RD.   Lajuan Lines, RD, LDN Clinical Nutrition After Hours/Weekend Pager # [phone]

## 2020-04-04 NOTE — Procedures (Signed)
Pre Procedural Dx: Symptomatic Ascites Post Procedural Dx: Same  Successful US guided paracentesis yielding 4.8 L of serous ascitic fluid.  EBL: None Complications: None immediate  Ronny Bacon, MD Pager #: [phone]

## 2020-04-04 NOTE — Care Management Important Message (Signed)
Important Message  Patient Details  Name: Jeff Ward MRN: [id number] Date of Birth: [birth year]   Medicare Important Message Given:  Yes     Dannette Barbara 04/04/2020, 11:09 AM

## 2020-04-04 NOTE — Progress Notes (Signed)
[facility] NAME: Wilbon Obenchain    MR#:  [id number]  DATE OF BIRTH:  Nov 03, 1950  SUBJECTIVE:  family in the room. Patient patient complains of leg swelling. Overall feels better after removal of fluid from the belly two days ago. Tolerating PO diet.  REVIEW OF SYSTEMS:   Review of Systems  Constitutional: Negative for chills, fever and weight loss.  HENT: Negative for ear discharge, ear pain and nosebleeds.   Eyes: Negative for blurred vision, pain and discharge.  Respiratory: Negative for sputum production, shortness of breath, wheezing and stridor.   Cardiovascular: Positive for leg swelling. Negative for chest pain, palpitations, orthopnea and PND.  Gastrointestinal: Negative for abdominal pain, diarrhea, nausea and vomiting.       Abdominal distention/tightness  Genitourinary: Negative for frequency and urgency.  Musculoskeletal: Negative for back pain and joint pain.  Neurological: Negative for sensory change, speech change, focal weakness and weakness.  Psychiatric/Behavioral: Negative for depression and hallucinations. The patient is not nervous/anxious.    Tolerating Diet:yesTolerating PT: ambulating with his cane in the hallways  DRUG ALLERGIES:   Allergies  Allergen Reactions  . Bee Venom Anaphylaxis  . Sulfa Antibiotics Other (See Comments)    Reaction:  Fainting   . Duloxetine Nausea Only    VITALS:  Blood pressure (!) 145/71, pulse 64, temperature 98.2 F (36.8 C), temperature source Oral, resp. rate 16, height 5\' 5"  (1.651 m), weight 88.3 kg, SpO2 99 %.  PHYSICAL EXAMINATION:   Physical Exam  GENERAL:  70 y.o.-year-old patient lying in the bed with no acute distress.  LUNGS: Normal breath sounds bilaterally, no wheezing, rales, rhonchi. No use of accessory muscles of respiration.  CARDIOVASCULAR: S1, S2 normal. No murmurs, rubs, or gallops.  ABDOMEN: nontender, +distended.Ascites+ EXTREMITIES: 3+ edema     NEUROLOGIC: Cranial nerves II through XII are intact. No focal Motor or sensory deficits b/l.   PSYCHIATRIC:  patient is alert and oriented x 3.  SKIN: No obvious rash, lesion, or ulcer.   LABORATORY PANEL:  CBC Recent Labs  Lab 04/03/20 0531  WBC 4.6  HGB 10.0*  HCT 29.4*  PLT 120*    Chemistries  Recent Labs  Lab 04/03/20 0531 04/04/20 0653  NA 140 139  K 3.5 3.8  CL 109 108  CO2 24 27  GLUCOSE 103* 107*  BUN 14 13  CREATININE 1.12 0.94  CALCIUM 8.4* 8.4*  MG 2.0  --   AST 49* 40  ALT 26 24  ALKPHOS 82 79  BILITOT 2.1* 1.7*   Cardiac Enzymes No results for input(s): TROPONINI in the last 168 hours. RADIOLOGY:  ECHOCARDIOGRAM COMPLETE  Result Date: 04/03/2020    ECHOCARDIOGRAM REPORT   Patient Name:   Raheel A Rufino Date of Exam: 04/02/2020 Medical Rec #:  [id number]     Height:       65.0 in Accession #:    [id number]    Weight:       211.1 lb Date of Birth:  [birth year]     BSA:          2.024 m Patient Age:    70 years      BP:           165/85 mmHg Patient Gender: M             HR:           83 bpm. Exam Location:  [facility] Procedure: 2D Echo, Cardiac  Doppler and Color Doppler Indications:     NSTEMI I21.4  History:         Patient has no prior history of Echocardiogram examinations.                  Stroke; Risk Factors:Hypertension.  Sonographer:     Alyse Low Roar Referring Phys:  [id number] AGBATA Diagnosing Phys: Nelva Bush MD IMPRESSIONS  1. Left ventricular ejection fraction, by estimation, is 55 to 60%. The left ventricle has normal function. Left ventricular endocardial border not optimally defined to evaluate regional wall motion. There is mild left ventricular hypertrophy. Left ventricular diastolic parameters are consistent with Grade I diastolic dysfunction (impaired relaxation). Elevated left atrial pressure.  2. Right ventricular systolic function is normal. The right ventricular size is normal. There is normal pulmonary artery systolic pressure.  3. Left  atrial size was mildly dilated.  4. The mitral valve is degenerative. Trivial mitral valve regurgitation. No evidence of mitral stenosis.  5. The aortic valve was not well visualized. Aortic valve regurgitation is not visualized. No aortic stenosis is present. FINDINGS  Left Ventricle: Left ventricular ejection fraction, by estimation, is 55 to 60%. The left ventricle has normal function. Left ventricular endocardial border not optimally defined to evaluate regional wall motion. The left ventricular internal cavity size was normal in size. There is mild left ventricular hypertrophy. Left ventricular diastolic parameters are consistent with Grade I diastolic dysfunction (impaired relaxation). Elevated left atrial pressure. Right Ventricle: The right ventricular size is normal. No increase in right ventricular wall thickness. Right ventricular systolic function is normal. There is normal pulmonary artery systolic pressure. Left Atrium: Left atrial size was mildly dilated. Right Atrium: Right atrial size was normal in size. Pericardium: The pericardium was not well visualized. Mitral Valve: The mitral valve is degenerative in appearance. There is mild calcification of the mitral valve leaflet(s). Mild mitral annular calcification. Trivial mitral valve regurgitation. No evidence of mitral valve stenosis. Tricuspid Valve: The tricuspid valve is not well visualized. Tricuspid valve regurgitation is trivial. Aortic Valve: The aortic valve was not well visualized. Aortic valve regurgitation is not visualized. No aortic stenosis is present. Aortic valve peak gradient measures 7.5 mmHg. Pulmonic Valve: The pulmonic valve was not well visualized. Pulmonic valve regurgitation is trivial. No evidence of pulmonic stenosis. Aorta: The aortic root is normal in size and structure. Pulmonary Artery: The pulmonary artery is not well seen. Venous: The inferior vena cava was not well visualized. IAS/Shunts: The interatrial septum was  not well visualized.  LEFT VENTRICLE PLAX 2D LVIDd:         4.99 cm  Diastology LVIDs:         3.67 cm  LV e' medial:    5.22 cm/s LV PW:         1.22 cm  LV E/e' medial:  17.3 LV IVS:        1.32 cm  LV e' lateral:   7.40 cm/s LVOT diam:     2.10 cm  LV E/e' lateral: 12.2 LVOT Area:     3.46 cm  RIGHT VENTRICLE RV Mid diam:    2.81 cm RV S prime:     16.10 cm/s TAPSE (M-mode): 1.8 cm LEFT ATRIUM             Index       RIGHT ATRIUM           Index LA diam:        4.45 cm 2.20  cm/m  RA Area:     11.80 cm LA Vol (A2C):   51.4 ml 25.39 ml/m RA Volume:   20.20 ml  9.98 ml/m LA Vol (A4C):   57.6 ml 28.46 ml/m LA Biplane Vol: 55.2 ml 27.27 ml/m  AORTIC VALVE                PULMONIC VALVE AV Area (Vmax): 2.78 cm    PV Vmax:        1.11 m/s AV Vmax:        137.00 cm/s PV Peak grad:   4.9 mmHg AV Peak Grad:   7.5 mmHg    RVOT Peak grad: 3 mmHg LVOT Vmax:      110.00 cm/s  AORTA Ao Root diam: 3.00 cm MITRAL VALVE                TRICUSPID VALVE MV Area (PHT): 3.93 cm     TR Peak grad:   25.4 mmHg MV Decel Time: 193 msec     TR Vmax:        252.00 cm/s MV E velocity: 90.30 cm/s MV A velocity: 145.00 cm/s  SHUNTS MV E/A ratio:  0.62         Systemic Diam: 2.10 cm MV A Prime:    18.3 cm/s Nelva Bush MD Electronically signed by Nelva Bush MD Signature Date/Time: 04/03/2020/6:37:06 AM    Final    US LIVER DOPPLER  Result Date: 04/03/2020 CLINICAL DATA:  Alcoholic cirrhosis with ascites EXAM: DUPLEX ULTRASOUND OF LIVER TECHNIQUE: Color and duplex Doppler ultrasound was performed to evaluate the hepatic in-flow and out-flow vessels. COMPARISON:  CT abdomen pelvis with contrast 07/21/2018 FINDINGS: Liver: Increased heterogeneity of the parenchymal echogenicity. Nodular hepatic contours consistent with cirrhosis. No focal lesion, mass or intrahepatic biliary ductal dilatation. Main Portal Vein size: 1.5 cm Portal Vein Velocities Main Prox:  No flow identified Main Mid: 22 cm/sec Main Dist:  18 cm/sec Right: 38  cm/sec Left: 41 cm/sec Hepatic Vein Velocities Right:  13 cm/sec Middle:  13 cm/sec Left:  32 cm/sec IVC: Present and patent with normal respiratory phasicity. Hepatic Artery Velocity:  121 cm/sec Splenic Vein Velocity:  17 cm/sec Spleen: 7.9 cm x 12.7 cm x 5.3 cm with a total volume of 278 cm^3 (411 cm^3 is upper limit normal) Portal Vein Occlusion/Thrombus: Findings suspicious for occlusion of proximal portal vein. Splenic Vein Occlusion/Thrombus: No Ascites: Mild Varices: None IMPRESSION: 1. Cirrhotic liver morphology. No focal hepatic lesion. Mild ascites. 2. No flow detected in the proximal portal vein. Mid and distal portal vein is patent. Intrahepatic segment of the portal vein are patent. Findings may be artifact, however partial portal vein thrombosis is also included in the differential. Further evaluation with contrast enhanced abdominal MRI would be beneficial to determine degree of portal vein occlusion. Electronically Signed   By: Miachel Roux M.D.   On: 04/03/2020 10:27   ASSESSMENT AND PLAN:  Henson A Brahmbhatt is a 70 y.o. male with medical history significant for alcoholic liver cirrhosis, GERD, dyslipidemia, depression who presents to the ER for evaluation of shortness of breath.  Decompensated alcoholic cirrhosis of liver with tense ascites: --Multiple attempts to optimize diuretic therapy and difficulty tolerating.  --Presents with anasarca and tense ascites. --3/8  First Paracentesis -->  7 L removed. No SBP --Resume Lasix 40 mg daily, Aldactone 100 mg daily, rifaximin , lactulose --Patient with known cirrhosis, currently without active bleeding.  Not tolerated beta-blocker in the past. --continue Protonix 40  mg twice a day today. --Followed by GI Dr Waldron Session ultrasound with equivocal finding of portal thrombosis. --3/9--- D/w GI and  Recommended continued IV lasix gtt for 24 hours to  optimize his fluid status before discharge. --will repeat Paracentesis today  Suspected non-ST  elevation MI: Ruled out.  -- Cardiac cath with normal coronaries.  His symptoms are mostly related to decompensated liver failure and ascites. --Continue aspirin.  Statin  -- LFTs were normal.    Essential hypertension: Blood pressure stable on current regimen.   Depression: Stable.  On home medications BuSpar and Zoloft.   DVT prophylaxis: SCDs  patient has multiple comorbidities and high 12 year mortality index. Will get palliative care discuss goals of care and end-of-life issues.  Code Status: Full code Family Communication: sister at [facility] Disposition Plan: Status is: Inpatient  Remains inpatient appropriate because:IV treatments appropriate due to intensity of illness or inability to take PO and Inpatient level of care appropriate due to severity of illness   Dispo: The patient is from: Home  Anticipated d/c is to: Home  Patient currently is not medically stable to d/c.              Difficult to place patient No  patient to get repeat paracentesis today. Go to be placed on Lasix drip for 24 hours diurese aggressively if patient tolerates.         TOTAL TIME TAKING CARE OF THIS PATIENT: *25* minutes.  >50% time spent on counselling and coordination of care  Note: This dictation was prepared with Dragon dictation along with smaller phrase technology. Any transcriptional errors that result from this process are unintentional.  Fritzi Mandes M.D    [facility]   CC: Primary care physician; Adin Hector, MDPatient ID: [name], male   DOB: [birth year], 70 y.o.   MRN: [id number]

## 2020-04-04 NOTE — Progress Notes (Signed)
Jeff Darby, MD [address]  [place]  [place], [place] [address]  Main: [phone]  Fax: [phone] Pager: [phone]   Subjective: Patient is sitting up in chair and eating his lunch.  He reports feeling better with regards to his abdominal discomfort since removal of the fluid.  He does report significant swelling in his legs   Objective: Vital signs in last 24 hours: Vitals:   04/04/20 0733 04/04/20 1117 04/04/20 1538 04/04/20 1607  BP: 129/71 122/65 (!) 141/75 (!) 145/71  Pulse: 63 60 67 64  Resp: 17 18 16 16   Temp: 98.2 F (36.8 C) 98.2 F (36.8 C)    TempSrc: Oral Oral    SpO2: 95% 100% 99% 99%  Weight:      Height:       Weight change:   Intake/Output Summary (Last 24 hours) at 04/04/2020 1619 Last data filed at 04/04/2020 1359 Gross per 24 hour  Intake 606 ml  Output 1100 ml  Net -494 ml     Exam: Heart:: Regular rate and rhythm, S1S2 present or without murmur or extra heart sounds Lungs: normal and clear to auscultation Abdomen: Soft, moderately distended, nontender   Lab Results: CBC Latest Ref Rng & Units 04/03/2020 04/02/2020 04/01/2020  WBC 4.0 - 10.5 K/uL 4.6 8.5 7.7  Hemoglobin 13.0 - 17.0 g/dL 10.0(L) 10.1(L) 10.3(L)  Hematocrit 39.0 - 52.0 % 29.4(L) 29.9(L) 29.9(L)  Platelets 150 - 400 K/uL 120(L) 141(L) 141(L)   CMP Latest Ref Rng & Units 04/04/2020 04/03/2020 04/02/2020  Glucose 70 - 99 mg/dL 107(H) 103(H) 109(H)  BUN 8 - 23 mg/dL 13 14 15   Creatinine 0.61 - 1.24 mg/dL 0.94 1.12 1.00  Sodium 135 - 145 mmol/L 139 140 140  Potassium 3.5 - 5.1 mmol/L 3.8 3.5 3.8  Chloride 98 - 111 mmol/L 108 109 108  CO2 22 - 32 mmol/L 27 24 24   Calcium 8.9 - 10.3 mg/dL 8.4(L) 8.4(L) 8.7(L)  Total Protein 6.5 - 8.1 g/dL 5.6(L) 6.3(L) -  Total Bilirubin 0.3 - 1.2 mg/dL 1.7(H) 2.1(H) -  Alkaline Phos 38 - 126 U/L 79 82 -  AST 15 - 41 U/L 40 49(H) -  ALT 0 - 44 U/L 24 26 -    Micro Results: Recent Results (from the past 240 hour(s))  Fungus  Culture With Stain     Status: None (Preliminary result)   Collection Time: 04/02/20  2:45 PM   Specimen: PATH Cytology Peritoneal fluid  Result Value Ref Range Status   Fungus Stain Final report  Final    Comment: (NOTE) Performed At: [facility] [address] [place], [place] [id number] [place] Farmer MD QQ:[id number]    Fungus (Mycology) Culture PENDING  Incomplete   Fungal Source PERITONEAL  Final    Comment: Performed at [facility], [address] [place], [place] [address]  Aerobic/Anaerobic Culture w Gram Stain (surgical/deep wound)     Status: None (Preliminary result)   Collection Time: 04/02/20  2:45 PM   Specimen: PATH Cytology Peritoneal fluid  Result Value Ref Range Status   Specimen Description   Final    PERITONEAL Performed at [facility], [address] [place], [place] [address]    Special Requests   Final    NONE Performed at [facility], [address] [place], [place] [address]    Gram Stain NO WBC SEEN NO ORGANISMS SEEN   Final   Culture   Final    NO  GROWTH 2 DAYS NO ANAEROBES ISOLATED; CULTURE IN PROGRESS FOR 5 DAYS Performed at [facility], [place] [address], [place], [place] [address]    Report Status PENDING  Incomplete  Fungus Culture Result     Status: None   Collection Time: 04/02/20  2:45 PM  Result Value Ref Range Status   Result 1 Comment  Final    Comment: (NOTE) KOH/Calcofluor preparation:  no fungus observed. Performed At: [facility] [place], [place] [id number] Rush Farmer MD HW:[id number]    Studies/Results: ECHOCARDIOGRAM COMPLETE  Result Date: 04/03/2020    ECHOCARDIOGRAM REPORT   Patient Name:   Jeff Ward Date of Exam: 04/02/2020 Medical Rec #:  [id number]     Height:       65.0 in Accession #:    [id number]    Weight:       211.1 lb Date of Birth:  10-01-1950     BSA:          2.024 m Patient Age:    70 years      BP:           165/85 mmHg Patient  Gender: M             HR:           83 bpm. Exam Location:  [facility] Procedure: 2D Echo, Cardiac Doppler and Color Doppler Indications:     NSTEMI I21.4  History:         Patient has no prior history of Echocardiogram examinations.                  Stroke; Risk Factors:Hypertension.  Sonographer:     Alyse Low Roar Referring Phys:  [id number] AGBATA Diagnosing Phys: Nelva Bush MD IMPRESSIONS  1. Left ventricular ejection fraction, by estimation, is 55 to 60%. The left ventricle has normal function. Left ventricular endocardial border not optimally defined to evaluate regional wall motion. There is mild left ventricular hypertrophy. Left ventricular diastolic parameters are consistent with Grade I diastolic dysfunction (impaired relaxation). Elevated left atrial pressure.  2. Right ventricular systolic function is normal. The right ventricular size is normal. There is normal pulmonary artery systolic pressure.  3. Left atrial size was mildly dilated.  4. The mitral valve is degenerative. Trivial mitral valve regurgitation. No evidence of mitral stenosis.  5. The aortic valve was not well visualized. Aortic valve regurgitation is not visualized. No aortic stenosis is present. FINDINGS  Left Ventricle: Left ventricular ejection fraction, by estimation, is 55 to 60%. The left ventricle has normal function. Left ventricular endocardial border not optimally defined to evaluate regional wall motion. The left ventricular internal cavity size was normal in size. There is mild left ventricular hypertrophy. Left ventricular diastolic parameters are consistent with Grade I diastolic dysfunction (impaired relaxation). Elevated left atrial pressure. Right Ventricle: The right ventricular size is normal. No increase in right ventricular wall thickness. Right ventricular systolic function is normal. There is normal pulmonary artery systolic pressure. Left Atrium: Left atrial size was mildly dilated. Right Atrium: Right atrial  size was normal in size. Pericardium: The pericardium was not well visualized. Mitral Valve: The mitral valve is degenerative in appearance. There is mild calcification of the mitral valve leaflet(s). Mild mitral annular calcification. Trivial mitral valve regurgitation. No evidence of mitral valve stenosis. Tricuspid Valve: The tricuspid valve is not well visualized. Tricuspid valve regurgitation is trivial. Aortic Valve: The aortic valve was not well visualized. Aortic valve regurgitation  is not visualized. No aortic stenosis is present. Aortic valve peak gradient measures 7.5 mmHg. Pulmonic Valve: The pulmonic valve was not well visualized. Pulmonic valve regurgitation is trivial. No evidence of pulmonic stenosis. Aorta: The aortic root is normal in size and structure. Pulmonary Artery: The pulmonary artery is not well seen. Venous: The inferior vena cava was not well visualized. IAS/Shunts: The interatrial septum was not well visualized.  LEFT VENTRICLE PLAX 2D LVIDd:         4.99 cm  Diastology LVIDs:         3.67 cm  LV e' medial:    5.22 cm/s LV PW:         1.22 cm  LV E/e' medial:  17.3 LV IVS:        1.32 cm  LV e' lateral:   7.40 cm/s LVOT diam:     2.10 cm  LV E/e' lateral: 12.2 LVOT Area:     3.46 cm  RIGHT VENTRICLE RV Mid diam:    2.81 cm RV S prime:     16.10 cm/s TAPSE (M-mode): 1.8 cm LEFT ATRIUM             Index       RIGHT ATRIUM           Index LA diam:        4.45 cm 2.20 cm/m  RA Area:     11.80 cm LA Vol (A2C):   51.4 ml 25.39 ml/m RA Volume:   20.20 ml  9.98 ml/m LA Vol (A4C):   57.6 ml 28.46 ml/m LA Biplane Vol: 55.2 ml 27.27 ml/m  AORTIC VALVE                PULMONIC VALVE AV Area (Vmax): 2.78 cm    PV Vmax:        1.11 m/s AV Vmax:        137.00 cm/s PV Peak grad:   4.9 mmHg AV Peak Grad:   7.5 mmHg    RVOT Peak grad: 3 mmHg LVOT Vmax:      110.00 cm/s  AORTA Ao Root diam: 3.00 cm MITRAL VALVE                TRICUSPID VALVE MV Area (PHT): 3.93 cm     TR Peak grad:   25.4 mmHg MV  Decel Time: 193 msec     TR Vmax:        252.00 cm/s MV E velocity: 90.30 cm/s MV A velocity: 145.00 cm/s  SHUNTS MV E/A ratio:  0.62         Systemic Diam: 2.10 cm MV A Prime:    18.3 cm/s Nelva Bush MD Electronically signed by Nelva Bush MD Signature Date/Time: 04/03/2020/6:37:06 AM    Final    US LIVER DOPPLER  Result Date: 04/03/2020 CLINICAL DATA:  Alcoholic cirrhosis with ascites EXAM: DUPLEX ULTRASOUND OF LIVER TECHNIQUE: Color and duplex Doppler ultrasound was performed to evaluate the hepatic in-flow and out-flow vessels. COMPARISON:  CT abdomen pelvis with contrast 07/21/2018 FINDINGS: Liver: Increased heterogeneity of the parenchymal echogenicity. Nodular hepatic contours consistent with cirrhosis. No focal lesion, mass or intrahepatic biliary ductal dilatation. Main Portal Vein size: 1.5 cm Portal Vein Velocities Main Prox:  No flow identified Main Mid: 22 cm/sec Main Dist:  18 cm/sec Right: 38 cm/sec Left: 41 cm/sec Hepatic Vein Velocities Right:  13 cm/sec Middle:  13 cm/sec Left:  32 cm/sec IVC: Present and patent with normal respiratory phasicity.  Hepatic Artery Velocity:  121 cm/sec Splenic Vein Velocity:  17 cm/sec Spleen: 7.9 cm x 12.7 cm x 5.3 cm with a total volume of 278 cm^3 (411 cm^3 is upper limit normal) Portal Vein Occlusion/Thrombus: Findings suspicious for occlusion of proximal portal vein. Splenic Vein Occlusion/Thrombus: No Ascites: Mild Varices: None IMPRESSION: 1. Cirrhotic liver morphology. No focal hepatic lesion. Mild ascites. 2. No flow detected in the proximal portal vein. Mid and distal portal vein is patent. Intrahepatic segment of the portal vein are patent. Findings may be artifact, however partial portal vein thrombosis is also included in the differential. Further evaluation with contrast enhanced abdominal MRI would be beneficial to determine degree of portal vein occlusion. Electronically Signed   By: Miachel Roux M.D.   On: 04/03/2020 10:27   Medications:   I have reviewed the patient's current medications. Prior to Admission:  Medications Prior to Admission  Medication Sig Dispense Refill Last Dose  . amLODipine (NORVASC) 5 MG tablet Take 5 mg by mouth daily.   03/31/2020 at 0900  . atorvastatin (LIPITOR) 40 MG tablet Take 40 mg by mouth daily.   03/31/2020 at 2100  . busPIRone (BUSPAR) 7.5 MG tablet Take 2 tablets by mouth in the morning and at bedtime.   03/31/2020 at 2100  . furosemide (LASIX) 40 MG tablet Take 40 mg by mouth daily.   03/31/2020 at 0900  . hydrOXYzine (ATARAX/VISTARIL) 10 MG tablet Take 10 mg by mouth 3 (three) times daily as needed for itching.   03/31/2020 at 2100  . Lactulose 20 GM/30ML SOLN Take 20 mLs (13.3333 g total) by mouth in the morning and at bedtime. 1200 mL 5 03/31/2020 at 2100  . losartan (COZAAR) 25 MG tablet Take 25 mg by mouth daily.   03/31/2020 at 0900  . pantoprazole (PROTONIX) 40 MG tablet Take 1 tablet (40 mg total) by mouth daily. 90 tablet 2 03/31/2020 at 0900  . propranolol (INDERAL) 10 MG tablet Take 10 mg by mouth 2 (two) times daily.   03/31/2020 at 2100  . sertraline (ZOLOFT) 25 MG tablet Take 25 mg by mouth daily.   03/31/2020 at 0900  . spironolactone (ALDACTONE) 50 MG tablet Take 50 mg by mouth daily.   03/31/2020 at 0900  . rifaximin (XIFAXAN) 550 MG TABS tablet Take 1 tablet (550 mg total) by mouth 2 (two) times daily. (Patient not taking: No sig reported) 180 tablet 3 Not Taking at Unknown time  . tiZANidine (ZANAFLEX) 2 MG tablet Take 2 mg by mouth 3 (three) times daily.   unknown at prn  . VENTOLIN HFA 108 (90 Base) MCG/ACT inhaler Inhale 2 puffs into the lungs every 6 (six) hours as needed for wheezing or shortness of breath.    unknown at prn   Scheduled: . amLODipine  5 mg Oral Daily  . aspirin EC  81 mg Oral Daily  . atorvastatin  20 mg Oral Daily  . busPIRone  7.5 mg Oral BID  . lactulose  13.3333 g Oral BID  . pantoprazole  40 mg Intravenous Q12H  . propranolol  10 mg Oral BID  . rifaximin  550 mg  Oral BID  . sertraline  25 mg Oral Daily  . sodium chloride flush  3 mL Intravenous Q12H  . sodium chloride flush  3 mL Intravenous Q12H  . spironolactone  100 mg Oral BID   Continuous: . sodium chloride    . albumin human    . furosemide (LASIX) 200 mg in dextrose  5% 100 mL (2mg /mL) infusion     VZC:HYIFOY chloride, acetaminophen, acetaminophen, albuterol, hydrOXYzine, nitroGLYCERIN, polyvinyl alcohol, sodium chloride flush Anti-infectives (From admission, onward)   Start     Dose/Rate Route Frequency Ordered Stop   04/01/20 1245  rifaximin (XIFAXAN) tablet 550 mg        550 mg Oral 2 times daily 04/01/20 1235       Scheduled Meds: . amLODipine  5 mg Oral Daily  . aspirin EC  81 mg Oral Daily  . atorvastatin  20 mg Oral Daily  . busPIRone  7.5 mg Oral BID  . lactulose  13.3333 g Oral BID  . pantoprazole  40 mg Intravenous Q12H  . propranolol  10 mg Oral BID  . rifaximin  550 mg Oral BID  . sertraline  25 mg Oral Daily  . sodium chloride flush  3 mL Intravenous Q12H  . sodium chloride flush  3 mL Intravenous Q12H  . spironolactone  100 mg Oral BID   Continuous Infusions: . sodium chloride    . albumin human    . furosemide (LASIX) 200 mg in dextrose 5% 100 mL (2mg /mL) infusion     PRN Meds:.sodium chloride, acetaminophen, acetaminophen, albuterol, hydrOXYzine, nitroGLYCERIN, polyvinyl alcohol, sodium chloride flush   Assessment: Principal Problem:   NSTEMI (non-ST elevated myocardial infarction) ([facility]) Active Problems:   Rectal bleeding   Depression   GERD (gastroesophageal reflux disease)   Alcoholic cirrhosis of liver with ascites (HCC)  Plan: Decompensated alcoholic cirrhosis with volume overload Volume overload presenting as ascites and swelling of legs Ultrasound Dopplers revealed possible proximal partial portal vein thrombosis, no need for anticoagulation at this time, can consider MR venography Status post therapeutic paracentesis, 7 L of fluid removed, no  evidence of SBP Strict low-sodium diet, patient received 40 mg Lasix twice daily for 2 days, remains volume overloaded with worsening of abdominal distention from ascites.   Recommend therapeutic paracentesis with 50 g of albumin post paracentesis We will start Lasix drip today and increase spironolactone to 100 mg twice daily Monitor renal function and electrolytes closely Continue lactulose and rifaximin   LOS: 3 days   Rohini Vanga 04/04/2020, 4:19 PM

## 2020-04-05 DIAGNOSIS — Z7189 Other specified counseling: Secondary | ICD-10-CM

## 2020-04-05 DIAGNOSIS — Z515 Encounter for palliative care: Secondary | ICD-10-CM | POA: Diagnosis not present

## 2020-04-05 DIAGNOSIS — K7031 Alcoholic cirrhosis of liver with ascites: Secondary | ICD-10-CM | POA: Diagnosis not present

## 2020-04-05 LAB — BASIC METABOLIC PANEL
Anion gap: 8 (ref 5–15)
BUN: 13 mg/dL (ref 8–23)
CO2: 27 mmol/L (ref 22–32)
Calcium: 8.4 mg/dL — ABNORMAL LOW (ref 8.9–10.3)
Chloride: 104 mmol/L (ref 98–111)
Creatinine, Ser: 1.04 mg/dL (ref 0.61–1.24)
GFR, Estimated: >60 mL/min (ref >60)
Glucose, Bld: 98 mg/dL (ref 70–99)
Potassium: 3 mmol/L — ABNORMAL LOW (ref 3.5–5.1)
Sodium: 139 mmol/L (ref 135–145)

## 2020-04-05 MED ORDER — POTASSIUM CHLORIDE CRYS ER 20 MEQ PO TBCR
20.0000 meq | EXTENDED_RELEASE_TABLET | Freq: Two times a day (BID) | ORAL | Status: DC
  Administered 2020-04-05 – 2020-04-06 (×3): 20 meq via ORAL
  Filled 2020-04-05 (×3): qty 1

## 2020-04-05 MED ORDER — PANTOPRAZOLE SODIUM 40 MG PO TBEC
40.0000 mg | DELAYED_RELEASE_TABLET | Freq: Every day | ORAL | Status: DC
  Administered 2020-04-05 – 2020-04-06 (×2): 40 mg via ORAL
  Filled 2020-04-05 (×2): qty 1

## 2020-04-05 NOTE — Consult Note (Signed)
Consultation Note Date: 04/05/2020   Patient Name: Jeff Ward  DOB: 1950/03/20  MRN: [id number]  Age / Sex: 70 y.o., male  PCP: Adin Hector, MD Referring Physician: Fritzi Mandes, MD  Reason for Consultation: Establishing goals of care  HPI/Patient Profile: 70 y.o. male  with past medical history of alcoholic liver cirrhosis, GERD, dyslipidemia, and depression admitted on 04/01/2020 with shortness of breath. Found to have elevated troponin. Cardiac cath with normal coronaries. His symptoms are r/t decompensated liver cirrhosis and ascites. PMT consulted to discuss [place].    Clinical Assessment and Goals of Care: I have reviewed medical records including EPIC notes, labs and imaging, received report from Dr. Posey Pronto, assessed the patient and then met with patient, his aunt, and his son Quita Skye  to discuss diagnosis prognosis, [place], EOL wishes, disposition and options.  I introduced Palliative Medicine as specialized medical care for people living with serious illness. It focuses on providing relief from the symptoms and stress of a serious illness. The goal is to improve quality of life for both the patient and the family.  Patient tells me of recent decline. Difficulty with ambulation and self care d/t fluid overload. Tells me swelling in extremities and abdomen has been worsening since De[place]meber. He tells me of a very poor appetite since then.    We discussed patient's current illness and what it means in the larger context of patient's on-going co-morbidities. Discuss chronic nature of his disease.   I attempted to elicit values and goals of care important to the patient.    Advance directives, concepts specific to code status, artificial feeding and hydration, and rehospitalization were considered and discussed.   I completed a MOST form today. The patient and family outlined their wishes for the following treatment decisions:  Cardiopulmonary  Resuscitation: Attempt Resuscitation (CPR)  Medical Interventions: Full Scope of Treatment: Use intubation, advanced airway interventions, mechanical ventilation, cardioversion as indicated, medical treatment, IV fluids, etc, also provide comfort measures. Transfer to the hospital if indicated  Antibiotics: Antibiotics if indicated  IV Fluids: IV fluids if indicated  Feeding Tube: Feeding tube long-term if indicated   Patient tells me these wishes are temporary - he wishes for full scope treatment as above until some things in her personal life are sorted out. We discussed uncertainty of outcomes of above treatments. He tells me he will readdress above wishes with his family and outpatient palliative.   Discussed with patient/family the importance of continued conversation with family and the medical providers regarding overall plan of care and treatment options, ensuring decisions are within the context of the patient's values and GOCs.    Palliative Care services outpatient were explained and offered. Patient and family agreeable.   Questions and concerns were addressed. The family was encouraged to call with questions or concerns.   Patient had asked that I call his sister to provide update - no answer and unable to leave voicemail.   Primary Decision Maker PATIENT  Patient tells me his children would be next of kin and decision makers if he were unable - he does not want to name alternative HCPOA  SUMMARY OF RECOMMENDATIONS   - MOST completed as above - patient aware of serious illness - palliative to follow outpatient  Code Status/Advance Care Planning:  Full code  Prognosis:   Unable to determine  Discharge Planning: Home with Palliative Services      Primary Diagnoses: Present on Admission: . NSTEMI (non-ST elevated myocardial infarction) ([place]) .  Alcoholic cirrhosis of liver with ascites ([place]) . Depression . GERD (gastroesophageal reflux disease) . Rectal  bleeding   I have reviewed the medical record, interviewed the patient and family, and examined the patient. The following aspects are pertinent.  Past Medical History:  Diagnosis Date  . Allergy    SEASONAL  . Anemia   . Anxiety   . Arthritis    In back and all joints  . Asthma   . Chronic headache    now resolved  . CKD (chronic kidney disease)   . 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 Depression   . Esophageal varices ([place])   . GERD (gastroesophageal reflux disease)   . GI bleed   . Hiatal hernia   . History of kidney stones 1970's  . Hyperlipidemia 06/2002  . Hypertension   . Liver disease   . Psoriasis   . Stroke ([place])    PT. STATED MILD  . Substance abuse ([place])    H/O ETOH   Social History   Socioeconomic History  . Marital status: Legally Separated    Spouse name: Not on file  . Number of children: 2  . Years of education: Not on file  . Highest education level: Not on file  Occupational History  . Occupation: retired  Tobacco Use  . Smoking status: Former Smoker    Years: 35.00    Types: Cigars    Quit date: 04/02/2010    Years since quitting: 10.0  . Smokeless tobacco: Never Used  . Tobacco comment: Quit 4 years ago  Vaping Use  . Vaping Use: Never used  Substance and Sexual Activity  . Alcohol use: No    Alcohol/week: 2.0 standard drinks    Types: 2 Cans of beer per week    Comment: Used to be a heavy drinker, quit for 5 months now  . Drug use: No  . Sexual activity: Yes    Partners: Female    Comment: declined condoms  Other Topics Concern  . Not on file  Social History Narrative   Independent at baseline. Has a cane that he uses occasionally. Lives at home with his wife.   Social Determinants of Health   Financial Resource Strain: Not on file  Food Insecurity: Not on file  Transportation Needs: Not on file  Physical Activity: Not on file  Stress: Not on file  Social Connections: Not on file   Family History   Problem Relation Age of Onset  . CAD Mother   . CVA Mother   . Heart disease Mother   . Melanoma Father   . CVA Father   . Liver disease Brother   . Breast cancer Paternal Aunt   . Alcohol abuse Maternal Grandfather    Scheduled Meds: . amLODipine  5 mg Oral Daily  . aspirin EC  81 mg Oral Daily  . atorvastatin  20 mg Oral Daily  . busPIRone  7.5 mg Oral BID  . lactulose  13.3333 g Oral BID  . pantoprazole  40 mg Oral Daily  . potassium chloride  20 mEq Oral BID  . propranolol  10 mg Oral BID  . rifaximin  550 mg Oral BID  . sertraline  25 mg Oral Daily  . sodium chloride flush  3 mL Intravenous Q12H  . sodium chloride flush  3 mL Intravenous Q12H  . spironolactone  100 mg Oral BID   Continuous Infusions: . sodium chloride    . furosemide (LASIX)  200 mg in dextrose 5% 100 mL (57m/mL) infusion 4 mg/hr (04/04/20 1706)   PRN Meds:.sodium chloride, acetaminophen, acetaminophen, albuterol, hydrOXYzine, nitroGLYCERIN, polyvinyl alcohol, sodium chloride flush Allergies  Allergen Reactions  . Bee Venom Anaphylaxis  . Sulfa Antibiotics Other (See Comments)    Reaction:  Fainting   . Duloxetine Nausea Only   Review of Systems  Constitutional: Positive for activity change, appetite change and unexpected weight change.  Cardiovascular: Positive for leg swelling.  Gastrointestinal: Positive for abdominal distention.  Neurological: Positive for weakness.    Physical Exam Constitutional:      General: He is not in acute distress. Pulmonary:     Effort: Pulmonary effort is normal.  Musculoskeletal:     Right lower leg: Edema present.     Left lower leg: Edema present.  Skin:    General: Skin is warm and dry.  Neurological:     Mental Status: He is alert and oriented to person, place, and time.     Vital Signs: BP 111/64 (BP Location: Right Arm)   Pulse 66   Temp 98.2 F (36.8 C) (Oral)   Resp 18   Ht 5' 5"  (1.651 m)   Wt 88.6 kg   SpO2 100%   BMI 32.50 kg/m   Pain Scale: 0-10   Pain Score: 0-No pain   SpO2: SpO2: 100 % O2 Device:SpO2: 100 % O2 Flow Rate: .   IO: Intake/output summary:   Intake/Output Summary (Last 24 hours) at 04/05/2020 1446 Last data filed at 04/05/2020 1330 Gross per 24 hour  Intake 563 ml  Output 750 ml  Net -187 ml    LBM: Last BM Date: 04/05/20 Baseline Weight: Weight: 95.8 kg Most recent weight: Weight: 88.6 kg     Palliative Assessment/Data: PPS 60%    Time Total: 65 minutes Greater than 50%  of this time was spent counseling and coordinating care related to the above assessment and plan.  SJuel Burrow DNP, AGNP-C Palliative Medicine Team [phone]: [phone]

## 2020-04-05 NOTE — Progress Notes (Signed)
[name], MD [address]  [place]  [place], [place] [address]  Main: [phone]  Fax: [phone] Pager: [phone]   Subjective: Patient reports feeling better today, his abdominal distention has improved after paracentesis yesterday.  4 L of fluid was removed.  He also reports that his swelling of legs has improved in the morning, noticed more swelling later in the day.  He is keeping his legs elevated.  Tolerating Lasix drip well   Objective: Vital signs in last 24 hours: Vitals:   04/04/20 2100 04/05/20 0434 04/05/20 0500 04/05/20 1500  BP: 110/64 111/64  125/61  Pulse: 70 66  64  Resp: 18 18  18   Temp: 98.8 F (37.1 C) 98.2 F (36.8 C)  98.3 F (36.8 C)  TempSrc: Oral Oral    SpO2: 99% 100%    Weight:   88.6 kg   Height:       Weight change: 0.33 kg  Intake/Output Summary (Last 24 hours) at 04/05/2020 1739 Last data filed at 04/05/2020 1330 Gross per 24 hour  Intake 563 ml  Output 750 ml  Net -187 ml     Exam: Heart:: Regular rate and rhythm, S1S2 present or without murmur or extra heart sounds Lungs: normal and clear to auscultation Abdomen: Soft, mildly distended, not tense compared to yesterday, nontender 2+ edema in bilateral lower extremities   Lab Results: CBC Latest Ref Rng & Units 04/03/2020 04/02/2020 04/01/2020  WBC 4.0 - 10.5 K/uL 4.6 8.5 7.7  Hemoglobin 13.0 - 17.0 g/dL 10.0(L) 10.1(L) 10.3(L)  Hematocrit 39.0 - 52.0 % 29.4(L) 29.9(L) 29.9(L)  Platelets 150 - 400 K/uL 120(L) 141(L) 141(L)   CMP Latest Ref Rng & Units 04/05/2020 04/04/2020 04/03/2020  Glucose 70 - 99 mg/dL 98 107(H) 103(H)  BUN 8 - 23 mg/dL 13 13 14   Creatinine 0.61 - 1.24 mg/dL 1.04 0.94 1.12  Sodium 135 - 145 mmol/L 139 139 140  Potassium 3.5 - 5.1 mmol/L 3.0(L) 3.8 3.5  Chloride 98 - 111 mmol/L 104 108 109  CO2 22 - 32 mmol/L 27 27 24   Calcium 8.9 - 10.3 mg/dL 8.4(L) 8.4(L) 8.4(L)  Total Protein 6.5 - 8.1 g/dL - 5.6(L) 6.3(L)  Total Bilirubin 0.3 - 1.2  mg/dL - 1.7(H) 2.1(H)  Alkaline Phos 38 - 126 U/L - 79 82  AST 15 - 41 U/L - 40 49(H)  ALT 0 - 44 U/L - 24 26    Micro Results: Recent Results (from the past 240 hour(s))  Fungus Culture With Stain     Status: None (Preliminary result)   Collection Time: 04/02/20  2:45 PM   Specimen: PATH Cytology Peritoneal fluid  Result Value Ref Range Status   Fungus Stain Final report  Final    Comment: (NOTE) Performed At: [facility] [address] [place], [place] [id number] Rush Farmer MD [id number]    Fungus (Mycology) Culture PENDING  Incomplete   Fungal Source PERITONEAL  Final    Comment: Performed at [facility], [address] [place], [place] [address]  Aerobic/Anaerobic Culture w Gram Stain (surgical/deep wound)     Status: None (Preliminary result)   Collection Time: 04/02/20  2:45 PM   Specimen: PATH Cytology Peritoneal fluid  Result Value Ref Range Status   Specimen Description   Final    PERITONEAL Performed at [facility], [address] [place], [place] [address]    Special Requests   Final    NONE Performed at [facility], [place], [place] [address]    Gram Stain NO WBC SEEN NO ORGANISMS SEEN   Final   Culture   Final    NO GROWTH 3 DAYS NO ANAEROBES ISOLATED; CULTURE IN PROGRESS FOR 5 DAYS Performed at [facility] [address], [place], [place] [address]    Report Status PENDING  Incomplete  Fungus Culture Result     Status: None   Collection Time: 04/02/20  2:45 PM  Result Value Ref Range Status   Result 1 Comment  Final    Comment: (NOTE) KOH/Calcofluor preparation:  no fungus observed. Performed At: [facility] [place], [place] [id number] Rush Farmer MD WL:[id number]    Studies/Results: US Paracentesis  Result Date: 04/05/2020 INDICATION: History of alcoholic cirrhosis with recurrent symptomatic ascites. Please perform ultrasound-guided paracentesis  for therapeutic purposes. EXAM: ULTRASOUND-GUIDED PARACENTESIS COMPARISON:  Ultrasound-guided paracentesis-04/02/2020 MEDICATIONS: None. COMPLICATIONS: None immediate. TECHNIQUE: Informed written consent was obtained from the patient after a discussion of the risks, benefits and alternatives to treatment. A timeout was performed prior to the initiation of the procedure. Initial ultrasound scanning demonstrates a large amount of ascites within the right lower abdomen which was subsequently prepped and draped in the usual sterile fashion. 1% lidocaine with epinephrine was used for local anesthesia. Under direct ultrasound guidance, a 19 gauge, 7-cm, Yueh catheter was introduced. An ultrasound image was saved for documentation purposed. The paracentesis was performed. The catheter was removed and a dressing was applied. The patient tolerated the procedure well without immediate post procedural complication. FINDINGS: A total of approximately 4.8 liters of serous fluid was removed. IMPRESSION: Successful ultrasound-guided paracentesis yielding 4.8 liters of peritoneal fluid. Electronically Signed   By: Sandi Mariscal M.D.   On: 04/05/2020 07:29   Medications:  I have reviewed the patient's current medications. Prior to Admission:  Medications Prior to Admission  Medication Sig Dispense Refill Last Dose  . amLODipine (NORVASC) 5 MG tablet Take 5 mg by mouth daily.   03/31/2020 at 0900  . atorvastatin (LIPITOR) 40 MG tablet Take 40 mg by mouth daily.   03/31/2020 at 2100  . busPIRone (BUSPAR) 7.5 MG tablet Take 2 tablets by mouth in the morning and at bedtime.   03/31/2020 at 2100  . furosemide (LASIX) 40 MG tablet Take 40 mg by mouth daily.   03/31/2020 at 0900  . hydrOXYzine (ATARAX/VISTARIL) 10 MG tablet Take 10 mg by mouth 3 (three) times daily as needed for itching.   03/31/2020 at 2100  . Lactulose 20 GM/30ML SOLN Take 20 mLs (13.3333 g total) by mouth in the morning and at bedtime. 1200 mL 5 03/31/2020 at 2100  .  losartan (COZAAR) 25 MG tablet Take 25 mg by mouth daily.   03/31/2020 at 0900  . pantoprazole (PROTONIX) 40 MG tablet Take 1 tablet (40 mg total) by mouth daily. 90 tablet 2 03/31/2020 at 0900  . propranolol (INDERAL) 10 MG tablet Take 10 mg by mouth 2 (two) times daily.   03/31/2020 at 2100  . sertraline (ZOLOFT) 25 MG tablet Take 25 mg by mouth daily.   03/31/2020 at 0900  . spironolactone (ALDACTONE) 50 MG tablet Take 50 mg by mouth daily.   03/31/2020 at 0900  . rifaximin (XIFAXAN) 550 MG TABS tablet Take 1 tablet (550 mg total) by mouth 2 (two) times daily. (Patient not taking: No sig reported) 180 tablet 3 Not Taking at Unknown time  . tiZANidine (ZANAFLEX) 2 MG tablet Take  2 mg by mouth 3 (three) times daily.   unknown at prn  . VENTOLIN HFA 108 (90 Base) MCG/ACT inhaler Inhale 2 puffs into the lungs every 6 (six) hours as needed for wheezing or shortness of breath.    unknown at prn   Scheduled: . amLODipine  5 mg Oral Daily  . aspirin EC  81 mg Oral Daily  . atorvastatin  20 mg Oral Daily  . busPIRone  7.5 mg Oral BID  . lactulose  13.3333 g Oral BID  . pantoprazole  40 mg Oral Daily  . potassium chloride  20 mEq Oral BID  . propranolol  10 mg Oral BID  . rifaximin  550 mg Oral BID  . sertraline  25 mg Oral Daily  . sodium chloride flush  3 mL Intravenous Q12H  . sodium chloride flush  3 mL Intravenous Q12H  . spironolactone  100 mg Oral BID   Continuous: . sodium chloride    . furosemide (LASIX) 200 mg in dextrose 5% 100 mL (2mg /mL) infusion 4 mg/hr (04/04/20 1706)   ZOX:WRUEAV chloride, acetaminophen, acetaminophen, albuterol, hydrOXYzine, nitroGLYCERIN, polyvinyl alcohol, sodium chloride flush Anti-infectives (From admission, onward)   Start     Dose/Rate Route Frequency Ordered Stop   04/01/20 1245  rifaximin (XIFAXAN) tablet 550 mg        550 mg Oral 2 times daily 04/01/20 1235       Scheduled Meds: . amLODipine  5 mg Oral Daily  . aspirin EC  81 mg Oral Daily  .  atorvastatin  20 mg Oral Daily  . busPIRone  7.5 mg Oral BID  . lactulose  13.3333 g Oral BID  . pantoprazole  40 mg Oral Daily  . potassium chloride  20 mEq Oral BID  . propranolol  10 mg Oral BID  . rifaximin  550 mg Oral BID  . sertraline  25 mg Oral Daily  . sodium chloride flush  3 mL Intravenous Q12H  . sodium chloride flush  3 mL Intravenous Q12H  . spironolactone  100 mg Oral BID   Continuous Infusions: . sodium chloride    . furosemide (LASIX) 200 mg in dextrose 5% 100 mL (2mg /mL) infusion 4 mg/hr (04/04/20 1706)   PRN Meds:.sodium chloride, acetaminophen, acetaminophen, albuterol, hydrOXYzine, nitroGLYCERIN, polyvinyl alcohol, sodium chloride flush   Assessment: Principal Problem:   NSTEMI (non-ST elevated myocardial infarction) [facility]) Active Problems:   Rectal bleeding   Depression   GERD (gastroesophageal reflux disease)   Alcoholic cirrhosis of liver with ascites (HCC)  Plan: Decompensated alcoholic cirrhosis with volume overload Volume overload presenting as ascites and swelling of legs Ultrasound Dopplers revealed 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  Patient is responding well to Lasix drip.   Continue spironolactone 100 mg twice daily Monitor renal function and electrolytes closely, strict ins and outs, daily weights Continue lactulose and rifaximin   LOS: 4 days   Rohini Vanga 04/05/2020, 5:39 PM

## 2020-04-05 NOTE — Progress Notes (Signed)
Received report from [place]

## 2020-04-05 NOTE — Progress Notes (Signed)
[facility]) Hospital Liaison RN note:  Received new referral for [facility] out patient palliative program to follow post discharge from Kathie Rhodes, NP. Patient information given to referral. Donnetta Hutching, TOC is aware. Plan is for discharge to sister's home when medically ready. [place] Liaison will follow for disposition.  Thank you for this referral.  Zandra Abts, RN [facility] Liaison [phone]

## 2020-04-05 NOTE — Progress Notes (Signed)
[facility] [place] NAME: Jeff Ward    MR#:  [id number]  DATE OF BIRTH:  [birth year]  SUBJECTIVE:  son in the room. Patient patient complains of leg swelling. Overall feels better after removal of fluid from the belly yesterday. Tolerating PO diet.  REVIEW OF SYSTEMS:   Review of Systems  Constitutional: Negative for chills, fever and weight loss.  HENT: Negative for ear discharge, ear pain and nosebleeds.   Eyes: Negative for blurred vision, pain and discharge.  Respiratory: Negative for sputum production, shortness of breath, wheezing and stridor.   Cardiovascular: Positive for leg swelling. Negative for chest pain, palpitations, orthopnea and PND.  Gastrointestinal: Negative for abdominal pain, diarrhea, nausea and vomiting.       Abdominal distention/tightness  Genitourinary: Negative for frequency and urgency.  Musculoskeletal: Negative for back pain and joint pain.  Neurological: Negative for sensory change, speech change, focal weakness and weakness.  Psychiatric/Behavioral: Negative for depression and hallucinations. The patient is not nervous/anxious.    Tolerating Diet:yesTolerating PT: ambulating with his cane in the hallways  DRUG ALLERGIES:   Allergies  Allergen Reactions   Bee Venom Anaphylaxis   Sulfa Antibiotics Other (See Comments)    Reaction:  Fainting    Duloxetine Nausea Only    VITALS:  Blood pressure 111/64, pulse 66, temperature 98.2 F (36.8 C), temperature source Oral, resp. rate 18, height 5\' 5"  (1.651 m), weight 88.6 kg, SpO2 100 %.  PHYSICAL EXAMINATION:   Physical Exam  GENERAL:  70 y.o.-year-old patient lying in the bed with no acute distress.  LUNGS: Normal breath sounds bilaterally, no wheezing, rales, rhonchi. No use of accessory muscles of respiration.  CARDIOVASCULAR: S1, S2 normal. No murmurs, rubs, or gallops.  ABDOMEN: nontender, soft today, no fluid thrill EXTREMITIES: 3+ edema     NEUROLOGIC: Cranial nerves II through XII are intact. No focal Motor or sensory deficits b/l.   PSYCHIATRIC:  patient is alert and oriented x 3.  SKIN: No obvious rash, lesion, or ulcer.   LABORATORY PANEL:  CBC Recent Labs  Lab 04/03/20 0531  WBC 4.6  HGB 10.0*  HCT 29.4*  PLT 120*    Chemistries  Recent Labs  Lab 04/03/20 0531 04/04/20 0653 04/05/20 0614  NA 140 139 139  K 3.5 3.8 3.0*  CL 109 108 104  CO2 24 27 27   GLUCOSE 103* 107* 98  BUN 14 13 13   CREATININE 1.12 0.94 1.04  CALCIUM 8.4* 8.4* 8.4*  MG 2.0  --   --   AST 49* 40  --   ALT 26 24  --   ALKPHOS 82 79  --   BILITOT 2.1* 1.7*  --    Cardiac Enzymes No results for input(s): TROPONINI in the last 168 hours. RADIOLOGY:  US Paracentesis  Result Date: 04/05/2020 INDICATION: History of alcoholic cirrhosis with recurrent symptomatic ascites. Please perform ultrasound-guided paracentesis for therapeutic purposes. EXAM: ULTRASOUND-GUIDED PARACENTESIS COMPARISON:  Ultrasound-guided paracentesis-04/02/2020 MEDICATIONS: None. COMPLICATIONS: None immediate. TECHNIQUE: Informed written consent was obtained from the patient after a discussion of the risks, benefits and alternatives to treatment. A timeout was performed prior to the initiation of the procedure. Initial ultrasound scanning demonstrates a large amount of ascites within the right lower abdomen which was subsequently prepped and draped in the usual sterile fashion. 1% lidocaine with epinephrine was used for local anesthesia. Under direct ultrasound guidance, a 19 gauge, 7-cm, Yueh catheter was introduced. An ultrasound image was saved  for documentation purposed. The paracentesis was performed. The catheter was removed and a dressing was applied. The patient tolerated the procedure well without immediate post procedural complication. FINDINGS: A total of approximately 4.8 liters of serous fluid was removed. IMPRESSION: Successful ultrasound-guided paracentesis  yielding 4.8 liters of peritoneal fluid. Electronically Signed   By: Sandi Mariscal M.D.   On: 04/05/2020 07:29   ASSESSMENT AND PLAN:  Jeff Ward is a 70 y.o. male with medical history significant for alcoholic liver cirrhosis, GERD, dyslipidemia, depression who presents to the ER for evaluation of shortness of breath.  Decompensated alcoholic cirrhosis of liver with tense ascites: --Multiple attempts to optimize diuretic therapy and difficulty tolerating.  --Presents with anasarca and tense ascites. --3/8  First Paracentesis -->  7 L removed. No SBP --Resume Lasix 40 mg daily, Aldactone 100 mg daily, rifaximin , lactulose --Patient with known cirrhosis, currently without active bleeding.  Not tolerated beta-blocker in the past. --continue Protonix 40 mg twice a day today. --Followed by GI Dr Waldron Session ultrasound with equivocal finding of portal thrombosis. --3/9--- D/w GI and  Recommended continued IV lasix gtt  to  optimize his fluid status before discharge. --will repeat Paracentesis today --3/10--s/p repeat paracentesis with 4 L removed, continue IV Lasix drip. Good urine output. Patient overall feels better.  Elevated troponin  suspected non-ST elevation MI: Ruled out.  -- Cardiac cath with normal coronaries.  His symptoms are mostly related to decompensated liver failure and ascites. --Continue aspirin.  Statin  -- LFTs  normal.    Essential hypertension: Blood pressure stable on current regimen.   Depression: Stable.  On home medications BuSpar and Zoloft.   DVT prophylaxis: SCDs  patient has multiple comorbidities and high 12 year mortality index. Will get palliative care discuss goals of care and end-of-life issues.  Code Status: Full code Family Communication:  son at bdeside Disposition Plan: Status is: Inpatient  Remains inpatient appropriate because:IV treatments appropriate due to intensity of illness or inability to take PO and Inpatient level of care  appropriate due to severity of illness   Dispo: The patient is from: Home  Anticipated d/c is to: Home in 1 to 2 days  Patient currently is not medically stable to d/c.              Difficult to place patient No  patient overall improving with his leg edema and volume status with IV Lasix drip. Will continue for another 24 hours Lasix drip. Palliative care to meet with patient today        TOTAL TIME TAKING CARE OF THIS PATIENT: *25* minutes.  >50% time spent on counselling and coordination of care  Note: This dictation was prepared with Dragon dictation along with smaller phrase technology. Any transcriptional errors that result from this process are unintentional.  Fritzi Mandes M.D    [facility]   CC: Primary care physician; Adin Hector, MDPatient ID: [name], male   DOB: 03-20-1950, 70 y.o.   MRN: [id number]

## 2020-04-06 ENCOUNTER — Inpatient Hospital Stay: Payer: Medicare HMO

## 2020-04-06 DIAGNOSIS — I214 Non-ST elevation (NSTEMI) myocardial infarction: Secondary | ICD-10-CM | POA: Diagnosis not present

## 2020-04-06 DIAGNOSIS — K7031 Alcoholic cirrhosis of liver with ascites: Secondary | ICD-10-CM | POA: Diagnosis not present

## 2020-04-06 LAB — POTASSIUM: Potassium: 3.8 mmol/L (ref 3.5–5.1)

## 2020-04-06 MED ORDER — SPIRONOLACTONE 50 MG PO TABS: 150.0000 mg | ORAL_TABLET | Freq: Every day | ORAL | 3 refills | Status: AC

## 2020-04-06 MED ORDER — GADOBUTROL 1 MMOL/ML IV SOLN
7.5000 mL | Freq: Once | INTRAVENOUS | Status: AC | PRN
  Administered 2020-04-06: 7.5 mL via INTRAVENOUS

## 2020-04-06 MED ORDER — FUROSEMIDE 20 MG PO TABS
60.0000 mg | ORAL_TABLET | Freq: Every day | ORAL | 2 refills | Status: AC
Start: 2020-04-06 — End: ?

## 2020-04-06 MED ORDER — LACTULOSE 10 GM/15ML PO SOLN: 13.3333 g | Freq: Every day | ORAL | Status: DC

## 2020-04-06 MED ORDER — RIFAXIMIN 550 MG PO TABS: 550.0000 mg | ORAL_TABLET | Freq: Two times a day (BID) | ORAL | 4 refills | Status: DC

## 2020-04-06 MED ORDER — ASPIRIN 81 MG PO TBEC: 81.0000 mg | DELAYED_RELEASE_TABLET | Freq: Every day | ORAL | 11 refills | Status: DC

## 2020-04-06 NOTE — Care Management Important Message (Signed)
Important Message  Patient Details  Name: Jeff Ward MRN: [id number] Date of Birth: 09/18/1950   Medicare Important Message Given:  Yes     Juliann Pulse A Kimeka Badour 04/06/2020, 2:07 PM

## 2020-04-06 NOTE — Progress Notes (Signed)
[name], MD [address]  [place]  [place], [place] [address]  Main: [phone]  Fax: [phone] Pager: [phone]   Subjective: Patient continues to feel better.  He reports that his swelling of legs has significantly improved.  Paracentesis was attempted today, fluid was not enough to be tapped.  He has been tolerating p.o. well.   Objective: Vital signs in last 24 hours: Vitals:   04/05/20 2153 04/06/20 0709 04/06/20 0800 04/06/20 1203  BP: 129/60 130/71 127/75 (!) 119/58  Pulse: 64 67 67 (!) 59  Resp: 18 17 16 16   Temp: 98.2 F (36.8 C) (!) 97.4 F (36.3 C) 98.4 F (36.9 C) 98.4 F (36.9 C)  TempSrc: Oral  Oral Oral  SpO2: 100% 98% 98% 97%  Weight:      Height:       Weight change:   Intake/Output Summary (Last 24 hours) at 04/06/2020 1402 Last data filed at 04/06/2020 0500 Gross per 24 hour  Intake 561.92 ml  Output 200 ml  Net 361.92 ml     Exam: Heart:: Regular rate and rhythm, S1S2 present or without murmur or extra heart sounds Lungs: normal and clear to auscultation Abdomen: Soft, mildly distended, not tense compared to yesterday, nontender 1+ edema in bilateral lower extremities   Lab Results: CBC Latest Ref Rng & Units 04/03/2020 04/02/2020 04/01/2020  WBC 4.0 - 10.5 K/uL 4.6 8.5 7.7  Hemoglobin 13.0 - 17.0 g/dL 10.0(L) 10.1(L) 10.3(L)  Hematocrit 39.0 - 52.0 % 29.4(L) 29.9(L) 29.9(L)  Platelets 150 - 400 K/uL 120(L) 141(L) 141(L)   CMP Latest Ref Rng & Units 04/06/2020 04/05/2020 04/04/2020  Glucose 70 - 99 mg/dL - 98 107(H)  BUN 8 - 23 mg/dL - 13 13  Creatinine 0.61 - 1.24 mg/dL - 1.04 0.94  Sodium 135 - 145 mmol/L - 139 139  Potassium 3.5 - 5.1 mmol/L 3.8 3.0(L) 3.8  Chloride 98 - 111 mmol/L - 104 108  CO2 22 - 32 mmol/L - 27 27  Calcium 8.9 - 10.3 mg/dL - 8.4(L) 8.4(L)  Total Protein 6.5 - 8.1 g/dL - - 5.6(L)  Total Bilirubin 0.3 - 1.2 mg/dL - - 1.7(H)  Alkaline Phos 38 - 126 U/L - - 79  AST 15 - 41 U/L - - 40  ALT 0 -  44 U/L - - 24    Micro Results: Recent Results (from the past 240 hour(s))  Fungus Culture With Stain     Status: None (Preliminary result)   Collection Time: 04/02/20  2:45 PM   Specimen: PATH Cytology Peritoneal fluid  Result Value Ref Range Status   Fungus Stain Final report  Final    Comment: (NOTE) Performed At: [facility] [address], [place] [id number] Rush Farmer MD [id number]    Fungus (Mycology) Culture PENDING  Incomplete   Fungal Source PERITONEAL  Final    Comment: Performed at [facility], [address] [place], [place] [address]  Aerobic/Anaerobic Culture w Gram Stain (surgical/deep wound)     Status: None (Preliminary result)   Collection Time: 04/02/20  2:45 PM   Specimen: PATH Cytology Peritoneal fluid  Result Value Ref Range Status   Specimen Description   Final    PERITONEAL Performed at [facility], [address] [place], [place] [address]    Special Requests   Final    NONE Performed at [facility], [address], [place], [place] [address]    Gram Stain NO  WBC SEEN NO ORGANISMS SEEN   Final   Culture   Final    NO GROWTH 3 DAYS NO ANAEROBES ISOLATED; CULTURE IN PROGRESS FOR 5 DAYS Performed at [facility] [address] [place], [place] [address]    Report Status PENDING  Incomplete  Fungus Culture Result     Status: None   Collection Time: 04/02/20  2:45 PM  Result Value Ref Range Status   Result 1 Comment  Final    Comment: (NOTE) KOH/Calcofluor preparation:  no fungus observed. Performed At: [facility] [place], [place] [id number] Rush Farmer MD [id number]    Studies/Results: US Paracentesis  Result Date: 04/05/2020 INDICATION: History of alcoholic cirrhosis with recurrent symptomatic ascites. Please perform ultrasound-guided paracentesis for therapeutic purposes. EXAM: ULTRASOUND-GUIDED PARACENTESIS COMPARISON:  Ultrasound-guided  paracentesis-04/02/2020 MEDICATIONS: None. COMPLICATIONS: None immediate. TECHNIQUE: Informed written consent was obtained from the patient after a discussion of the risks, benefits and alternatives to treatment. A timeout was performed prior to the initiation of the procedure. Initial ultrasound scanning demonstrates a large amount of ascites within the right lower abdomen which was subsequently prepped and draped in the usual sterile fashion. 1% lidocaine with epinephrine was used for local anesthesia. Under direct ultrasound guidance, a 19 gauge, 7-cm, Yueh catheter was introduced. An ultrasound image was saved for documentation purposed. The paracentesis was performed. The catheter was removed and a dressing was applied. The patient tolerated the procedure well without immediate post procedural complication. FINDINGS: A total of approximately 4.8 liters of serous fluid was removed. IMPRESSION: Successful ultrasound-guided paracentesis yielding 4.8 liters of peritoneal fluid. Electronically Signed   By: Sandi Mariscal M.D.   On: 04/05/2020 07:29   [place] ASCITES (ABDOMEN LIMITED)  Result Date: 04/06/2020 CLINICAL DATA:  70 year old gentleman presented to interventional radiology for paracentesis. EXAM: LIMITED ABDOMEN ULTRASOUND FOR ASCITES TECHNIQUE: Limited ultrasound survey for ascites was performed in all four abdominal quadrants. COMPARISON:  04/04/2020 FINDINGS: Minimal ascites, adjacent to the liver. IMPRESSION: Minimal abdominal ascites, insufficient for paracentesis. Electronically Signed   By: Miachel Roux M.D.   On: 04/06/2020 11:39   Medications:  I have reviewed the patient's current medications. Prior to Admission:  Medications Prior to Admission  Medication Sig Dispense Refill Last Dose  . amLODipine (NORVASC) 5 MG tablet Take 5 mg by mouth daily.   03/31/2020 at 0900  . atorvastatin (LIPITOR) 40 MG tablet Take 40 mg by mouth daily.   03/31/2020 at 2100  . busPIRone (BUSPAR) 7.5 MG tablet Take 2  tablets by mouth in the morning and at bedtime.   03/31/2020 at 2100  . hydrOXYzine (ATARAX/VISTARIL) 10 MG tablet Take 10 mg by mouth 3 (three) times daily as needed for itching.   03/31/2020 at 2100  . Lactulose 20 GM/30ML SOLN Take 20 mLs (13.3333 g total) by mouth in the morning and at bedtime. 1200 mL 5 03/31/2020 at 2100  . losartan (COZAAR) 25 MG tablet Take 25 mg by mouth daily.   03/31/2020 at 0900  . pantoprazole (PROTONIX) 40 MG tablet Take 1 tablet (40 mg total) by mouth daily. 90 tablet 2 03/31/2020 at 0900  . propranolol (INDERAL) 10 MG tablet Take 10 mg by mouth 2 (two) times daily.   03/31/2020 at 2100  . sertraline (ZOLOFT) 25 MG tablet Take 25 mg by mouth daily.   03/31/2020 at 0900  . [DISCONTINUED] furosemide (LASIX) 40 MG tablet Take 40 mg by mouth daily.   03/31/2020 at 0900  . [  DISCONTINUED] spironolactone (ALDACTONE) 50 MG tablet Take 50 mg by mouth daily.   03/31/2020 at 0900  . tiZANidine (ZANAFLEX) 2 MG tablet Take 2 mg by mouth 3 (three) times daily.   unknown at prn  . VENTOLIN HFA 108 (90 Base) MCG/ACT inhaler Inhale 2 puffs into the lungs every 6 (six) hours as needed for wheezing or shortness of breath.    unknown at prn  . [DISCONTINUED] rifaximin (XIFAXAN) 550 MG TABS tablet Take 1 tablet (550 mg total) by mouth 2 (two) times daily. (Patient not taking: No sig reported) 180 tablet 3 Not Taking at Unknown time   Scheduled: . amLODipine  5 mg Oral Daily  . aspirin EC  81 mg Oral Daily  . atorvastatin  20 mg Oral Daily  . busPIRone  7.5 mg Oral BID  . [START ON 04/07/2020] lactulose  13.3333 g Oral Daily  . pantoprazole  40 mg Oral Daily  . potassium chloride  20 mEq Oral BID  . propranolol  10 mg Oral BID  . rifaximin  550 mg Oral BID  . sertraline  25 mg Oral Daily  . sodium chloride flush  3 mL Intravenous Q12H  . sodium chloride flush  3 mL Intravenous Q12H  . spironolactone  100 mg Oral BID   Continuous: . sodium chloride     YHC:WCBJSE chloride, acetaminophen,  acetaminophen, albuterol, hydrOXYzine, nitroGLYCERIN, polyvinyl alcohol, sodium chloride flush Anti-infectives (From admission, onward)   Start     Dose/Rate Route Frequency Ordered Stop   04/06/20 0000  rifaximin (XIFAXAN) 550 MG TABS tablet        550 mg Oral 2 times daily 04/06/20 1315     04/01/20 1245  rifaximin (XIFAXAN) tablet 550 mg        550 mg Oral 2 times daily 04/01/20 1235       Scheduled Meds: . amLODipine  5 mg Oral Daily  . aspirin EC  81 mg Oral Daily  . atorvastatin  20 mg Oral Daily  . busPIRone  7.5 mg Oral BID  . [START ON 04/07/2020] lactulose  13.3333 g Oral Daily  . pantoprazole  40 mg Oral Daily  . potassium chloride  20 mEq Oral BID  . propranolol  10 mg Oral BID  . rifaximin  550 mg Oral BID  . sertraline  25 mg Oral Daily  . sodium chloride flush  3 mL Intravenous Q12H  . sodium chloride flush  3 mL Intravenous Q12H  . spironolactone  100 mg Oral BID   Continuous Infusions: . 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  Patient is responding well to Lasix drip.   Continue spironolactone 100 mg twice daily Recommend switching to  p.o. Lasix 60 mg daily along with spironolactone 150 mg daily  upon discharge Monitor renal function and electrolytes closely, strict ins and outs, daily weights Continue lactulose and rifaximin Patient can go home today Follow-up with me in 4 to 6 weeks for long-term care of cirrhosis  GI will sign off at this time, please call us back with questions or concerns    LOS: 5 days   Rohini Vanga 04/06/2020, 2:02 PM

## 2020-04-06 NOTE — Discharge Summary (Signed)
[facility] [place] NAME: Jeff Ward    MR#:  [id number]  DATE OF BIRTH:  06/25/1950  DATE OF ADMISSION:  04/01/2020 ADMITTING PHYSICIAN: Collier Bullock, MD  DATE OF DISCHARGE: 04/06/2020  PRIMARY CARE PHYSICIAN: Adin Hector, MD    ADMISSION DIAGNOSIS:  Shortness of breath [R06.02] Elevated troponin [R77.8] NSTEMI (non-ST elevated myocardial infarction) ([place]) [I21.4] Gastrointestinal hemorrhage, unspecified gastrointestinal hemorrhage type [K92.2]  DISCHARGE DIAGNOSIS:  Decompensated alcoholic cirrhosis of liver with ascites  SECONDARY DIAGNOSIS:   Past Medical History:  Diagnosis Date  . Allergy    SEASONAL  . Anemia   . Anxiety   . Arthritis    In back and all joints  . Asthma   . Chronic headache    now resolved  . CKD (chronic kidney disease)   . 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 Depression   . Esophageal varices ([place])   . GERD (gastroesophageal reflux disease)   . GI bleed   . Hiatal hernia   . History of kidney stones 1970's  . Hyperlipidemia 06/2002  . Hypertension   . Liver disease   . Psoriasis   . Stroke ([place])    PT. STATED MILD  . Substance abuse ([place])    H/O ETOH    HOSPITAL COURSE:  Jeff Ward a 70 y.o.malewith medical history significant foralcoholic liver cirrhosis, GERD, dyslipidemia, depression who presents to the ER for evaluation of shortness of breath.  Decompensated alcoholic cirrhosis of liver with tense ascites: --Multiple attempts to optimize diuretic therapy and difficulty tolerating. --Presents with anasarca and tense ascites. --3/8  First Paracentesis--> 7 L removed. No SBP --Resume Lasix 40 mg daily, Aldactone100mg  daily, rifaximin, lactulose --Patient with known cirrhosis, currently without active bleeding. Not tolerated beta-blocker in the past. --continue Protonix 40 mg twice a day today. --Followed by GI Dr Waldron Session ultrasound with  equivocal finding of portal thrombosis-- no indication for anticoagulation --3/9--- D/w GI and  Recommended continued IV lasix gtt  to  optimize his fluid status before discharge. --will repeat Paracentesis today --3/10--s/p repeat paracentesis with 4 L removed, continue IV Lasix drip. Good urine output. Patient overall feels better. --3/11-- Attempted US paracentesis--Not enough fluid to tap --MRI abd w and w/o contrast to evaluate portal vein thrombosis.-- Patient will follow-up closely with Dr. Lafonda [name]. as outpatient. He may be a candidate for TIPS. -- Patient will discharge on Lasix 60 mg daily and Spironolactone 150 mg daily. He'll continue lactulose and rifaximin as before.  Elevated troponin  suspected non-ST elevation MI: Ruled out.  --Cardiac cath with normal coronaries. His symptoms are mostly related to decompensated liver failure and ascites. --Continue aspirin andStatin  -- LFTs  normal.   Essential hypertension: Blood pressure stable on current regimen.   Depression: Stable. On home medications BuSpar and Zoloft.   DVT prophylaxis: SCDs  patient has multiple comorbidities and high 12 year mortality index. Will get palliative care discuss goals of care and end-of-life issues. Authora care to f/u as out pt  Code Status:Full code Family Communication: sister/friend at [facility] Disposition Plan:Status is: Inpatient   Dispo: The patient is from: Home Anticipated d/c is to: Home with [facility] Patient currently is  Medically optimized  to d/c. Difficult to place patient No  patient will discharge after MRI abdomen today.  CONSULTS OBTAINED:  Treatment Team:  Lin Landsman, MD  DRUG ALLERGIES:   Allergies  Allergen Reactions  . Bee  Venom Anaphylaxis  . Sulfa Antibiotics Other (See Comments)    Reaction:  Fainting   . Duloxetine Nausea Only    DISCHARGE MEDICATIONS:   Allergies as of 04/06/2020       Reactions   Bee Venom Anaphylaxis   Sulfa Antibiotics Other (See Comments)   Reaction:  Fainting    Duloxetine Nausea Only      Medication List    STOP taking these medications   losartan 25 MG tablet Commonly known as: COZAAR   tiZANidine 2 MG tablet Commonly known as: ZANAFLEX     TAKE these medications   amLODipine 5 MG tablet Commonly known as: NORVASC Take 5 mg by mouth daily.   aspirin 81 MG EC tablet Take 1 tablet (81 mg total) by mouth daily. Swallow whole. Start taking on: April 07, 2020   atorvastatin 40 MG tablet Commonly known as: LIPITOR Take 40 mg by mouth daily.   busPIRone 7.5 MG tablet Commonly known as: BUSPAR Take 2 tablets by mouth in the morning and at bedtime.   furosemide 20 MG tablet Commonly known as: LASIX Take 3 tablets (60 mg total) by mouth daily. What changed:  medication strength how much to take   hydrOXYzine 10 MG tablet Commonly known as: ATARAX/VISTARIL Take 10 mg by mouth 3 (three) times daily as needed for itching.   Lactulose 20 GM/30ML Soln Take 20 mLs (13.3333 g total) by mouth in the morning and at bedtime.   pantoprazole 40 MG tablet Commonly known as: PROTONIX Take 1 tablet (40 mg total) by mouth daily.   propranolol 10 MG tablet Commonly known as: INDERAL Take 10 mg by mouth 2 (two) times daily.   rifaximin 550 MG Tabs tablet Commonly known as: XIFAXAN Take 1 tablet (550 mg total) by mouth 2 (two) times daily.   sertraline 25 MG tablet Commonly known as: ZOLOFT Take 25 mg by mouth daily.   spironolactone 50 MG tablet Commonly known as: ALDACTONE Take 3 tablets (150 mg total) by mouth daily. What changed: how much to take   Ventolin HFA 108 (90 Base) MCG/ACT inhaler Generic drug: albuterol Inhale 2 puffs into the lungs every 6 (six) hours as needed for wheezing or shortness of breath.       If you experience worsening of your admission symptoms, develop shortness of breath, life threatening  emergency, suicidal or homicidal thoughts you must seek medical attention immediately by calling 911 or calling your MD immediately  if symptoms less severe.  You Must read complete instructions/literature along with all the possible adverse reactions/side effects for all the Medicines you take and that have been prescribed to you. Take any new Medicines after you have completely understood and accept all the possible adverse reactions/side effects.   Please note  You were cared for by a hospitalist during your hospital stay. If you have any questions about your discharge medications or the care you received while you were in the hospital after you are discharged, you can call the unit and asked to speak with the hospitalist on call if the hospitalist that took care of you is not available. Once you are discharged, your primary care physician will handle any further medical issues. Please note that NO REFILLS for any discharge medications will be authorized once you are discharged, as it is imperative that you return to your primary care physician (or establish a relationship with a primary care physician if you do not have one) for your aftercare needs so that  they can reassess your need for medications and monitor your lab values. Today   SUBJECTIVE   No new complaints. Bilateral feet swelling improving with IV lasix gtt  VITAL SIGNS:  Blood pressure (!) 119/58, pulse (!) 59, temperature 98.4 F (36.9 C), temperature source Oral, resp. rate 16, height 5\' 5"  (1.651 m), weight 88.6 kg, SpO2 97 %.  I/O:    Intake/Output Summary (Last 24 hours) at 04/06/2020 1316 Last data filed at 04/06/2020 0500 Gross per 24 hour  Intake 801.92 ml  Output 200 ml  Net 601.92 ml    PHYSICAL EXAMINATION:  GENERAL:  70 y.o.-year-old patient lying in the bed with no acute distress.  LUNGS: Normal breath sounds bilaterally, no wheezing, rales,rhonchi or crepitation. No use of accessory muscles of respiration.   CARDIOVASCULAR: S1, S2 normal. No murmurs, rubs, or gallops.  ABDOMEN: Soft, non-tender, distended. Bowel sounds present. EXTREMITIES: +pedal edema, no cyanosis, or clubbing.  NEUROLOGIC: Cranial nerves II through XII are intact. Muscle strength 5/5 in all extremities. Sensation intact. Gait not checked.  PSYCHIATRIC:  patient is alert and oriented x 3.  SKIN: No obvious rash, lesion, or ulcer.   DATA REVIEW:   CBC  Recent Labs  Lab 04/03/20 0531  WBC 4.6  HGB 10.0*  HCT 29.4*  PLT 120*    Chemistries  Recent Labs  Lab 04/03/20 0531 04/04/20 0653 04/05/20 0614 04/06/20 1111  NA 140 139 139  --   K 3.5 3.8 3.0* 3.8  CL 109 108 104  --   CO2 24 27 27   --   GLUCOSE 103* 107* 98  --   BUN 14 13 13   --   CREATININE 1.12 0.94 1.04  --   CALCIUM 8.4* 8.4* 8.4*  --   MG 2.0  --   --   --   AST 49* 40  --   --   ALT 26 24  --   --   ALKPHOS 82 79  --   --   BILITOT 2.1* 1.7*  --   --     Microbiology Results   Recent Results (from the past 240 hour(s))  Fungus Culture With Stain     Status: None (Preliminary result)   Collection Time: 04/02/20  2:45 PM   Specimen: PATH Cytology Peritoneal fluid  Result Value Ref Range Status   Fungus Stain Final report  Final    Comment: (NOTE) Performed At: [facility] [address] [place], [place] [id number] Rush Farmer MD [id number]    Fungus (Mycology) Culture PENDING  Incomplete   Fungal Source PERITONEAL  Final    Comment: Performed at [facility], [address], [place], [place] [address]  Aerobic/Anaerobic Culture w Gram Stain (surgical/deep wound)     Status: None (Preliminary result)   Collection Time: 04/02/20  2:45 PM   Specimen: PATH Cytology Peritoneal fluid  Result Value Ref Range Status   Specimen Description   Final    PERITONEAL Performed at [facility], [address] [place], [place] [address]    Special Requests   Final    NONE Performed at [facility],  [address] [place], [place] [address]    Gram Stain NO WBC SEEN NO ORGANISMS SEEN   Final   Culture   Final    NO GROWTH 3 DAYS NO ANAEROBES ISOLATED; CULTURE IN PROGRESS FOR 5 DAYS Performed at [facility], [address] [place], [place] [address]  Report Status PENDING  Incomplete  Fungus Culture Result     Status: None   Collection Time: 04/02/20  2:45 PM  Result Value Ref Range Status   Result 1 Comment  Final    Comment: (NOTE) KOH/Calcofluor preparation:  no fungus observed. Performed At: [facility] [place], [place] [id number] Rush Farmer MD [id number]     RADIOLOGY:  US Paracentesis  Result Date: 04/05/2020 INDICATION: History of alcoholic cirrhosis with recurrent symptomatic ascites. Please perform ultrasound-guided paracentesis for therapeutic purposes. EXAM: ULTRASOUND-GUIDED PARACENTESIS COMPARISON:  Ultrasound-guided paracentesis-04/02/2020 MEDICATIONS: None. COMPLICATIONS: None immediate. TECHNIQUE: Informed written consent was obtained from the patient after a discussion of the risks, benefits and alternatives to treatment. A timeout was performed prior to the initiation of the procedure. Initial ultrasound scanning demonstrates a large amount of ascites within the right lower abdomen which was subsequently prepped and draped in the usual sterile fashion. 1% lidocaine with epinephrine was used for local anesthesia. Under direct ultrasound guidance, a 19 gauge, 7-cm, Yueh catheter was introduced. An ultrasound image was saved for documentation purposed. The paracentesis was performed. The catheter was removed and a dressing was applied. The patient tolerated the procedure well without immediate post procedural complication. FINDINGS: A total of approximately 4.8 liters of serous fluid was removed. IMPRESSION: Successful ultrasound-guided paracentesis yielding 4.8 liters of peritoneal fluid. Electronically Signed   By: Sandi Mariscal  M.D.   On: 04/05/2020 07:29   [place] ASCITES (ABDOMEN LIMITED)  Result Date: 04/06/2020 CLINICAL DATA:  70 year old gentleman presented to interventional radiology for paracentesis. EXAM: LIMITED ABDOMEN ULTRASOUND FOR ASCITES TECHNIQUE: Limited ultrasound survey for ascites was performed in all four abdominal quadrants. COMPARISON:  04/04/2020 FINDINGS: Minimal ascites, adjacent to the liver. IMPRESSION: Minimal abdominal ascites, insufficient for paracentesis. Electronically Signed   By: Miachel Roux M.D.   On: 04/06/2020 11:39     CODE STATUS:     Code Status Orders  (From admission, onward)         Start     Ordered   04/01/20 1234  Full code  Continuous        04/01/20 1235        Code Status History    Date Active Date Inactive Code Status Order ID Comments User Context   03/01/2020 0000 03/01/2020 2327 Full Code [id number]  Toy Baker, MD ED   11/23/2015 1846 11/24/2015 1446 Full Code [id number]  Gladstone Lighter, MD Inpatient   07/24/2015 0148 07/30/2015 1933 Full Code [id number]  Quintella Baton, MD Inpatient   Advance Care Planning Activity    Advance Directive Documentation   Flowsheet Row Most Recent Value  Type of Advance Directive Healthcare Power of Attorney, Living will  Pre-existing out of facility DNR order (yellow form or pink MOST form) --  "MOST" Form in Place? --       TOTAL TIME TAKING CARE OF THIS PATIENT: *35* minutes.    Fritzi Mandes M.D  [facility]    CC: Primary care physician; Adin Hector, MD

## 2020-04-06 NOTE — Progress Notes (Signed)
Patient presented to [facility] [place] department for an image-guided paracentesis. Patient underwent paracentesis on 3/10 with 4.8 L removed and on 3/7 with 7 L removed. Limited ultrasound examination today does not demonstrate sufficient fluid for a paracentesis. Images have been saved in Epic. No procedure done today; Dr. Dwaine Gale notified.  Please call IR with any questions.   Soyla Dryer, [place] [phone] 04/06/2020, 11:53 AM

## 2020-04-06 NOTE — Progress Notes (Signed)
Nutrition Brief Note  RD working remotely.   Nutrition Education Note  RD consulted for nutrition education regarding Low Sodium diet   RD also consulted on 3/9 for Low Sodium diet education. Patient seen by this RD at that time. Education completed at bedside with patient and family present in room, handouts provided and all questions answered. Please see RD note on 3/9.  No further nutrition interventions warranted at this time.If additional nutrition issues arise, please re-consult RD.   Lajuan Lines, RD, LDN Clinical Nutrition After Hours/Weekend Pager # in [phone]

## 2020-04-06 NOTE — Clinical Social Work Note (Addendum)
CSW acknowledges consult regarding outpatient palliative referral and patient requesting home health. Palliative referral was made to [facility] yesterday per chart review. Patient does not appear to have any home health needs. No wounds care needs, per MD he walks well by himself with a cane.  Dayton Scrape, CSW [phone]  1:32 pm: Patient has orders to discharge home today. Zandra Abts, RN with [facility] is aware. No further concerns. CSW signing off.  Dayton Scrape, [place]

## 2020-04-06 NOTE — Discharge Instructions (Signed)
[facility] to follow as outpatient

## 2020-04-06 NOTE — Progress Notes (Signed)
Discharge instructions given. Patient verbalized understanding and all questions were answered.  ?

## 2020-04-07 LAB — AEROBIC/ANAEROBIC CULTURE W GRAM STAIN (SURGICAL/DEEP WOUND)
Culture: NO GROWTH
Gram Stain: NONE SEEN

## 2020-04-10 DIAGNOSIS — R69 Illness, unspecified: Secondary | ICD-10-CM | POA: Diagnosis not present

## 2020-04-10 DIAGNOSIS — R601 Generalized edema: Secondary | ICD-10-CM | POA: Diagnosis not present

## 2020-04-10 DIAGNOSIS — E7849 Other hyperlipidemia: Secondary | ICD-10-CM | POA: Diagnosis not present

## 2020-04-10 DIAGNOSIS — I8510 Secondary esophageal varices without bleeding: Secondary | ICD-10-CM | POA: Diagnosis not present

## 2020-04-10 DIAGNOSIS — I129 Hypertensive chronic kidney disease with stage 1 through stage 4 chronic kidney disease, or unspecified chronic kidney disease: Secondary | ICD-10-CM | POA: Diagnosis not present

## 2020-04-10 DIAGNOSIS — N183 Chronic kidney disease, stage 3 unspecified: Secondary | ICD-10-CM | POA: Diagnosis not present

## 2020-04-10 DIAGNOSIS — D472 Monoclonal gammopathy: Secondary | ICD-10-CM | POA: Diagnosis not present

## 2020-04-10 DIAGNOSIS — R739 Hyperglycemia, unspecified: Secondary | ICD-10-CM | POA: Diagnosis not present

## 2020-04-10 DIAGNOSIS — D696 Thrombocytopenia, unspecified: Secondary | ICD-10-CM | POA: Diagnosis not present

## 2020-04-13 ENCOUNTER — Emergency Department: Payer: Medicare HMO

## 2020-04-13 ENCOUNTER — Other Ambulatory Visit: Payer: Self-pay

## 2020-04-13 ENCOUNTER — Inpatient Hospital Stay
Admission: EM | Admit: 2020-04-13 | Discharge: 2020-04-15 | DRG: 432 | Disposition: A | Discharge status: Home / Self Care | Payer: Medicare HMO | Attending: Internal Medicine | Admitting: Internal Medicine

## 2020-04-13 ENCOUNTER — Inpatient Hospital Stay: Payer: Medicare HMO

## 2020-04-13 DIAGNOSIS — Z91030 Bee allergy status: Secondary | ICD-10-CM

## 2020-04-13 DIAGNOSIS — Z7982 Long term (current) use of aspirin: Secondary | ICD-10-CM

## 2020-04-13 DIAGNOSIS — K7040 Alcoholic hepatic failure without coma: Principal | ICD-10-CM | POA: Diagnosis present

## 2020-04-13 DIAGNOSIS — M1990 Unspecified osteoarthritis, unspecified site: Secondary | ICD-10-CM | POA: Diagnosis present

## 2020-04-13 DIAGNOSIS — G4733 Obstructive sleep apnea (adult) (pediatric): Secondary | ICD-10-CM | POA: Diagnosis present

## 2020-04-13 DIAGNOSIS — K219 Gastro-esophageal reflux disease without esophagitis: Secondary | ICD-10-CM | POA: Diagnosis present

## 2020-04-13 DIAGNOSIS — R404 Transient alteration of awareness: Secondary | ICD-10-CM | POA: Diagnosis not present

## 2020-04-13 DIAGNOSIS — K7290 Hepatic failure, unspecified without coma: Secondary | ICD-10-CM

## 2020-04-13 DIAGNOSIS — Z79899 Other long term (current) drug therapy: Secondary | ICD-10-CM | POA: Diagnosis not present

## 2020-04-13 DIAGNOSIS — K5900 Constipation, unspecified: Secondary | ICD-10-CM | POA: Diagnosis present

## 2020-04-13 DIAGNOSIS — I2510 Atherosclerotic heart disease of native coronary artery without angina pectoris: Secondary | ICD-10-CM | POA: Diagnosis present

## 2020-04-13 DIAGNOSIS — R9431 Abnormal electrocardiogram [ECG] [EKG]: Secondary | ICD-10-CM

## 2020-04-13 DIAGNOSIS — J302 Other seasonal allergic rhinitis: Secondary | ICD-10-CM | POA: Diagnosis present

## 2020-04-13 DIAGNOSIS — R531 Weakness: Secondary | ICD-10-CM | POA: Diagnosis not present

## 2020-04-13 DIAGNOSIS — F419 Anxiety disorder, unspecified: Secondary | ICD-10-CM | POA: Diagnosis present

## 2020-04-13 DIAGNOSIS — R69 Illness, unspecified: Secondary | ICD-10-CM | POA: Diagnosis not present

## 2020-04-13 DIAGNOSIS — Z8616 Personal history of COVID-19: Secondary | ICD-10-CM

## 2020-04-13 DIAGNOSIS — F32A Depression, unspecified: Secondary | ICD-10-CM | POA: Diagnosis present

## 2020-04-13 DIAGNOSIS — Z823 Family history of stroke: Secondary | ICD-10-CM

## 2020-04-13 DIAGNOSIS — Z20822 Contact with and (suspected) exposure to covid-19: Secondary | ICD-10-CM | POA: Diagnosis not present

## 2020-04-13 DIAGNOSIS — K8020 Calculus of gallbladder without cholecystitis without obstruction: Secondary | ICD-10-CM | POA: Diagnosis present

## 2020-04-13 DIAGNOSIS — N400 Enlarged prostate without lower urinary tract symptoms: Secondary | ICD-10-CM | POA: Diagnosis present

## 2020-04-13 DIAGNOSIS — I8510 Secondary esophageal varices without bleeding: Secondary | ICD-10-CM | POA: Diagnosis not present

## 2020-04-13 DIAGNOSIS — Z87891 Personal history of nicotine dependence: Secondary | ICD-10-CM

## 2020-04-13 DIAGNOSIS — U071 COVID-19: Secondary | ICD-10-CM | POA: Diagnosis not present

## 2020-04-13 DIAGNOSIS — R188 Other ascites: Secondary | ICD-10-CM | POA: Diagnosis not present

## 2020-04-13 DIAGNOSIS — K766 Portal hypertension: Secondary | ICD-10-CM | POA: Diagnosis not present

## 2020-04-13 DIAGNOSIS — I252 Old myocardial infarction: Secondary | ICD-10-CM

## 2020-04-13 DIAGNOSIS — R5381 Other malaise: Secondary | ICD-10-CM | POA: Diagnosis present

## 2020-04-13 DIAGNOSIS — I700 Atherosclerosis of aorta: Secondary | ICD-10-CM | POA: Diagnosis present

## 2020-04-13 DIAGNOSIS — E785 Hyperlipidemia, unspecified: Secondary | ICD-10-CM | POA: Diagnosis present

## 2020-04-13 DIAGNOSIS — T59891A Toxic effect of other specified gases, fumes and vapors, accidental (unintentional), initial encounter: Secondary | ICD-10-CM

## 2020-04-13 DIAGNOSIS — Z8249 Family history of ischemic heart disease and other diseases of the circulatory system: Secondary | ICD-10-CM

## 2020-04-13 DIAGNOSIS — I214 Non-ST elevation (NSTEMI) myocardial infarction: Secondary | ICD-10-CM | POA: Diagnosis not present

## 2020-04-13 DIAGNOSIS — K7031 Alcoholic cirrhosis of liver with ascites: Secondary | ICD-10-CM

## 2020-04-13 DIAGNOSIS — N200 Calculus of kidney: Secondary | ICD-10-CM | POA: Diagnosis present

## 2020-04-13 DIAGNOSIS — K7682 Hepatic encephalopathy: Secondary | ICD-10-CM | POA: Diagnosis present

## 2020-04-13 DIAGNOSIS — Z882 Allergy status to sulfonamides status: Secondary | ICD-10-CM

## 2020-04-13 DIAGNOSIS — Z8673 Personal history of transient ischemic attack (TIA), and cerebral infarction without residual deficits: Secondary | ICD-10-CM | POA: Diagnosis not present

## 2020-04-13 DIAGNOSIS — Z886 Allergy status to analgesic agent status: Secondary | ICD-10-CM

## 2020-04-13 DIAGNOSIS — G9340 Encephalopathy, unspecified: Secondary | ICD-10-CM | POA: Insufficient documentation

## 2020-04-13 DIAGNOSIS — R109 Unspecified abdominal pain: Secondary | ICD-10-CM | POA: Diagnosis not present

## 2020-04-13 DIAGNOSIS — R1110 Vomiting, unspecified: Secondary | ICD-10-CM | POA: Diagnosis not present

## 2020-04-13 DIAGNOSIS — G928 Other toxic encephalopathy: Secondary | ICD-10-CM

## 2020-04-13 LAB — URINALYSIS, COMPLETE (UACMP) WITH MICROSCOPIC
Bacteria, UA: NONE SEEN
Bilirubin Urine: NEGATIVE
Glucose, UA: NEGATIVE mg/dL
Hgb urine dipstick: NEGATIVE
Ketones, ur: NEGATIVE mg/dL
Leukocytes,Ua: NEGATIVE
Nitrite: NEGATIVE
Protein, ur: NEGATIVE mg/dL
Specific Gravity, Urine: 1.018 (ref 1.005–1.030)
Squamous Epithelial / HPF: NONE SEEN (ref 0–5)
pH: 8 (ref 5.0–8.0)

## 2020-04-13 LAB — CBC WITH DIFFERENTIAL/PLATELET
Abs Immature Granulocytes: 0.03 10*3/uL (ref 0.00–0.07)
Basophils Absolute: 0.1 10*3/uL (ref 0.0–0.1)
Basophils Relative: 1 %
Eosinophils Absolute: 0.3 10*3/uL (ref 0.0–0.5)
Eosinophils Relative: 4 %
HCT: 30.8 % — ABNORMAL LOW (ref 39.0–52.0)
Hemoglobin: 10.4 g/dL — ABNORMAL LOW (ref 13.0–17.0)
Immature Granulocytes: 0 %
Lymphocytes Relative: 14 %
Lymphs Abs: 1.1 10*3/uL (ref 0.7–4.0)
MCH: 31.3 pg (ref 26.0–34.0)
MCHC: 33.8 g/dL (ref 30.0–36.0)
MCV: 92.8 fL (ref 80.0–100.0)
Monocytes Absolute: 1.1 10*3/uL — ABNORMAL HIGH (ref 0.1–1.0)
Monocytes Relative: 14 %
Neutro Abs: 5.4 10*3/uL (ref 1.7–7.7)
Neutrophils Relative %: 67 %
Platelets: 180 10*3/uL (ref 150–400)
RBC: 3.32 MIL/uL — ABNORMAL LOW (ref 4.22–5.81)
RDW: 14.5 % (ref 11.5–15.5)
WBC: 8.1 10*3/uL (ref 4.0–10.5)
nRBC: 0 % (ref 0.0–0.2)

## 2020-04-13 LAB — RESP PANEL BY RT-PCR (FLU A&B, COVID) ARPGX2
Influenza A by PCR: NEGATIVE
Influenza B by PCR: NEGATIVE
SARS Coronavirus 2 by RT PCR: POSITIVE — AB

## 2020-04-13 LAB — PROTIME-INR
INR: 1.4 — ABNORMAL HIGH (ref 0.8–1.2)
Prothrombin Time: 16.7 seconds — ABNORMAL HIGH (ref 11.4–15.2)

## 2020-04-13 LAB — COMPREHENSIVE METABOLIC PANEL
ALT: 32 U/L (ref 0–44)
AST: 46 U/L — ABNORMAL HIGH (ref 15–41)
Albumin: 3.1 g/dL — ABNORMAL LOW (ref 3.5–5.0)
Alkaline Phosphatase: 93 U/L (ref 38–126)
Anion gap: 7 (ref 5–15)
BUN: 26 mg/dL — ABNORMAL HIGH (ref 8–23)
CO2: 22 mmol/L (ref 22–32)
Calcium: 9.3 mg/dL (ref 8.9–10.3)
Chloride: 108 mmol/L (ref 98–111)
Creatinine, Ser: 1.08 mg/dL (ref 0.61–1.24)
GFR, Estimated: >60 mL/min (ref >60)
Glucose, Bld: 134 mg/dL — ABNORMAL HIGH (ref 70–99)
Potassium: 4 mmol/L (ref 3.5–5.1)
Sodium: 137 mmol/L (ref 135–145)
Total Bilirubin: 2 mg/dL — ABNORMAL HIGH (ref 0.3–1.2)
Total Protein: 6.7 g/dL (ref 6.5–8.1)

## 2020-04-13 LAB — TYPE AND SCREEN
ABO/RH(D): A POS
Antibody Screen: NEGATIVE

## 2020-04-13 LAB — MAGNESIUM: Magnesium: 2 mg/dL (ref 1.7–2.4)

## 2020-04-13 LAB — AMMONIA: Ammonia: 72 umol/L — ABNORMAL HIGH (ref 9–35)

## 2020-04-13 LAB — LIPASE, BLOOD: Lipase: 50 U/L (ref 11–51)

## 2020-04-13 LAB — TROPONIN I (HIGH SENSITIVITY)
Troponin I (High Sensitivity): 22 ng/L — ABNORMAL HIGH (ref <18)
Troponin I (High Sensitivity): 23 ng/L — ABNORMAL HIGH (ref <18)

## 2020-04-13 MED ORDER — PANTOPRAZOLE SODIUM 40 MG PO TBEC: 40.0000 mg | DELAYED_RELEASE_TABLET | Freq: Every day | ORAL | Status: DC

## 2020-04-13 MED ORDER — LACTATED RINGERS IV BOLUS: 1000.0000 mL | Freq: Once | INTRAVENOUS | Status: DC

## 2020-04-13 MED ORDER — LACTATED RINGERS IV BOLUS
500.0000 mL | Freq: Once | INTRAVENOUS | Status: AC
  Administered 2020-04-13: 500 mL via INTRAVENOUS

## 2020-04-13 MED ORDER — SODIUM CHLORIDE 0.9 % IV SOLN: 1.0000 g | INTRAVENOUS | Status: DC

## 2020-04-13 MED ORDER — AMLODIPINE BESYLATE 5 MG PO TABS
5.0000 mg | ORAL_TABLET | Freq: Every day | ORAL | Status: DC
  Administered 2020-04-13 – 2020-04-15 (×3): 5 mg via ORAL
  Filled 2020-04-13 (×3): qty 1

## 2020-04-13 MED ORDER — PANTOPRAZOLE SODIUM 40 MG IV SOLR
40.0000 mg | Freq: Once | INTRAVENOUS | Status: AC
  Administered 2020-04-13: 40 mg via INTRAVENOUS
  Filled 2020-04-13: qty 40

## 2020-04-13 MED ORDER — SODIUM CHLORIDE 0.9 % IV SOLN
INTRAVENOUS | Status: DC
Start: 2020-04-13 — End: 2020-04-13

## 2020-04-13 MED ORDER — IOHEXOL 300 MG/ML  SOLN
100.0000 mL | Freq: Once | INTRAMUSCULAR | Status: AC | PRN
  Administered 2020-04-13: 100 mL via INTRAVENOUS

## 2020-04-13 MED ORDER — METRONIDAZOLE IN NACL 5-0.79 MG/ML-% IV SOLN
500.0000 mg | Freq: Once | INTRAVENOUS | Status: AC
  Administered 2020-04-13: 500 mg via INTRAVENOUS
  Filled 2020-04-13: qty 100

## 2020-04-13 MED ORDER — ATORVASTATIN CALCIUM 20 MG PO TABS
40.0000 mg | ORAL_TABLET | Freq: Every day | ORAL | Status: DC
  Administered 2020-04-14 – 2020-04-15 (×2): 40 mg via ORAL
  Filled 2020-04-13 (×2): qty 2

## 2020-04-13 MED ORDER — PANTOPRAZOLE SODIUM 40 MG IV SOLR
40.0000 mg | INTRAVENOUS | Status: DC
  Administered 2020-04-13 – 2020-04-14 (×2): 40 mg via INTRAVENOUS
  Filled 2020-04-13 (×2): qty 40

## 2020-04-13 MED ORDER — SPIRONOLACTONE 25 MG PO TABS
150.0000 mg | ORAL_TABLET | Freq: Every day | ORAL | Status: DC
  Administered 2020-04-13 – 2020-04-15 (×3): 150 mg via ORAL
  Filled 2020-04-13 (×2): qty 6
  Filled 2020-04-13 (×2): qty 2

## 2020-04-13 MED ORDER — RIFAXIMIN 550 MG PO TABS
550.0000 mg | ORAL_TABLET | Freq: Two times a day (BID) | ORAL | Status: DC
  Administered 2020-04-13 – 2020-04-15 (×4): 550 mg via ORAL
  Filled 2020-04-13 (×4): qty 1

## 2020-04-13 MED ORDER — LACTULOSE 10 GM/15ML PO SOLN
20.0000 g | Freq: Three times a day (TID) | ORAL | Status: DC
  Administered 2020-04-13 – 2020-04-14 (×4): 20 g via ORAL
  Filled 2020-04-13 (×4): qty 30

## 2020-04-13 MED ORDER — ALBUTEROL SULFATE HFA 108 (90 BASE) MCG/ACT IN AERS
2.0000 | INHALATION_SPRAY | Freq: Four times a day (QID) | RESPIRATORY_TRACT | Status: DC | PRN
  Filled 2020-04-13: qty 6.7

## 2020-04-13 MED ORDER — LACTULOSE 10 GM/15ML PO SOLN
20.0000 g | Freq: Once | ORAL | Status: AC
  Administered 2020-04-13: 20 g via ORAL
  Filled 2020-04-13: qty 30

## 2020-04-13 MED ORDER — SODIUM CHLORIDE 0.9 % IV SOLN
1.0000 g | Freq: Once | INTRAVENOUS | Status: AC
  Administered 2020-04-13: 1 g via INTRAVENOUS
  Filled 2020-04-13: qty 10

## 2020-04-13 MED ORDER — SODIUM CHLORIDE 0.9 % IV SOLN
1.0000 g | Freq: Once | INTRAVENOUS | Status: AC
  Administered 2020-04-13: 1 g via INTRAVENOUS

## 2020-04-13 MED ORDER — SERTRALINE HCL 50 MG PO TABS
25.0000 mg | ORAL_TABLET | Freq: Every day | ORAL | Status: DC
  Administered 2020-04-14 – 2020-04-15 (×2): 25 mg via ORAL
  Filled 2020-04-13 (×2): qty 1

## 2020-04-13 MED ORDER — PROPRANOLOL HCL 10 MG PO TABS
10.0000 mg | ORAL_TABLET | Freq: Two times a day (BID) | ORAL | Status: DC
  Administered 2020-04-13 – 2020-04-15 (×4): 10 mg via ORAL
  Filled 2020-04-13 (×5): qty 1

## 2020-04-13 MED ORDER — FUROSEMIDE 20 MG PO TABS
20.0000 mg | ORAL_TABLET | Freq: Three times a day (TID) | ORAL | Status: DC
  Administered 2020-04-13 – 2020-04-15 (×6): 20 mg via ORAL
  Filled 2020-04-13 (×6): qty 1

## 2020-04-13 MED ORDER — BUSPIRONE HCL 15 MG PO TABS
15.0000 mg | ORAL_TABLET | Freq: Two times a day (BID) | ORAL | Status: DC
  Administered 2020-04-13 – 2020-04-15 (×4): 15 mg via ORAL
  Filled 2020-04-13 (×5): qty 1

## 2020-04-13 MED ORDER — SODIUM CHLORIDE 0.9 % IV SOLN
2.0000 g | INTRAVENOUS | Status: DC
  Administered 2020-04-14: 17:00:00 2 g via INTRAVENOUS
  Filled 2020-04-13: qty 20
  Filled 2020-04-13: qty 2

## 2020-04-13 NOTE — H&P (Signed)
History and Physical    Jeff Ward ELF:[id number] DOB: 08-15-1950 DOA: 04/13/2020  PCP: Adin Hector, MD   Patient coming from: Home  I have personally briefly reviewed patient's old medical records in [place]  Chief Complaint: Confusion Most of the history was obtained from patient's aunt was at the bedside HPI: Jeff Ward is a 70 y.o. male with medical history significant for alcoholic liver cirrhosis, GERD, dyslipidemia, depression who presents to the ER via EMS for evaluation of weakness and hematemesis.  Family also notes that he has been confused over the last 24 hours. Patient was recently discharged from the hospital on 04/06/20 for non-ST elevation MI and decompensated alcoholic liver cirrhosis with ascites.  He had a cardiac cath that showed normal coronaries.  He is status post paracentesis with drainage of > 10 L of ascitic fluid.  He was discharged home with family and home health was recommended. Since his discharge to his sister's house his aunt states that he has required 24-hour care and was fairly stable until last night when he became confused.  This morning his sister found some bloody emesis in his bed so called EMS and he was brought into the emergency room. Patient denies having any complaints but is only oriented to person and place.  I am unable to do a review of systems due to his mental status changes. Labs show sodium 137, potassium 4.0, chloride 108, bicarb 22, glucose 135, BUN 26, creatinine 1.08, calcium 9.3, magnesium 2.0, alkaline phosphatase 93, albumin 3.1, lipase 50, AST 46, ALT 32, total protein 6.7, ammonia 72, total bilirubin 2.0, troponin 23, white count 8.1, hemoglobin 10.4, hematocrit 30.8, MCV 92.8, RDW 14.5, platelet count 180, PT 16.7, INR 1.4 Patient SARS coronavirus 2 point-of-care test is positive. (Initial positive test was done 02/29/20) CT scan of abdomen and pelvis showed hepatic cirrhosis with portal venous hypertension  including paraesophageal varices and mild ascites. No focal hepatic mass identified. Prominent wall thickening in the cecum and ascending colon with questionable if any involvement of the terminal ileum. Overall appearance favors infectious etiology. Common infectious agents in localized right-sided colitis include yersinia, salmonella, ameoba, and tuberculosis. Although C. difficile colitis/pseudomembranous colitis more typically involves the entire colon, isolated right-sided involvement can be present in about 5% of cases, and if the patient has been on recent antibiotic therapy then testing for toxins should be considered. Other imaging findings of potential clinical significance: Coronary atherosclerosis. Cholelithiasis. Bosniak category 2 benign left kidney upper pole cyst with a faint internal septation. Nonobstructive left nephrolithiasis. Prominent stool throughout the colon favors constipation. Lumbar spondylosis and degenerative disc disease causing multilevel impingement. Large Schmorl's node along the superior endplate of L3, not present on 07/21/2018 but present on 04/06/2020. Aortic atherosclerosis. Chest x-ray reviewed by me shows no acute cardiopulmonary disease Twelve-lead EKG reviewed by me shows sinus rhythm with prolonged QT interval   ED Course: Patient is a 70 year old Caucasian male who was recently discharged from the hospital about a week ago and was brought into the ER by EMS for evaluation of weakness, confusion and an episode of hematemesis noted by family members.  Patient has elevated ammonia level.  He will be admitted to the hospital for further evaluation.   Review of Systems: As per HPI otherwise all other systems reviewed and negative.    Past Medical History:  Diagnosis Date  . Allergy    SEASONAL  . Anemia   . Anxiety   . 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 Asthma   . Chronic headache    now resolved  . CKD (chronic kidney disease)   . 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 Depression   . Esophageal varices ([place])   . GERD (gastroesophageal reflux disease)   . GI bleed   . Hiatal hernia   . History of kidney stones 1970's  . Hyperlipidemia 06/2002  . Hypertension   . Liver disease   . Psoriasis   . Stroke ([place])    PT. STATED MILD  . Substance abuse ([place])    H/O ETOH    Past Surgical History:  Procedure Laterality Date  . CARPAL TUNNEL RELEASE  2005    / right hand  . COLONOSCOPY     march 2016  . ESOPHAGEAL BANDING  09/22/2017   Procedure: ESOPHAGEAL BANDING;  Surgeon: Ladene Artist, MD;  Location: [facility];  Service: Endoscopy;;  . ESOPHAGEAL BANDING  10/11/2018   Procedure: ESOPHAGEAL BANDING;  Surgeon: Ladene Artist, MD;  Location: [facility]COPY;  Service: Endoscopy;;  . ESOPHAGOGASTRODUODENOSCOPY N/A 07/24/2015   Procedure: ESOPHAGOGASTRODUODENOSCOPY (EGD);  Surgeon: Lucilla Lame, MD;  Location: [facility] ENDOSCOPY;  Service: Endoscopy;  Laterality: N/A;  Banding  . ESOPHAGOGASTRODUODENOSCOPY (EGD) WITH PROPOFOL N/A 08/14/2015   Procedure: ESOPHAGOGASTRODUODENOSCOPY (EGD) WITH PROPOFOL;  Surgeon: Ladene Artist, MD;  Location: [facility] ENDOSCOPY;  Service: Endoscopy;  Laterality: N/A;  . ESOPHAGOGASTRODUODENOSCOPY (EGD) WITH PROPOFOL N/A 09/22/2017   Procedure: ESOPHAGOGASTRODUODENOSCOPY (EGD) WITH PROPOFOL;  Surgeon: Ladene Artist, MD;  Location: [facility]COPY;  Service: Endoscopy;  Laterality: N/A;  . ESOPHAGOGASTRODUODENOSCOPY (EGD) WITH PROPOFOL N/A 10/11/2018   Procedure: ESOPHAGOGASTRODUODENOSCOPY (EGD) WITH PROPOFOL;  Surgeon: Ladene Artist, MD;  Location: [facility] ENDOSCOPY;  Service: Endoscopy;  Laterality: N/A;  . LEFT HEART CATH N/A 04/02/2020   Procedure: Left Heart Cath and Coronary Angiography;  Surgeon: Isaias Cowman, MD;  Location: [facility];  Service: Cardiovascular;  Laterality: N/A;  . TONSILLECTOMY AND ADENOIDECTOMY     70 years old  . VASECTOMY       reports that  he quit smoking about 10 years ago. His smoking use included cigars. He quit after 35.00 years of use. He has never used smokeless tobacco. He reports that he does not drink alcohol and does not use drugs.  Allergies  Allergen Reactions  . Bee Venom Anaphylaxis  . Sulfa Antibiotics Other (See Comments)    Reaction:  Fainting   . Duloxetine Nausea Only    Family History  Problem Relation Age of Onset  . CAD Mother   . CVA Mother   . Heart disease Mother   . Melanoma Father   . CVA Father   . Liver disease Brother   . Breast cancer Paternal Aunt   . Alcohol abuse Maternal Grandfather       Prior to Admission medications   Medication Sig Start Date End Date Taking? Authorizing Provider  amLODipine (NORVASC) 5 MG tablet Take 5 mg by mouth daily. 03/21/20  Yes [provider]  aspirin EC 81 MG EC tablet Take 1 tablet (81 mg total) by mouth daily. Swallow whole. 04/07/20  Yes Fritzi Mandes, MD  atorvastatin (LIPITOR) 40 MG tablet Take 40 mg by mouth daily. 12/28/19  Yes [provider]  busPIRone (BUSPAR) 7.5 MG tablet Take 2 tablets by mouth in the morning and at bedtime. 12/27/19  Yes [provider]  EPINEPHrine 0.3 mg/0.3 mL IJ SOAJ injection Inject  into the muscle.   Yes [provider]  furosemide (LASIX) 20 MG tablet Take 3 tablets (60 mg total) by mouth daily. Patient taking differently: Take 20 mg by mouth in the morning, at noon, and at bedtime. 04/06/20  Yes Fritzi Mandes, MD  lactulose (CHRONULAC) 10 GM/15ML solution Take 20 g by mouth 2 (two) times daily as needed for mild constipation.   Yes [provider]  pantoprazole (PROTONIX) 40 MG tablet Take 1 tablet (40 mg total) by mouth daily. 03/05/16  Yes Ladene Artist, MD  propranolol (INDERAL) 10 MG tablet Take 10 mg by mouth 2 (two) times daily. 03/04/20  Yes [provider]  sertraline (ZOLOFT) 25 MG tablet Take 25 mg by mouth daily. 02/23/20  Yes [provider]   spironolactone (ALDACTONE) 50 MG tablet Take 3 tablets (150 mg total) by mouth daily. 04/06/20  Yes Fritzi Mandes, MD  VENTOLIN HFA 108 [id number] Base) MCG/ACT inhaler Inhale 2 puffs into the lungs every 6 (six) hours as needed for wheezing or shortness of breath.  07/28/16  Yes [provider]  hydrOXYzine (ATARAX/VISTARIL) 10 MG tablet Take 10 mg by mouth 3 (three) times daily as needed for itching. Patient not taking: No sig reported 03/27/20   [provider]  rifaximin (XIFAXAN) 550 MG TABS tablet Take 1 tablet (550 mg total) by mouth 2 (two) times daily. Patient not taking: No sig reported 04/06/20   Fritzi Mandes, MD    Physical Exam: Vitals:   04/13/20 1133 04/13/20 1200 04/13/20 1300 04/13/20 1339  BP: (!) 142/63 (!) 149/61 (!) 142/61 (!) 133/52  Pulse: 68 71 68 69  Resp: 18 20 17 18   Temp: 98.5 F (36.9 C)     TempSrc: Oral     SpO2: 100% 100% 100% 100%  Weight: 78.6 kg     Height: 5\' 5"  (1.651 m)        Vitals:   04/13/20 1133 04/13/20 1200 04/13/20 1300 04/13/20 1339  BP: (!) 142/63 (!) 149/61 (!) 142/61 (!) 133/52  Pulse: 68 71 68 69  Resp: 18 20 17 18   Temp: 98.5 F (36.9 C)     TempSrc: Oral     SpO2: 100% 100% 100% 100%  Weight: 78.6 kg     Height: 5\' 5"  (1.651 m)         Constitutional: Alert and oriented to person and place not to time. Not in any apparent distress.  Chronically ill-appearing HEENT:      Head: Normocephalic and atraumatic.         Eyes: PERLA, EOMI, Conjunctivae pallor. Sclera is non-icteric.       Mouth/Throat: Mucous membranes are moist.       Neck: Supple with no signs of meningismus. Cardiovascular: Regular rate and rhythm. No murmurs, gallops, or rubs. 2+ symmetrical distal pulses are present . No JVD. No LE edema Respiratory: Respiratory effort normal .Lungs sounds clear bilaterally. No wheezes, crackles, or rhonchi.  Gastrointestinal: Soft, non tender, and non distended with positive bowel sounds.  Genitourinary: No CVA  tenderness. Musculoskeletal: Nontender with normal range of motion in all extremities. No cyanosis, or erythema of extremities. Neurologic:  Face is symmetric. Moving all extremities. No gross focal neurologic deficits.  Generalized weakness Skin: Skin is warm, dry.  No rash or ulcers Psychiatric: Mood and affect are normal   Labs on Admission: I have personally reviewed following labs and imaging studies  CBC: Recent Labs  Lab 04/13/20 1141  WBC 8.1  NEUTROABS 5.4  HGB 10.4*  HCT 30.8*  MCV 92.8  PLT 409   Basic Metabolic Panel: Recent Labs  Lab 04/13/20 1141  NA 137  K 4.0  CL 108  CO2 22  GLUCOSE 134*  BUN 26*  CREATININE 1.08  CALCIUM 9.3  MG 2.0   GFR: Estimated Creatinine Clearance: 61.5 mL/min (by C-G formula based on SCr of 1.08 mg/dL). Liver Function Tests: Recent Labs  Lab 04/13/20 1141  AST 46*  ALT 32  ALKPHOS 93  BILITOT 2.0*  PROT 6.7  ALBUMIN 3.1*   Recent Labs  Lab 04/13/20 1141  LIPASE 50   Recent Labs  Lab 04/13/20 1333  AMMONIA 72*   Coagulation Profile: Recent Labs  Lab 04/13/20 1141  INR 1.4*   Cardiac Enzymes: No results for input(s): CKTOTAL, CKMB, CKMBINDEX, TROPONINI in the last 168 hours. BNP (last 3 results) No results for input(s): PROBNP in the last 8760 hours. HbA1C: No results for input(s): HGBA1C in the last 72 hours. CBG: No results for input(s): GLUCAP in the last 168 hours. Lipid Profile: No results for input(s): CHOL, HDL, LDLCALC, TRIG, CHOLHDL, LDLDIRECT in the last 72 hours. Thyroid Function Tests: No results for input(s): TSH, T4TOTAL, FREET4, T3FREE, THYROIDAB in the last 72 hours. Anemia Panel: No results for input(s): VITAMINB12, FOLATE, FERRITIN, TIBC, IRON, RETICCTPCT in the last 72 hours. Urine analysis:    Component Value Date/Time   COLORURINE YELLOW (A) 04/13/2020 1415   APPEARANCEUR CLEAR (A) 04/13/2020 1415   LABSPEC 1.018 04/13/2020 1415   PHURINE 8.0 04/13/2020 1415   GLUCOSEU  NEGATIVE 04/13/2020 1415   HGBUR NEGATIVE 04/13/2020 1415   BILIRUBINUR NEGATIVE 04/13/2020 1415   KETONESUR NEGATIVE 04/13/2020 1415   PROTEINUR NEGATIVE 04/13/2020 1415   NITRITE NEGATIVE 04/13/2020 1415   LEUKOCYTESUR NEGATIVE 04/13/2020 1415    Radiological Exams on Admission: DG Chest 2 View  Result Date: 04/13/2020 CLINICAL DATA:  Vomiting which began 2 hours ago.  Weakness. EXAM: CHEST - 2 VIEW COMPARISON:  04/01/2020. FINDINGS: Heart size normal. Aortic atherosclerosis. No pleural effusion or interstitial edema. No airspace densities identified. Degenerative changes are noted involving both AC joints. IMPRESSION: No acute cardiopulmonary abnormalities. Electronically Signed   By: Kerby Moors M.D.   On: 04/13/2020 12:48   CT ABDOMEN PELVIS W CONTRAST  Result Date: 04/13/2020 CLINICAL DATA:  Abdominal pain.  Cirrhosis.  Shortness of breath. EXAM: CT ABDOMEN AND PELVIS WITH CONTRAST TECHNIQUE: Multidetector CT imaging of the abdomen and pelvis was performed using the standard protocol following bolus administration of intravenous contrast. CONTRAST:  164mL OMNIPAQUE IOHEXOL 300 MG/ML  SOLN COMPARISON:  MRI abdomen 04/06/2020 FINDINGS: Lower chest: Right and circumflex coronary artery atherosclerosis with atherosclerotic calcification of the visualized thoracic aorta. Upper normal heart size. 5 by 3 mm triangular-shaped lingular nodule peripherally on image 4 of series 4, not readily apparent on the 07/21/2018 exam. Uphill paraesophageal varices. Hepatobiliary: Nodular hepatic contour with morphologic findings characteristic for cirrhosis. No focal hepatic mass identified. Small depending gallstones in the gallbladder without appreciable gallbladder wall thickening. No biliary dilatation. Patent portal vein. Pancreas: Unremarkable Spleen: Upper normal splenic size. Adrenals/Urinary Tract: Both adrenal glands appear normal. Bosniak category 2 left kidney upper pole cyst with a faint internal  septation. Prominence of renal sinus adipose tissue. There 2 nonobstructive left kidney lower pole calculi, each about 0.2 cm in length. Stomach/Bowel: Scattered sigmoid colon diverticula. Prominent stool throughout the colon favors constipation. There is notable degree of wall thickening in the cecum  and ascending colon questionable if any involvement of the terminal ileum. No associated pneumatosis. Overall appearance favors infectious etiology. Vascular/Lymphatic: Aortoiliac atherosclerotic vascular disease. Patent celiac trunk, SMA, and IMA. Uphill paraesophageal varices indicative of portal venous hypertension. Reproductive: Dystrophic calcifications in the left side of the prostate gland. Other: Mild perihepatic ascites. Small amount of pelvic ascites. Mild mesenteric edema. Musculoskeletal: Old right lower anterior rib deformities compatible with old fractures. Large suspected Schmorl's node along the superior endplate of L3, not present on 07/21/2018 but present on 04/06/2020. Lumbar spondylosis and degenerative disc disease causing multilevel impingement most striking on the right at L5-S1 and on the left at L3-4. IMPRESSION: 1. Hepatic cirrhosis with portal venous hypertension including paraesophageal varices and mild ascites. No focal hepatic mass identified. 2. Prominent wall thickening in the cecum and ascending colon with questionable if any involvement of the terminal ileum. Overall appearance favors infectious etiology. Common infectious agents in localized right-sided colitis include yersinia, salmonella, ameoba, and tuberculosis. Although C. difficile colitis/pseudomembranous colitis more typically involves the entire colon, isolated right-sided involvement can be present in about 5% of cases, and if the patient has been on recent antibiotic therapy then testing for toxins should be considered. 3. Other imaging findings of potential clinical significance: Coronary atherosclerosis. Cholelithiasis.  Bosniak category 2 benign left kidney upper pole cyst with a faint internal septation. Nonobstructive left nephrolithiasis. Prominent stool throughout the colon favors constipation. Lumbar spondylosis and degenerative disc disease causing multilevel impingement. Large Schmorl's node along the superior endplate of L3, not present on 07/21/2018 but present on 04/06/2020. 4. Aortic atherosclerosis. Aortic Atherosclerosis (ICD10-I70.0). Electronically Signed   By: Van Clines M.D.   On: 04/13/2020 13:48     Assessment/Plan Principal Problem:   Hepatic encephalopathy (HCC) Active Problems:   Depression   GERD (gastroesophageal reflux disease)   Alcoholic cirrhosis of liver with ascites (HCC)   Portal hypertension (HCC)   Physical deconditioning      Hepatic encephalopathy Secondary to known alcoholic cirrhosis of the liver We will place patient on scheduled lactulose and titrate for 2-3 soft stools Continue rifaximin    Alcoholic cirrhosis of liver with ascites Patient was recently discharged from the hospital and during his last hospitalization he had massive ascites and is status post paracentesis with drainage of greater than 10 L of ascitic fluid Imaging today shows mild ascites We will place patient empirically on Rocephin for presumed SBP Continue Lasix, propranolol and Aldactone Place patient on clear liquid diet     Probable GI bleed Patient's aunt/caregiver states that he noted some hematemesis in patient's bed He has not had any further episodes since his arrival to the ER H&H appears stable We will monitor closely We will place patient on IV Protonix Hold off on administration of octreotide unless further bleeding episodes    Depression Continue buspirone and sertraline     Physical deconditioning Secondary to chronic illness and recent hospital stay Patient's aunt and caregiver states that he has required 24-hour care since his discharge a week  ago We will request PT evaluation once patient's mental status improves    DVT prophylaxis: SCD Code Status: full code Family Communication: Greater than 50% of time was spent discussing patient's condition and plan of care with his aunt at the bedside.  All questions and concerns have been addressed.  She verbalizes understanding and agrees with the plan.  CODE STATUS was discussed and patient is a full code. Disposition Plan: Back to previous home environment Consults called:  none Status: At the time of admission, it appears that the appropriate admission status for the patient is inpatient. This is judged to be reasonable and necessary in order to provide the required intensity of service to ensure the patient's safety given the presenting symptoms, physical exam findings and initial radiographic and laboratory data in the context of the comorbid conditions.    Collier Bullock MD [facility]     04/13/2020, 3:38 PM

## 2020-04-13 NOTE — ED Triage Notes (Signed)
Patient arrived via EMS for weakness and vomiting that began 2 hours ago. Pt states he's thrown up 3 times.

## 2020-04-13 NOTE — ED Notes (Addendum)
Pt assisted to stand at bedside and use urinal. Pt repositioned in bed for comfort. Family member assisting patient with meal tray.

## 2020-04-13 NOTE — Progress Notes (Signed)
Patient assessed for possible paracentesis. Limited abdominal ultrasound examination did not show enough fluid to safely perform the procedure. Dr. Anselm Pancoast notified.   Please call IR with any questions.  Soyla Dryer, [place] [phone] 04/13/2020, 3:50 PM

## 2020-04-13 NOTE — ED Provider Notes (Signed)
[facility] Emergency Department Provider Note  ____________________________________________   Event Date/Time   First MD Initiated Contact with Patient 04/13/20 1130     (approximate)  I have reviewed the triage vital signs and the nursing notes.   HISTORY  Chief Complaint Weakness and Emesis   HPI Jeff Ward is a 69 y.o. male with a PMH ofalcoholic liver cirrhosis, GERD, HDL, depression, and recent admission 3/6-3/11 for decompensated alcoholic cirrhosis and NSTEMI February secondary to some demand who presents to EMS from home for assessment of some nausea and vomiting that began yesterday.  Patient states he had some blood-tinged emesis today but has not thrown up large clots or other large-volume blood.  He denies any headache or earache, sore throat, chest pain, cough, shortness of breath, Don[place] pain, back pain, urinary symptoms, diarrhea, blood in stool or melanotic stools rash or extremity pain.  Endorses generalized weakness.  States she stopped any alcohol in several weeks.  Denies any other acute concerns at this time.         Past Medical History:  Diagnosis Date  . Allergy    SEASONAL  . Anemia   . Anxiety   . Arthritis    In back and all joints  . Asthma   . Chronic headache    now resolved  . CKD (chronic kidney disease)   . 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 Depression   . Esophageal varices ([place])   . GERD (gastroesophageal reflux disease)   . GI bleed   . Hiatal hernia   . History of kidney stones 1970's  . Hyperlipidemia 06/2002  . Hypertension   . Liver disease   . Psoriasis   . Stroke ([place])    PT. STATED MILD  . Substance abuse ([place])    H/O ETOH    Patient Active Problem List   Diagnosis Date Noted  . NSTEMI (non-ST elevated myocardial infarction) ([place]) 04/01/2020  . Hypokalemia 02/29/2020  . Hypotension 02/29/2020  . Prolonged QT interval 02/29/2020  . Symptomatic anemia 02/29/2020   . Occult blood in stools 02/29/2020  . Thrombocytopenia ([place]) 02/29/2020  . SI (sacroiliac) joint inflammation ([place]) 03/31/2016  . Encephalopathy, hepatic ([place]) 03/05/2016  . Esophageal varices without bleeding ([place]) 03/05/2016  . Right foot pain 01/31/2016  . Hyperkalemia 01/31/2016  . Alcoholic cirrhosis of liver with ascites ([place]) 08/06/2015  . Secondary esophageal varices with bleeding ([place])   . Alcoholic cirrhosis ([place]) 75/64/3329  . Liver cirrhosis ([place]) 06/19/2015  . GERD (gastroesophageal reflux disease) 03/22/2015  . Personal history of colonic polyps 04/27/2013  . Depression 02/17/2013  . Hyperglycemia 07/23/2011  . OSA (obstructive sleep apnea) 04/10/2011  . Testosterone deficiency 03/06/2011  . Rectal bleeding 01/09/2011  . Degenerative disc disease, lumbar 05/09/2010  . OTHER NONSPECIFIC ABNORMAL SERUM ENZYME LEVELS 05/23/2009  . Essential hypertension, benign 09/01/2007  . COLONIC POLYPS 10/23/2006  . HLD (hyperlipidemia) 10/23/2006  . PUD 10/23/2006  . 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 CARPAL TUNNEL SYNDROME, BILATERAL, HX OF 10/23/2006  . BENIGN PROSTATIC HYPERTROPHY, HX OF 10/23/2006    Past Surgical History:  Procedure Laterality Date  . CARPAL TUNNEL RELEASE  2005    / right hand  . COLONOSCOPY     march 2016  . ESOPHAGEAL BANDING  09/22/2017   Procedure: ESOPHAGEAL BANDING;  Surgeon: Ladene Artist, MD;  Location: [facility] ENDOSCOPY;  Service: Endoscopy;;  . ESOPHAGEAL BANDING  10/11/2018   Procedure: ESOPHAGEAL BANDING;  Surgeon: Ladene Artist, MD;  Location: [facility];  Service: Endoscopy;;  . ESOPHAGOGASTRODUODENOSCOPY N/A 07/24/2015   Procedure: ESOPHAGOGASTRODUODENOSCOPY (EGD);  Surgeon: Lucilla Lame, MD;  Location: [facility] ENDOSCOPY;  Service: Endoscopy;  Laterality: N/A;  Banding  . ESOPHAGOGASTRODUODENOSCOPY (EGD) WITH PROPOFOL N/A 08/14/2015   Procedure: ESOPHAGOGASTRODUODENOSCOPY (EGD) WITH PROPOFOL;  Surgeon: Ladene Artist, MD;  Location:  [facility] ENDOSCOPY;  Service: Endoscopy;  Laterality: N/A;  . ESOPHAGOGASTRODUODENOSCOPY (EGD) WITH PROPOFOL N/A 09/22/2017   Procedure: ESOPHAGOGASTRODUODENOSCOPY (EGD) WITH PROPOFOL;  Surgeon: Ladene Artist, MD;  Location: [facility] ENDOSCOPY;  Service: Endoscopy;  Laterality: N/A;  . ESOPHAGOGASTRODUODENOSCOPY (EGD) WITH PROPOFOL N/A 10/11/2018   Procedure: ESOPHAGOGASTRODUODENOSCOPY (EGD) WITH PROPOFOL;  Surgeon: Ladene Artist, MD;  Location: [facility] ENDOSCOPY;  Service: Endoscopy;  Laterality: N/A;  . LEFT HEART CATH N/A 04/02/2020   Procedure: Left Heart Cath and Coronary Angiography;  Surgeon: Isaias Cowman, MD;  Location: [facility];  Service: Cardiovascular;  Laterality: N/A;  . TONSILLECTOMY AND ADENOIDECTOMY     70 years old  . VASECTOMY      Prior to Admission medications   Medication Sig Start Date End Date Taking? Authorizing Provider  amLODipine (NORVASC) 5 MG tablet Take 5 mg by mouth daily. 03/21/20   [provider]  aspirin EC 81 MG EC tablet Take 1 tablet (81 mg total) by mouth daily. Swallow whole. 04/07/20   Fritzi Mandes, MD  atorvastatin (LIPITOR) 40 MG tablet Take 40 mg by mouth daily. 12/28/19   [provider]  busPIRone (BUSPAR) 7.5 MG tablet Take 2 tablets by mouth in the morning and at bedtime. 12/27/19   [provider]  furosemide (LASIX) 20 MG tablet Take 3 tablets (60 mg total) by mouth daily. 04/06/20   Fritzi Mandes, MD  hydrOXYzine (ATARAX/VISTARIL) 10 MG tablet Take 10 mg by mouth 3 (three) times daily as needed for itching. 03/27/20   [provider]  pantoprazole (PROTONIX) 40 MG tablet Take 1 tablet (40 mg total) by mouth daily. 03/05/16   Ladene Artist, MD  propranolol (INDERAL) 10 MG tablet Take 10 mg by mouth 2 (two) times daily. 03/04/20   [provider]  rifaximin (XIFAXAN) 550 MG TABS tablet Take 1 tablet (550 mg total) by mouth 2 (two) times daily. 04/06/20   Fritzi Mandes, MD  sertraline (ZOLOFT) 25 MG tablet  Take 25 mg by mouth daily. 02/23/20   [provider]  spironolactone (ALDACTONE) 50 MG tablet Take 3 tablets (150 mg total) by mouth daily. 04/06/20   Fritzi Mandes, MD  VENTOLIN HFA 108 [phone] Base) MCG/ACT inhaler Inhale 2 puffs into the lungs every 6 (six) hours as needed for wheezing or shortness of breath.  07/28/16   [provider]    Allergies Bee venom, Sulfa antibiotics, and Duloxetine  Family History  Problem Relation Age of Onset  . CAD Mother   . CVA Mother   . Heart disease Mother   . Melanoma Father   . CVA Father   . Liver disease Brother   . Breast cancer Paternal Aunt   . Alcohol abuse Maternal Grandfather     Social History Social History   Tobacco Use  . Smoking status: Former Smoker    Years: 35.00    Types: Cigars    Quit date: 04/02/2010    Years since quitting: 10.0  . Smokeless tobacco: Never Used  . Tobacco comment: Quit 4 years ago  Vaping Use  . Vaping Use: Never used  Substance Use Topics  . Alcohol use: No    Alcohol/week: 2.0 standard drinks    Types: 2 Cans of beer per week    Comment: Used to be a heavy drinker, quit for 5 months now  . Drug use: No    Review of Systems  Review of Systems  Constitutional: Positive for malaise/fatigue. Negative for chills and fever.  HENT: Negative for sore throat.   Eyes: Negative for pain.  Respiratory: Negative for cough and stridor.   Cardiovascular: Negative for chest pain.  Gastrointestinal: Positive for nausea and vomiting.  Genitourinary: Negative for dysuria.  Musculoskeletal: Negative for myalgias.  Skin: Negative for rash.  Neurological: Negative for seizures, loss of consciousness and headaches.  Psychiatric/Behavioral: Negative for suicidal ideas.  All other systems reviewed and are negative.     ____________________________________________   PHYSICAL EXAM:  VITAL SIGNS: ED Triage Vitals [04/13/20 1127]  Enc Vitals Group     BP      Pulse      Resp      Temp       Temp src      SpO2 100 %     Weight      Height      Head Circumference      Peak Flow      Pain Score      Pain Loc      Pain Edu?      Excl. in [place]?    Vitals:   04/13/20 1300 04/13/20 1339  BP: (!) 142/61 (!) 133/52  Pulse: 68 69  Resp: 17 18  Temp:    SpO2: 100% 100%   Physical Exam Vitals and nursing note reviewed.  Constitutional:      Appearance: He is well-developed.  HENT:     Head: Normocephalic and atraumatic.     Right Ear: External ear normal.     Left Ear: External ear normal.     Nose: Nose normal.  Eyes:     Conjunctiva/sclera: Conjunctivae normal.  Cardiovascular:     Rate and Rhythm: Normal rate and regular rhythm.     Heart sounds: No murmur heard.   Pulmonary:     Effort: Pulmonary effort is normal. No respiratory distress.     Breath sounds: Normal breath sounds.  Abdominal:     Palpations: Abdomen is soft.     Tenderness: There is no abdominal tenderness.  Musculoskeletal:     Cervical back: Neck supple.  Skin:    General: Skin is warm and dry.     Capillary Refill: Capillary refill takes less than 2 seconds.     Coloration: Skin is jaundiced.  Neurological:     Mental Status: He is alert. He is disoriented and confused.     Motor: Tremor present.  Psychiatric:        Mood and Affect: Mood normal.      ____________________________________________   LABS (all labs ordered are listed, but only abnormal results are displayed)  Labs Reviewed  RESP PANEL BY RT-PCR (FLU A&B, COVID) ARPGX2 - Abnormal; Notable for the following components:      Result Value   SARS Coronavirus 2 by RT PCR POSITIVE (*)    All other components within normal limits  CBC WITH DIFFERENTIAL/PLATELET - Abnormal; Notable for the following components:   RBC 3.32 (*)    Hemoglobin 10.4 (*)    HCT 30.8 (*)    Monocytes Absolute 1.1 (*)    All other components within  normal limits  COMPREHENSIVE METABOLIC PANEL - Abnormal; Notable for the following components:    Glucose, Bld 134 (*)    BUN 26 (*)    Albumin 3.1 (*)    AST 46 (*)    Total Bilirubin 2.0 (*)    All other components within normal limits  PROTIME-INR - Abnormal; Notable for the following components:   Prothrombin Time 16.7 (*)    INR 1.4 (*)    All other components within normal limits  AMMONIA - Abnormal; Notable for the following components:   Ammonia 72 (*)    All other components within normal limits  TROPONIN I (HIGH SENSITIVITY) - Abnormal; Notable for the following components:   Troponin I (High Sensitivity) 22 (*)    All other components within normal limits  TROPONIN I (HIGH SENSITIVITY) - Abnormal; Notable for the following components:   Troponin I (High Sensitivity) 23 (*)    All other components within normal limits  CULTURE, BLOOD (ROUTINE X 2)  CULTURE, BLOOD (ROUTINE X 2)  LIPASE, BLOOD  MAGNESIUM  URINALYSIS, COMPLETE (UACMP) WITH MICROSCOPIC  TYPE AND SCREEN   ____________________________________________  EKG  Sinus rhythm with a ventricular rate of 71, normal axis, prolonged QTc interval at 557 no clear evidence of acute ischemia aside from a nonspecific change in lead III.  No other significant underlying arrhythmia.  ____________________________________________  RADIOLOGY  ED MD interpretation: Chest x-ray  with no effusion, full consolidation, edema or other clear acute intrathoracic process.  CT abdomen pelvis remarkable for hepatic cirrhosis and portal venous hypertension with some esophageal varices and mild ascites.  Patient also has some prominent thickening in the cecum and ascending colon this is with possible colitis.  No other clear acute abdominal pelvic pathology.   Official radiology report(s): DG Chest 2 View  Result Date: 04/13/2020 CLINICAL DATA:  Vomiting which began 2 hours ago.  Weakness. EXAM: CHEST - 2 VIEW COMPARISON:  04/01/2020. FINDINGS: Heart size normal. Aortic atherosclerosis. No pleural effusion or interstitial edema. No  airspace densities identified. Degenerative changes are noted involving both AC joints. IMPRESSION: No acute cardiopulmonary abnormalities. Electronically Signed   By: Kerby Moors M.D.   On: 04/13/2020 12:48   CT ABDOMEN PELVIS W CONTRAST  Result Date: 04/13/2020 CLINICAL DATA:  Abdominal pain.  Cirrhosis.  Shortness of breath. EXAM: CT ABDOMEN AND PELVIS WITH CONTRAST TECHNIQUE: Multidetector CT imaging of the abdomen and pelvis was performed using the standard protocol following bolus administration of intravenous contrast. CONTRAST:  136mL OMNIPAQUE IOHEXOL 300 MG/ML  SOLN COMPARISON:  MRI abdomen 04/06/2020 FINDINGS: Lower chest: Right and circumflex coronary artery atherosclerosis with atherosclerotic calcification of the visualized thoracic aorta. Upper normal heart size. 5 by 3 mm triangular-shaped lingular nodule peripherally on image 4 of series 4, not readily apparent on the 07/21/2018 exam. [place]ill paraesophageal varices. Hepatobiliary: Nodular hepatic contour with morphologic findings characteristic for cirrhosis. No focal hepatic mass identified. Small depending gallstones in the gallbladder without appreciable gallbladder wall thickening. No biliary dilatation. Patent portal vein. Pancreas: Unremarkable Spleen: Upper normal splenic size. Adrenals/Urinary Tract: Both adrenal glands appear normal. Bosniak category 2 left kidney upper pole cyst with a faint internal septation. Prominence of renal sinus adipose tissue. There 2 nonobstructive left kidney lower pole calculi, each about 0.2 cm in length. Stomach/Bowel: Scattered sigmoid colon diverticula. Prominent stool throughout the colon favors constipation. There is notable degree of wall thickening in the cecum and ascending colon questionable if any involvement of the terminal ileum. No associated  pneumatosis. Overall appearance favors infectious etiology. Vascular/Lymphatic: Aortoiliac atherosclerotic vascular disease. Patent celiac trunk, SMA,  and IMA. [place] paraesophageal varices indicative of portal venous hypertension. Reproductive: Dystrophic calcifications in the left side of the prostate gland. Other: Mild perihepatic ascites. Small amount of pelvic ascites. Mild mesenteric edema. Musculoskeletal: Old right lower anterior rib deformities compatible with old fractures. Large suspected Schmorl's node along the superior endplate of L3, not present on 07/21/2018 but present on 04/06/2020. Lumbar spondylosis and degenerative disc disease causing multilevel impingement most striking on the right at L5-S1 and on the left at L3-4. IMPRESSION: 1. Hepatic cirrhosis with portal venous hypertension including paraesophageal varices and mild ascites. No focal hepatic mass identified. 2. Prominent wall thickening in the cecum and ascending colon with questionable if any involvement of the terminal ileum. Overall appearance favors infectious etiology. Common infectious agents in localized right-sided colitis include yersinia, salmonella, ameoba, and tuberculosis. Although C. difficile colitis/pseudomembranous colitis more typically involves the entire colon, isolated right-sided involvement can be present in about 5% of cases, and if the patient has been on recent antibiotic therapy then testing for toxins should be considered. 3. Other imaging findings of potential clinical significance: Coronary atherosclerosis. Cholelithiasis. Bosniak category 2 benign left kidney upper pole cyst with a faint internal septation. Nonobstructive left nephrolithiasis. Prominent stool throughout the colon favors constipation. Lumbar spondylosis and degenerative disc disease causing multilevel impingement. Large Schmorl's node along the superior endplate of L3, not present on 07/21/2018 but present on 04/06/2020. 4. Aortic atherosclerosis. Aortic Atherosclerosis (ICD10-I70.0). Electronically Signed   By: Van Clines M.D.   On: 04/13/2020 13:48     ____________________________________________   PROCEDURES  Procedure(s) performed (including Critical Care):  .1-3 Lead EKG Interpretation Performed by: Lucrezia Starch, MD Authorized by: Lucrezia Starch, MD     Interpretation: normal     ECG rate assessment: normal     Rhythm: sinus rhythm     Ectopy: none     Conduction: normal       ____________________________________________   INITIAL IMPRESSION / ASSESSMENT AND PLAN / ED COURSE      Patient presents with above-stated history exam for assessment of some nausea and vomiting has some blood-tinged emesis and some generalized weakness that began yesterday.  On arrival he is slightly hypertensive with a BP of 149/61 with otherwise stable vital signs on room air.  Differential includes possible bleeding esophageal varices versus gastric or lower source i.e. peptic ulcer disease or gastritis, pneumonia, acute on chronic liver failure, UTI, metabolic derangements, atypical presentation of ACS, viral syndrome.  ECG does have an isolated nonspecific finding given troponin of 22 down from 7 9112 days ago below suspicion for ACS.  Patient also denies any chest pain.  No significant arrhythmia identified on ECG.  CMP shows no significant electrolyte or metabolic derangements.  CBC shows no leukocytosis or acute anemia.  INR is 1.4.  Lipase is 50 not consistent with acute pancreatitis.  Magnesium unremarkable.  Covid obtained in error is positive but patient is likely not infectious as he was last tested positive within 90 days.  Ammonia is elevated 72 consistent with possible hepatic encephalopathy.  Patient's wife later arrived at bedside and from the patient has caregiver with him at all times and is supervised 24 hours and has not had any falls.  Low suspicion for occult trauma.  Patient reportedly has been taking all of his medicines including his lactulose and rifaximin.  CT does show evidence of some ascites and  possible  colitis and patient's known varices and cirrhosis but no other clear acute abdominal or pelvic pathology.  No evidence of kidney stone, pyelonephritis or diverticulitis.   Per wife and patient he has not had any diarrhea given absence of tenderness on exam I have a lower suspicion for colitis.  However it is difficult to exclude possible SBP and thus I ordered blood cultures and ultrasound guided diagnostic paracentesis to obtain peritoneal fluid analysis.  In the meantime we will cover with Rocephin and Flagyl.  We will also plan to obtain UA to assess for possible contributing urinary tract infection although Rocephin will cover for this as well.  I will plan to admit to medicine service for further evaluation management.  ____________________________________________   FINAL CLINICAL IMPRESSION(S) / ED DIAGNOSES  Final diagnoses:  Ascites  Hepatic encephalopathy (HCC)  Encephalopathy due to ammonia  Alcoholic cirrhosis of liver with ascites (HCC)  QT prolongation    Medications  lactulose (CHRONULAC) 10 GM/15ML solution 20 g (has no administration in time range)  metroNIDAZOLE (FLAGYL) IVPB 500 mg (has no administration in time range)  cefTRIAXone (ROCEPHIN) 1 g in sodium chloride 0.9 % 100 mL IVPB (has no administration in time range)  pantoprazole (PROTONIX) injection 40 mg (40 mg Intravenous Given 04/13/20 1155)  lactated ringers bolus 500 mL (0 mLs Intravenous Stopped 04/13/20 1400)  iohexol (OMNIPAQUE) 300 MG/ML solution 100 mL (100 mLs Intravenous Contrast Given 04/13/20 1308)     ED Discharge Orders    None       Note:  This document was prepared using Dragon voice recognition software and may include unintentional dictation errors.   Lucrezia Starch, MD 04/13/20 [phone]

## 2020-04-14 ENCOUNTER — Encounter: Payer: Self-pay | Admitting: Internal Medicine

## 2020-04-14 DIAGNOSIS — K7031 Alcoholic cirrhosis of liver with ascites: Secondary | ICD-10-CM

## 2020-04-14 DIAGNOSIS — K7290 Hepatic failure, unspecified without coma: Secondary | ICD-10-CM

## 2020-04-14 LAB — BASIC METABOLIC PANEL
Anion gap: 7 (ref 5–15)
BUN: 23 mg/dL (ref 8–23)
CO2: 21 mmol/L — ABNORMAL LOW (ref 22–32)
Calcium: 8.9 mg/dL (ref 8.9–10.3)
Chloride: 111 mmol/L (ref 98–111)
Creatinine, Ser: 1.08 mg/dL (ref 0.61–1.24)
GFR, Estimated: >60 mL/min (ref >60)
Glucose, Bld: 101 mg/dL — ABNORMAL HIGH (ref 70–99)
Potassium: 3.6 mmol/L (ref 3.5–5.1)
Sodium: 139 mmol/L (ref 135–145)

## 2020-04-14 LAB — CBC
HCT: 28.2 % — ABNORMAL LOW (ref 39.0–52.0)
Hemoglobin: 9.6 g/dL — ABNORMAL LOW (ref 13.0–17.0)
MCH: 31.5 pg (ref 26.0–34.0)
MCHC: 34 g/dL (ref 30.0–36.0)
MCV: 92.5 fL (ref 80.0–100.0)
Platelets: 148 10*3/uL — ABNORMAL LOW (ref 150–400)
RBC: 3.05 MIL/uL — ABNORMAL LOW (ref 4.22–5.81)
RDW: 14.5 % (ref 11.5–15.5)
WBC: 6.5 10*3/uL (ref 4.0–10.5)
nRBC: 0 % (ref 0.0–0.2)

## 2020-04-14 MED ORDER — POLYETHYLENE GLYCOL 3350 17 G PO PACK
17.0000 g | PACK | Freq: Every day | ORAL | Status: DC | PRN
  Administered 2020-04-14: 22:00:00 17 g via ORAL
  Filled 2020-04-14: qty 1

## 2020-04-14 MED ORDER — LACTULOSE 10 GM/15ML PO SOLN
30.0000 g | Freq: Three times a day (TID) | ORAL | Status: DC
  Administered 2020-04-14 – 2020-04-15 (×2): 30 g via ORAL
  Filled 2020-04-14 (×2): qty 60

## 2020-04-14 MED ORDER — POTASSIUM CHLORIDE CRYS ER 20 MEQ PO TBCR
40.0000 meq | EXTENDED_RELEASE_TABLET | Freq: Once | ORAL | Status: AC
  Administered 2020-04-14: 13:00:00 40 meq via ORAL
  Filled 2020-04-14: qty 2

## 2020-04-14 NOTE — Progress Notes (Addendum)
Progress Note    Jeff Ward  NGE:[id number] DOB: 10/11/1950  DOA: 04/13/2020 PCP: Adin Hector, MD      Brief Narrative:    Medical records reviewed and are as summarized below:  Jeff Ward is a 70 y.o. male  with medical history significant foralcoholic liver cirrhosis, GERD, dyslipidemia, depression, recent discharge from the hospital on 04/06/2020 after hospitalization for NSTEMI (normal coronaries on cath) and decompensated alcoholic liver cirrhosis with ascites.  He presented to the hospital because of generalized weakness, confusion and hematemesis.      Assessment/Plan:   Principal Problem:   Hepatic encephalopathy (HCC) Active Problems:   Depression   GERD (gastroesophageal reflux disease)   Alcoholic cirrhosis of liver with ascites (HCC)   Portal hypertension (HCC)   Physical deconditioning   Body mass index is 28.84 kg/m.    Alcoholic liver cirrhosis with ascites complicated by hepatic encephalopathy: Mental status is improving.  Continue diuretics, rifaximin and lactulose.  ? GI bleeding with history of esophageal varices: Continue IV Rocephin and IV Protonix for now.  Continue propranolol.  Monitor H&H.  Debility generalized weakness: PT evaluation  Recent COVID-19 infection with positive test 02/29/2020: No need for isolation or treatment   Diet Order            Diet clear liquid Room service appropriate? Yes; Fluid consistency: Thin  Diet effective now                    Consultants:  None  Procedures:  None    Medications:   . amLODipine  5 mg Oral Daily  . atorvastatin  40 mg Oral Daily  . busPIRone  15 mg Oral BID  . furosemide  20 mg Oral TID  . lactulose  20 g Oral TID  . pantoprazole (PROTONIX) IV  40 mg Intravenous Q24H  . potassium chloride  40 mEq Oral Once  . propranolol  10 mg Oral BID  . rifaximin  550 mg Oral BID  . sertraline  25 mg Oral Daily  . spironolactone  150 mg Oral Daily   Continuous  Infusions: . cefTRIAXone (ROCEPHIN)  IV       Anti-infectives (From admission, onward)   Start     Dose/Rate Route Frequency Ordered Stop   04/14/20 1500  cefTRIAXone (ROCEPHIN) 2 g in sodium chloride 0.9 % 100 mL IVPB        2 g 200 mL/hr over 30 Minutes Intravenous Every 24 hours 04/13/20 1619     04/13/20 1800  rifaximin (XIFAXAN) tablet 550 mg        550 mg Oral 2 times daily 04/13/20 1554     04/13/20 1630  cefTRIAXone (ROCEPHIN) 1 g in sodium chloride 0.9 % 100 mL IVPB        1 g 200 mL/hr over 30 Minutes Intravenous  Once 04/13/20 1619 04/13/20 1738   04/13/20 1545  cefTRIAXone (ROCEPHIN) 1 g in sodium chloride 0.9 % 100 mL IVPB  Status:  Discontinued        1 g 200 mL/hr over 30 Minutes Intravenous Every 24 hours 04/13/20 1554 04/13/20 1614   04/13/20 1430  metroNIDAZOLE (FLAGYL) IVPB 500 mg        500 mg 100 mL/hr over 60 Minutes Intravenous  Once 04/13/20 1420 04/13/20 1643   04/13/20 1430  cefTRIAXone (ROCEPHIN) 1 g in sodium chloride 0.9 % 100 mL IVPB        1  g 200 mL/hr over 30 Minutes Intravenous  Once 04/13/20 1420 04/13/20 1536             Family Communication/Anticipated D/C date and plan/Code Status   DVT prophylaxis: SCDs Start: 04/13/20 1543     Code Status: Full Code  Family Communication: None Disposition Plan:    Status is: Inpatient  Remains inpatient appropriate because:Inpatient level of care appropriate due to severity of illness   Dispo: The patient is from: Home              Anticipated d/c is to: Home              Patient currently is not medically stable to d/c.   Difficult to place patient No           Subjective:   Interval events noted.  No vomiting, diarrhea or abdominal pain.  Confusion is a little better.  Objective:    Vitals:   04/13/20 2055 04/14/20 0003 04/14/20 0446 04/14/20 0739  BP: 119/61 (!) 121/52 (!) 117/53 115/69  Pulse: 68 70 69 62  Resp: 18 16 16 15   Temp: 98.6 F (37 C) 97.8 F (36.6 C)  97.8 F (36.6 C) 98.5 F (36.9 C)  TempSrc:    Oral  SpO2: 100% 97% 98% 100%  Weight:      Height:       No data found.   Intake/Output Summary (Last 24 hours) at 04/14/2020 1051 Last data filed at 04/14/2020 1035 Gross per 24 hour  Intake 180 ml  Output --  Net 180 ml   Filed Weights   04/13/20 1133  Weight: 78.6 kg    Exam:  GEN: NAD SKIN: No rash EYES: EOMI ENT: MMM CV: RRR PULM: CTA B ABD: soft, ND, NT, +BS CNS: AAO x 3, non focal EXT: No edema or tenderness        Data Reviewed:   I have personally reviewed following labs and imaging studies:  Labs: Labs show the following:   Basic Metabolic Panel: Recent Labs  Lab 04/13/20 1141 04/14/20 0513  NA 137 139  K 4.0 3.6  CL 108 111  CO2 22 21*  GLUCOSE 134* 101*  BUN 26* 23  CREATININE 1.08 1.08  CALCIUM 9.3 8.9  MG 2.0  --    GFR Estimated Creatinine Clearance: 61.5 mL/min (by C-G formula based on SCr of 1.08 mg/dL). Liver Function Tests: Recent Labs  Lab 04/13/20 1141  AST 46*  ALT 32  ALKPHOS 93  BILITOT 2.0*  PROT 6.7  ALBUMIN 3.1*   Recent Labs  Lab 04/13/20 1141  LIPASE 50   Recent Labs  Lab 04/13/20 1333  AMMONIA 72*   Coagulation profile Recent Labs  Lab 04/13/20 1141  INR 1.4*    CBC: Recent Labs  Lab 04/13/20 1141 04/14/20 0513  WBC 8.1 6.5  NEUTROABS 5.4  --   HGB 10.4* 9.6*  HCT 30.8* 28.2*  MCV 92.8 92.5  PLT 180 148*   Cardiac Enzymes: No results for input(s): CKTOTAL, CKMB, CKMBINDEX, TROPONINI in the last 168 hours. BNP (last 3 results) No results for input(s): PROBNP in the last 8760 hours. CBG: No results for input(s): GLUCAP in the last 168 hours. D-Dimer: No results for input(s): DDIMER in the last 72 hours. Hgb A1c: No results for input(s): HGBA1C in the last 72 hours. Lipid Profile: No results for input(s): CHOL, HDL, LDLCALC, TRIG, CHOLHDL, LDLDIRECT in the last 72 hours. Thyroid function studies: No results  for input(s): TSH,  T4TOTAL, T3FREE, THYROIDAB in the last 72 hours.  Invalid input(s): FREET3 Anemia work up: No results for input(s): VITAMINB12, FOLATE, FERRITIN, TIBC, IRON, RETICCTPCT in the last 72 hours. Sepsis Labs: Recent Labs  Lab 04/13/20 1141 04/14/20 0513  WBC 8.1 6.5    Microbiology Recent Results (from the past 240 hour(s))  Resp Panel by RT-PCR (Flu A&B, Covid) Nasopharyngeal Swab     Status: Abnormal   Collection Time: 04/13/20 11:42 AM   Specimen: Nasopharyngeal Swab; Nasopharyngeal(NP) swabs in vial transport medium  Result Value Ref Range Status   SARS Coronavirus 2 by RT PCR POSITIVE (A) NEGATIVE Final    Comment: RESULT CALLED TO, READ BACK BY AND VERIFIED WITH: JASHIRA CARRION AT 1307 04/13/20.PMF (NOTE) SARS-CoV-2 target nucleic acids are DETECTED.  The SARS-CoV-2 RNA is generally detectable in upper respiratory specimens during the acute phase of infection. Positive results are indicative of the presence of the identified virus, but do not rule out bacterial infection or co-infection with other pathogens not detected by the test. Clinical correlation with patient history and other diagnostic information is necessary to determine patient infection status. The expected result is Negative.  Fact Sheet for Patients: EntrepreneurPulse.com.au  Fact Sheet for Healthcare Providers: IncredibleEmployment.be  This test is not yet approved or cleared by the [place] FDA and  has been authorized for detection and/or diagnosis of SARS-CoV-2 by FDA under an Emergency Use Authorization (EUA).  This EUA will remain in effect (meaning this test can  be used) for the duration of  the COVID-19 declaration under Section 564(b)(1) of the Act, 21 U.S.C. section 360bbb-3(b)(1), unless the authorization is terminated or revoked sooner.     Influenza A by PCR NEGATIVE NEGATIVE Final   Influenza B by PCR NEGATIVE NEGATIVE Final    Comment:  (NOTE) The Xpert Xpress SARS-CoV-2/FLU/RSV plus assay is intended as an aid in the diagnosis of influenza from Nasopharyngeal swab specimens and should not be used as a sole basis for treatment. Nasal washings and aspirates are unacceptable for Xpert Xpress SARS-CoV-2/FLU/RSV testing.  Fact Sheet for Patients: EntrepreneurPulse.com.au  Fact Sheet for Healthcare Providers: IncredibleEmployment.be  This test is not yet approved or cleared by the [place] FDA and has been authorized for detection and/or diagnosis of SARS-CoV-2 by FDA under an Emergency Use Authorization (EUA). This EUA will remain in effect (meaning this test can be used) for the duration of the COVID-19 declaration under Section 564(b)(1) of the Act, 21 U.S.C. section 360bbb-3(b)(1), unless the authorization is terminated or revoked.  Performed at [facility], [address] [place], [place] [address]   Blood culture (routine x 2)     Status: None (Preliminary result)   Collection Time: 04/13/20  3:10 PM   Specimen: BLOOD  Result Value Ref Range Status   Specimen Description BLOOD LEFT ANTECUBITAL  Final   Special Requests   Final    BOTTLES DRAWN AEROBIC AND ANAEROBIC Blood Culture results may not be optimal due to an excessive volume of blood received in culture bottles   Culture   Final    NO GROWTH < 24 HOURS Performed at [facility], [address] [place], [place] [address]    Report Status PENDING  Incomplete  Blood culture (routine x 2)     Status: None (Preliminary result)   Collection Time: 04/13/20  3:10 PM   Specimen: BLOOD  Result Value Ref Range Status   Specimen Description BLOOD BLOOD LEFT FOREARM  Final   Special Requests   Final    BOTTLES DRAWN AEROBIC AND ANAEROBIC Blood Culture adequate volume   Culture   Final    NO GROWTH < 24 HOURS Performed at [facility], [address] [place], [place] [address]     Report Status PENDING  Incomplete    Procedures and diagnostic studies:  DG Chest 2 View  Result Date: 04/13/2020 CLINICAL DATA:  Vomiting which began 2 hours ago.  Weakness. EXAM: CHEST - 2 VIEW COMPARISON:  04/01/2020. FINDINGS: Heart size normal. Aortic atherosclerosis. No pleural effusion or interstitial edema. No airspace densities identified. Degenerative changes are noted involving both AC joints. IMPRESSION: No acute cardiopulmonary abnormalities. Electronically Signed   By: Kerby Moors M.D.   On: 04/13/2020 12:48   CT ABDOMEN PELVIS W CONTRAST  Result Date: 04/13/2020 CLINICAL DATA:  Abdominal pain.  Cirrhosis.  Shortness of breath. EXAM: CT ABDOMEN AND PELVIS WITH CONTRAST TECHNIQUE: Multidetector CT imaging of the abdomen and pelvis was performed using the standard protocol following bolus administration of intravenous contrast. CONTRAST:  114mL OMNIPAQUE IOHEXOL 300 MG/ML  SOLN COMPARISON:  MRI abdomen 04/06/2020 FINDINGS: Lower chest: Right and circumflex coronary artery atherosclerosis with atherosclerotic calcification of the visualized thoracic aorta. Upper normal heart size. 5 by 3 mm triangular-shaped lingular nodule peripherally on image 4 of series 4, not readily apparent on the 07/21/2018 exam. Uphill paraesophageal varices. Hepatobiliary: Nodular hepatic contour with morphologic findings characteristic for cirrhosis. No focal hepatic mass identified. Small depending gallstones in the gallbladder without appreciable gallbladder wall thickening. No biliary dilatation. Patent portal vein. Pancreas: Unremarkable Spleen: Upper normal splenic size. Adrenals/Urinary Tract: Both adrenal glands appear normal. Bosniak category 2 left kidney upper pole cyst with a faint internal septation. Prominence of renal sinus adipose tissue. There 2 nonobstructive left kidney lower pole calculi, each about 0.2 cm in length. Stomach/Bowel: Scattered sigmoid colon diverticula. Prominent stool throughout  the colon favors constipation. There is notable degree of wall thickening in the cecum and ascending colon questionable if any involvement of the terminal ileum. No associated pneumatosis. Overall appearance favors infectious etiology. Vascular/Lymphatic: Aortoiliac atherosclerotic vascular disease. Patent celiac trunk, SMA, and IMA. Uphill paraesophageal varices indicative of portal venous hypertension. Reproductive: Dystrophic calcifications in the left side of the prostate gland. Other: Mild perihepatic ascites. Small amount of pelvic ascites. Mild mesenteric edema. Musculoskeletal: Old right lower anterior rib deformities compatible with old fractures. Large suspected Schmorl's node along the superior endplate of L3, not present on 07/21/2018 but present on 04/06/2020. Lumbar spondylosis and degenerative disc disease causing multilevel impingement most striking on the right at L5-S1 and on the left at L3-4. IMPRESSION: 1. Hepatic cirrhosis with portal venous hypertension including paraesophageal varices and mild ascites. No focal hepatic mass identified. 2. Prominent wall thickening in the cecum and ascending colon with questionable if any involvement of the terminal ileum. Overall appearance favors infectious etiology. Common infectious agents in localized right-sided colitis include yersinia, salmonella, ameoba, and tuberculosis. Although C. difficile colitis/pseudomembranous colitis more typically involves the entire colon, isolated right-sided involvement can be present in about 5% of cases, and if the patient has been on recent antibiotic therapy then testing for toxins should be considered. 3. Other imaging findings of potential clinical significance: Coronary atherosclerosis. Cholelithiasis. Bosniak category 2 benign left kidney upper pole cyst with a faint internal septation. Nonobstructive left nephrolithiasis. Prominent stool throughout the colon favors constipation. Lumbar spondylosis and degenerative  disc disease causing multilevel impingement. Large Schmorl's node along  the superior endplate of L3, not present on 07/21/2018 but present on 04/06/2020. 4. Aortic atherosclerosis. Aortic Atherosclerosis (ICD10-I70.0). Electronically Signed   By: Van Clines M.D.   On: 04/13/2020 13:48   [place] ASCITES (ABDOMEN LIMITED)  Result Date: 04/13/2020 CLINICAL DATA:  Evaluate for ascites. EXAM: LIMITED ABDOMEN ULTRASOUND FOR ASCITES TECHNIQUE: Limited ultrasound survey for ascites was performed in all four abdominal quadrants. COMPARISON:  04/06/2020 and CT 04/13/2020 FINDINGS: Minimal ascites in the right upper quadrant and right mid abdomen. No significant ascites in the lower quadrants. Minimal ascites around the spleen. IMPRESSION: Minimal ascites.  Paracentesis not performed. Electronically Signed   By: Markus Daft M.D.   On: 04/13/2020 17:25               LOS: 1 day   Lesley Atkin  [facility]   Pager on [email or web address]. If 7PM-7AM, please contact night-coverage at [email or web address]     04/14/2020, 10:51 AM

## 2020-04-15 LAB — CBC WITH DIFFERENTIAL/PLATELET
Abs Immature Granulocytes: 0.03 10*3/uL (ref 0.00–0.07)
Basophils Absolute: 0.1 10*3/uL (ref 0.0–0.1)
Basophils Relative: 1 %
Eosinophils Absolute: 0.6 10*3/uL — ABNORMAL HIGH (ref 0.0–0.5)
Eosinophils Relative: 8 %
HCT: 29.1 % — ABNORMAL LOW (ref 39.0–52.0)
Hemoglobin: 9.6 g/dL — ABNORMAL LOW (ref 13.0–17.0)
Immature Granulocytes: 0 %
Lymphocytes Relative: 19 %
Lymphs Abs: 1.5 10*3/uL (ref 0.7–4.0)
MCH: 30.9 pg (ref 26.0–34.0)
MCHC: 33 g/dL (ref 30.0–36.0)
MCV: 93.6 fL (ref 80.0–100.0)
Monocytes Absolute: 1.3 10*3/uL — ABNORMAL HIGH (ref 0.1–1.0)
Monocytes Relative: 17 %
Neutro Abs: 4.2 10*3/uL (ref 1.7–7.7)
Neutrophils Relative %: 55 %
Platelets: 161 10*3/uL (ref 150–400)
RBC: 3.11 MIL/uL — ABNORMAL LOW (ref 4.22–5.81)
RDW: 14.4 % (ref 11.5–15.5)
WBC: 7.8 10*3/uL (ref 4.0–10.5)
nRBC: 0 % (ref 0.0–0.2)

## 2020-04-15 LAB — BASIC METABOLIC PANEL
Anion gap: 7 (ref 5–15)
BUN: 19 mg/dL (ref 8–23)
CO2: 20 mmol/L — ABNORMAL LOW (ref 22–32)
Calcium: 8.7 mg/dL — ABNORMAL LOW (ref 8.9–10.3)
Chloride: 109 mmol/L (ref 98–111)
Creatinine, Ser: 1.04 mg/dL (ref 0.61–1.24)
GFR, Estimated: >60 mL/min (ref >60)
Glucose, Bld: 129 mg/dL — ABNORMAL HIGH (ref 70–99)
Potassium: 4.1 mmol/L (ref 3.5–5.1)
Sodium: 136 mmol/L (ref 135–145)

## 2020-04-15 LAB — AMMONIA: Ammonia: 59 umol/L — ABNORMAL HIGH (ref 9–35)

## 2020-04-15 NOTE — Discharge Instructions (Signed)
Hepatic Encephalopathy  Hepatic encephalopathy is a change in brain function that includes changes in the ability to think and to use muscles. This condition happens when a person has advanced liver disease. When the liver is damaged, harmful substances (toxins) can build up in the body. Some of these toxins, such as ammonia, can harm the brain. The effects of the condition depend on the type of liver damage and how severe it is. In some cases, hepatic encephalopathy can be reversed. What are the causes? Certain things can trigger or worsen liver function, which can result in hepatic encephalopathy. These things include:  Infection.  Constipation.  Taking certain medicines, such as benzodiazepines.  Alcohol use.  Bleeding into the intestinal tract.  Imbalances in minerals (electrolytes) in the body.  Dehydration. Hepatic encephalopathy can sometimes be reversed if these triggers are resolved. What increases the risk? You are at risk of developing this condition if you have advanced liver disease (cirrhosis). Conditions that can cause liver disease include:  Infections in the liver, such as hepatitis C.  Infections in the blood.  Drinking a lot of alcohol over a long period of time.  Taking certain medicines, including tranquilizers, diuretics, antidepressants, sleeping pills, or acetaminophen.  Genetic diseases, such as Wilson's disease. What are the signs or symptoms? Symptoms may develop suddenly or may develop slowly and get worse gradually. Symptoms can range from mild to severe. Mild symptoms include:  Mild confusion.  Shortened attention span.  Personality and mood changes.  Anxiety and agitation.  Drowsiness. Symptoms of worsening or severe hepatic encephalopathy include:  Extreme confusion (disorientation).  Slowed movement.  Slurred speech.  Extreme personality changes.  Abnormal shaking or flapping of the hands (asterixis).  Coma. How is this  diagnosed? This condition may be diagnosed based on:  A physical exam.  Your symptoms and medical history.  Blood tests. These may be done to check levels of ammonia in your blood, measure how long it takes your blood to clot, or check for infection.  Liver function tests. These may be done to check how well your liver is working.  MRI and CT scans. These may be done to check for a brain disorder and to check for problems with your liver.  Electroencephalogram (EEG). This test measures the electrical activity in your brain. How is this treated? The first step in treatment is to identify and treat the cause of your liver damage or triggering illness, if possible. The next step is taking medicine to lower the level of toxins in your body and prevent ammonia from building up. Treatment will depend on how severe your encephalopathy is, and may include:  Medicine to lower your ammonia level (lactulose).  Antibiotic medicine to reduce the amount of ammonia-producing bacteria in your gut.  Close monitoring of your blood pressure, heart rate, breathing, and oxygen levels.  Removal of fluid from your abdomen.  Close monitoring of how you think, feel, and act (mental status).  Changes to your diet.  Liver transplant, in severe cases. Follow these instructions at home: Medicines  Take over-the-counter and prescription medicines only as told by your health care provider.  If you were prescribed an antibiotic medicine, take it as told by your health care provider. Do not stop using the antibiotic even if you start to feel better.  Do not start taking any new medicines, including over-the-counter medicines, without first checking with your health care provider. Eating and drinking  Work with a dietitian or your health care provider   to make sure you are getting the right balance of protein and minerals.  Eat small meals throughout the day with a late-night snack of complex carbohydrates.  Do not fast.  Drink enough fluids to keep your urine pale yellow.  Do not drink alcohol.   General instructions  Do not use drugs.  Ask your health care provider if it is safe for you to drive.  Keep all follow-up visits. This is important. Contact a health care provider if:  You develop new symptoms.  Your symptoms change or get worse.  You have a fever or chills.  You have persistent nausea, vomiting, or diarrhea. Get help right away if:  You become very confused or drowsy.  You vomit blood or material that looks like coffee grounds.  Your stool is bloody, black, or looks like tar. Summary  Hepatic encephalopathy is a change in brain function that includes changes in the ability to think and to use muscles. This condition happens when a person has advanced liver disease.  Certain things can trigger or worsen hepatic encephalopathy. Hepatic encephalopathy can sometimes be reversed if these triggers are resolved.  The first step in treatment is to identify and treat the cause of your liver damage or triggering illness, if possible. The next step is taking medicine to lower the level of toxins in your body and prevent ammonia from building up.  Your treatment will depend on how severe your hepatic encephalopathy is. This information is not intended to replace advice given to you by your health care provider. Make sure you discuss any questions you have with your health care provider. Document Revised: 10/11/2019 Document Reviewed: 10/11/2019 Elsevier Patient Education  2021 [employer]  

## 2020-04-15 NOTE — Discharge Summary (Signed)
Physician Discharge Summary  Jeff Ward VZD:[id number] DOB: [birth year] DOA: 04/13/2020  PCP: Adin Hector, MD  Admit date: 04/13/2020 Discharge date: 04/15/2020  Discharge disposition: Home   Recommendations for Outpatient Follow-Up:   Follow up with PCP in 1 week   Discharge Diagnosis:   Principal Problem:   Hepatic encephalopathy ([place]) Active Problems:   Depression   GERD (gastroesophageal reflux disease)   Alcoholic cirrhosis of liver with ascites ([place])   Portal hypertension ([facility])   Physical deconditioning    Discharge Condition: Stable.  Diet recommendation:  Diet Order            Diet - low sodium heart healthy           Diet clear liquid Room service appropriate? Yes; Fluid consistency: Thin  Diet effective now                   Code Status: Full Code     Hospital Course:   Jeff Ward is a 70 y.o. male with medical history significant foralcoholic liver cirrhosis, GERD, dyslipidemia, depression, recent discharge from the hospital on 04/06/2020 after hospitalization for NSTEMI (normal coronaries on cath) and decompensated alcoholic liver cirrhosis with ascites. He presented to the hospital because of generalized weakness, confusion and vomiting.  Reportedly, the vomitus was blood-tinged.  He was admitted to the hospital for acute hepatic encephalopathy and GI bleeding.  He was treated with IV fluids, IV Protonix, empiric IV antibiotics, lactulose and rifaximin.  H&H remained stable.  He moved his bowels multiple times but stools were nonbloody.  His mental status has improved and is back to baseline.  He is deemed stable for discharge to home today.  Discharge plan was discussed with his sister, Ms. Loyola Mast (per patient's request).    Discharge Exam:    Vitals:   04/14/20 2020 04/15/20 0200 04/15/20 0422 04/15/20 0933  BP: 127/60 137/67 127/65 122/64  Pulse: 80 87 72 66  Resp: 20 18 17 17   Temp: 98.6 F (37 C) 98 F (36.7 C)  97.8 F (36.6 C) 98.4 F (36.9 C)  TempSrc: Oral Oral Oral Oral  SpO2: 100% 100% 99% 100%  Weight:      Height:         GEN: NAD SKIN: No rash EYES: EOMI ENT: MMM CV: RRR PULM: CTA B ABD: soft,  obese, NT, +BS CNS: AAO x 3, non focal EXT: No edema or tenderness   The results of significant diagnostics from this hospitalization (including imaging, microbiology, ancillary and laboratory) are listed below for reference.     Procedures and Diagnostic Studies:   DG Chest 2 View  Result Date: 04/13/2020 CLINICAL DATA:  Vomiting which began 2 hours ago.  Weakness. EXAM: CHEST - 2 VIEW COMPARISON:  04/01/2020. FINDINGS: Heart size normal. Aortic atherosclerosis. No pleural effusion or interstitial edema. No airspace densities identified. Degenerative changes are noted involving both AC joints. IMPRESSION: No acute cardiopulmonary abnormalities. Electronically Signed   By: Kerby Moors M.D.   On: 04/13/2020 12:48   CT ABDOMEN PELVIS W CONTRAST  Result Date: 04/13/2020 CLINICAL DATA:  Abdominal pain.  Cirrhosis.  Shortness of breath. EXAM: CT ABDOMEN AND PELVIS WITH CONTRAST TECHNIQUE: Multidetector CT imaging of the abdomen and pelvis was performed using the standard protocol following bolus administration of intravenous contrast. CONTRAST:  165mL OMNIPAQUE IOHEXOL 300 MG/ML  SOLN COMPARISON:  MRI abdomen 04/06/2020 FINDINGS: Lower chest: Right and circumflex coronary artery atherosclerosis with atherosclerotic  calcification of the visualized thoracic aorta. Upper normal heart size. 5 by 3 mm triangular-shaped lingular nodule peripherally on image 4 of series 4, not readily apparent on the 07/21/2018 exam. Uphill paraesophageal varices. Hepatobiliary: Nodular hepatic contour with morphologic findings characteristic for cirrhosis. No focal hepatic mass identified. Small depending gallstones in the gallbladder without appreciable gallbladder wall thickening. No biliary dilatation. Patent  portal vein. Pancreas: Unremarkable Spleen: Upper normal splenic size. Adrenals/Urinary Tract: Both adrenal glands appear normal. Bosniak category 2 left kidney upper pole cyst with a faint internal septation. Prominence of renal sinus adipose tissue. There 2 nonobstructive left kidney lower pole calculi, each about 0.2 cm in length. Stomach/Bowel: Scattered sigmoid colon diverticula. Prominent stool throughout the colon favors constipation. There is notable degree of wall thickening in the cecum and ascending colon questionable if any involvement of the terminal ileum. No associated pneumatosis. Overall appearance favors infectious etiology. Vascular/Lymphatic: Aortoiliac atherosclerotic vascular disease. Patent celiac trunk, SMA, and IMA. Uphill paraesophageal varices indicative of portal venous hypertension. Reproductive: Dystrophic calcifications in the left side of the prostate gland. Other: Mild perihepatic ascites. Small amount of pelvic ascites. Mild mesenteric edema. Musculoskeletal: Old right lower anterior rib deformities compatible with old fractures. Large suspected Schmorl's node along the superior endplate of L3, not present on 07/21/2018 but present on 04/06/2020. Lumbar spondylosis and degenerative disc disease causing multilevel impingement most striking on the right at L5-S1 and on the left at L3-4. IMPRESSION: 1. Hepatic cirrhosis with portal venous hypertension including paraesophageal varices and mild ascites. No focal hepatic mass identified. 2. Prominent wall thickening in the cecum and ascending colon with questionable if any involvement of the terminal ileum. Overall appearance favors infectious etiology. Common infectious agents in localized right-sided colitis include yersinia, salmonella, ameoba, and tuberculosis. Although C. difficile colitis/pseudomembranous colitis more typically involves the entire colon, isolated right-sided involvement can be present in about 5% of cases, and if  the patient has been on recent antibiotic therapy then testing for toxins should be considered. 3. Other imaging findings of potential clinical significance: Coronary atherosclerosis. Cholelithiasis. Bosniak category 2 benign left kidney upper pole cyst with a faint internal septation. Nonobstructive left nephrolithiasis. Prominent stool throughout the colon favors constipation. Lumbar spondylosis and degenerative disc disease causing multilevel impingement. Large Schmorl's node along the superior endplate of L3, not present on 07/21/2018 but present on 04/06/2020. 4. Aortic atherosclerosis. Aortic Atherosclerosis (ICD10-I70.0). Electronically Signed   By: Van Clines M.D.   On: 04/13/2020 13:48   [place] ASCITES (ABDOMEN LIMITED)  Result Date: 04/13/2020 CLINICAL DATA:  Evaluate for ascites. EXAM: LIMITED ABDOMEN ULTRASOUND FOR ASCITES TECHNIQUE: Limited ultrasound survey for ascites was performed in all four abdominal quadrants. COMPARISON:  04/06/2020 and CT 04/13/2020 FINDINGS: Minimal ascites in the right upper quadrant and right mid abdomen. No significant ascites in the lower quadrants. Minimal ascites around the spleen. IMPRESSION: Minimal ascites.  Paracentesis not performed. Electronically Signed   By: Markus Daft M.D.   On: 04/13/2020 17:25     Labs:   Basic Metabolic Panel: Recent Labs  Lab 04/13/20 1141 04/14/20 0513 04/15/20 0513  NA 137 139 136  K 4.0 3.6 4.1  CL 108 111 109  CO2 22 21* 20*  GLUCOSE 134* 101* 129*  BUN 26* 23 19  CREATININE 1.08 1.08 1.04  CALCIUM 9.3 8.9 8.7*  MG 2.0  --   --    GFR Estimated Creatinine Clearance: 63.8 mL/min (by C-G formula based on SCr of 1.04 mg/dL). Liver Function Tests:  Recent Labs  Lab 04/13/20 1141  AST 46*  ALT 32  ALKPHOS 93  BILITOT 2.0*  PROT 6.7  ALBUMIN 3.1*   Recent Labs  Lab 04/13/20 1141  LIPASE 50   Recent Labs  Lab 04/13/20 1333 04/15/20 0513  AMMONIA 72* 59*   Coagulation profile Recent Labs  Lab  04/13/20 1141  INR 1.4*    CBC: Recent Labs  Lab 04/13/20 1141 04/14/20 0513 04/15/20 0513  WBC 8.1 6.5 7.8  NEUTROABS 5.4  --  4.2  HGB 10.4* 9.6* 9.6*  HCT 30.8* 28.2* 29.1*  MCV 92.8 92.5 93.6  PLT 180 148* 161   Cardiac Enzymes: No results for input(s): CKTOTAL, CKMB, CKMBINDEX, TROPONINI in the last 168 hours. BNP: Invalid input(s): POCBNP CBG: No results for input(s): GLUCAP in the last 168 hours. D-Dimer No results for input(s): DDIMER in the last 72 hours. Hgb A1c No results for input(s): HGBA1C in the last 72 hours. Lipid Profile No results for input(s): CHOL, HDL, LDLCALC, TRIG, CHOLHDL, LDLDIRECT in the last 72 hours. Thyroid function studies No results for input(s): TSH, T4TOTAL, T3FREE, THYROIDAB in the last 72 hours.  Invalid input(s): FREET3 Anemia work up No results for input(s): VITAMINB12, FOLATE, FERRITIN, TIBC, IRON, RETICCTPCT in the last 72 hours. Microbiology Recent Results (from the past 240 hour(s))  Resp Panel by RT-PCR (Flu A&B, Covid) Nasopharyngeal Swab     Status: Abnormal   Collection Time: 04/13/20 11:42 AM   Specimen: Nasopharyngeal Swab; Nasopharyngeal(NP) swabs in vial transport medium  Result Value Ref Range Status   SARS Coronavirus 2 by RT PCR POSITIVE (A) NEGATIVE Final    Comment: RESULT CALLED TO, READ BACK BY AND VERIFIED WITH: JASHIRA CARRION AT 1307 04/13/20.PMF (NOTE) SARS-CoV-2 target nucleic acids are DETECTED.  The SARS-CoV-2 RNA is generally detectable in upper respiratory specimens during the acute phase of infection. Positive results are indicative of the presence of the identified virus, but do not rule out bacterial infection or co-infection with other pathogens not detected by the test. Clinical correlation with patient history and other diagnostic information is necessary to determine patient infection status. The expected result is Negative.  Fact Sheet for  Patients: EntrepreneurPulse.com.au  Fact Sheet for Healthcare Providers: IncredibleEmployment.be  This test is not yet approved or cleared by the [place] FDA and  has been authorized for detection and/or diagnosis of SARS-CoV-2 by FDA under an Emergency Use Authorization (EUA).  This EUA will remain in effect (meaning this test can  be used) for the duration of  the COVID-19 declaration under Section 564(b)(1) of the Act, 21 U.S.C. section 360bbb-3(b)(1), unless the authorization is terminated or revoked sooner.     Influenza A by PCR NEGATIVE NEGATIVE Final   Influenza B by PCR NEGATIVE NEGATIVE Final    Comment: (NOTE) The Xpert Xpress SARS-CoV-2/FLU/RSV plus assay is intended as an aid in the diagnosis of influenza from Nasopharyngeal swab specimens and should not be used as a sole basis for treatment. Nasal washings and aspirates are unacceptable for Xpert Xpress SARS-CoV-2/FLU/RSV testing.  Fact Sheet for Patients: EntrepreneurPulse.com.au  Fact Sheet for Healthcare Providers: IncredibleEmployment.be  This test is not yet approved or cleared by the [place] FDA and has been authorized for detection and/or diagnosis of SARS-CoV-2 by FDA under an Emergency Use Authorization (EUA). This EUA will remain in effect (meaning this test can be used) for the duration of the COVID-19 declaration under Section 564(b)(1) of the Act, 21 U.S.C. section 360bbb-3(b)(1), unless the  authorization is terminated or revoked.  Performed at [facility], [address] [place], [place] [address]   Blood culture (routine x 2)     Status: None (Preliminary result)   Collection Time: 04/13/20  3:10 PM   Specimen: BLOOD  Result Value Ref Range Status   Specimen Description BLOOD LEFT ANTECUBITAL  Final   Special Requests   Final    BOTTLES DRAWN AEROBIC AND ANAEROBIC Blood Culture results may not be  optimal due to an excessive volume of blood received in culture bottles   Culture   Final    NO GROWTH 2 DAYS Performed at [facility], [address] [place], [place] [address]    Report Status PENDING  Incomplete  Blood culture (routine x 2)     Status: None (Preliminary result)   Collection Time: 04/13/20  3:10 PM   Specimen: BLOOD  Result Value Ref Range Status   Specimen Description BLOOD BLOOD LEFT FOREARM  Final   Special Requests   Final    BOTTLES DRAWN AEROBIC AND ANAEROBIC Blood Culture adequate volume   Culture   Final    NO GROWTH 2 DAYS Performed at [facility], [address] [place], [place] [address]    Report Status PENDING  Incomplete     Discharge Instructions:   Discharge Instructions    Diet - low sodium heart healthy   Complete by: As directed    Increase activity slowly   Complete by: As directed      Allergies as of 04/15/2020      Reactions   Bee Venom Anaphylaxis   Sulfa Antibiotics Other (See Comments)   Reaction:  Fainting    Duloxetine Nausea Only      Medication List    STOP taking these medications   hydrOXYzine 10 MG tablet Commonly known as: ATARAX/VISTARIL     TAKE these medications   amLODipine 5 MG tablet Commonly known as: NORVASC Take 5 mg by mouth daily.   aspirin 81 MG EC tablet Take 1 tablet (81 mg total) by mouth daily. Swallow whole.   atorvastatin 40 MG tablet Commonly known as: LIPITOR Take 40 mg by mouth daily.   busPIRone 7.5 MG tablet Commonly known as: BUSPAR Take 2 tablets by mouth in the morning and at bedtime.   EPINEPHrine 0.3 mg/0.3 mL Soaj injection Commonly known as: EPI-PEN Inject into the muscle.   furosemide 20 MG tablet Commonly known as: LASIX Take 3 tablets (60 mg total) by mouth daily. What changed:   how much to take  when to take this   lactulose 10 GM/15ML solution Commonly known as: CHRONULAC Take 20 g by mouth 2 (two) times daily as needed for mild  constipation.   pantoprazole 40 MG tablet Commonly known as: PROTONIX Take 1 tablet (40 mg total) by mouth daily.   propranolol 10 MG tablet Commonly known as: INDERAL Take 10 mg by mouth 2 (two) times daily.   rifaximin 550 MG Tabs tablet Commonly known as: XIFAXAN Take 1 tablet (550 mg total) by mouth 2 (two) times daily.   sertraline 25 MG tablet Commonly known as: ZOLOFT Take 25 mg by mouth daily.   spironolactone 50 MG tablet Commonly known as: ALDACTONE Take 3 tablets (150 mg total) by mouth daily.   Ventolin HFA 108 (90 Base) MCG/ACT inhaler Generic drug: albuterol Inhale 2 puffs into the lungs every 6 (six) hours as needed for wheezing or shortness of breath.  Time coordinating discharge: 32 minutes  Signed:  Llewellyn Choplin  [facility] 04/15/2020, 10:57 AM   Pager on [email or web address]. If 7PM-7AM, please contact night-coverage at [email or web address]

## 2020-04-18 LAB — CULTURE, BLOOD (ROUTINE X 2)
Culture: NO GROWTH
Culture: NO GROWTH
Special Requests: ADEQUATE

## 2020-04-20 DIAGNOSIS — H35033 Hypertensive retinopathy, bilateral: Secondary | ICD-10-CM | POA: Diagnosis not present

## 2020-04-20 DIAGNOSIS — H2513 Age-related nuclear cataract, bilateral: Secondary | ICD-10-CM | POA: Diagnosis not present

## 2020-04-20 DIAGNOSIS — H04129 Dry eye syndrome of unspecified lacrimal gland: Secondary | ICD-10-CM | POA: Diagnosis not present

## 2020-04-24 DIAGNOSIS — N183 Chronic kidney disease, stage 3 unspecified: Secondary | ICD-10-CM | POA: Diagnosis not present

## 2020-04-24 DIAGNOSIS — D696 Thrombocytopenia, unspecified: Secondary | ICD-10-CM | POA: Diagnosis not present

## 2020-04-24 DIAGNOSIS — R739 Hyperglycemia, unspecified: Secondary | ICD-10-CM | POA: Diagnosis not present

## 2020-04-24 DIAGNOSIS — R7989 Other specified abnormal findings of blood chemistry: Secondary | ICD-10-CM | POA: Diagnosis not present

## 2020-04-24 DIAGNOSIS — D472 Monoclonal gammopathy: Secondary | ICD-10-CM | POA: Diagnosis not present

## 2020-04-24 DIAGNOSIS — I8510 Secondary esophageal varices without bleeding: Secondary | ICD-10-CM | POA: Diagnosis not present

## 2020-04-24 DIAGNOSIS — I129 Hypertensive chronic kidney disease with stage 1 through stage 4 chronic kidney disease, or unspecified chronic kidney disease: Secondary | ICD-10-CM | POA: Diagnosis not present

## 2020-04-24 DIAGNOSIS — R69 Illness, unspecified: Secondary | ICD-10-CM | POA: Diagnosis not present

## 2020-04-24 DIAGNOSIS — E7849 Other hyperlipidemia: Secondary | ICD-10-CM | POA: Diagnosis not present

## 2020-04-25 ENCOUNTER — Telehealth: Payer: Self-pay

## 2020-04-25 NOTE — Telephone Encounter (Signed)
-----   Message from Lin Landsman, MD sent at 04/25/2020  7:27 AM EDT ----- Please goahead and start him on rifaximin 550mg  BID for hepatic encephalopathy I see that his Hb also dropped.  I'm on vacation until 4/5, happy to see him after I come back If he needs to be seen even sooner than that, he could be seen by one of our providers   Caryl Pina  Please schedule him to see me next week after I return as a hospital follow up   Thanks much RV    ----- Message ----- From: Adin Hector, MD Sent: 04/24/2020   5:18 PM EDT To: Lin Landsman, MD  Hi Dr. Marius Ditch,  Thank you for helping care for Mr. Mcneil; I believe he's scheduled to see you at the end of April.  I wanted to make sure you knew he'd been readmitted, this time with hepatic encephalopathy.  He had some small blood clots in emesis on 1 occasion, but it's not clear if that set this off or not.  He had a CT with question of colitis, but not really any symptoms of colitis.  I saw him in the office and adjusted his lactulose (he's been constipated), but wanted to send this to you in case you hadn't heard of his second admission, and in case you wanted to try to see him sooner/set up EGD.  Thank you!  Ramonita Lab

## 2020-04-25 NOTE — Telephone Encounter (Signed)
Tried to call patient but voicemail is full  

## 2020-04-25 NOTE — Telephone Encounter (Signed)
Patient has a prescription for Xifaxan with 4 refills at the pharmacy.

## 2020-04-25 NOTE — Telephone Encounter (Signed)
Tried to call patient but mailbox is full  

## 2020-04-26 NOTE — Telephone Encounter (Signed)
Tried to call patient but mailbox is full. Called wife that is on DPR form and voicemail is not set up. Sent mychart message

## 2020-05-01 LAB — FUNGUS CULTURE WITH STAIN

## 2020-05-01 LAB — FUNGAL ORGANISM REFLEX

## 2020-05-01 LAB — FUNGUS CULTURE RESULT

## 2020-05-07 DIAGNOSIS — B029 Zoster without complications: Secondary | ICD-10-CM | POA: Diagnosis not present

## 2020-05-15 DIAGNOSIS — J301 Allergic rhinitis due to pollen: Secondary | ICD-10-CM | POA: Diagnosis not present

## 2020-05-15 DIAGNOSIS — H6063 Unspecified chronic otitis externa, bilateral: Secondary | ICD-10-CM | POA: Diagnosis not present

## 2020-05-17 DIAGNOSIS — D649 Anemia, unspecified: Secondary | ICD-10-CM | POA: Diagnosis not present

## 2020-05-17 DIAGNOSIS — D472 Monoclonal gammopathy: Secondary | ICD-10-CM | POA: Diagnosis not present

## 2020-05-17 DIAGNOSIS — I8510 Secondary esophageal varices without bleeding: Secondary | ICD-10-CM | POA: Diagnosis not present

## 2020-05-17 DIAGNOSIS — I129 Hypertensive chronic kidney disease with stage 1 through stage 4 chronic kidney disease, or unspecified chronic kidney disease: Secondary | ICD-10-CM | POA: Diagnosis not present

## 2020-05-17 DIAGNOSIS — N183 Chronic kidney disease, stage 3 unspecified: Secondary | ICD-10-CM | POA: Diagnosis not present

## 2020-05-17 DIAGNOSIS — I10 Essential (primary) hypertension: Secondary | ICD-10-CM | POA: Diagnosis not present

## 2020-05-17 DIAGNOSIS — D696 Thrombocytopenia, unspecified: Secondary | ICD-10-CM | POA: Diagnosis not present

## 2020-05-17 DIAGNOSIS — E785 Hyperlipidemia, unspecified: Secondary | ICD-10-CM | POA: Diagnosis not present

## 2020-05-17 DIAGNOSIS — R69 Illness, unspecified: Secondary | ICD-10-CM | POA: Diagnosis not present

## 2020-05-17 DIAGNOSIS — K766 Portal hypertension: Secondary | ICD-10-CM | POA: Diagnosis not present

## 2020-05-28 ENCOUNTER — Other Ambulatory Visit: Payer: Self-pay

## 2020-05-28 ENCOUNTER — Ambulatory Visit: Payer: Medicare HMO | Admitting: Gastroenterology

## 2020-06-05 ENCOUNTER — Other Ambulatory Visit: Payer: Self-pay | Admitting: Gastroenterology

## 2020-06-06 DIAGNOSIS — K7031 Alcoholic cirrhosis of liver with ascites: Secondary | ICD-10-CM | POA: Diagnosis not present

## 2020-06-06 DIAGNOSIS — D472 Monoclonal gammopathy: Secondary | ICD-10-CM | POA: Diagnosis not present

## 2020-06-06 DIAGNOSIS — R69 Illness, unspecified: Secondary | ICD-10-CM | POA: Diagnosis not present

## 2020-06-06 DIAGNOSIS — H6063 Unspecified chronic otitis externa, bilateral: Secondary | ICD-10-CM | POA: Diagnosis not present

## 2020-06-06 DIAGNOSIS — R21 Rash and other nonspecific skin eruption: Secondary | ICD-10-CM | POA: Diagnosis not present

## 2020-06-06 DIAGNOSIS — I10 Essential (primary) hypertension: Secondary | ICD-10-CM | POA: Diagnosis not present

## 2020-06-06 DIAGNOSIS — L299 Pruritus, unspecified: Secondary | ICD-10-CM | POA: Diagnosis not present

## 2020-06-06 DIAGNOSIS — N183 Chronic kidney disease, stage 3 unspecified: Secondary | ICD-10-CM | POA: Diagnosis not present

## 2020-06-06 DIAGNOSIS — K766 Portal hypertension: Secondary | ICD-10-CM | POA: Diagnosis not present

## 2020-06-06 DIAGNOSIS — H6123 Impacted cerumen, bilateral: Secondary | ICD-10-CM | POA: Diagnosis not present

## 2020-06-06 DIAGNOSIS — D696 Thrombocytopenia, unspecified: Secondary | ICD-10-CM | POA: Diagnosis not present

## 2020-06-06 DIAGNOSIS — I129 Hypertensive chronic kidney disease with stage 1 through stage 4 chronic kidney disease, or unspecified chronic kidney disease: Secondary | ICD-10-CM | POA: Diagnosis not present

## 2020-06-12 ENCOUNTER — Encounter: Payer: Self-pay | Admitting: Medical Oncology

## 2020-06-12 ENCOUNTER — Emergency Department: Payer: Medicare HMO

## 2020-06-12 ENCOUNTER — Inpatient Hospital Stay
Admission: EM | Admit: 2020-06-12 | Discharge: 2020-06-15 | DRG: 433 | Disposition: A | Discharge status: Home / Self Care | Payer: Medicare HMO | Attending: Internal Medicine | Admitting: Internal Medicine

## 2020-06-12 ENCOUNTER — Other Ambulatory Visit: Payer: Self-pay

## 2020-06-12 DIAGNOSIS — Z7982 Long term (current) use of aspirin: Secondary | ICD-10-CM

## 2020-06-12 DIAGNOSIS — E872 Acidosis: Secondary | ICD-10-CM | POA: Diagnosis present

## 2020-06-12 DIAGNOSIS — K7030 Alcoholic cirrhosis of liver without ascites: Secondary | ICD-10-CM | POA: Diagnosis not present

## 2020-06-12 DIAGNOSIS — F32A Depression, unspecified: Secondary | ICD-10-CM | POA: Diagnosis present

## 2020-06-12 DIAGNOSIS — Z8249 Family history of ischemic heart disease and other diseases of the circulatory system: Secondary | ICD-10-CM

## 2020-06-12 DIAGNOSIS — K7040 Alcoholic hepatic failure without coma: Principal | ICD-10-CM | POA: Diagnosis present

## 2020-06-12 DIAGNOSIS — Z888 Allergy status to other drugs, medicaments and biological substances status: Secondary | ICD-10-CM

## 2020-06-12 DIAGNOSIS — I8510 Secondary esophageal varices without bleeding: Secondary | ICD-10-CM | POA: Diagnosis present

## 2020-06-12 DIAGNOSIS — Z823 Family history of stroke: Secondary | ICD-10-CM | POA: Diagnosis not present

## 2020-06-12 DIAGNOSIS — Z66 Do not resuscitate: Secondary | ICD-10-CM | POA: Diagnosis not present

## 2020-06-12 DIAGNOSIS — Z515 Encounter for palliative care: Secondary | ICD-10-CM | POA: Diagnosis not present

## 2020-06-12 DIAGNOSIS — F419 Anxiety disorder, unspecified: Secondary | ICD-10-CM | POA: Diagnosis present

## 2020-06-12 DIAGNOSIS — R404 Transient alteration of awareness: Secondary | ICD-10-CM | POA: Diagnosis not present

## 2020-06-12 DIAGNOSIS — Z8701 Personal history of pneumonia (recurrent): Secondary | ICD-10-CM | POA: Diagnosis not present

## 2020-06-12 DIAGNOSIS — E860 Dehydration: Secondary | ICD-10-CM | POA: Diagnosis not present

## 2020-06-12 DIAGNOSIS — Z882 Allergy status to sulfonamides status: Secondary | ICD-10-CM

## 2020-06-12 DIAGNOSIS — Z8673 Personal history of transient ischemic attack (TIA), and cerebral infarction without residual deficits: Secondary | ICD-10-CM | POA: Diagnosis not present

## 2020-06-12 DIAGNOSIS — I10 Essential (primary) hypertension: Secondary | ICD-10-CM | POA: Diagnosis not present

## 2020-06-12 DIAGNOSIS — I129 Hypertensive chronic kidney disease with stage 1 through stage 4 chronic kidney disease, or unspecified chronic kidney disease: Secondary | ICD-10-CM | POA: Diagnosis present

## 2020-06-12 DIAGNOSIS — Z7189 Other specified counseling: Secondary | ICD-10-CM | POA: Diagnosis not present

## 2020-06-12 DIAGNOSIS — R4182 Altered mental status, unspecified: Secondary | ICD-10-CM | POA: Diagnosis not present

## 2020-06-12 DIAGNOSIS — E785 Hyperlipidemia, unspecified: Secondary | ICD-10-CM | POA: Diagnosis not present

## 2020-06-12 DIAGNOSIS — Z8616 Personal history of COVID-19: Secondary | ICD-10-CM

## 2020-06-12 DIAGNOSIS — K219 Gastro-esophageal reflux disease without esophagitis: Secondary | ICD-10-CM | POA: Diagnosis present

## 2020-06-12 DIAGNOSIS — Z87891 Personal history of nicotine dependence: Secondary | ICD-10-CM

## 2020-06-12 DIAGNOSIS — I739 Peripheral vascular disease, unspecified: Secondary | ICD-10-CM | POA: Diagnosis present

## 2020-06-12 DIAGNOSIS — Z743 Need for continuous supervision: Secondary | ICD-10-CM | POA: Diagnosis not present

## 2020-06-12 DIAGNOSIS — R410 Disorientation, unspecified: Secondary | ICD-10-CM | POA: Diagnosis not present

## 2020-06-12 DIAGNOSIS — J45909 Unspecified asthma, uncomplicated: Secondary | ICD-10-CM | POA: Diagnosis present

## 2020-06-12 DIAGNOSIS — R69 Illness, unspecified: Secondary | ICD-10-CM | POA: Diagnosis not present

## 2020-06-12 DIAGNOSIS — U071 COVID-19: Secondary | ICD-10-CM | POA: Diagnosis present

## 2020-06-12 DIAGNOSIS — K7290 Hepatic failure, unspecified without coma: Secondary | ICD-10-CM | POA: Diagnosis not present

## 2020-06-12 DIAGNOSIS — N181 Chronic kidney disease, stage 1: Secondary | ICD-10-CM | POA: Diagnosis present

## 2020-06-12 DIAGNOSIS — K7682 Hepatic encephalopathy: Secondary | ICD-10-CM | POA: Diagnosis present

## 2020-06-12 DIAGNOSIS — Z79899 Other long term (current) drug therapy: Secondary | ICD-10-CM

## 2020-06-12 DIAGNOSIS — R531 Weakness: Secondary | ICD-10-CM | POA: Diagnosis not present

## 2020-06-12 DIAGNOSIS — J1282 Pneumonia due to coronavirus disease 2019: Secondary | ICD-10-CM | POA: Diagnosis present

## 2020-06-12 LAB — URINALYSIS, COMPLETE (UACMP) WITH MICROSCOPIC
Bacteria, UA: NONE SEEN
Bilirubin Urine: NEGATIVE
Glucose, UA: NEGATIVE mg/dL
Hgb urine dipstick: NEGATIVE
Ketones, ur: NEGATIVE mg/dL
Leukocytes,Ua: NEGATIVE
Nitrite: NEGATIVE
Protein, ur: NEGATIVE mg/dL
Specific Gravity, Urine: 1.012 (ref 1.005–1.030)
Squamous Epithelial / HPF: NONE SEEN (ref 0–5)
pH: 6 (ref 5.0–8.0)

## 2020-06-12 LAB — CBC WITH DIFFERENTIAL/PLATELET
Abs Immature Granulocytes: 0.02 10*3/uL (ref 0.00–0.07)
Basophils Absolute: 0.1 10*3/uL (ref 0.0–0.1)
Basophils Relative: 1 %
Eosinophils Absolute: 0.4 10*3/uL (ref 0.0–0.5)
Eosinophils Relative: 7 %
HCT: 29.1 % — ABNORMAL LOW (ref 39.0–52.0)
Hemoglobin: 9.9 g/dL — ABNORMAL LOW (ref 13.0–17.0)
Immature Granulocytes: 0 %
Lymphocytes Relative: 21 %
Lymphs Abs: 1.3 10*3/uL (ref 0.7–4.0)
MCH: 29.6 pg (ref 26.0–34.0)
MCHC: 34 g/dL (ref 30.0–36.0)
MCV: 87.1 fL (ref 80.0–100.0)
Monocytes Absolute: 0.9 10*3/uL (ref 0.1–1.0)
Monocytes Relative: 14 %
Neutro Abs: 3.5 10*3/uL (ref 1.7–7.7)
Neutrophils Relative %: 57 %
Platelets: 128 10*3/uL — ABNORMAL LOW (ref 150–400)
RBC: 3.34 MIL/uL — ABNORMAL LOW (ref 4.22–5.81)
RDW: 16.6 % — ABNORMAL HIGH (ref 11.5–15.5)
WBC: 6.2 10*3/uL (ref 4.0–10.5)
nRBC: 0 % (ref 0.0–0.2)

## 2020-06-12 LAB — BASIC METABOLIC PANEL
Anion gap: 9 (ref 5–15)
BUN: 21 mg/dL (ref 8–23)
CO2: 20 mmol/L — ABNORMAL LOW (ref 22–32)
Calcium: 9 mg/dL (ref 8.9–10.3)
Chloride: 104 mmol/L (ref 98–111)
Creatinine, Ser: 1.36 mg/dL — ABNORMAL HIGH (ref 0.61–1.24)
GFR, Estimated: 56 mL/min — ABNORMAL LOW (ref >60)
Glucose, Bld: 125 mg/dL — ABNORMAL HIGH (ref 70–99)
Potassium: 4.1 mmol/L (ref 3.5–5.1)
Sodium: 133 mmol/L — ABNORMAL LOW (ref 135–145)

## 2020-06-12 LAB — URINE DRUG SCREEN, QUALITATIVE (ARMC ONLY)
Amphetamines, Ur Screen: NOT DETECTED
Barbiturates, Ur Screen: NOT DETECTED
Benzodiazepine, Ur Scrn: NOT DETECTED
Cannabinoid 50 Ng, Ur ~~LOC~~: NOT DETECTED
Cocaine Metabolite,Ur ~~LOC~~: NOT DETECTED
MDMA (Ecstasy)Ur Screen: NOT DETECTED
Methadone Scn, Ur: NOT DETECTED
Opiate, Ur Screen: NOT DETECTED
Phencyclidine (PCP) Ur S: NOT DETECTED
Tricyclic, Ur Screen: NOT DETECTED

## 2020-06-12 LAB — HEPATIC FUNCTION PANEL
ALT: 25 U/L (ref 0–44)
AST: 49 U/L — ABNORMAL HIGH (ref 15–41)
Albumin: 2.9 g/dL — ABNORMAL LOW (ref 3.5–5.0)
Alkaline Phosphatase: 90 U/L (ref 38–126)
Bilirubin, Direct: 0.4 mg/dL — ABNORMAL HIGH (ref 0.0–0.2)
Indirect Bilirubin: 1 mg/dL — ABNORMAL HIGH (ref 0.3–0.9)
Total Bilirubin: 1.4 mg/dL — ABNORMAL HIGH (ref 0.3–1.2)
Total Protein: 6.7 g/dL (ref 6.5–8.1)

## 2020-06-12 LAB — LACTIC ACID, PLASMA
Lactic Acid, Venous: 2.9 mmol/L (ref 0.5–1.9)
Lactic Acid, Venous: 3 mmol/L (ref 0.5–1.9)

## 2020-06-12 LAB — AMMONIA: Ammonia: 138 umol/L — ABNORMAL HIGH (ref 9–35)

## 2020-06-12 LAB — PROTIME-INR
INR: 1.4 — ABNORMAL HIGH (ref 0.8–1.2)
Prothrombin Time: 17 seconds — ABNORMAL HIGH (ref 11.4–15.2)

## 2020-06-12 LAB — TROPONIN I (HIGH SENSITIVITY): Troponin I (High Sensitivity): 10 ng/L (ref <18)

## 2020-06-12 MED ORDER — SERTRALINE HCL 50 MG PO TABS
25.0000 mg | ORAL_TABLET | Freq: Every day | ORAL | Status: DC
  Administered 2020-06-13 – 2020-06-15 (×3): 25 mg via ORAL
  Filled 2020-06-12 (×3): qty 1

## 2020-06-12 MED ORDER — SPIRONOLACTONE 100 MG PO TABS
150.0000 mg | ORAL_TABLET | Freq: Every day | ORAL | Status: DC
  Administered 2020-06-13 – 2020-06-15 (×3): 150 mg via ORAL
  Filled 2020-06-12 (×2): qty 6
  Filled 2020-06-12: qty 2
  Filled 2020-06-12: qty 1.5

## 2020-06-12 MED ORDER — FUROSEMIDE 40 MG PO TABS
60.0000 mg | ORAL_TABLET | Freq: Every day | ORAL | Status: DC
  Administered 2020-06-13 – 2020-06-15 (×3): 60 mg via ORAL
  Filled 2020-06-12 (×3): qty 1
  Filled 2020-06-12: qty 2

## 2020-06-12 MED ORDER — ENOXAPARIN SODIUM 40 MG/0.4ML IJ SOSY
40.0000 mg | PREFILLED_SYRINGE | INTRAMUSCULAR | Status: DC
  Administered 2020-06-12 – 2020-06-14 (×3): 40 mg via SUBCUTANEOUS
  Filled 2020-06-12 (×3): qty 0.4

## 2020-06-12 MED ORDER — RIFAXIMIN 550 MG PO TABS
550.0000 mg | ORAL_TABLET | Freq: Two times a day (BID) | ORAL | Status: DC
  Administered 2020-06-12 – 2020-06-15 (×6): 550 mg via ORAL
  Filled 2020-06-12 (×7): qty 1

## 2020-06-12 MED ORDER — PROPRANOLOL HCL 10 MG PO TABS
10.0000 mg | ORAL_TABLET | Freq: Two times a day (BID) | ORAL | Status: DC
  Administered 2020-06-12 – 2020-06-15 (×6): 10 mg via ORAL
  Filled 2020-06-12 (×7): qty 1

## 2020-06-12 MED ORDER — SODIUM CHLORIDE 0.9 % IV SOLN: 250.0000 mL | INTRAVENOUS | Status: DC | PRN

## 2020-06-12 MED ORDER — SODIUM CHLORIDE 0.9 % IV SOLN
2.0000 g | INTRAVENOUS | Status: DC
  Administered 2020-06-12 – 2020-06-14 (×3): 2 g via INTRAVENOUS
  Filled 2020-06-12: qty 2
  Filled 2020-06-12 (×2): qty 20
  Filled 2020-06-12: qty 2

## 2020-06-12 MED ORDER — SODIUM CHLORIDE 0.9 % IV SOLN: INTRAVENOUS | Status: AC

## 2020-06-12 MED ORDER — ALBUTEROL SULFATE (2.5 MG/3ML) 0.083% IN NEBU: 2.5000 mg | INHALATION_SOLUTION | Freq: Four times a day (QID) | RESPIRATORY_TRACT | Status: DC | PRN

## 2020-06-12 MED ORDER — BUSPIRONE HCL 15 MG PO TABS
15.0000 mg | ORAL_TABLET | Freq: Two times a day (BID) | ORAL | Status: DC
  Administered 2020-06-12 – 2020-06-14 (×4): 15 mg via ORAL
  Filled 2020-06-12 (×5): qty 1

## 2020-06-12 MED ORDER — SODIUM CHLORIDE 0.9 % IV BOLUS
1000.0000 mL | Freq: Once | INTRAVENOUS | Status: AC
  Administered 2020-06-12: 1000 mL via INTRAVENOUS

## 2020-06-12 MED ORDER — PANTOPRAZOLE SODIUM 40 MG PO TBEC
40.0000 mg | DELAYED_RELEASE_TABLET | Freq: Every day | ORAL | Status: DC
  Administered 2020-06-13 – 2020-06-15 (×3): 40 mg via ORAL
  Filled 2020-06-12 (×4): qty 1

## 2020-06-12 MED ORDER — LACTULOSE 10 GM/15ML PO SOLN
30.0000 g | Freq: Once | ORAL | Status: AC
  Administered 2020-06-12: 30 g via ORAL
  Filled 2020-06-12: qty 60

## 2020-06-12 MED ORDER — ASPIRIN EC 81 MG PO TBEC
81.0000 mg | DELAYED_RELEASE_TABLET | Freq: Every day | ORAL | Status: DC
  Administered 2020-06-13 – 2020-06-15 (×3): 81 mg via ORAL
  Filled 2020-06-12 (×3): qty 1

## 2020-06-12 MED ORDER — ATORVASTATIN CALCIUM 20 MG PO TABS
40.0000 mg | ORAL_TABLET | Freq: Every day | ORAL | Status: DC
  Administered 2020-06-12 – 2020-06-14 (×3): 40 mg via ORAL
  Filled 2020-06-12 (×3): qty 2

## 2020-06-12 MED ORDER — LACTULOSE 10 GM/15ML PO SOLN
20.0000 g | Freq: Three times a day (TID) | ORAL | Status: DC
  Administered 2020-06-12 – 2020-06-14 (×6): 20 g via ORAL
  Filled 2020-06-12 (×6): qty 30

## 2020-06-12 MED ORDER — SODIUM CHLORIDE 0.9% FLUSH
3.0000 mL | Freq: Two times a day (BID) | INTRAVENOUS | Status: DC
  Administered 2020-06-12 – 2020-06-15 (×4): 3 mL via INTRAVENOUS

## 2020-06-12 MED ORDER — SODIUM CHLORIDE 0.9% FLUSH: 3.0000 mL | INTRAVENOUS | Status: DC | PRN

## 2020-06-12 NOTE — ED Notes (Signed)
This RN rounded on pt and found him standing at the edge of the bed. RN called for assistance. RN and tech got pt cleaned up and put back into bed.

## 2020-06-12 NOTE — ED Triage Notes (Signed)
Pt from home via ems- pt lives with sister who reports that pt began yesterday having altered mental status. Pt A/O x 2. Able to follow commands. Pt denies pain.

## 2020-06-12 NOTE — ED Notes (Signed)
Patient transported to CT 

## 2020-06-12 NOTE — ED Notes (Signed)
Pt is unable to follow commands to swallow pills. Pt is able to take small sips of water only.   Pt is pocketing pill.   RN will attempt again when pt is more alert.

## 2020-06-12 NOTE — Progress Notes (Signed)
Patients caregiver stated that patient only wanted immediate family to visit, and there was someone name Margarita Grizzle that is a friend he does not want in room. I went in room and spoke with patient and he confirmed he did not want his friend Margarita Grizzle to come and visit him.

## 2020-06-12 NOTE — ED Notes (Signed)
Pt remains drowsy and unable to keep conversation with RN. MD aware.

## 2020-06-12 NOTE — ED Notes (Signed)
Pt back from CT

## 2020-06-12 NOTE — H&P (Addendum)
History and Physical    Jeff Ward WJ:[id number] DOB: 06/20/1950 DOA: 06/12/2020  PCP: Adin Hector, MD   Patient coming from: Home  I have personally briefly reviewed patient's old medical records in [place]  Chief Complaint: Change in mental status Most of the history is obtained from ER notes.  Unable to reach patient's sister on the phone  HPI: Jeff Ward is a 70 y.o. male with medical history significant foralcoholic liver cirrhosis, GERD, dyslipidemia, depression who presents to the ER via EMS for evaluation of mental status changes that family states started 1 day prior to his admission.  Patient is lying in bed and is awake alert oriented only to person.  He denies being in any pain and is unable to tell me why he is here. I am unable to do a review of systems due to his mental status. Labs show sodium 133, potassium 4.1, chloride 104, bicarb 20, glucose 125, BUN 21, creatinine 1.36 above his baseline of 1.04, calcium 9.0, alkaline phosphatase 90, albumin 2.9, AST 49, ALT 25, total protein 6.7, ammonia 138, troponin 10, white count 6.2, hemoglobin 9.9, hematocrit 29.1, MCV 87.1 PW 16.6, platelet count 128, PT 17.0, INR 1.4 Chest x-ray reviewed by me shows no acute pulmonary disease. CT scan of the head without contrast shows  Slight periventricular small vessel disease. Prior tiny lacunar infarct in the head of the caudate nucleus on the right. No mass or hemorrhage. No acute infarct evident. There are foci of arterial vascular calcification. Foci of ethmoid paranasal sinus disease noted. Respiratory viral panel is pending    ED Course: Patient is a 70 year old Caucasian male with a history of liver cirrhosis who was brought into the ER by EMS for evaluation of change in mental status.  Patient is oriented only to person not to place or time.  Ammonia level is elevated at 138.  He appears to be slightly dehydrated with a bump in his serum Creatinine from 1.04 to  1.36.  He will be admitted to the hospital for further evaluation.    Review of Systems: As per HPI otherwise all other systems reviewed and negative.    Past Medical History:  Diagnosis Date  . Allergy    SEASONAL  . Anemia   . Anxiety   . Arthritis    In back and all joints  . Asthma   . Chronic headache    now resolved  . CKD (chronic kidney disease)   . 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 Depression   . Esophageal varices ([place])   . GERD (gastroesophageal reflux disease)   . GI bleed   . Hiatal hernia   . History of kidney stones 1970's  . Hyperlipidemia 06/2002  . Hypertension   . Liver disease   . Psoriasis   . Stroke ([place])    PT. STATED MILD  . Substance abuse ([place])    H/O ETOH    Past Surgical History:  Procedure Laterality Date  . CARPAL TUNNEL RELEASE  2005    / right hand  . COLONOSCOPY     march 2016  . ESOPHAGEAL BANDING  09/22/2017   Procedure: ESOPHAGEAL BANDING;  Surgeon: Ladene Artist, MD;  Location: [facility];  Service: Endoscopy;;  . ESOPHAGEAL BANDING  10/11/2018   Procedure: ESOPHAGEAL BANDING;  Surgeon: Ladene Artist, MD;  Location: [facility];  Service: Endoscopy;;  . ESOPHAGOGASTRODUODENOSCOPY N/A 07/24/2015   Procedure: ESOPHAGOGASTRODUODENOSCOPY (EGD);  Surgeon: Lucilla Lame, MD;  Location: [facility] END[facility]COPY;  Service: Endoscopy;  Laterality: N/A;  Banding  . ESOPHAGOGASTRODUODENOSCOPY (EGD) WITH PROPOFOL N/A 08/14/2015   Procedure: ESOPHAGOGASTRODUODENOSCOPY (EGD) WITH PROPOFOL;  Surgeon: Ladene Artist, MD;  Location: [facility]OSCOPY;  Service: Endoscopy;  Laterality: N/A;  . ESOPHAGOGASTRODUODENOSCOPY (EGD) WITH PROPOFOL N/A 09/22/2017   Procedure: ESOPHAGOGASTRODUODENOSCOPY (EGD) WITH PROPOFOL;  Surgeon: Ladene Artist, MD;  Location: [facility];  Service: Endoscopy;  Laterality: N/A;  . ESOPHAGOGASTRODUODENOSCOPY (EGD) WITH PROPOFOL N/A 10/11/2018   Procedure: ESOPHAGOGASTRODUODENOSCOPY (EGD) WITH PROPOFOL;   Surgeon: Ladene Artist, MD;  Location: [facility]COPY;  Service: Endoscopy;  Laterality: N/A;  . LEFT HEART CATH N/A 04/02/2020   Procedure: Left Heart Cath and Coronary Angiography;  Surgeon: Isaias Cowman, MD;  Location: [facility];  Service: Cardiovascular;  Laterality: N/A;  . TONSILLECTOMY AND ADENOIDECTOMY     70 years old  . VASECTOMY       reports that he quit smoking about 10 years ago. His smoking use included cigars. He quit after 35.00 years of use. He has never used smokeless tobacco. He reports that he does not drink alcohol and does not use drugs.  Allergies  Allergen Reactions  . Bee Venom Anaphylaxis  . Sulfa Antibiotics Other (See Comments)    Reaction:  Fainting   . Duloxetine Nausea Only    Family History  Problem Relation Age of Onset  . CAD Mother   . CVA Mother   . Heart disease Mother   . Melanoma Father   . CVA Father   . Liver disease Brother   . Breast cancer Paternal Aunt   . Alcohol abuse Maternal Grandfather       Prior to Admission medications   Medication Sig Start Date End Date Taking? Authorizing Provider  amLODipine (NORVASC) 5 MG tablet Take 5 mg by mouth daily. 03/21/20   [provider]  aspirin EC 81 MG EC tablet Take 1 tablet (81 mg total) by mouth daily. Swallow whole. 04/07/20   Fritzi Mandes, MD  atorvastatin (LIPITOR) 40 MG tablet Take 40 mg by mouth daily. 12/28/19   [provider]  busPIRone (BUSPAR) 7.5 MG tablet Take 2 tablets by mouth in the morning and at bedtime. 12/27/19   [provider]  EPINEPHrine 0.3 mg/0.3 mL IJ SOAJ injection Inject into the muscle.    [provider]  furosemide (LASIX) 20 MG tablet Take 3 tablets (60 mg total) by mouth daily. Patient taking differently: Take 20 mg by mouth in the morning, at noon, and at bedtime. 04/06/20   Fritzi Mandes, MD  ketoconazole (NIZORAL) 2 % cream Apply topically. 09/23/19 09/22/20  [provider]  lactulose (CHRONULAC)  10 GM/15ML solution TAKE 20 MLS (13.3333 G TOTAL) BY MOUTH IN THE MORNING AND AT BEDTIME. 06/05/20   Ladene Artist, MD  pantoprazole (PROTONIX) 40 MG tablet Take 1 tablet (40 mg total) by mouth daily. 03/05/16   Ladene Artist, MD  propranolol (INDERAL) 10 MG tablet Take 10 mg by mouth 2 (two) times daily. 03/04/20   [provider]  rifaximin (XIFAXAN) 550 MG TABS tablet Take 1 tablet (550 mg total) by mouth 2 (two) times daily. Patient not taking: No sig reported 04/06/20   Fritzi Mandes, MD  sertraline (ZOLOFT) 25 MG tablet Take 25 mg by mouth daily. 02/23/20   [provider]  spironolactone (ALDACTONE) 50 MG tablet Take 3 tablets (150 mg total) by mouth daily. 04/06/20  Fritzi Mandes, MD  triamcinolone cream (KENALOG) 0.1 % APPLY TO AFFECTED AREA TWICE A DAY 09/06/19   [provider]  VENTOLIN HFA 108 (90 Base) MCG/ACT inhaler Inhale 2 puffs into the lungs every 6 (six) hours as needed for wheezing or shortness of breath.  07/28/16   [provider]    Physical Exam: Vitals:   06/12/20 0835 06/12/20 0900 06/12/20 1013 06/12/20 1015  BP:  (!) 117/59  121/65  Pulse:  67  69  Resp:  17  17  Temp:   98.4 F (36.9 C)   TempSrc:   Rectal   SpO2:  100%  100%  Weight: 75 kg     Height: 6' (1.829 m)        Vitals:   06/12/20 0835 06/12/20 0900 06/12/20 1013 06/12/20 1015  BP:  (!) 117/59  121/65  Pulse:  67  69  Resp:  17  17  Temp:   98.4 F (36.9 C)   TempSrc:   Rectal   SpO2:  100%  100%  Weight: 75 kg     Height: 6' (1.829 m)         Constitutional: Alert and oriented only to person not to place or time. Not in any apparent distress HEENT:      Head: Normocephalic and atraumatic.         Eyes: PERLA, EOMI, Conjunctivae pallor. Sclera is non-icteric.       Mouth/Throat: Mucous membranes are dry.       Neck: Supple with no signs of meningismus. Cardiovascular: Regular rate and rhythm. No murmurs, gallops, or rubs. 2+ symmetrical distal pulses  are present . No JVD. No LE edema Respiratory: Respiratory effort normal .Lungs sounds clear bilaterally. No wheezes, crackles, or rhonchi.  Gastrointestinal: Soft, epigastric tenderness, and non distended with positive bowel sounds.  Genitourinary: No CVA tenderness. Musculoskeletal: Nontender with normal range of motion in all extremities. No cyanosis, or erythema of extremities. Neurologic:  Face is symmetric. Moving all extremities. No gross focal neurologic deficits  Skin: Skin is warm, dry.  No rash or ulcers Psychiatric: Unable to assess   Labs on Admission: I have personally reviewed following labs and imaging studies  CBC: Recent Labs  Lab 06/12/20 0834  WBC 6.2  NEUTROABS 3.5  HGB 9.9*  HCT 29.1*  MCV 87.1  PLT 185*   Basic Metabolic Panel: Recent Labs  Lab 06/12/20 0834  NA 133*  K 4.1  CL 104  CO2 20*  GLUCOSE 125*  BUN 21  CREATININE 1.36*  CALCIUM 9.0   GFR: Estimated Creatinine Clearance: 53.6 mL/min (A) (by C-G formula based on SCr of 1.36 mg/dL (H)). Liver Function Tests: Recent Labs  Lab 06/12/20 0834  AST 49*  ALT 25  ALKPHOS 90  BILITOT 1.4*  PROT 6.7  ALBUMIN 2.9*   No results for input(s): LIPASE, AMYLASE in the last 168 hours. Recent Labs  Lab 06/12/20 0834  AMMONIA 138*   Coagulation Profile: Recent Labs  Lab 06/12/20 0920  INR 1.4*   Cardiac Enzymes: No results for input(s): CKTOTAL, CKMB, CKMBINDEX, TROPONINI in the last 168 hours. BNP (last 3 results) No results for input(s): PROBNP in the last 8760 hours. HbA1C: No results for input(s): HGBA1C in the last 72 hours. CBG: No results for input(s): GLUCAP in the last 168 hours. Lipid Profile: No results for input(s): CHOL, HDL, LDLCALC, TRIG, CHOLHDL, LDLDIRECT in the last 72 hours. Thyroid Function Tests: No results for input(s): TSH, T4TOTAL,  FREET4, T3FREE, THYROIDAB in the last 72 hours. Anemia Panel: No results for input(s): VITAMINB12, FOLATE, FERRITIN, TIBC,  IRON, RETICCTPCT in the last 72 hours. Urine analysis:    Component Value Date/Time   COLORURINE YELLOW (A) 06/12/2020 1003   APPEARANCEUR CLEAR (A) 06/12/2020 1003   LABSPEC 1.012 06/12/2020 1003   PHURINE 6.0 06/12/2020 1003   GLUCOSEU NEGATIVE 06/12/2020 1003   HGBUR NEGATIVE 06/12/2020 1003   [place] 06/12/2020 1003   [place] 06/12/2020 1003   PROTEINUR NEGATIVE 06/12/2020 1003   NITRITE NEGATIVE 06/12/2020 1003   [place] 06/12/2020 1003    Radiological Exams on Admission: CT Head Wo Contrast  Result Date: 06/12/2020 CLINICAL DATA:  Altered mental status EXAM: CT HEAD WITHOUT CONTRAST TECHNIQUE: Contiguous axial images were obtained from the base of the skull through the vertex without intravenous contrast. COMPARISON:  July 26, 2015 head CT; brain MRI August 10, 2015 FINDINGS: Brain: There is stable age related volume loss. There is no intracranial mass, hemorrhage, extra-axial fluid collection, or midline shift. Evidence of prior tiny lacunar infarct in head of the caudate nucleus on the right. There is slight decreased attenuation in portions of the centra semiovale. No acute infarct is evident. Vascular: No hyperdense vessel. There is calcification in each carotid siphon region. Skull: Bony calvarium appears intact. Sinuses/Orbits: There is opacification in in an anterior right ethmoid air cell. There is mild mucosal thickening in several ethmoid air cells. Orbits appear symmetric bilaterally. Other: Visualized mastoid air cells are clear. IMPRESSION: Slight periventricular small vessel disease. Prior tiny lacunar infarct in the head of the caudate nucleus on the right. No mass or hemorrhage. No acute infarct evident. There are foci of arterial vascular calcification. Foci of ethmoid paranasal sinus disease noted. Electronically Signed   By: Lowella Grip III M.D.   On: 06/12/2020 09:42   DG Chest Portable 1 View  Result Date:  06/12/2020 CLINICAL DATA:  Weakness, altered mental status EXAM: PORTABLE CHEST 1 VIEW COMPARISON:  04/13/2020 FINDINGS: Heart is normal size. Aortic atherosclerosis. No confluent opacities or effusions. No acute bony abnormality. IMPRESSION: No active disease. Electronically Signed   By: Rolm Baptise M.D.   On: 06/12/2020 08:57     Assessment/Plan Principal Problem:   Hepatic encephalopathy (HCC) Active Problems:   Essential hypertension, benign   Depression   GERD (gastroesophageal reflux disease)   Alcoholic cirrhosis (HCC)   Dehydration     Hepatic encephalopathy Secondary to known alcoholic cirrhosis of the liver We will place patient on scheduled lactulose and titrate for 2-3 soft stools Continue rifaximin Place patient empirically on Rocephin 2 g IV daily for SBP prophylaxis Continue propranolol, spironolactone and furosemide in a.m.    Depression Continue buspirone and sertraline    Dehydration Patient noted to have a bump in his serum creatinine from 1.04 to1.36 as well as elevated BUN levels. Mucous membranes are dry Gentle IV fluid hydration Repeat renal parameters in a.m.      GERD Continue Protonix    Lactic acidosis Unclear etiology No evidence of an infectious process at this time at this time Initial lactic acid level was 2.9, will repeat lactic acid levels   DVT prophylaxis: Lovenox Code Status: full code Family Communication: Attempted to reach patient's sister and healthcare power of attorney over the phone, no response, awaiting callback Disposition Plan: Back to previous home environment Consults called: none Status: At the time of admission, it appears that the appropriate admission status for this patient is inpatient.  This is judged to be reasonable and necessary in order to provide the required intensity of service to ensure the patient's safety given the presenting symptoms, physical exam findings, and initial radiographic and  laboratory data in the context of their comorbid conditions. Patient requires inpatient status due to high intensity of service, high risk for further deterioration and high frequency of surveillance required.     Collier Bullock MD [facility]     06/12/2020, 11:59 AM

## 2020-06-12 NOTE — ED Notes (Signed)
Pt has urinal and states he will "try to pee"

## 2020-06-12 NOTE — ED Provider Notes (Signed)
[facility] Emergency Department Provider Note ____________________________________________   Event Date/Time   First MD Initiated Contact with Patient 06/12/20 [id number]     (approximate)  I have reviewed the triage vital signs and the nursing notes.   HISTORY  Chief Complaint Altered Mental Status  Level 5 caveat: History of present illness limited due to altered mental status  HPI Jeff Ward is a 70 y.o. male with PMH as noted below including CKD, hypertension, alcoholic liver cirrhosis and hepatic encephalopathy, who presents with altered mental status since yesterday.  Family noted him to be more confused and combative.  He is apparently alert and oriented at baseline.  The patient himself reported to EMS that he was dizzy.  He denies any complaints to [place] here.  He is unable to give any other history.  EMS states that his blood pressure was in initially normal but then went down to the 90s/50s on the way in.  Past Medical History:  Diagnosis Date  . Allergy    SEASONAL  . Anemia   . Anxiety   . Arthritis    In back and all joints  . Asthma   . Chronic headache    now resolved  . CKD (chronic kidney disease)   . 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 Depression   . Esophageal varices ([place])   . GERD (gastroesophageal reflux disease)   . GI bleed   . Hiatal hernia   . History of kidney stones 1970's  . Hyperlipidemia 06/2002  . Hypertension   . Liver disease   . Psoriasis   . Stroke ([place])    PT. STATED MILD  . Substance abuse ([place])    H/O ETOH    Patient Active Problem List   Diagnosis Date Noted  . Dehydration 06/12/2020  . Pneumonia due to COVID-19 virus 06/12/2020  . Hepatic encephalopathy ([place]) 04/13/2020  . Encephalopathy acute 04/13/2020  . Portal hypertension ([place]) 04/13/2020  . Physical deconditioning 04/13/2020  . NSTEMI (non-ST elevated myocardial infarction) ([place]) 04/01/2020  . Hypokalemia 02/29/2020   . Hypotension 02/29/2020  . Prolonged QT interval 02/29/2020  . Symptomatic anemia 02/29/2020  . Occult blood in stools 02/29/2020  . Thrombocytopenia ([place]) 02/29/2020  . SI (sacroiliac) joint inflammation ([place]) 03/31/2016  . Encephalopathy, hepatic ([place]) 03/05/2016  . Esophageal varices without bleeding ([place]) 03/05/2016  . Right foot pain 01/31/2016  . Hyperkalemia 01/31/2016  . Alcoholic cirrhosis of liver with ascites ([place]) 08/06/2015  . Secondary esophageal varices with bleeding ([place])   . Alcoholic cirrhosis ([place]) [id number]  . Liver cirrhosis ([place]) 06/19/2015  . GERD (gastroesophageal reflux disease) 03/22/2015  . Personal history of colonic polyps 04/27/2013  . Depression 02/17/2013  . Hyperglycemia 07/23/2011  . OSA (obstructive sleep apnea) 04/10/2011  . Testosterone deficiency 03/06/2011  . Rectal bleeding 01/09/2011  . Degenerative disc disease, lumbar 05/09/2010  . OTHER NONSPECIFIC ABNORMAL SERUM ENZYME LEVELS 05/23/2009  . Essential hypertension, benign 09/01/2007  . COLONIC POLYPS 10/23/2006  . HLD (hyperlipidemia) 10/23/2006  . PUD 10/23/2006  . 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 CARPAL TUNNEL SYNDROME, BILATERAL, HX OF 10/23/2006  . BENIGN PROSTATIC HYPERTROPHY, HX OF 10/23/2006    Past Surgical History:  Procedure Laterality Date  . CARPAL TUNNEL RELEASE  2005    / right hand  . COLONOSCOPY     march 2016  . ESOPHAGEAL BANDING  09/22/2017   Procedure: ESOPHAGEAL BANDING;  Surgeon: Ladene Artist, MD;  Location: [facility];  Service: Endoscopy;;  . ESOPHAGEAL BANDING  10/11/2018   Procedure: ESOPHAGEAL BANDING;  Surgeon: Ladene Artist, MD;  Location: [facility] ENDOSCOPY;  Service: Endoscopy;;  . ESOPHAGOGASTRODUODENOSCOPY N/A 07/24/2015   Procedure: ESOPHAGOGASTRODUODENOSCOPY (EGD);  Surgeon: Lucilla Lame, MD;  Location: [facility] ENDOSCOPY;  Service: Endoscopy;  Laterality: N/A;  Banding  . ESOPHAGOGASTRODUODENOSCOPY (EGD) WITH PROPOFOL N/A 08/14/2015    Procedure: ESOPHAGOGASTRODUODENOSCOPY (EGD) WITH PROPOFOL;  Surgeon: Ladene Artist, MD;  Location: [facility] ENDOSCOPY;  Service: Endoscopy;  Laterality: N/A;  . ESOPHAGOGASTRODUODENOSCOPY (EGD) WITH PROPOFOL N/A 09/22/2017   Procedure: ESOPHAGOGASTRODUODENOSCOPY (EGD) WITH PROPOFOL;  Surgeon: Ladene Artist, MD;  Location: [facility] ENDOSCOPY;  Service: Endoscopy;  Laterality: N/A;  . ESOPHAGOGASTRODUODENOSCOPY (EGD) WITH PROPOFOL N/A 10/11/2018   Procedure: ESOPHAGOGASTRODUODENOSCOPY (EGD) WITH PROPOFOL;  Surgeon: Ladene Artist, MD;  Location: [facility] ENDOSCOPY;  Service: Endoscopy;  Laterality: N/A;  . LEFT HEART CATH N/A 04/02/2020   Procedure: Left Heart Cath and Coronary Angiography;  Surgeon: Isaias Cowman, MD;  Location: [facility] CV LAB;  Service: Cardiovascular;  Laterality: N/A;  . TONSILLECTOMY AND ADENOIDECTOMY     70 years old  . VASECTOMY      Prior to Admission medications   Medication Sig Start Date End Date Taking? Authorizing Provider  amLODipine (NORVASC) 5 MG tablet Take 5 mg by mouth daily. 03/21/20   [provider]  aspirin EC 81 MG EC tablet Take 1 tablet (81 mg total) by mouth daily. Swallow whole. 04/07/20   Fritzi Mandes, MD  atorvastatin (LIPITOR) 40 MG tablet Take 40 mg by mouth daily. 12/28/19   [provider]  busPIRone (BUSPAR) 7.5 MG tablet Take 2 tablets by mouth in the morning and at bedtime. 12/27/19   [provider]  EPINEPHrine 0.3 mg/0.3 mL IJ SOAJ injection Inject into the muscle.    [provider]  furosemide (LASIX) 20 MG tablet Take 3 tablets (60 mg total) by mouth daily. Patient taking differently: Take 20 mg by mouth in the morning, at noon, and at bedtime. 04/06/20   Fritzi Mandes, MD  ketoconazole (NIZORAL) 2 % cream Apply topically. 09/23/19 09/22/20  [provider]  lactulose (CHRONULAC) 10 GM/15ML solution TAKE 20 MLS (13.3333 G TOTAL) BY MOUTH IN THE MORNING AND AT BEDTIME. 06/05/20   Ladene Artist, MD   pantoprazole (PROTONIX) 40 MG tablet Take 1 tablet (40 mg total) by mouth daily. 03/05/16   Ladene Artist, MD  propranolol (INDERAL) 10 MG tablet Take 10 mg by mouth 2 (two) times daily. 03/04/20   [provider]  rifaximin (XIFAXAN) 550 MG TABS tablet Take 1 tablet (550 mg total) by mouth 2 (two) times daily. Patient not taking: No sig reported 04/06/20   Fritzi Mandes, MD  sertraline (ZOLOFT) 25 MG tablet Take 25 mg by mouth daily. 02/23/20   [provider]  spironolactone (ALDACTONE) 50 MG tablet Take 3 tablets (150 mg total) by mouth daily. 04/06/20   Fritzi Mandes, MD  triamcinolone cream (KENALOG) 0.1 % APPLY TO AFFECTED AREA TWICE A DAY 09/06/19   [provider]  VENTOLIN HFA 108 (90 Base) MCG/ACT inhaler Inhale 2 puffs into the lungs every 6 (six) hours as needed for wheezing or shortness of breath.  07/28/16   [provider]    Allergies Bee venom, Sulfa antibiotics, and Duloxetine  Family History  Problem Relation Age of Onset  . CAD Mother   . CVA Mother   . Heart disease Mother   . Melanoma  Father   . CVA Father   . Liver disease Brother   . Breast cancer Paternal Aunt   . Alcohol abuse Maternal Grandfather     Social History Social History   Tobacco Use  . Smoking status: Former Smoker    Years: 35.00    Types: Cigars    Quit date: 04/02/2010    Years since quitting: 10.2  . Smokeless tobacco: Never Used  . Tobacco comment: Quit 4 years ago  Vaping Use  . Vaping Use: Never used  Substance Use Topics  . Alcohol use: No    Alcohol/week: 2.0 standard drinks    Types: 2 Cans of beer per week    Comment: Used to be a heavy drinker, quit for 5 months now  . Drug use: No    Review of Systems Level 5 caveat: Unable to obtain review of systems due to altered mental status   ____________________________________________   PHYSICAL EXAM:  VITAL SIGNS: ED Triage Vitals [06/12/20 0834]  Enc Vitals Group     BP      Pulse Rate 64      Resp 18     Temp      Temp src      SpO2      Weight      Height      Head Circumference      Peak Flow      Pain Score      Pain Loc      Pain Edu?      Excl. in [place]?     Constitutional: Alert, oriented x2.  Comfortable appearing, in no acute distress. Eyes: Conjunctivae are normal.  EOMI.  PERRLA.  No scleral icterus. Head: Atraumatic. Nose: No congestion/rhinnorhea. Mouth/Throat: Mucous membranes are moist.   Neck: Normal range of motion.  Cardiovascular: Normal rate, regular rhythm. Grossly normal heart sounds.  Good peripheral circulation. Respiratory: Normal respiratory effort.  No retractions. Lungs CTAB. Gastrointestinal: Soft and nontender. No distention.  Genitourinary: No flank tenderness. Musculoskeletal: No lower extremity edema.  Extremities warm and well perfused.  Neurologic:  Normal speech and language.  Motor intact in all extremities. Skin:  Skin is warm and dry. No rash noted. Psychiatric: Calm and cooperative.  ____________________________________________   LABS (all labs ordered are listed, but only abnormal results are displayed)  Labs Reviewed  AMMONIA - Abnormal; Notable for the following components:      Result Value   Ammonia 138 (*)    All other components within normal limits  BASIC METABOLIC PANEL - Abnormal; Notable for the following components:   Sodium 133 (*)    CO2 20 (*)    Glucose, Bld 125 (*)    Creatinine, Ser 1.36 (*)    GFR, Estimated 56 (*)    All other components within normal limits  HEPATIC FUNCTION PANEL - Abnormal; Notable for the following components:   Albumin 2.9 (*)    AST 49 (*)    Total Bilirubin 1.4 (*)    Bilirubin, Direct 0.4 (*)    Indirect Bilirubin 1.0 (*)    All other components within normal limits  LACTIC ACID, PLASMA - Abnormal; Notable for the following components:   Lactic Acid, Venous 2.9 (*)    All other components within normal limits  LACTIC ACID, PLASMA - Abnormal; Notable for the  following components:   Lactic Acid, Venous 3.0 (*)    All other components within normal limits  CBC WITH DIFFERENTIAL/PLATELET - Abnormal; Notable for  the following components:   RBC 3.34 (*)    Hemoglobin 9.9 (*)    HCT 29.1 (*)    RDW 16.6 (*)    Platelets 128 (*)    All other components within normal limits  URINALYSIS, COMPLETE (UACMP) WITH MICROSCOPIC - Abnormal; Notable for the following components:   Color, Urine YELLOW (*)    APPearance CLEAR (*)    All other components within normal limits  PROTIME-INR - Abnormal; Notable for the following components:   Prothrombin Time 17.0 (*)    INR 1.4 (*)    All other components within normal limits  URINE DRUG SCREEN, QUALITATIVE ([facility] ONLY)  TROPONIN I (HIGH SENSITIVITY)   ____________________________________________  EKG  ED ECG REPORT I, Arta Silence, the attending physician, personally viewed and interpreted this ECG.  Date: 06/12/2020 EKG Time: 0837 Rate: 63 Rhythm: normal sinus rhythm QRS Axis: normal Intervals: Borderline QTc ST/T Wave abnormalities: normal Narrative Interpretation: no evidence of acute ischemia  ____________________________________________  RADIOLOGY  CT head: No ICH or other acute abnormality Chest x-ray interpreted by me shows no focal infiltrate or edema  ____________________________________________   PROCEDURES  Procedure(s) performed: No  Procedures  Critical Care performed: No ____________________________________________   INITIAL IMPRESSION / ASSESSMENT AND PLAN / ED COURSE  Pertinent labs & imaging results that were available during my care of the patient were reviewed by me and considered in my medical decision making (see chart for details).  70 year old male with PMH as noted above including cirrhosis and hepatic encephalopathy presents with altered mental status that was noted yesterday by family.  I reviewed the past medical records in Epic; the patient was  most recently admitted in March of this year with hepatic encephalopathy and GI bleed after presenting with weakness, confusion, and blood-tinged vomiting.  He was admitted earlier the same month for NSTEMI and decompensated cirrhosis.  On exam currently, the patient is alert but oriented x2.  He is comfortable appearing.  Vital signs are normal.  Exam is otherwise unremarkable; mucous membranes are moist, neurologic exam is nonfocal, there is no visible trauma, there is no peripheral edema, and abdomen is soft and nontender.  Differential includes hepatic encephalopathy, dehydration, other metabolic derangement, AKI/uremia, infection/sepsis or less likely cardiac or primary CNS cause.  There is no evidence of active GI bleed at this time.  We will obtain lab work-up, CT head, and reassess.  ----------------------------------------- 11:06 AM on 06/12/2020 -----------------------------------------  Lab work-up reveals significant elevated ammonia consistent with hepatic encephalopathy.  The lactate is also elevated.  This was obtained because the patient initially had hypotension with EMS, however his vital signs are now normal and he has no clinical evidence of acute infection/sepsis.  We will recheck the lactate after fluids.  CT head is negative.  I consulted Dr. Francine Graven from the hospitalist service for admission. ____________________________________________   FINAL CLINICAL IMPRESSION(S) / ED DIAGNOSES  Final diagnoses:  Hepatic encephalopathy ([place])      NEW MEDICATIONS STARTED DURING THIS VISIT:  New Prescriptions   No medications on file     Note:  This document was prepared using Dragon voice recognition software and may include unintentional dictation errors.   Arta Silence, MD 06/12/20 1540

## 2020-06-13 DIAGNOSIS — K7290 Hepatic failure, unspecified without coma: Secondary | ICD-10-CM | POA: Diagnosis not present

## 2020-06-13 LAB — CBC
HCT: 25.9 % — ABNORMAL LOW (ref 39.0–52.0)
Hemoglobin: 8.9 g/dL — ABNORMAL LOW (ref 13.0–17.0)
MCH: 29.8 pg (ref 26.0–34.0)
MCHC: 34.4 g/dL (ref 30.0–36.0)
MCV: 86.6 fL (ref 80.0–100.0)
Platelets: 110 10*3/uL — ABNORMAL LOW (ref 150–400)
RBC: 2.99 MIL/uL — ABNORMAL LOW (ref 4.22–5.81)
RDW: 16.6 % — ABNORMAL HIGH (ref 11.5–15.5)
WBC: 5.9 10*3/uL (ref 4.0–10.5)
nRBC: 0 % (ref 0.0–0.2)

## 2020-06-13 LAB — BASIC METABOLIC PANEL
Anion gap: 6 (ref 5–15)
BUN: 21 mg/dL (ref 8–23)
CO2: 20 mmol/L — ABNORMAL LOW (ref 22–32)
Calcium: 8.5 mg/dL — ABNORMAL LOW (ref 8.9–10.3)
Chloride: 109 mmol/L (ref 98–111)
Creatinine, Ser: 1.27 mg/dL — ABNORMAL HIGH (ref 0.61–1.24)
GFR, Estimated: >60 mL/min (ref >60)
Glucose, Bld: 105 mg/dL — ABNORMAL HIGH (ref 70–99)
Potassium: 3.9 mmol/L (ref 3.5–5.1)
Sodium: 135 mmol/L (ref 135–145)

## 2020-06-13 LAB — AMMONIA: Ammonia: 39 umol/L — ABNORMAL HIGH (ref 9–35)

## 2020-06-13 MED ORDER — CAMPHOR-MENTHOL 0.5-0.5 % EX LOTN
TOPICAL_LOTION | Freq: Two times a day (BID) | CUTANEOUS | Status: DC | PRN
  Filled 2020-06-13: qty 222

## 2020-06-13 NOTE — Progress Notes (Signed)
     Referral received for [facility] :goals of care discussion. Chart reviewed and updates received from RN. Patient assessed.    I was able to speak with patient's sister/POA, Loyola Mast. I introduced myself and the role of Palliative during Mr. Widmann's hospitalization. She verbalized understanding and sharing patient lives in the home with her and she is his primary caregiver in addition to patient's aunt, Ulis Rias. Patient does have a son, Quita Skye who is involved and a daughter. All family who are involved in decision making would like to be present during discussions. [place] meeting scheduled for 06/14/2020 @ 12pm. Family is aware we will meet at patient's bedside.   Thank you for your referral and allowing PMT to assist in Mr. Leiland A Dresner's care.   Alda Lea, AGPCNP-BC Palliative Medicine Team  Phone: [phone] Pager: [phone] Amion: N. [place]   NO CHARGE

## 2020-06-13 NOTE — TOC Benefit Eligibility Note (Signed)
Transition of Care [facility]) Benefit Eligibility Note    Patient Details  Name: Jeff Ward MRN: [id number] Date of Birth: 11-Nov-1950   Medication/Dose: Rifaximin 550 mg BID - He said Generic and Brand name not on formulary, Said to ask MD to prescribe a different medication  Covered?:  (Not on Formulary)        Spoke with Person/Company/Phone Number:: [place], [employer], [phone] (transferred call to [facility] Part D plan - no number available)                [place] Phone Number: 06/13/2020, 1:25 PM

## 2020-06-13 NOTE — Progress Notes (Signed)
[facility]) Hospital Liaison RN note:  Received new referral for [facility] out patient palliative program to follow post discharge from [place], TOC. Patient information given to referral. Plan is to discharge home once medically ready, [facility] Liaison will follow for disposition.  Thank you for this referral.  Zandra Abts, RN [facility] Liaison [phone]

## 2020-06-13 NOTE — Progress Notes (Signed)
PROGRESS NOTE    Jeff Ward  OMB:[id number] DOB: 02/06/1950 DOA: 06/12/2020 PCP: Adin Hector, MD   Brief Narrative: 70 year old with past medical history significant for alcoholic liver cirrhosis, GERD, dyslipidemia, depression who presented to the ER via EMS for further evaluation of altered mental status that is started 1 day prior to admission.  Per family patient has been more lethargic and confused.  He might have missed, refused a dose of lactulose over the weekend.  Evaluation in the ED alkaline phosphatase 90, AST 49, ALT 25, ammonia 138, INR 1.4 chest x-ray no acute pulmonary disease.  CT head without contrast prior tiny lacunar infarct no acute infarct evident.   Assessment & Plan:   Principal Problem:   Hepatic encephalopathy (HCC) Active Problems:   Essential hypertension, benign   Depression   GERD (gastroesophageal reflux disease)   Alcoholic cirrhosis (HCC)   Dehydration   Pneumonia due to COVID-19 virus  1-Acute Hepatic Encephalopathy:  -Patient does  not appear to have an acute infection, no fever or leukocytosis.  -No worsening ascites.  -he missed a dose of lactulose. Explain to family he needs to have 3 -4 BM per day. Lactulose can be adjusted for that.  -He could not afford Rixamin, last admission. CM consulted to check for benefit.  -LFT stable, INR stable.  -Melds score 15 points, 6% 3 month mortality.  -palliative consulted for goals of care.  -Send Message to Dr Marius Ditch.  -IV ceftriaxone.  -Ammonia Down to 39  2-Depression: Continue with buspirone and sertraline  Dehydration: Continue with IV fluids Mild  increase creatinine  GERD: Continue with PPI Lactic acidosis: Likely related to liver disease  Estimated body mass index is 22.42 kg/m as calculated from the following:   Height as of this encounter: 6' (1.829 m).   Weight as of this encounter: 75 kg.   DVT prophylaxis: Lovenox Code Status: Full code Family Communication: care  discussed with caregiver, Aunt.  Disposition Plan:  Status is: Inpatient  Remains inpatient appropriate because:IV treatments appropriate due to intensity of illness or inability to take PO   Dispo: The patient is from: Home              Anticipated d/c is to: Home              Patient currently is not medically stable to d/c.   Difficult to place patient No        Consultants:   Palliative care.   Procedures:   None  Antimicrobials:    Subjective: He is alert, oriented to place and person, he has improved but not at baseline, still confuse per family.  He might have missed one dose of lactulose prior getting confuse. He was more sleepy recently.   He has an appointment with dr Marius Ditch with GI in June for further evaluation. Family would like to see if the appointment can be sooner or see Dr Marius Ditch.   Family would like to speak with palliative for goals of care and prognosis.   Objective: Vitals:   06/12/20 2328 06/13/20 0331 06/13/20 0816 06/13/20 1205  BP: (!) 111/55 114/61 (!) 142/77 120/66  Pulse: 77 72 72 65  Resp: 16 16 18 18   Temp: 98.4 F (36.9 C) 99.3 F (37.4 C) 98.2 F (36.8 C) 98.5 F (36.9 C)  TempSrc:  Oral  Oral  SpO2: 99% 100% 100% 100%  Weight:      Height:  Intake/Output Summary (Last 24 hours) at 06/13/2020 1519 Last data filed at 06/13/2020 1352 Gross per 24 hour  Intake 240 ml  Output --  Net 240 ml   Filed Weights   06/12/20 0835  Weight: 75 kg    Examination:  General exam: Appears calm and comfortable  Respiratory system: Clear to auscultation. Respiratory effort normal. Cardiovascular system: S1 & S2 heard, RRR. No JVD, murmurs, rubs, gallops or clicks. No pedal edema. Gastrointestinal system: Abdomen is nondistended, soft and nontender. No organomegaly or masses felt. Normal bowel sounds heard. Central nervous system: Alert and oriented.  Extremities: Symmetric 5 x 5 power.    Data Reviewed: I have personally  reviewed following labs and imaging studies  CBC: Recent Labs  Lab 06/12/20 0834 06/13/20 0416  WBC 6.2 5.9  NEUTROABS 3.5  --   HGB 9.9* 8.9*  HCT 29.1* 25.9*  MCV 87.1 86.6  PLT 128* 616*   Basic Metabolic Panel: Recent Labs  Lab 06/12/20 0834 06/13/20 0416  NA 133* 135  K 4.1 3.9  CL 104 109  CO2 20* 20*  GLUCOSE 125* 105*  BUN 21 21  CREATININE 1.36* 1.27*  CALCIUM 9.0 8.5*   GFR: Estimated Creatinine Clearance: 57.4 mL/min (A) (by C-G formula based on SCr of 1.27 mg/dL (H)). Liver Function Tests: Recent Labs  Lab 06/12/20 0834  AST 49*  ALT 25  ALKPHOS 90  BILITOT 1.4*  PROT 6.7  ALBUMIN 2.9*   No results for input(s): LIPASE, AMYLASE in the last 168 hours. Recent Labs  Lab 06/12/20 0834 06/13/20 0828  AMMONIA 138* 39*   Coagulation Profile: Recent Labs  Lab 06/12/20 0920  INR 1.4*   Cardiac Enzymes: No results for input(s): CKTOTAL, CKMB, CKMBINDEX, TROPONINI in the last 168 hours. BNP (last 3 results) No results for input(s): PROBNP in the last 8760 hours. HbA1C: No results for input(s): HGBA1C in the last 72 hours. CBG: No results for input(s): GLUCAP in the last 168 hours. Lipid Profile: No results for input(s): CHOL, HDL, LDLCALC, TRIG, CHOLHDL, LDLDIRECT in the last 72 hours. Thyroid Function Tests: No results for input(s): TSH, T4TOTAL, FREET4, T3FREE, THYROIDAB in the last 72 hours. Anemia Panel: No results for input(s): VITAMINB12, FOLATE, FERRITIN, TIBC, IRON, RETICCTPCT in the last 72 hours. Sepsis Labs: Recent Labs  Lab 06/12/20 0835 06/12/20 1139  LATICACIDVEN 2.9* 3.0*    No results found for this or any previous visit (from the past 240 hour(s)).       Radiology Studies: CT Head Wo Contrast  Result Date: 06/12/2020 CLINICAL DATA:  Altered mental status EXAM: CT HEAD WITHOUT CONTRAST TECHNIQUE: Contiguous axial images were obtained from the base of the skull through the vertex without intravenous contrast.  COMPARISON:  July 26, 2015 head CT; brain MRI August 10, 2015 FINDINGS: Brain: There is stable age related volume loss. There is no intracranial mass, hemorrhage, extra-axial fluid collection, or midline shift. Evidence of prior tiny lacunar infarct in head of the caudate nucleus on the right. There is slight decreased attenuation in portions of the centra semiovale. No acute infarct is evident. Vascular: No hyperdense vessel. There is calcification in each carotid siphon region. Skull: Bony calvarium appears intact. Sinuses/Orbits: There is opacification in in an anterior right ethmoid air cell. There is mild mucosal thickening in several ethmoid air cells. Orbits appear symmetric bilaterally. Other: Visualized mastoid air cells are clear. IMPRESSION: Slight periventricular small vessel disease. Prior tiny lacunar infarct in the head of the caudate nucleus  on the right. No mass or hemorrhage. No acute infarct evident. There are foci of arterial vascular calcification. Foci of ethmoid paranasal sinus disease noted. Electronically Signed   By: Lowella Grip III M.D.   On: 06/12/2020 09:42   DG Chest Portable 1 View  Result Date: 06/12/2020 CLINICAL DATA:  Weakness, altered mental status EXAM: PORTABLE CHEST 1 VIEW COMPARISON:  04/13/2020 FINDINGS: Heart is normal size. Aortic atherosclerosis. No confluent opacities or effusions. No acute bony abnormality. IMPRESSION: No active disease. Electronically Signed   By: Rolm Baptise M.D.   On: 06/12/2020 08:57        Scheduled Meds: . aspirin EC  81 mg Oral Daily  . atorvastatin  40 mg Oral Daily  . busPIRone  15 mg Oral BID  . enoxaparin (LOVENOX) injection  40 mg Subcutaneous Q24H  . furosemide  60 mg Oral Daily  . lactulose  20 g Oral TID  . pantoprazole  40 mg Oral Daily  . propranolol  10 mg Oral BID  . rifaximin  550 mg Oral BID  . sertraline  25 mg Oral Daily  . sodium chloride flush  3 mL Intravenous Q12H  . spironolactone  150 mg Oral Daily    Continuous Infusions: . sodium chloride    . cefTRIAXone (ROCEPHIN)  IV 2 g (06/13/20 1143)     LOS: 1 day    Time spent: 35 minutes     Kollyns Mickelson A Raguel Kosloski, MD [facility]   If 7PM-7AM, please contact night-coverage [email or web address]  06/13/2020, 3:19 PM

## 2020-06-13 NOTE — TOC Initial Note (Signed)
Transition of Care [facility]) - Initial/Assessment Note    Patient Details  Name: Jeff Ward MRN: [id number] Date of Birth: 24-Jul-1950  Transition of Care [facility]) CM/SW Contact:    Shelbie Hutching, RN Phone Number: 06/13/2020, 11:18 AM  Clinical Narrative:                 Patient admitted to the hospital with hepatic encephalopathy, history of alcoholic liver cirrhosis.  RNCM met with patient and patient's Jeff Ward at the beside.  Jeff Ward and patient's sister, Jeff Ward, are the primary caregivers for the patient and provide 24/7 care and supervision.  Patient lives with his sister, he is able to do basic ADL's.  Sister and aunt provide meals and transportation.  Patient is current with his PCP Dr. Caryl Comes.   Family reports to [facility] that they are ready for palliative care in the home.  Palliative referral was made last admission to [facility] with [facility] but once patient and family got home they decided not to accept at that time.  Family is not ready for palliative and then later transition to hospice.  Palliative referral given to [facility] with [facility] and Palliative.   TOC will cont to follow.   Expected Discharge Plan: Home/Self Care Barriers to Discharge: Continued Medical Work up   Patient Goals and CMS Choice Patient states their goals for this hospitalization and ongoing recovery are:: Family is ready for Outpatient palliative services and wants to have a honest direct conversation with the provider about prognosis CMS Medicare.gov Compare Post Acute Care list provided to:: Patient Represenative (must comment) Choice offered to / list presented to : Adult Children  Expected Discharge Plan and Services Expected Discharge Plan: Home/Self Care   Discharge Planning Services: CM Consult   Living arrangements for the past 2 months: Single Family Home                 DME Arranged: N/A DME Agency: NA       HH Arranged: NA          Prior Living Arrangements/Services Living  arrangements for the past 2 months: Single Family Home Lives with:: Siblings Patient language and need for interpreter reviewed:: Yes Do you feel safe going back to the place where you live?: Yes      Need for Family Participation in Patient Care: Yes (Comment) (liver cirrhosis) Care giver support system in place?: Yes (comment)   Criminal Activity/Legal Involvement Pertinent to Current Situation/Hospitalization: No - Comment as needed  Activities of Daily Living Home Assistive Devices/Equipment: Cane (specify quad or straight),Walker (specify type) ADL Screening (condition at time of admission) Patient's cognitive ability adequate to safely complete daily activities?: Yes Is the patient deaf or have difficulty hearing?: No Does the patient have difficulty seeing, even when wearing glasses/contacts?: No Does the patient have difficulty concentrating, remembering, or making decisions?: Yes Patient able to express need for assistance with ADLs?: Yes Does the patient have difficulty dressing or bathing?: No Independently performs ADLs?: Yes (appropriate for developmental age) Does the patient have difficulty walking or climbing stairs?: No Weakness of Legs: Both Weakness of Arms/Hands: None  Permission Sought/Granted Permission sought to share information with : Case Manager,Family Chief Financial Officer Permission granted to share information with : Yes, Verbal Permission Granted  Share Information with NAME: Jeff Ward and Jeff Ward  Permission granted to share info w AGENCY: [facility] and Palliative Care  Permission granted to share info w Relationship: aunt and sister  Emotional Assessment   Attitude/Demeanor/Rapport: Engaged Affect (typically observed): Accepting Orientation: : Oriented to Self,Oriented to Place,Oriented to  Time,Oriented to Situation Alcohol / Substance Use: Not Applicable Psych Involvement: No (comment)  Admission diagnosis:  Hepatic  encephalopathy ([place]) [K72.90] Pneumonia due to COVID-19 virus [U07.1, J12.82] Patient Active Problem List   Diagnosis Date Noted  . Dehydration 06/12/2020  . Pneumonia due to COVID-19 virus 06/12/2020  . Hepatic encephalopathy ([place]) 04/13/2020  . Encephalopathy acute 04/13/2020  . Portal hypertension ([place]) 04/13/2020  . Physical deconditioning 04/13/2020  . NSTEMI (non-ST elevated myocardial infarction) ([place]) 04/01/2020  . Hypokalemia 02/29/2020  . Hypotension 02/29/2020  . Prolonged QT interval 02/29/2020  . Symptomatic anemia 02/29/2020  . Occult blood in stools 02/29/2020  . Thrombocytopenia ([place]) 02/29/2020  . SI (sacroiliac) joint inflammation ([place]) 03/31/2016  . Encephalopathy, hepatic ([place]) 03/05/2016  . Esophageal varices without bleeding ([place]) 03/05/2016  . Right foot pain 01/31/2016  . Hyperkalemia 01/31/2016  . Alcoholic cirrhosis of liver with ascites ([place]) 08/06/2015  . Secondary esophageal varices with bleeding ([place])   . Alcoholic cirrhosis ([place]) 47/42/5956  . Liver cirrhosis ([place]) 06/19/2015  . GERD (gastroesophageal reflux disease) 03/22/2015  . Personal history of colonic polyps 04/27/2013  . Depression 02/17/2013  . Hyperglycemia 07/23/2011  . OSA (obstructive sleep apnea) 04/10/2011  . Testosterone deficiency 03/06/2011  . Rectal bleeding 01/09/2011  . Degenerative disc disease, lumbar 05/09/2010  . OTHER NONSPECIFIC ABNORMAL SERUM ENZYME LEVELS 05/23/2009  . Essential hypertension, benign 09/01/2007  . COLONIC POLYPS 10/23/2006  . HLD (hyperlipidemia) 10/23/2006  . PUD 10/23/2006  . 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 CARPAL TUNNEL SYNDROME, BILATERAL, HX OF 10/23/2006  . BENIGN PROSTATIC HYPERTROPHY, HX OF 10/23/2006   PCP:  Adin Hector, MD Pharmacy:   [id number] [id number]: [phone]: [phone]    Social Determinants of Health (SDOH) Interventions     Readmission Risk Interventions Readmission Risk Prevention Plan 06/13/2020  Transportation Screening Complete  PCP or Specialist Appt within 3-5 Days Complete  HRI or [place]  Social Work Consult for [place]anning/Counseling Complete  Palliative Care Screening Complete  Medication Review (Press photographer Complete  Some recent data might be hidden

## 2020-06-14 DIAGNOSIS — K7290 Hepatic failure, unspecified without coma: Secondary | ICD-10-CM | POA: Diagnosis not present

## 2020-06-14 DIAGNOSIS — U071 COVID-19: Secondary | ICD-10-CM

## 2020-06-14 DIAGNOSIS — K7030 Alcoholic cirrhosis of liver without ascites: Secondary | ICD-10-CM | POA: Diagnosis not present

## 2020-06-14 DIAGNOSIS — J1282 Pneumonia due to coronavirus disease 2019: Secondary | ICD-10-CM

## 2020-06-14 DIAGNOSIS — I10 Essential (primary) hypertension: Secondary | ICD-10-CM | POA: Diagnosis not present

## 2020-06-14 DIAGNOSIS — Z7189 Other specified counseling: Secondary | ICD-10-CM

## 2020-06-14 DIAGNOSIS — Z66 Do not resuscitate: Secondary | ICD-10-CM

## 2020-06-14 DIAGNOSIS — Z515 Encounter for palliative care: Secondary | ICD-10-CM

## 2020-06-14 LAB — HEPATIC FUNCTION PANEL
ALT: 27 U/L (ref 0–44)
AST: 63 U/L — ABNORMAL HIGH (ref 15–41)
Albumin: 2.6 g/dL — ABNORMAL LOW (ref 3.5–5.0)
Alkaline Phosphatase: 76 U/L (ref 38–126)
Bilirubin, Direct: 0.4 mg/dL — ABNORMAL HIGH (ref 0.0–0.2)
Indirect Bilirubin: 1.1 mg/dL — ABNORMAL HIGH (ref 0.3–0.9)
Total Bilirubin: 1.5 mg/dL — ABNORMAL HIGH (ref 0.3–1.2)
Total Protein: 6.2 g/dL — ABNORMAL LOW (ref 6.5–8.1)

## 2020-06-14 LAB — BASIC METABOLIC PANEL
Anion gap: 7 (ref 5–15)
BUN: 15 mg/dL (ref 8–23)
CO2: 20 mmol/L — ABNORMAL LOW (ref 22–32)
Calcium: 8.4 mg/dL — ABNORMAL LOW (ref 8.9–10.3)
Chloride: 107 mmol/L (ref 98–111)
Creatinine, Ser: 1 mg/dL (ref 0.61–1.24)
GFR, Estimated: >60 mL/min (ref >60)
Glucose, Bld: 90 mg/dL (ref 70–99)
Potassium: 3.9 mmol/L (ref 3.5–5.1)
Sodium: 134 mmol/L — ABNORMAL LOW (ref 135–145)

## 2020-06-14 MED ORDER — BUSPIRONE HCL 15 MG PO TABS
7.5000 mg | ORAL_TABLET | Freq: Two times a day (BID) | ORAL | Status: DC
  Administered 2020-06-14 – 2020-06-15 (×2): 7.5 mg via ORAL
  Filled 2020-06-14 (×3): qty 1

## 2020-06-14 MED ORDER — LACTULOSE 10 GM/15ML PO SOLN
20.0000 g | Freq: Two times a day (BID) | ORAL | Status: DC
  Administered 2020-06-14 – 2020-06-15 (×2): 20 g via ORAL
  Filled 2020-06-14 (×2): qty 30

## 2020-06-14 NOTE — Consult Note (Signed)
Consultation Note Date: 06/14/2020   Patient Name: Jeff Ward  DOB: 06-24-1950  MRN: [id number]  Age / Sex: 70 y.o., male   PCP: Jeff Hector, MD Referring Physician: Elmarie Shiley, MD   REASON FOR CONSULTATION:Establishing goals of care  Palliative Care consult requested for goals of care discussion in this 70 y.o. male with a medical history significant for alcoholic liver cirrhosis (last drink 5 mos ago), GERD, dyslipidemia, depression, asthma, anxiety, CKD, esophageal varices, hiatal hernia, hypertension, hyperlipidemia, and CVA. Patient presented to the ED from with complaints of altered mental status x 1 day. Ammonia 138. Chest x-ray showed no acute abnormalities. CT of head showed slight periventricular small vessel disease.  Clinical Assessment and Goals of Care: I have reviewed medical records including lab results, imaging, Epic notes, and MAR, received report from the bedside RN, and assessed the patient.   I met at the bedside with patient, his son, and sister to discuss diagnosis prognosis, GOC, EOL wishes, disposition and options.  [name] is awake, alert and oriented x3. He is sitting on the side of the bed eating lunch. Denies pain or shortness of breath.   Patient is familiar to our team. He was seen by my colleague in May when admitted. Family and patient verbalized their remembrance of discussions. I re-introduced Palliative Medicine as specialized medical care for people living with serious illness. It focuses on providing relief from the symptoms and stress of a serious illness. The goal is to improve quality of life for both the patient and the family.  We discussed a brief life review of the patient, along with his functional and nutritional status. He currently lives in the home with his sister, Jeff Ward. He has 2 children.   Prior to admission he reports being independent of all ADLs. Reports at times require breaks due to increased fatigue or  shortness of breath. His appetite has been good. He is ambulatory without assistance.   We discussed His current illness and what it means in the larger context of His on-going co-morbidities. We discussed at length patient's cirrhosis and recurrent encephalopathy. Natural disease trajectory and expectations at EOL were discussed.  Patient and family verbalized their understanding of patient's condition. We discussed at length MELD score of 15 and what this means as a prognostication tool. Son verbalized understanding sharing previous discussions and understanding of score.   A detailed discussion was had today regarding advanced directives.  Concepts specific to code status, artifical feeding and hydration, continued IV antibiotics and rehospitalization. Patient states he does not have an advanced directive. Education provided on an advanced directive. [name] states he would want his son, Jeff Ward and his sister, Jeff Ward. Document was provided and patient has requested to complete.   I discussed at length his full code status with consideration to his current illness and co-morbidities. Patient and family all mutually expressed wishes for DNR/DNI with awareness of his poor prognosis.   The difference between a aggressive medical intervention and a palliative comfort care path were discussed at length. Values and goals of care important to patient and family were attempted to be elicited.   Patient and family are clear in expressed goals to continue to treat the treatable. They remain hopeful for some stability and decreased need for re-hospitalizations. Patient is arranged to see Dr. Marius Ward outpatient and they plan to closely work with team to allow patient to continue to thrive as much as possible during what  time he has left. His goal is to return home with family support.    I discussed the importance of continued conversation with family and their medical providers regarding overall plan  of care and treatment options, ensuring decisions are within the context of the patients values and GOCs.  Hospice and Palliative Care services outpatient were explained and offered. Patient and family verbalized their understanding and awareness of both palliative and hospice's goals and philosophy of care. Patient and family are requesting outpatient palliative with awareness they may transition services to hospice when they feel is most appropriate.   Questions and concerns were addressed.  The family was encouraged to call with questions or concerns.  PMT will continue to support holistically as needed.   CODE STATUS: DNR  ADVANCE DIRECTIVES: Primary Decision Maker: Patient     SYMPTOM MANAGEMENT:per attending   Palliative Prophylaxis:   Bowel Regimen and Delirium Protocol  PSYCHO-SOCIAL/SPIRITUAL:  Support System: Family  Desire for further Chaplaincy support:Yes  Additional Recommendations (Limitations, Scope, Preferences):  No Artificial Feeding and Continue to treat the treatable, DNR/DNI   Education on hospice/palliative    PAST MEDICAL HISTORY: Past Medical History:  Diagnosis Date  . Allergy    SEASONAL  . Anemia   . Anxiety   . Arthritis    In back and all joints  . Asthma   . Chronic headache    now resolved  . CKD (chronic kidney disease)   . 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 Depression   . Esophageal varices (HCC)   . GERD (gastroesophageal reflux disease)   . GI bleed   . Hiatal hernia   . History of kidney stones 1970's  . Hyperlipidemia 06/2002  . Hypertension   . Liver disease   . Psoriasis   . Stroke (HCC)    PT. STATED MILD  . Substance abuse (HCC)    H/O ETOH    ALLERGIES:  is allergic to bee venom, sulfa antibiotics, and duloxetine.   MEDICATIONS:  Current Facility-Administered Medications  Medication Dose Route Frequency Provider Last Rate Last Admin  . 0.9 %  sodium chloride infusion  250 mL Intravenous  PRN Ward, Tochukwu, MD      . albuterol (PROVENTIL) (2.5 MG/3ML) 0.083% nebulizer solution 2.5 mg  2.5 mg Inhalation Q6H PRN Ward, Tochukwu, MD      . aspirin EC tablet 81 mg  81 mg Oral Daily Ward, Tochukwu, MD   81 mg at 06/14/20 1059  . atorvastatin (LIPITOR) tablet 40 mg  40 mg Oral Daily Ward, Tochukwu, MD   40 mg at 06/13/20 2118  . busPIRone (BUSPAR) tablet 15 mg  15 mg Oral BID Ward, Tochukwu, MD   15 mg at 06/14/20 1100  . camphor-menthol (SARNA) lotion   Topical BID PRN Regalado, Belkys A, MD      . cefTRIAXone (ROCEPHIN) 2 g in sodium chloride 0.9 % 100 mL IVPB  2 g Intravenous Q24H Ward, Tochukwu, MD   Stopping Infusion hung by another clincian at 06/13/20 2005  . enoxaparin (LOVENOX) injection 40 mg  40 mg Subcutaneous Q24H Ward, Tochukwu, MD   40 mg at 06/13/20 1143  . furosemide (LASIX) tablet 60 mg  60 mg Oral Daily Ward, Tochukwu, MD   60 mg at 06/14/20 1059  . lactulose (CHRONULAC) 10 GM/15ML solution 20 g  20 g Oral TID Ward, Tochukwu, MD   20 g at 06/14/20 1058  . pantoprazole (PROTONIX) EC tablet 40 mg    40 mg Oral Daily Ward, Tochukwu, MD   40 mg at 06/14/20 1059  . propranolol (INDERAL) tablet 10 mg  10 mg Oral BID Ward, Tochukwu, MD   10 mg at 06/14/20 1100  . rifaximin (XIFAXAN) tablet 550 mg  550 mg Oral BID Ward, Tochukwu, MD   550 mg at 06/14/20 1059  . sertraline (ZOLOFT) tablet 25 mg  25 mg Oral Daily Ward, Tochukwu, MD   25 mg at 06/14/20 1059  . sodium chloride flush (NS) 0.9 % injection 3 mL  3 mL Intravenous Q12H Ward, Tochukwu, MD   3 mL at 06/14/20 1100  . sodium chloride flush (NS) 0.9 % injection 3 mL  3 mL Intravenous PRN Ward, Tochukwu, MD      . spironolactone (ALDACTONE) tablet 150 mg  150 mg Oral Daily Ward, Tochukwu, MD   150 mg at 06/14/20 1059    VITAL SIGNS: BP 130/66 (BP Location: Left Arm)   Pulse 61   Temp 98.8 F (37.1 C) (Oral)   Resp 18   Ht 6' (1.829 m)   Wt 75 kg   SpO2 100%   BMI 22.42 kg/m  Filed  Weights   06/12/20 0835  Weight: 75 kg    Estimated body mass index is 22.42 kg/m as calculated from the following:   Height as of this encounter: 6' (1.829 m).   Weight as of this encounter: 75 kg.  LABS: CBC:    Component Value Date/Time   WBC 5.9 06/13/2020 0416   HGB 8.9 (L) 06/13/2020 0416   HCT 25.9 (L) 06/13/2020 0416   PLT 110 (L) 06/13/2020 0416   Comprehensive Metabolic Panel:    Component Value Date/Time   NA 134 (L) 06/14/2020 0439   NA 140 12/20/2013 0907   K 3.9 06/14/2020 0439   BUN 15 06/14/2020 0439   BUN 19 12/20/2013 0907   CREATININE 1.00 06/14/2020 0439   ALBUMIN 2.6 (L) 06/14/2020 0439   ALBUMIN 4.3 12/20/2013 0907     Review of Systems  Neurological: Positive for weakness.  Unless otherwise noted, a complete review of systems is negative.  Physical Exam General: NAD,  chronically-ill appearing Cardiovascular: regular rate and rhythm Pulmonary:  diminished bilaterally  Abdomen: soft, nontender, + bowel sounds Extremities: no edema, no joint deformities Skin: no rashes, warm and dry Neurological: AAO x3, mood appropriate   Prognosis: Guarded- Poor in the setting of alcoholic cirrhosis, encephalopathy, elevated ammonia, history of esophageal varices. MELD score 15  Discharge Planning:  Home with Palliative Services  Recommendations: . DNR/DNI-as confirmed by patient and family. Education provided on advanced directive and MOST. Patient requesting to complete AD stating he would like his son, Jeff Ward and sister, Jeff Ward to be his Air traffic controller. (Spiritual consult placed) . Continue with current plan of care, treat the treatable. Patient and family remains hopeful for some improvement. They are realistic in the understanding of disease trajectory. Goal is to return home with family and outpatient palliative support. (TOC referral placed)   . PMT will continue to support and follow as needed. Please call team line with urgent needs. I will be  off service until Monday 5/23.    Palliative Performance Scale: PPS 30-40%              Patient and family expressed understanding and was in agreement with this plan.   Thank you for allowing the Palliative Medicine Team to assist in the care of this patient. Please utilize secure chat with additional  questions, if there is no response within 30 minutes please call the above phone number.   Time In: 1205 Time Out: 1315 Time Total: 70 min.   Visit consisted of counseling and education dealing with the complex and emotionally intense issues of symptom management and palliative care in the setting of serious and potentially life-threatening illness.Greater than 50%  of this time was spent counseling and coordinating care related to the above assessment and plan.  Signed by:  Nikki Pickenpack-Cousar, AGPCNP-BC Palliative Medicine Team  Phone: [phone] Pager: [phone] Amion: N. Cousar   Palliative Medicine Team providers are available by phone from 7am to 7pm daily and can be reached through the team cell phone.  Should this patient require assistance outside of these hours, please call the patient's attending physician. 

## 2020-06-14 NOTE — Progress Notes (Signed)
PROGRESS NOTE    Jeff Ward  KGY:[id number] DOB: 1950-07-08 DOA: 06/12/2020 PCP: Adin Hector, MD   Brief Narrative: 70 year old with past medical history significant for alcoholic liver cirrhosis, GERD, dyslipidemia, depression who presented to the ER via EMS for further evaluation of altered mental status that is started 1 day prior to admission.  Per family patient has been more lethargic and confused.  He might have missed, refused a dose of lactulose over the weekend.  Evaluation in the ED alkaline phosphatase 90, AST 49, ALT 25, ammonia 138, INR 1.4 chest x-ray no acute pulmonary disease.  CT head without contrast prior tiny lacunar infarct no acute infarct evident.   Assessment & Plan:   Principal Problem:   Hepatic encephalopathy (HCC) Active Problems:   Essential hypertension, benign   Depression   GERD (gastroesophageal reflux disease)   Alcoholic cirrhosis (HCC)   Dehydration   Pneumonia due to COVID-19 virus  1-Acute Hepatic Encephalopathy:  -Patient does  not appear to have an acute infection, no fever or leukocytosis.  -No worsening ascites.  -he missed a dose of lactulose. Explain to family he needs to have 3 -4 BM per day. Lactulose can be adjusted for that.  -LFT stable, INR stable. LFT mildly elevated today.  -Melds score 15 points, 6% 3 month mortality.  -palliative consulted for goals of care.  -GI Consulted.  -IV ceftriaxone.  -Ammonia Down to 39 Will change lactulose to BID. Awaiting GI evaluation to determine discharge plans.   2-Depression: Continue with buspirone and sertraline Decreased buspirone due to cirrhosis.   Dehydration:  Mild  increase creatinine Renal function improved.  Stop fluids GERD: Continue with PPI Lactic acidosis: Likely related to liver disease  Estimated body mass index is 22.42 kg/m as calculated from the following:   Height as of this encounter: 6' (1.829 m).   Weight as of this encounter: 75 kg.   DVT  prophylaxis: Lovenox Code Status: Full code Family Communication: care discussed with caregiver, Sister  Disposition Plan:  Status is: Inpatient  Remains inpatient appropriate because:IV treatments appropriate due to intensity of illness or inability to take PO   Dispo: The patient is from: Home              Anticipated d/c is to: Home              Patient currently is not medically stable to d/c.   Difficult to place patient No        Consultants:   Palliative care.   Procedures:   None  Antimicrobials:    Subjective: He is alert, sister at bedside notice improvement in Mental status.  He had 4-6 BM yesterday.   Objective: Vitals:   06/13/20 1943 06/14/20 0419 06/14/20 0754 06/14/20 1133  BP: (!) 128/59 (!) 103/56 (!) 110/59 130/66  Pulse: 62 (!) 57 (!) 57 61  Resp: 16 16 18 18   Temp: 97.9 F (36.6 C) 98 F (36.7 C) 98.7 F (37.1 C) 98.8 F (37.1 C)  TempSrc: Oral Oral Oral Oral  SpO2: 100% 100% 100% 100%  Weight:      Height:        Intake/Output Summary (Last 24 hours) at 06/14/2020 1542 Last data filed at 06/14/2020 1009 Gross per 24 hour  Intake 360 ml  Output --  Net 360 ml   Filed Weights   06/12/20 0835  Weight: 75 kg    Examination:  General exam: NAD Respiratory system: CTA Cardiovascular system:  S 1, S 2 RRR Gastrointestinal system: BS present, soft, nt Central nervous system: alert Extremities: no edema    Data Reviewed: I have personally reviewed following labs and imaging studies  CBC: Recent Labs  Lab 06/12/20 0834 06/13/20 0416  WBC 6.2 5.9  NEUTROABS 3.5  --   HGB 9.9* 8.9*  HCT 29.1* 25.9*  MCV 87.1 86.6  PLT 128* 601*   Basic Metabolic Panel: Recent Labs  Lab 06/12/20 0834 06/13/20 0416 06/14/20 0439  NA 133* 135 134*  K 4.1 3.9 3.9  CL 104 109 107  CO2 20* 20* 20*  GLUCOSE 125* 105* 90  BUN 21 21 15   CREATININE 1.36* 1.27* 1.00  CALCIUM 9.0 8.5* 8.4*   GFR: Estimated Creatinine Clearance: 72.9  mL/min (by C-G formula based on SCr of 1 mg/dL). Liver Function Tests: Recent Labs  Lab 06/12/20 0834 06/14/20 0439  AST 49* 63*  ALT 25 27  ALKPHOS 90 76  BILITOT 1.4* 1.5*  PROT 6.7 6.2*  ALBUMIN 2.9* 2.6*   No results for input(s): LIPASE, AMYLASE in the last 168 hours. Recent Labs  Lab 06/12/20 0834 06/13/20 0828  AMMONIA 138* 39*   Coagulation Profile: Recent Labs  Lab 06/12/20 0920  INR 1.4*   Cardiac Enzymes: No results for input(s): CKTOTAL, CKMB, CKMBINDEX, TROPONINI in the last 168 hours. BNP (last 3 results) No results for input(s): PROBNP in the last 8760 hours. HbA1C: No results for input(s): HGBA1C in the last 72 hours. CBG: No results for input(s): GLUCAP in the last 168 hours. Lipid Profile: No results for input(s): CHOL, HDL, LDLCALC, TRIG, CHOLHDL, LDLDIRECT in the last 72 hours. Thyroid Function Tests: No results for input(s): TSH, T4TOTAL, FREET4, T3FREE, THYROIDAB in the last 72 hours. Anemia Panel: No results for input(s): VITAMINB12, FOLATE, FERRITIN, TIBC, IRON, RETICCTPCT in the last 72 hours. Sepsis Labs: Recent Labs  Lab 06/12/20 0835 06/12/20 1139  LATICACIDVEN 2.9* 3.0*    No results found for this or any previous visit (from the past 240 hour(s)).       Radiology Studies: No results found.      Scheduled Meds: . aspirin EC  81 mg Oral Daily  . atorvastatin  40 mg Oral Daily  . busPIRone  7.5 mg Oral BID  . enoxaparin (LOVENOX) injection  40 mg Subcutaneous Q24H  . furosemide  60 mg Oral Daily  . lactulose  20 g Oral TID  . pantoprazole  40 mg Oral Daily  . propranolol  10 mg Oral BID  . rifaximin  550 mg Oral BID  . sertraline  25 mg Oral Daily  . sodium chloride flush  3 mL Intravenous Q12H  . spironolactone  150 mg Oral Daily   Continuous Infusions: . sodium chloride    . cefTRIAXone (ROCEPHIN)  IV 2 g (06/14/20 1432)     LOS: 2 days    Time spent: 35 minutes     Mylie Mccurley A Kayona Foor, MD [facility]   If 7PM-7AM, please contact night-coverage [email or web address]  06/14/2020, 3:42 PM

## 2020-06-14 NOTE — Evaluation (Signed)
Physical Therapy Evaluation Patient Details Name: Jeff Ward MRN: [id number] DOB: 1950-07-03 Today's Date: 06/14/2020   History of Present Illness  Pt admitted for hepatic encephalopathy with complaints of AMS. HIstory includes alcoholic liver cirrhosis, GERD, and depression.  Clinical Impression  Pt is a pleasant 70 year old male who was admitted for hepatic encephalopathy. Pt performs bed mobility/transfers with independence and ambulation with supervision and no AD. Pt demonstrates deficits with decreased endurance/mobility. Would benefit from skilled PT to address above deficits and promote optimal return to PLOF. Recommend transition to [facility] upon discharge from acute hospitalization.     Follow Up Recommendations Home health PT (he may refuses services)    Equipment Recommendations  None recommended by PT    Recommendations for Other Services       Precautions / Restrictions Precautions Precautions: Fall Restrictions Weight Bearing Restrictions: No      Mobility  Bed Mobility Overal bed mobility: Independent             General bed mobility comments: safe technique    Transfers Overall transfer level: Independent Equipment used: None             General transfer comment: safe technique  Ambulation/Gait Ambulation/Gait assistance: Supervision Gait Distance (Feet): 50 Feet Assistive device: None Gait Pattern/deviations: WFL(Within Functional Limits)     General Gait Details: ambulated around room, however fatigued with exertion. No AD required  Stairs            Wheelchair Mobility    Modified Rankin (Stroke Patients Only)       Balance Overall balance assessment: No apparent balance deficits (not formally assessed)                                           Pertinent Vitals/Pain Pain Assessment: No/denies pain    Home Living Family/patient expects to be discharged to:: Private residence Living Arrangements:  Other relatives (sister) Available Help at Discharge: Family Type of Home: House Home Access: Stairs to enter Entrance Stairs-Rails: Left Entrance Stairs-Number of Steps: 3 Home Layout: One level Home Equipment: Walker - 2 wheels (several walking sticks)      Prior Function Level of Independence: Independent with assistive device(s)         Comments: typically ambulates with a walking stick. Very active, walks 1/2 mile to [employer]. Still performs Museum/gallery conservator        Extremity/Trunk Assessment   Upper Extremity Assessment Upper Extremity Assessment: Overall WFL for tasks assessed    Lower Extremity Assessment Lower Extremity Assessment: Generalized weakness (B LE grossly 4/5)       Communication   Communication: No difficulties  Cognition Arousal/Alertness: Awake/alert (sleepy) Behavior During Therapy: WFL for tasks assessed/performed Overall Cognitive Status: Within Functional Limits for tasks assessed                                        General Comments      Exercises     Assessment/Plan    PT Assessment Patient needs continued PT services  PT Problem List Decreased strength;Decreased mobility       PT Treatment Interventions Gait training;Therapeutic exercise    PT Goals (Current goals can be found in the Care Plan section)  Acute Rehab PT  Goals Patient Stated Goal: to go home PT Goal Formulation: With patient Time For Goal Achievement: 06/28/20 Potential to Achieve Goals: Good    Frequency Min 2X/week   Barriers to discharge        Co-evaluation               AM-PAC PT "6 Clicks" Mobility  Outcome Measure Help needed turning from your back to your side while in a flat bed without using bedrails?: None Help needed moving from lying on your back to sitting on the side of a flat bed without using bedrails?: None Help needed moving to and from a bed to a chair (including a wheelchair)?: None Help  needed standing up from a chair using your arms (e.g., wheelchair or bedside chair)?: None Help needed to walk in hospital room?: A Little Help needed climbing 3-5 steps with a railing? : A Little 6 Click Score: 22    End of Session   Activity Tolerance: Patient tolerated treatment well Patient left: in bed (reports he wishes to sit in chair later for dinner) Nurse Communication: Mobility status PT Visit Diagnosis: Muscle weakness (generalized) (M62.81);Difficulty in walking, not elsewhere classified (R26.2)    Time: 2725-3664 PT Time Calculation (min) (ACUTE ONLY): 20 min   Charges:   PT Evaluation $PT Eval Low Complexity: [place], PT, DPT [phone]   Braelynn Lupton 06/14/2020, 5:02 PM

## 2020-06-14 NOTE — Progress Notes (Signed)
   06/14/20 1345  Clinical Encounter Type  Visited With Patient  Visit Type Initial;Spiritual support;Social support  Referral From Nurse  Consult/Referral To [place] responded to a page from nurse stating the PT wanted to complete an AD. Chaplain Mariella Saa was assisted by [employer]. Chaplains ministered with presence and reflective listening. AD was notarized by a notary and two witnesses.

## 2020-06-14 NOTE — Consult Note (Signed)
Vonda Antigua, MD [address], [place], [place], [place], [address] [address], [place], [place], [place], [address] Phone: [phone]  Fax: [phone]  Consultation  Referring Provider:     Dr. Tyrell Antonio Primary Care Physician:  Adin Hector, MD Reason for Consultation:     Cirrhosis Primary gastroenterologist: Dr. Marius Ditch  Date of Admission:  06/12/2020 Date of Consultation:  06/14/2020         HPI:   Jeff Ward is a 70 y.o. male with a history of alcoholic cirrhosis, admitted with altered mental status 2 days ago.  During my evaluation today patient is alert and oriented x3.  Patient states he is on lactulose at home and was taking it daily but his bowel movements are only occurring every 2 to 3 days.  After his last admission in March 2022 he was supposed to follow-up with Dr. Marius Ditch but it appears he canceled his last appointment on May 28, 2020 and a new appointment is upcoming in June 2022.  Rifaximin is also listed on his medication list but it does not appear that patient has been getting this due to cost issues.  On this admission patient has been receiving 20 g of lactulose twice daily, along with rifaximin and his altered mental status has completely resolved.  He also has history of esophageal varices, status post ligation, history of paracentesis requiring paracentesis.  Last paracentesis was on March 10 with 4.8 L removed.  Prior to this, March 7 with 7 L removed.  To ultrasound since then on March 11 and March 18 have showed minimal ascites insufficient for paracentesis.  No history of SBP.  Past Medical History:  Diagnosis Date  . Allergy    SEASONAL  . Anemia   . Anxiety   . Arthritis    In back and all joints  . Asthma   . Chronic headache    now resolved  . CKD (chronic kidney disease)   . 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 Depression   . Esophageal varices ([place] [facility])   . GERD (gastroesophageal reflux disease)   . GI  bleed   . Hiatal hernia   . History of kidney stones 1970's  . Hyperlipidemia 06/2002  . Hypertension   . Liver disease   . Psoriasis   . Stroke ([place])    PT. STATED MILD  . Substance abuse ([place])    H/O ETOH    Past Surgical History:  Procedure Laterality Date  . CARPAL TUNNEL RELEASE  2005    / right hand  . COLONOSCOPY     march 2016  . ESOPHAGEAL BANDING  09/22/2017   Procedure: ESOPHAGEAL BANDING;  Surgeon: Ladene Artist, MD;  Location: [facility];  Service: Endoscopy;;  . ESOPHAGEAL BANDING  10/11/2018   Procedure: ESOPHAGEAL BANDING;  Surgeon: Ladene Artist, MD;  Location: [facility] ENDOSCOPY;  Service: Endoscopy;;  . ESOPHAGOGASTRODUODENOSCOPY N/A 07/24/2015   Procedure: ESOPHAGOGASTRODUODENOSCOPY (EGD);  Surgeon: Lucilla Lame, MD;  Location: [facility] ENDOSCOPY;  Service: Endoscopy;  Laterality: N/A;  Banding  . ESOPHAGOGASTRODUODENOSCOPY (EGD) WITH PROPOFOL N/A 08/14/2015   Procedure: ESOPHAGOGASTRODUODENOSCOPY (EGD) WITH PROPOFOL;  Surgeon: Ladene Artist, MD;  Location: [facility] ENDOSCOPY;  Service: Endoscopy;  Laterality: N/A;  . ESOPHAGOGASTRODUODENOSCOPY (EGD) WITH PROPOFOL N/A 09/22/2017   Procedure: ESOPHAGOGASTRODUODENOSCOPY (EGD) WITH PROPOFOL;  Surgeon: Ladene Artist, MD;  Location: [facility] ENDOSCOPY;  Service: Endoscopy;  Laterality: N/A;  . ESOPHAGOGASTRODUODENOSCOPY (EGD) WITH PROPOFOL N/A 10/11/2018  Procedure: ESOPHAGOGASTRODUODENOSCOPY (EGD) WITH PROPOFOL;  Surgeon: Ladene Artist, MD;  Location: [facility] ENDOSCOPY;  Service: Endoscopy;  Laterality: N/A;  . LEFT HEART CATH N/A 04/02/2020   Procedure: Left Heart Cath and Coronary Angiography;  Surgeon: Isaias Cowman, MD;  Location: [facility];  Service: Cardiovascular;  Laterality: N/A;  . TONSILLECTOMY AND ADENOIDECTOMY     70 years old  . VASECTOMY      Prior to Admission medications   Medication Sig Start Date End Date Taking? Authorizing Provider  amLODipine (NORVASC) 5 MG tablet Take 5 mg by mouth daily.  03/21/20  Yes [provider]  aspirin EC 81 MG EC tablet Take 1 tablet (81 mg total) by mouth daily. Swallow whole. 04/07/20  Yes Fritzi Mandes, MD  atorvastatin (LIPITOR) 40 MG tablet Take 40 mg by mouth daily. 12/28/19  Yes [provider]  busPIRone (BUSPAR) 7.5 MG tablet Take 2 tablets by mouth in the morning and at bedtime. 12/27/19  Yes [provider]  fluocinolone (SYNALAR) 0.01 % external solution Apply topically 2 (two) times daily.   Yes [provider]  furosemide (LASIX) 20 MG tablet Take 3 tablets (60 mg total) by mouth daily. Patient taking differently: Take 20 mg by mouth in the morning, at noon, and at bedtime. 04/06/20  Yes Fritzi Mandes, MD  ketoconazole (NIZORAL) 2 % cream Apply topically. 09/23/19 09/22/20 Yes [provider]  lactulose (CHRONULAC) 10 GM/15ML solution TAKE 20 MLS (13.3333 G TOTAL) BY MOUTH IN THE MORNING AND AT BEDTIME. 06/05/20  Yes Ladene Artist, MD  pantoprazole (PROTONIX) 40 MG tablet Take 1 tablet (40 mg total) by mouth daily. 03/05/16  Yes Ladene Artist, MD  propranolol (INDERAL) 10 MG tablet Take 10 mg by mouth 2 (two) times daily. 03/04/20  Yes [provider]  sertraline (ZOLOFT) 25 MG tablet Take 25 mg by mouth daily. 02/23/20  Yes [provider]  spironolactone (ALDACTONE) 50 MG tablet Take 3 tablets (150 mg total) by mouth daily. 04/06/20  Yes Fritzi Mandes, MD  triamcinolone cream (KENALOG) 0.1 % APPLY TO AFFECTED AREA TWICE A DAY 09/06/19  Yes [provider]  VENTOLIN HFA 108 (90 Base) MCG/ACT inhaler Inhale 2 puffs into the lungs every 6 (six) hours as needed for wheezing or shortness of breath.  07/28/16  Yes [provider]  EPINEPHrine 0.3 mg/0.3 mL IJ SOAJ injection Inject into the muscle.    [provider]  rifaximin (XIFAXAN) 550 MG TABS tablet Take 1 tablet (550 mg total) by mouth 2 (two) times daily. Patient not taking: No sig reported 04/06/20   Fritzi Mandes, MD     Family History  Problem Relation Age of Onset  . CAD Mother   . CVA Mother   . Heart disease Mother   . Melanoma Father   . CVA Father   . Liver disease Brother   . Breast cancer Paternal Aunt   . Alcohol abuse Maternal Grandfather      Social History   Tobacco Use  . Smoking status: Former Smoker    Years: 35.00    Types: Cigars    Quit date: 04/02/2010    Years since quitting: 10.2  . Smokeless tobacco: Never Used  . Tobacco comment: Quit 4 years ago  Vaping Use  . Vaping Use: Never used  Substance Use Topics  . Alcohol use: No    Alcohol/week: 2.0 standard drinks    Types: 2 Cans of beer per week  Comment: Used to be a heavy drinker, quit for 5 months now  . Drug use: No    Allergies as of 06/12/2020 - Review Complete 06/12/2020  Allergen Reaction Noted  . Bee venom Anaphylaxis 07/23/2015  . Sulfa antibiotics Other (See Comments) 07/23/2015  . Duloxetine Nausea Only 08/05/2019    Review of Systems:    All systems reviewed and negative except where noted in HPI.   Physical Exam:  Vital signs in last 24 hours: Vitals:   06/13/20 1943 06/14/20 0419 06/14/20 0754 06/14/20 1133  BP: (!) 128/59 (!) 103/56 (!) 110/59 130/66  Pulse: 62 (!) 57 (!) 57 61  Resp: 16 16 18 18   Temp: 97.9 F (36.6 C) 98 F (36.7 C) 98.7 F (37.1 C) 98.8 F (37.1 C)  TempSrc: Oral Oral Oral Oral  SpO2: 100% 100% 100% 100%  Weight:      Height:       Last BM Date: 06/13/20 General:   Pleasant, cooperative in NAD Head:  Normocephalic and atraumatic. Eyes:   No icterus.   Conjunctiva pink. PERRLA. Ears:  Normal auditory acuity. Neck:  Supple; no masses or thyroidomegaly Lungs: Respirations even and unlabored. Lungs clear to auscultation bilaterally.   No wheezes, crackles, or rhonchi.  Abdomen:  Soft, nondistended, nontender. Normal bowel sounds. No appreciable masses or hepatomegaly.  No rebound or guarding.  Neurologic:  Alert and oriented x3;  grossly normal  neurologically. Skin:  Intact without significant lesions or rashes. Cervical Nodes:  No significant cervical adenopathy. Psych:  Alert and cooperative. Normal affect.  LAB RESULTS: Recent Labs    06/12/20 0834 06/13/20 0416  WBC 6.2 5.9  HGB 9.9* 8.9*  HCT 29.1* 25.9*  PLT 128* 110*   BMET Recent Labs    06/12/20 0834 06/13/20 0416 06/14/20 0439  NA 133* 135 134*  K 4.1 3.9 3.9  CL 104 109 107  CO2 20* 20* 20*  GLUCOSE 125* 105* 90  BUN 21 21 15   CREATININE 1.36* 1.27* 1.00  CALCIUM 9.0 8.5* 8.4*   LFT Recent Labs    06/14/20 0439  PROT 6.2*  ALBUMIN 2.6*  AST 63*  ALT 27  ALKPHOS 76  BILITOT 1.5*  BILIDIR 0.4*  IBILI 1.1*   PT/INR Recent Labs    06/12/20 0920  LABPROT 17.0*  INR 1.4*    STUDIES: No results found.    Impression / Plan:   Jeff Ward is a 70 y.o. y/o male with history of alcohol cirrhosis, currently abstinent, presented with altered mental status due to insufficient lactulose intake at home  Altered mental status has completely resolved with appropriate lactulose dosing along with rifaximin  Meld NA score 19 on admission, indicating 3 to 4% mortality rate over 90 days  Patient and his son's main question was in regard to his altered mental status and how often they can expect this and what to do if this reoccurs.  I discussed with them that close follow-up with gastroenterologist as an outpatient, and titration of his medications including lactulose would be important to avoid future episodes of confusion.  Patient should be having 2-3 bowel movements a day with lactulose and if not at this goal, we may need to increase the dose.  They verbalized understanding of this  They state hospital staff is working to see if they can find assistance with rifaximin given that they were not able to get it in the past due to cost.  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 Verlin Grills note.  Patient agrees to close follow-up with Dr. Marius Ditch in clinic after discharge  Thank you for involving me in the care of this patient.      LOS: 2 days   Virgel Manifold, MD  06/14/2020, 4:07 PM

## 2020-06-15 DIAGNOSIS — K7290 Hepatic failure, unspecified without coma: Secondary | ICD-10-CM | POA: Diagnosis not present

## 2020-06-15 MED ORDER — LACTULOSE 10 GM/15ML PO SOLN: 20.0000 g | Freq: Two times a day (BID) | ORAL | 0 refills | Status: DC

## 2020-06-15 MED ORDER — BUSPIRONE HCL 7.5 MG PO TABS: 7.5000 mg | ORAL_TABLET | Freq: Two times a day (BID) | ORAL | 1 refills | Status: AC

## 2020-06-15 MED ORDER — RIFAXIMIN 550 MG PO TABS: 550.0000 mg | ORAL_TABLET | Freq: Two times a day (BID) | ORAL | 0 refills | Status: DC

## 2020-06-15 NOTE — Discharge Summary (Addendum)
Physician Discharge Summary  Jeff Ward ZOX:[id number] DOB: 11/08/1950 DOA: 06/12/2020  PCP: Adin Hector, MD  Admit date: 06/12/2020 Discharge date: 06/15/2020  Admitted From: Home  Disposition: Home   Recommendations for Outpatient Follow-up:  1. Follow up with PCP in 1-2 weeks 2. Please obtain BMP/CBC in one week 3. Keep appointment with Dr. Marius Ditch.  4. Out patient Palliative care.    Home Health: decline  Discharge Condition: Stable.  CODE STATUS: DNR Diet recommendation: Heart Healthy   Brief/Interim Summary: 70 year old with past medical history significant for alcoholic liver cirrhosis, GERD, dyslipidemia, depression who presented to the ER via EMS for further evaluation of altered mental status that is started 1 day prior to admission.  Per family patient has been more lethargic and confused.  He might have missed, refused a dose of lactulose over the weekend.  Evaluation in the ED alkaline phosphatase 90, AST 49, ALT 25, ammonia 138, INR 1.4 chest x-ray no acute pulmonary disease.  CT head without contrast prior tiny lacunar infarct no acute infarct evident.   1-Acute Hepatic Encephalopathy:  -Patient does  not appear to have an acute infection, no fever or leukocytosis.  -No worsening ascites.  -he missed a dose of lactulose. Explain to family he needs to have 3 -4 BM per day. Lactulose can be adjusted for that.  -LFT stable, INR stable. LFT mildly elevated today.  -Melds score 15 points, 6% 3 month mortality.  -palliative consulted for goals of care.  -GI Consulted. recommend to continue with lactulose and diuretics.  -Received IV ceftriaxone prophylaxis.  -Ammonia Down to 39 -Discharge on lactulose 20 gr (83ml) to BID. Could take extra dose for BM goal of 3-4  -Patient to follow up with Dr Marius Ditch. continue with lasix, spironolactone.  -CM provided some assistance for rifaximin,  2-Depression: Continue with buspirone and sertraline Decreased buspirone due to  cirrhosis.   Dehydration:  Mild  increase creatinine Renal function improved.  Stop fluids GERD: Continue with PPI Lactic acidosis: Likely related to liver disease Covid PNA was not active this admission.  CKD stage 1.  Estimated body mass index is 22.42 kg/m as calculated from the following:   Height as of this encounter: 6' (1.829 m).   Weight as of this encounter: 75 kg.     Discharge Diagnoses:  Principal Problem:   Hepatic encephalopathy ([place]) Active Problems:   Essential hypertension, benign   Depression   GERD (gastroesophageal reflux disease)   Alcoholic cirrhosis (H[facility])   Dehydration    Discharge Instructions  Discharge Instructions    Diet - low sodium heart healthy   Complete by: As directed    Increase activity slowly   Complete by: As directed      Allergies as of 06/15/2020      Reactions   Bee Venom Anaphylaxis   Sulfa Antibiotics Other (See Comments)   Reaction:  Fainting    Duloxetine Nausea Only      Medication List    STOP taking these medications   amLODipine 5 MG tablet Commonly known as: NORVASC     TAKE these medications   aspirin 81 MG EC tablet Take 1 tablet (81 mg total) by mouth daily. Swallow whole.   atorvastatin 40 MG tablet Commonly known as: LIPITOR Take 40 mg by mouth daily.   busPIRone 7.5 MG tablet Commonly known as: BUSPAR Take 1 tablet (7.5 mg total) by mouth 2 (two) times daily. What changed:   how much to take  when to take this   EPINEPHrine 0.3 mg/0.3 mL Soaj injection Commonly known as: EPI-PEN Inject into the muscle.   fluocinolone 0.01 % external solution Commonly known as: SYNALAR Apply topically 2 (two) times daily.   furosemide 20 MG tablet Commonly known as: LASIX Take 3 tablets (60 mg total) by mouth daily. What changed:   how much to take  when to take this   ketoconazole 2 % cream Commonly known as: NIZORAL Apply topically.   lactulose 10 GM/15ML solution Commonly known as:  CHRONULAC Take 30 mLs (20 g total) by mouth 2 (two) times daily. What changed: See the new instructions.   pantoprazole 40 MG tablet Commonly known as: PROTONIX Take 1 tablet (40 mg total) by mouth daily.   propranolol 10 MG tablet Commonly known as: INDERAL Take 10 mg by mouth 2 (two) times daily.   rifaximin 550 MG Tabs tablet Commonly known as: XIFAXAN Take 1 tablet (550 mg total) by mouth 2 (two) times daily.   sertraline 25 MG tablet Commonly known as: ZOLOFT Take 25 mg by mouth daily.   spironolactone 50 MG tablet Commonly known as: ALDACTONE Take 3 tablets (150 mg total) by mouth daily.   triamcinolone cream 0.1 % Commonly known as: KENALOG APPLY TO AFFECTED AREA TWICE A DAY   Ventolin HFA 108 (90 Base) MCG/ACT inhaler Generic drug: albuterol Inhale 2 puffs into the lungs every 6 (six) hours as needed for wheezing or shortness of breath.       Follow-up Information    Adin Hector, MD On 06/18/2020.   Specialty: Internal Medicine Why: @3 :00pm Contact information: [place] [address] [phone]        Lin Landsman, MD Follow up.   Specialty: Gastroenterology Why: keep appointment.  Contact information: [place] [address] [phone]              Allergies  Allergen Reactions  . Bee Venom Anaphylaxis  . Sulfa Antibiotics Other (See Comments)    Reaction:  Fainting   . Duloxetine Nausea Only    Consultations:  GI   Procedures/Studies: CT Head Wo Contrast  Result Date: 06/12/2020 CLINICAL DATA:  Altered mental status EXAM: CT HEAD WITHOUT CONTRAST TECHNIQUE: Contiguous axial images were obtained from the base of the skull through the vertex without intravenous contrast. COMPARISON:  July 26, 2015 head CT; brain MRI August 10, 2015 FINDINGS: Brain: There is stable age related volume loss. There is no intracranial mass, hemorrhage, extra-axial fluid collection,  or midline shift. Evidence of prior tiny lacunar infarct in head of the caudate nucleus on the right. There is slight decreased attenuation in portions of the centra semiovale. No acute infarct is evident. Vascular: No hyperdense vessel. There is calcification in each carotid siphon region. Skull: Bony calvarium appears intact. Sinuses/Orbits: There is opacification in in an anterior right ethmoid air cell. There is mild mucosal thickening in several ethmoid air cells. Orbits appear symmetric bilaterally. Other: Visualized mastoid air cells are clear. IMPRESSION: Slight periventricular small vessel disease. Prior tiny lacunar infarct in the head of the caudate nucleus on the right. No mass or hemorrhage. No acute infarct evident. There are foci of arterial vascular calcification. Foci of ethmoid paranasal sinus disease noted. Electronically Signed   By: Lowella Grip III M.D.   On: 06/12/2020 09:42   DG Chest Portable 1 View  Result Date: 06/12/2020 CLINICAL DATA:  Weakness, altered mental status EXAM:  PORTABLE CHEST 1 VIEW COMPARISON:  04/13/2020 FINDINGS: Heart is normal size. Aortic atherosclerosis. No confluent opacities or effusions. No acute bony abnormality. IMPRESSION: No active disease. Electronically Signed   By: Rolm Baptise M.D.   On: 06/12/2020 08:57     Subjective: He is alert and oriented, he had 3-4 bowel movement yesterday.  He is feeling okay  Discharge Exam: Vitals:   06/15/20 0347 06/15/20 0816  BP: (!) 112/59 119/62  Pulse: 61 (!) 57  Resp: 16 16  Temp: (!) 97.5 F (36.4 C) 98.4 F (36.9 C)  SpO2: 100% 100%     General: Pt is alert, awake, not in acute distress Cardiovascular: RRR, S1/S2 +, no rubs, no gallops Respiratory: CTA bilaterally, no wheezing, no rhonchi Abdominal: Soft, NT, ND, bowel sounds + Extremities: no edema, no cyanosis    The results of significant diagnostics from this hospitalization (including imaging, microbiology, ancillary and  laboratory) are listed below for reference.     Microbiology: No results found for this or any previous visit (from the past 240 hour(s)).   Labs: BNP (last 3 results) No results for input(s): BNP in the last 8760 hours. Basic Metabolic Panel: Recent Labs  Lab 06/13/20 0416 06/14/20 0439  NA 135 134*  K 3.9 3.9  CL 109 107  CO2 20* 20*  GLUCOSE 105* 90  BUN 21 15  CREATININE 1.27* 1.00  CALCIUM 8.5* 8.4*   Liver Function Tests: Recent Labs  Lab 06/14/20 0439  AST 63*  ALT 27  ALKPHOS 76  BILITOT 1.5*  PROT 6.2*  ALBUMIN 2.6*   No results for input(s): LIPASE, AMYLASE in the last 168 hours. Recent Labs  Lab 06/13/20 0828  AMMONIA 39*   CBC: Recent Labs  Lab 06/13/20 0416  WBC 5.9  HGB 8.9*  HCT 25.9*  MCV 86.6  PLT 110*   Cardiac Enzymes: No results for input(s): CKTOTAL, CKMB, CKMBINDEX, TROPONINI in the last 168 hours. BNP: Invalid input(s): POCBNP CBG: No results for input(s): GLUCAP in the last 168 hours. D-Dimer No results for input(s): DDIMER in the last 72 hours. Hgb A1c No results for input(s): HGBA1C in the last 72 hours. Lipid Profile No results for input(s): CHOL, HDL, LDLCALC, TRIG, CHOLHDL, LDLDIRECT in the last 72 hours. Thyroid function studies No results for input(s): TSH, T4TOTAL, T3FREE, THYROIDAB in the last 72 hours.  Invalid input(s): FREET3 Anemia work up No results for input(s): VITAMINB12, FOLATE, FERRITIN, TIBC, IRON, RETICCTPCT in the last 72 hours. Urinalysis    Component Value Date/Time   COLORURINE YELLOW (A) 06/12/2020 1003   APPEARANCEUR CLEAR (A) 06/12/2020 1003   LABSPEC 1.012 06/12/2020 1003   PHURINE 6.0 06/12/2020 1003   GLUCOSEU NEGATIVE 06/12/2020 1003   [place] 06/12/2020 1003   [place] 06/12/2020 1003   [place] 06/12/2020 1003   PROTEINUR NEGATIVE 06/12/2020 1003   NITRITE NEGATIVE 06/12/2020 1003   LEUKOCYTESUR NEGATIVE 06/12/2020 1003   Sepsis Labs Invalid  input(s): PROCALCITONIN,  WBC,  LACTICIDVEN Microbiology No results found for this or any previous visit (from the past 240 hour(s)).   Time coordinating discharge: 40 minutes  SIGNED:   Elmarie Shiley, MD  [facility]

## 2020-06-15 NOTE — TOC Transition Note (Signed)
Transition of Care [facility]) - CM/SW Discharge Note   Patient Details  Name: Jeff Ward MRN: [id number] Date of Birth: March 11, 1950  [facility] CM/SW Contact:  Shelbie Hutching, RN Phone Number: 06/15/2020, 12:33 PM   Clinical Narrative:    Patient is medically cleared for discharge home today.  Patient's son is coming to pick him up today. OP palliative referral has been given to [facility] with [facility]  Jeff Ward notified of discharge today.  Family provides 24/7 care and did not express that they wanted home health services.  When RNCM asked about home health the family responded that patient has 24/7 help and supervision.    Final next level of care: Home/Self Care Barriers to Discharge: Barriers Resolved   Patient Goals and CMS Choice Patient states their goals for this hospitalization and ongoing recovery are:: Family is ready for Outpatient palliative services and wants to have a honest direct conversation with the provider about prognosis CMS Medicare.gov Compare Post Acute Care list provided to:: Patient Represenative (must comment) Choice offered to / list presented to : Sibling  Discharge Placement                       Discharge Plan and Services   Discharge Planning Services: CM Consult            DME Arranged: N/A DME Agency: NA       HH Arranged: Refused HH          Social Determinants of Health (SDOH) Interventions     Readmission Risk Interventions Readmission Risk Prevention Plan 06/13/2020  Transportation Screening Complete  PCP or Specialist Appt within 3-5 Days Complete  HRI or [place] Complete  Social Work Consult for [place] Planning/Counseling Complete  Palliative Care Screening Complete  Medication Review Press photographer) Complete  Some recent data might be hidden

## 2020-06-15 NOTE — Care Management Important Message (Signed)
Important Message  Patient Details  Name: Jeff Ward MRN: [id number] Date of Birth: 1950-09-22   Medicare Important Message Given:  Yes     Dannette Barbara 06/15/2020, 10:56 AM

## 2020-06-15 NOTE — Discharge Instructions (Signed)
You need to have 3 to 4 BM per day, You could have extra dose of lactulose if you dont have 3 BM per day,

## 2020-06-18 ENCOUNTER — Telehealth: Payer: Self-pay

## 2020-06-18 NOTE — Telephone Encounter (Signed)
-----   Message from Lin Landsman, MD sent at 06/15/2020  2:11 PM EDT ----- Caryl Pina  Please call him for an earlier appointment, ok to overbook  Thanks RV   ----- Message ----- From: Virgel Manifold, MD Sent: 06/15/2020  11:45 AM EDT To: Lin Landsman, MD, Shelby [place], CMA  Hi, this pt has an appt with Dr. Marius Ditch on June 20th, but it needs to be moved up since he was just in the hospital.

## 2020-06-18 NOTE — Telephone Encounter (Signed)
-----   Message from Virgel Manifold, MD sent at 06/15/2020 11:45 AM EDT ----- Hi, this pt has an appt with Dr. Marius Ditch on June 20th, but it needs to be moved up since he was just in the hospital.

## 2020-06-18 NOTE — Telephone Encounter (Signed)
Tried to call patient mailbox is full sent mychart message

## 2020-06-20 ENCOUNTER — Telehealth: Payer: Self-pay | Admitting: Primary Care

## 2020-06-20 NOTE — Telephone Encounter (Signed)
Spoke with patient regarding the Palliative referral/services and all questions were answered and he was in agreement with starting services.  I have scheduled an In-home Consult for 06/28/20 @ 12:45 PM

## 2020-06-21 ENCOUNTER — Telehealth: Payer: Self-pay | Admitting: Primary Care

## 2020-06-21 NOTE — Telephone Encounter (Signed)
Rec'd call from patient's aunt, Darcus Pester, regarding the Palliative referral, she had a lot of questions regarding our services and all questions were answered. Aunt requested that we not contact the patient in the future, due to patient doesn't always remember what is being said, she requested that we contact patient's sister or herself.  Aunt wanted to reschedule the Palliative Consult so that she could be available as well, this was rescheduled for 6/7/2 @ 12:30 PM.

## 2020-06-27 DIAGNOSIS — I872 Venous insufficiency (chronic) (peripheral): Secondary | ICD-10-CM | POA: Diagnosis not present

## 2020-06-27 DIAGNOSIS — Z862 Personal history of diseases of the blood and blood-forming organs and certain disorders involving the immune mechanism: Secondary | ICD-10-CM | POA: Diagnosis not present

## 2020-06-27 DIAGNOSIS — R69 Illness, unspecified: Secondary | ICD-10-CM | POA: Diagnosis not present

## 2020-06-27 DIAGNOSIS — I10 Essential (primary) hypertension: Secondary | ICD-10-CM | POA: Diagnosis not present

## 2020-06-27 DIAGNOSIS — Z09 Encounter for follow-up examination after completed treatment for conditions other than malignant neoplasm: Secondary | ICD-10-CM | POA: Diagnosis not present

## 2020-06-28 ENCOUNTER — Other Ambulatory Visit: Payer: Self-pay | Admitting: Primary Care

## 2020-06-29 ENCOUNTER — Telehealth: Payer: Self-pay | Admitting: Gastroenterology

## 2020-06-29 NOTE — Telephone Encounter (Signed)
[phone]-Patient's sister wanted to know if our office had a sooner appt. Clinical staff will follow up with patient.

## 2020-07-02 NOTE — Telephone Encounter (Signed)
Informed patient sister that 07/16/20 was the first appointment we had. Informed her that we would put him on the wait list and if anything comes up sooner we would call

## 2020-07-03 ENCOUNTER — Other Ambulatory Visit: Payer: Medicare HMO | Admitting: Primary Care

## 2020-07-03 ENCOUNTER — Other Ambulatory Visit: Payer: Self-pay

## 2020-07-03 DIAGNOSIS — K7030 Alcoholic cirrhosis of liver without ascites: Secondary | ICD-10-CM

## 2020-07-03 DIAGNOSIS — Z515 Encounter for palliative care: Secondary | ICD-10-CM | POA: Diagnosis not present

## 2020-07-03 DIAGNOSIS — R69 Illness, unspecified: Secondary | ICD-10-CM | POA: Diagnosis not present

## 2020-07-03 DIAGNOSIS — K7290 Hepatic failure, unspecified without coma: Secondary | ICD-10-CM

## 2020-07-03 DIAGNOSIS — M51369 Other intervertebral disc degeneration, lumbar region without mention of lumbar back pain or lower extremity pain: Secondary | ICD-10-CM

## 2020-07-03 DIAGNOSIS — M5136 Other intervertebral disc degeneration, lumbar region: Secondary | ICD-10-CM

## 2020-07-03 DIAGNOSIS — K7682 Hepatic encephalopathy: Secondary | ICD-10-CM

## 2020-07-03 NOTE — Progress Notes (Signed)
Jeff Ward, [facility] Palliative Care Consult Note Telephone: [phone]  Fax: [phone]    Date of encounter: 07/03/20 PATIENT NAME: Jeff Ward [address] [place] [address]   [phone] (home)  DOB: Oct 17, 1950 MRN: [id number] PRIMARY CARE PROVIDER:    Adin Hector, MD,  [address] [place] [address] [phone]  REFERRING PROVIDER:   Adin Hector, MD [facility] [place],  [place] [address] [phone]  RESPONSIBLE PARTY:    Contact Information    Name Relation Home Work Aledo Sister   [phone]   Fernande Bras   [phone]   Madex, Seals Daughter [phone]     Reyaan, Thoma   [phone]       I met face to face with patient and family ( sister and aunt)  in  home. Palliative Care was asked to follow this patient by consultation request of  Jeff Hector, MD to address advance care planning and complex medical decision making. This is the initial visit.                                     ASSESSMENT AND PLAN / RECOMMENDATIONS:   Advance Care Planning/Goals of Care: Goals include to maximize quality of life and symptom management. Our advance care planning conversation included a discussion about:     The value and importance of advance care planning   Experiences with loved ones who have been seriously ill or have died   Exploration of personal, cultural or spiritual beliefs that might influence medical decisions   Exploration of goals of care in the event of a sudden injury or illness   Identification of a healthcare agent - Sister Jeff Ward  Review of an  advance directive document .  Decision not to resuscitate -discussed as it is on file in Jeff Ward. Hard copy also given for in home but they may reconsider. Reiterates today he wants DNR on file.   CODE STATUS: Has DNR in home, and MOST with full code. We will continue  to clarify goals.    We discussed advance care planning. Patient described recent bout of hospitalizations over the spring and he has a DNR in the chart but a most with full code noted. We discussed his wishes. He has a power of attorney who is his sister Jeff Ward. He also has an aunt involved who is adamant that she would have him  be resuscitated if there was a chance that he could survive. We discussed power of attorney being invoked when someone cannot longer make his/her  own decision and that is power of attorney upholds the patient's wishes. There appears to be some further clarification needed.   Patient goals were that he would have a good quality of life and be able to feel good enough to enjoy friends and family. He repeated he does not want to become a vegetable. Family discussed that they understood his desire for good quality of life and that they would further that goal with any decisions they had to make on his behalf. I left the most form in the home which we will discuss again on my next trip.  Symptom Management/Plan:   Elevated ammonia levels: We discussed his recent bouts with elevated ammonia. He states some things have changed to help him  deal with this ongoing problem.  He states he was not aware that he should be seeking three stools daily with his lactulose protocol and so he states now that he is doing that. His lactulose on leaving the hospital is 39. We discussed him having another ammonia level drawn at his primary provider with a different reference range. I discussed referring to reference ranges for normal ranges.   We discussed that his insurance has not paid for the Xifaxan,  and that he would want to pursue getting this reimbursed. He has G.I. new patient visit with Dr. Marius Ward in two weeks. Family was adamant that this needs to be sooner but  since it's already two weeks away; other than being on a cancelation list I assured them that the best thing to do is wait to  see the new provider, use the lactulose and if they do begin to feel he's compromised, to go to emergency.   Ascites and anasarca these were pronounced at his hospitalization a month ago. Now he is normal weight; his dry weight is 161, no ascites, and he's eating well. He's weighing daily and has diuretic protocol for  3 pound weight gain in a day. We went over this as well. He is taking enough lactulose to achieve three stores a day. I reviewed  imaging (CT and echo)  and ecg from previous hospitalizations.  Today  lungs are clear, no ascites, no anasarca. He explained that he was told he would be back within a few days with an exacerbation but so far he has remained at normal baseline x 3 weeks.  Patient reported he had had an erratic living situation prior to this last hospitalization with caregivers and living situation being labile. He attributes a more stable living situation to better outcome. I would concur. He seems to have a good relationship with his family members and states he plans to be completely compliant with all of the recommendations.    We discussed more frequent ammonia monitoring as family is worried that they will not get him to the emergency room in time or that he will not be seen in a timely fashion. I encourage them to monitor for early signs of any ammonia changes and to be compliant with recommendations.   Follow up Palliative Care Visit: Palliative care will continue to follow for complex medical decision making, advance care planning, and clarification of goals. Return 4 weeks or prn.  I spent 75 minutes providing this consultation. More than 50% of the time in this consultation was spent in counseling and care coordination.   PPS: 40%  HOSPICE ELIGIBILITY/DIAGNOSIS: TBD  Chief Complaint: weakness, managing liver disease HISTORY OF PRESENT ILLNESS:  Jeff Ward is a 70 y.o. year old male  with  Alcoholic cirrhosis, esophageal varices, NSTEMI, encephalopathy. He  presents today with weakness following very elevated ammonia last month, and a desire to regulate his disease more carefully .   History obtained from review of EMR, discussion with primary team, and interview with family, facility staff/caregiver and/or Jeff Ward.  I reviewed available labs, medications, imaging, studies and related documents from the EMR.  Records reviewed and summarized above.   ROS   General: NAD EYES: denies vision changes ENMT: denies dysphagia Cardiovascular:  Denies edema, denies DOE Pulmonary: denies cough, denies increased SOB Abdomen: endorses good appetite, denies constipation, endorses continence of bowel GU: denies dysuria, endorses continence of urine MSK: endorses some  weakness,  no falls reported in past 3  months Skin: denies rashes or wounds Neurological: denies pain, denies insomnia Psych: Endorses positive mood Heme/lymph/immuno: denies bruises, abnormal bleeding  Physical Exam: Current and past weights: 161.8 lbs at MD, 3/22 171 lbs.  Constitutional: NAD, 129/63 HR 57 RR 18  General: frail appearing, thin EYES: anicteric sclera, lids intact, no discharge  ENMT: intact hearing, oral mucous membranes moist, dentition intact CV: S1S2, RRR, no LE edema Pulmonary: LCTA, no increased work of breathing, no cough, room air, 99% = PO2 Abdomen: intake 100%, normo-active BS + 4 quadrants, soft and non tender, no ascites GU: deferred MSK: + sarcopenia, moves all extremities, ambulatory with cane, has rollator Skin: warm and dry, no rashes or wounds on visible skin Neuro:  Mild generalized weakness,  no evident cognitive impairment Psych:mildly  anxious affect, A and O x 3 Hem/lymph/immuno: no widespread bruising   CURRENT PROBLEM LIST:  Patient Active Problem List   Diagnosis Date Noted  . Dehydration 06/12/2020  . Hepatic encephalopathy ([place]) 04/13/2020  . Encephalopathy acute 04/13/2020  . Portal hypertension ([place]) 04/13/2020  . Physical  deconditioning 04/13/2020  . NSTEMI (non-ST elevated myocardial infarction) ([place]) 04/01/2020  . Hypokalemia 02/29/2020  . Hypotension 02/29/2020  . Prolonged QT interval 02/29/2020  . Symptomatic anemia 02/29/2020  . Occult blood in stools 02/29/2020  . Thrombocytopenia ([place]) 02/29/2020  . SI (sacroiliac) joint inflammation ([place]) 03/31/2016  . Encephalopathy, hepatic ([place]) 03/05/2016  . Esophageal varices without bleeding ([place]) 03/05/2016  . Right foot pain 01/31/2016  . Hyperkalemia 01/31/2016  . Alcoholic cirrhosis of liver with ascites ([place]) 08/06/2015  . Secondary esophageal varices with bleeding ([place])   . Alcoholic cirrhosis ([place]) 89/37/3428  . Liver cirrhosis ([place]) 06/19/2015  . GERD (gastroesophageal reflux disease) 03/22/2015  . Personal history of colonic polyps 04/27/2013  . Depression 02/17/2013  . Hyperglycemia 07/23/2011  . OSA (obstructive sleep apnea) 04/10/2011  . Testosterone deficiency 03/06/2011  . Rectal bleeding 01/09/2011  . Degenerative disc disease, lumbar 05/09/2010  . OTHER NONSPECIFIC ABNORMAL SERUM ENZYME LEVELS 05/23/2009  . Essential hypertension, benign 09/01/2007  . COLONIC POLYPS 10/23/2006  . HLD (hyperlipidemia) 10/23/2006  . PUD 10/23/2006  . 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 CARPAL TUNNEL SYNDROME, BILATERAL, HX OF 10/23/2006  . BENIGN PROSTATIC HYPERTROPHY, HX OF 10/23/2006   PAST MEDICAL HISTORY:  Active Ambulatory Problems    Diagnosis Date Noted  . COLONIC POLYPS 10/23/2006  . HLD (hyperlipidemia) 10/23/2006  . Essential hypertension, benign 09/01/2007  . PUD 10/23/2006  . 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 OTHER NONSPECIFIC ABNORMAL SERUM ENZYME LEVELS 05/23/2009  . CARPAL TUNNEL SYNDROME, BILATERAL, HX OF 10/23/2006  . BENIGN PROSTATIC HYPERTROPHY, HX OF 10/23/2006  . Degenerative disc disease, lumbar 05/09/2010  . Rectal bleeding 01/09/2011  . Testosterone deficiency 03/06/2011  . OSA (obstructive sleep apnea)  04/10/2011  . Hyperglycemia 07/23/2011  . Depression 02/17/2013  . Personal history of colonic polyps 04/27/2013  . GERD (gastroesophageal reflux disease) 03/22/2015  . Liver cirrhosis ([place]) 06/19/2015  . Alcoholic cirrhosis ([place]) 76/81/1572  . Secondary esophageal varices with bleeding ([place])   . Alcoholic cirrhosis of liver with ascites ([place]) 08/06/2015  . Right foot pain 01/31/2016  . Hyperkalemia 01/31/2016  . Encephalopathy, hepatic ([place]) 03/05/2016  . Esophageal varices without bleeding ([place]) 03/05/2016  . SI (sacroiliac) joint inflammation ([place]) 03/31/2016  . Hypokalemia 02/29/2020  . Hypotension 02/29/2020  . Prolonged QT interval 02/29/2020  . Symptomatic anemia 02/29/2020  . Occult blood in stools 02/29/2020  . Thrombocytopenia ([place]) 02/29/2020  .  NSTEMI (non-ST elevated myocardial infarction) ([place]) 04/01/2020  . Hepatic encephalopathy ([place]) 04/13/2020  . Encephalopathy acute 04/13/2020  . Portal hypertension ([place]) 04/13/2020  . Physical deconditioning 04/13/2020  . Dehydration 06/12/2020   Resolved Ambulatory Problems    Diagnosis Date Noted  . ERECTILE DYSFUNCTION 10/23/2006  . Blood in stool 11/15/2009  . SHOULDER PAIN, RIGHT 10/21/2006  . LEG CRAMPS 05/15/2009  . Palpitations 05/15/2009  . SNORING 09/01/2007  . LIVER FUNCTION TESTS, ABNORMAL 06/04/2006  . COMPRESSION FRACTURE, SPINE 11/29/2008  . LUMBAR STRAIN 11/28/2008  . Night sweats 05/09/2010  . Fever 06/18/2010  . Headache(784.0) 09/20/2010  . Routine general medical examination at a health care facility 06/30/2011  . Cerumen impaction 07/23/2011  . Hypertriglyceridemia 08/13/2011  . Rash 08/26/2011  . Routine general medical examination at a health care facility 04/27/2013  . Leg edema 04/27/2013  . Multiple nevi 04/27/2013  . Headache 11/10/2013  . BPV (benign positional vertigo) 02/09/2014  . Subcutaneous mass 06/07/2014  . Stress at home 06/07/2014  . Enlarged liver   . Abdominal bloating  12/19/2014  . Abdominal pain 03/22/2015  . UGIB (upper gastrointestinal bleed) 07/23/2015  . GI bleed 07/23/2015  . Hematemesis with nausea   . Idiopathic esophageal varices with bleeding ([place]) 08/06/2015  . Somnolence, daytime 09/10/2015  . Loss of weight 09/10/2015  . ARF (acute renal failure) ([place]) 11/23/2015  . Visit for well man health check 01/18/2016  . Pneumonia due to COVID-19 virus 06/12/2020   Past Medical History:  Diagnosis Date  . Allergy   . Anemia   . Anxiety   . Arthritis   . Asthma   . Chronic headache   . CKD (chronic kidney disease)   . Colon polyps 08/19/2006  . Esophageal varices ([place])   . Hiatal hernia   . History of kidney stones 1970's  . Hyperlipidemia 06/2002  . Hypertension   . Liver disease   . Psoriasis   . Stroke ([place])   . Substance abuse ([place])    SOCIAL HX:  Social History   Tobacco Use  . Smoking status: Former Smoker    Years: 35.00    Types: Cigars    Quit date: 04/02/2010    Years since quitting: 10.2  . Smokeless tobacco: Never Used  . Tobacco comment: Quit 4 years ago  Substance Use Topics  . Alcohol use: No    Alcohol/week: 2.0 standard drinks    Types: 2 Cans of beer per week    Comment: Used to be a heavy drinker, quit for 5 months now   FAMILY HX:  Family History  Problem Relation Age of Onset  . CAD Mother   . CVA Mother   . Heart disease Mother   . Melanoma Father   . CVA Father   . Liver disease Brother   . Breast cancer Paternal Aunt   . Alcohol abuse Maternal Grandfather       ALLERGIES:  Allergies  Allergen Reactions  . Bee Venom Anaphylaxis  . Sulfa Antibiotics Other (See Comments)    Reaction:  Fainting   . Duloxetine Nausea Only     PERTINENT MEDICATIONS:  Outpatient Encounter Medications as of 07/03/2020  Medication Sig  . amLODipine (NORVASC) 5 MG tablet Take 5 mg by mouth daily.  Marland Kitchen atorvastatin (LIPITOR) 40 MG tablet Take 40 mg by mouth daily.  . busPIRone (BUSPAR) 7.5 MG tablet Take 1 tablet  (7.5 mg total) by mouth 2 (two) times daily.  . furosemide (LASIX) 20  MG tablet Take 3 tablets (60 mg total) by mouth daily. (Patient taking differently: Take 20 mg by mouth 3 (three) times daily.)  . lactulose (CHRONULAC) 10 GM/15ML solution Take 30 mLs (20 g total) by mouth 2 (two) times daily.  . pantoprazole (PROTONIX) 40 MG tablet Take 1 tablet (40 mg total) by mouth daily.  . propranolol (INDERAL) 10 MG tablet Take 10 mg by mouth 2 (two) times daily.  . sertraline (ZOLOFT) 25 MG tablet Take 25 mg by mouth daily.  Marland Kitchen spironolactone (ALDACTONE) 50 MG tablet Take 3 tablets (150 mg total) by mouth daily.  Marland Kitchen triamcinolone cream (KENALOG) 0.1 % APPLY TO AFFECTED AREA TWICE A DAY  . VENTOLIN HFA 108 (90 Base) MCG/ACT inhaler Inhale 2 puffs into the lungs every 6 (six) hours as needed for wheezing or shortness of breath.   . EPINEPHrine 0.3 mg/0.3 mL IJ SOAJ injection Inject into the muscle. (Patient not taking: Reported on Jul 18, 2020)  . fluocinolone (SYNALAR) 0.01 % external solution Apply topically 2 (two) times daily. (Patient not taking: Reported on 07/18/2020)  . ketoconazole (NIZORAL) 2 % cream Apply topically. (Patient not taking: Reported on 2020-07-18)  . rifaximin (XIFAXAN) 550 MG TABS tablet Take 1 tablet (550 mg total) by mouth 2 (two) times daily.  . [DISCONTINUED] aspirin EC 81 MG EC tablet Take 1 tablet (81 mg total) by mouth daily. Swallow whole.   No facility-administered encounter medications on file as of Jul 18, 2020.    This visit was coded based on medical decision making (MDM).  Thank you for the opportunity to participate in the care of Mr. Holeman.  The palliative care team will continue to follow. Please call our office at [phone] if we can be of additional assistance.   Jason Coop, NP , DNP, MPH, AGPCNP-BC, ACHPN  COVID-19 PATIENT SCREENING TOOL Asked and negative response unless otherwise noted:   Have you had symptoms of covid, tested positive or been in  contact with someone with symptoms/positive test in the past 5-10 days?

## 2020-07-05 ENCOUNTER — Telehealth: Payer: Self-pay | Admitting: Gastroenterology

## 2020-07-05 MED ORDER — RIFAXIMIN 550 MG PO TABS: 550.0000 mg | ORAL_TABLET | Freq: Two times a day (BID) | ORAL | 0 refills | Status: AC

## 2020-07-05 NOTE — Telephone Encounter (Signed)
Patient called to request a refill on Xifaxan also requested a DPR form to be mailed so he can update his file.

## 2020-07-05 NOTE — Telephone Encounter (Signed)
Refill sent of Xifaxian to patients pharmacy   Spoke with patients sister  and she wants him to have samples of Xifian until he sees the other GI doctor.  She said the doctors only gave  him 3 months to live   Magda Paganini can you help this patient out with some samples  Thanks

## 2020-07-05 NOTE — Telephone Encounter (Signed)
Okay to send in refill until his appointment with Dr. Barb Merino.  Can send him a DPR or he can sign it when he sees Dr. Barb Merino.

## 2020-07-05 NOTE — Telephone Encounter (Signed)
Sheri, I was going to work on this but it looks like he is seeing another GI Doc, Would Dr Fuller Plan let me refill this

## 2020-07-06 ENCOUNTER — Telehealth: Payer: Self-pay | Admitting: Primary Care

## 2020-07-06 NOTE — Telephone Encounter (Signed)
Called Jeff Ward to deliver message that [facility] was reviewing case for home PT and to watch for a phone call. Also I have sent some papers for a xifaxan grant program. We discussed what they should do if he has some LOC changes. We discussed that last time he was in the ED they mostly hydrated and gave lactulose. I reiterated this was the usual care, and they could definitely hydrate orally and give a few extra laculose doses. Jeff Ward also worries about elevated glucose and has a home monitor. She will check a few random glucose levels but recent past bloodwork  was not worrisome for hyperglycemia.Will f/u with home visit in 4-6 weeks.

## 2020-07-06 NOTE — Telephone Encounter (Signed)
Magda Paganini has samples for the patient to pick up.   Called patients Aunt and she is coming to pick them up today

## 2020-07-09 DIAGNOSIS — R69 Illness, unspecified: Secondary | ICD-10-CM | POA: Diagnosis not present

## 2020-07-09 DIAGNOSIS — M5136 Other intervertebral disc degeneration, lumbar region: Secondary | ICD-10-CM | POA: Diagnosis not present

## 2020-07-09 DIAGNOSIS — Z87891 Personal history of nicotine dependence: Secondary | ICD-10-CM | POA: Diagnosis not present

## 2020-07-09 DIAGNOSIS — I129 Hypertensive chronic kidney disease with stage 1 through stage 4 chronic kidney disease, or unspecified chronic kidney disease: Secondary | ICD-10-CM | POA: Diagnosis not present

## 2020-07-09 DIAGNOSIS — J45909 Unspecified asthma, uncomplicated: Secondary | ICD-10-CM | POA: Diagnosis not present

## 2020-07-09 DIAGNOSIS — Z9181 History of falling: Secondary | ICD-10-CM | POA: Diagnosis not present

## 2020-07-09 DIAGNOSIS — N189 Chronic kidney disease, unspecified: Secondary | ICD-10-CM | POA: Diagnosis not present

## 2020-07-12 DIAGNOSIS — N189 Chronic kidney disease, unspecified: Secondary | ICD-10-CM | POA: Diagnosis not present

## 2020-07-12 DIAGNOSIS — Z9181 History of falling: Secondary | ICD-10-CM | POA: Diagnosis not present

## 2020-07-12 DIAGNOSIS — R69 Illness, unspecified: Secondary | ICD-10-CM | POA: Diagnosis not present

## 2020-07-12 DIAGNOSIS — M5136 Other intervertebral disc degeneration, lumbar region: Secondary | ICD-10-CM | POA: Diagnosis not present

## 2020-07-12 DIAGNOSIS — Z87891 Personal history of nicotine dependence: Secondary | ICD-10-CM | POA: Diagnosis not present

## 2020-07-12 DIAGNOSIS — J45909 Unspecified asthma, uncomplicated: Secondary | ICD-10-CM | POA: Diagnosis not present

## 2020-07-12 DIAGNOSIS — I129 Hypertensive chronic kidney disease with stage 1 through stage 4 chronic kidney disease, or unspecified chronic kidney disease: Secondary | ICD-10-CM | POA: Diagnosis not present

## 2020-07-16 ENCOUNTER — Ambulatory Visit: Payer: Medicare HMO | Admitting: Gastroenterology

## 2020-07-16 ENCOUNTER — Telehealth: Payer: Self-pay

## 2020-07-16 ENCOUNTER — Encounter: Payer: Self-pay | Admitting: Gastroenterology

## 2020-07-16 ENCOUNTER — Other Ambulatory Visit: Payer: Self-pay

## 2020-07-16 VITALS — BP 126/69 | HR 60 | Temp 98.2°F | Ht 65.0 in | Wt 159.5 lb

## 2020-07-16 DIAGNOSIS — K7031 Alcoholic cirrhosis of liver with ascites: Secondary | ICD-10-CM | POA: Diagnosis not present

## 2020-07-16 DIAGNOSIS — N189 Chronic kidney disease, unspecified: Secondary | ICD-10-CM | POA: Diagnosis not present

## 2020-07-16 DIAGNOSIS — Z8719 Personal history of other diseases of the digestive system: Secondary | ICD-10-CM

## 2020-07-16 DIAGNOSIS — I8500 Esophageal varices without bleeding: Secondary | ICD-10-CM | POA: Diagnosis not present

## 2020-07-16 DIAGNOSIS — I129 Hypertensive chronic kidney disease with stage 1 through stage 4 chronic kidney disease, or unspecified chronic kidney disease: Secondary | ICD-10-CM | POA: Diagnosis not present

## 2020-07-16 DIAGNOSIS — Z9181 History of falling: Secondary | ICD-10-CM | POA: Diagnosis not present

## 2020-07-16 DIAGNOSIS — Z87891 Personal history of nicotine dependence: Secondary | ICD-10-CM | POA: Diagnosis not present

## 2020-07-16 DIAGNOSIS — J45909 Unspecified asthma, uncomplicated: Secondary | ICD-10-CM | POA: Diagnosis not present

## 2020-07-16 DIAGNOSIS — M5136 Other intervertebral disc degeneration, lumbar region: Secondary | ICD-10-CM | POA: Diagnosis not present

## 2020-07-16 DIAGNOSIS — R69 Illness, unspecified: Secondary | ICD-10-CM | POA: Diagnosis not present

## 2020-07-16 NOTE — Progress Notes (Signed)
Jeff Darby, MD [address]  [place]  [place], [place] [address]  Main: [phone]  Fax: [phone]    Gastroenterology Consultation  Referring Provider:     Adin Hector, MD Primary Care Physician:  Jeff Hector, MD Primary Gastroenterologist:  Dr. Cephas Ward Reason for Consultation:     Decompensated alcoholic cirrhosis        HPI:   Jeff Ward is a 70 y.o. male referred by Dr. Caryl Ward, Jeff Breslow III, MD  for consultation & management of decompensated alcoholic cirrhosis with ascites, hepatic encephalopathy, small esophageal varices status post variceal ligation in 09/2018.  Patient had worsening of ascites requiring large-volume paracentesis within last 6 months, most recently in 3/22.  He was admitted secondary to hepatic encephalopathy in 5/22.  Patient is currently on lactulose 30 mL twice daily and rifaximin samples that he received from Dr. Lynne Ward office, his previous gastroenterologist.  He is also taking spironolactone 150 mg and Lasix 60 mg daily.  He is on propranolol 20 mg daily secondary to variceal ligation in the past.  Patient is accompanied by his sister and his another sister who is his caregiver on the phone during this encounter.  Overall, patient has been doing well with regards to his appetite, encephalopathy as well as swelling of legs and ascites.  He reports that he has been able to eat good amount of protein, has been working well with physical therapy, his energy levels are improving.  He denies any swelling of legs.  He reports having good sleep at night.  He denies any black stools, rectal bleeding, nausea or vomiting.  Patient has stopped drinking alcohol since February 28, 2020  NSAIDs: None  Antiplts/Anticoagulants/Anti thrombotics: None  GI Procedures: Patient underwent several upper endoscopies in the past.  Found to have esophageal varices, underwent ligation Colonoscopy in 2015 revealed moderate size internal hemorrhoids,  mild sigmoid diverticulosis,  Past Medical History:  Diagnosis Date   Allergy    SEASONAL   Anemia    Anxiety    Arthritis    In back and all joints   Asthma    Chronic headache    now resolved   CKD (chronic kidney disease)    Colon polyps 08/19/2006   diverticulum on colonoscopy   Depression    Esophageal varices (HCC)    GERD (gastroesophageal reflux disease)    GI bleed    Hiatal hernia    History of kidney stones 1970's   Hyperlipidemia 06/2002   Hypertension    Liver disease    Psoriasis    Stroke ([place])    PT. STATED MILD   Substance abuse ([place])    H/O ETOH    Past Surgical History:  Procedure Laterality Date   CARPAL TUNNEL RELEASE  2005    / right hand   COLONOSCOPY     march 2016   ESOPHAGEAL BANDING  09/22/2017   Procedure: ESOPHAGEAL BANDING;  Surgeon: Jeff Artist, MD;  Location: [facility];  Service: Endoscopy;;   ESOPHAGEAL BANDING  10/11/2018   Procedure: ESOPHAGEAL BANDING;  Surgeon: Jeff Artist, MD;  Location: [facility];  Service: Endoscopy;;   ESOPHAGOGASTRODUODENOSCOPY N/A 07/24/2015   Procedure: ESOPHAGOGASTRODUODENOSCOPY (EGD);  Surgeon: Jeff Lame, MD;  Location: [facility] ENDOSCOPY;  Service: Endoscopy;  Laterality: N/A;  Banding   ESOPHAGOGASTRODUODENOSCOPY (EGD) WITH PROPOFOL N/A 08/14/2015   Procedure: ESOPHAGOGASTRODUODENOSCOPY (EGD) WITH PROPOFOL;  Surgeon: Jeff Artist, MD;  Location: [facility];  Service: Endoscopy;  Laterality: N/A;   ESOPHAGOGASTRODUODENOSCOPY (EGD) WITH PROPOFOL N/A 09/22/2017   Procedure: ESOPHAGOGASTRODUODENOSCOPY (EGD) WITH PROPOFOL;  Surgeon: Jeff Artist, MD;  Location: [facility];  Service: Endoscopy;  Laterality: N/A;   ESOPHAGOGASTRODUODENOSCOPY (EGD) WITH PROPOFOL N/A 10/11/2018   Procedure: ESOPHAGOGASTRODUODENOSCOPY (EGD) WITH PROPOFOL;  Surgeon: Jeff Artist, MD;  Location: [facility]OS[facility];  Service: Endoscopy;  Laterality: N/A;   LEFT HEART CATH N/A 04/02/2020   Procedure: Left Heart Cath  and Coronary Angiography;  Surgeon: Jeff Cowman, MD;  Location: [facility];  Service: Cardiovascular;  Laterality: N/A;   TONSILLECTOMY AND ADENOIDECTOMY     70 years old   VASECTOMY     Current Outpatient Medications:    amLODipine (NORVASC) 5 MG tablet, Take 5 mg by mouth daily., Disp: , Rfl:    atorvastatin (LIPITOR) 40 MG tablet, Take 40 mg by mouth daily., Disp: , Rfl:    busPIRone (BUSPAR) 7.5 MG tablet, Take 1 tablet (7.5 mg total) by mouth 2 (two) times daily., Disp: 60 tablet, Rfl: 1   EPINEPHrine 0.3 mg/0.3 mL IJ SOAJ injection, Inject into the muscle., Disp: , Rfl:    furosemide (LASIX) 20 MG tablet, Take 3 tablets (60 mg total) by mouth daily. (Patient taking differently: Take 20 mg by mouth 3 (three) times daily.), Disp: 60 tablet, Rfl: 2   ketoconazole (NIZORAL) 2 % cream, Apply topically., Disp: , Rfl:    lactulose (CHRONULAC) 10 GM/15ML solution, Take by mouth., Disp: , Rfl:    pantoprazole (PROTONIX) 40 MG tablet, Take 1 tablet by mouth daily., Disp: , Rfl:    propranolol (INDERAL) 10 MG tablet, Take 10 mg by mouth 2 (two) times daily., Disp: , Rfl:    rifaximin (XIFAXAN) 550 MG TABS tablet, Take 1 tablet (550 mg total) by mouth 2 (two) times daily., Disp: 60 tablet, Rfl: 0   sertraline (ZOLOFT) 25 MG tablet, Take 25 mg by mouth daily., Disp: , Rfl:    spironolactone (ALDACTONE) 50 MG tablet, Take 3 tablets (150 mg total) by mouth daily., Disp: 60 tablet, Rfl: 3   triamcinolone cream (KENALOG) 0.1 %, Apply topically., Disp: , Rfl:    VENTOLIN HFA 108 (90 Base) MCG/ACT inhaler, Inhale 2 puffs into the lungs every 6 (six) hours as needed for wheezing or shortness of breath. , Disp: , Rfl:    fluocinolone (SYNALAR) 0.01 % external solution, Apply topically 2 (two) times daily. (Patient not taking: No sig reported), Disp: , Rfl:     Family History  Problem Relation Age of Onset   CAD Mother    CVA Mother    Heart disease Mother    Melanoma Father    CVA  Father    Liver disease Brother    Breast cancer Paternal Aunt    Alcohol abuse Maternal Grandfather      Social History   Tobacco Use   Smoking status: Former    Pack years: 0.00    Types: Cigars    Quit date: 04/02/2010    Years since quitting: 10.2   Smokeless tobacco: Never   Tobacco comments:    Quit 4 years ago  Vaping Use   Vaping Use: Never used  Substance Use Topics   Alcohol use: Not Currently    Alcohol/week: 2.0 standard drinks    Types: 2 Cans of beer per week    Comment: Used to be a heavy drinker, quit for 5 months now   Drug use: No  Allergies as of 07/16/2020 - Review Complete 07/16/2020  Allergen Reaction Noted   Bee venom Anaphylaxis 07/23/2015   Sulfa antibiotics Other (See Comments) 07/23/2015   Duloxetine Nausea Only 08/05/2019    Review of Systems:    All systems reviewed and negative except where noted in HPI.   Physical Exam:  BP 126/69 (BP Location: Left Arm, Patient Position: Sitting, Cuff Size: Normal)   Pulse 60   Temp 98.2 F (36.8 C) (Oral)   Ht 5\' 5"  (1.651 m)   Wt 159 lb 8 oz (72.3 kg)   BMI 26.54 kg/m  No LMP for male patient.  General:   Alert, moderately built, moderately nourished, pleasant and cooperative in NAD Head:  Normocephalic and atraumatic. Eyes:  Sclera clear, no icterus.   Conjunctiva pink. Ears:  Normal auditory acuity. Nose:  No deformity, discharge, or lesions. Mouth:  No deformity or lesions,oropharynx pink & moist. Neck:  Supple; no masses or thyromegaly. Lungs:  Respirations even and unlabored.  Clear throughout to auscultation.   No wheezes, crackles, or rhonchi. No acute distress. Heart:  Regular rate and rhythm; no murmurs, clicks, rubs, or gallops. Abdomen:  Normal bowel sounds. Soft, non-tender and non-distended without masses, hepatosplenomegaly or hernias noted.  No guarding or rebound tenderness.   Rectal: Not performed Msk:  Symmetrical without gross deformities. Good, equal movement & strength  bilaterally. Pulses:  Normal pulses noted. Extremities:  No clubbing or edema.  No cyanosis. Neurologic:  Alert and oriented x3;  grossly normal neurologically. Skin:  Intact without significant lesions or rashes. No jaundice. Psych:  Alert and cooperative. Normal mood and affect.  Imaging Studies: Reviewed  Assessment and Plan:   Jeff Ward is a 70 y.o. male with history of decompensated alcoholic cirrhosis with hepatic encephalopathy, ascites, variceal bleeding s/p ligation  Decompensated alcoholic cirrhosis: Secondary liver disease work-up revealed elevated AMA in 2015, underwent liver biopsy which revealed fatty liver only.  Patient has stopped drinking alcohol since February 2022.  Meld sodium 20, child class C Portal hypertension manifested as volume overload, thrombocytopenia Volume overload: With history of ascites and swelling of legs, requiring large-volume paracentesis in 03/2020, no evidence of SBP.  Currently euvolemic on Lasix 60 mg daily and spironolactone 150 mg daily.  Patient reports being compliant with low-sodium diet.  Continue the same, recheck BMP during next visit Esophageal varices s/p ligation in 2020, recommend repeat EGD for surveillance of varices Continue propranolol 20 mg daily Anemia and thrombocytopenia, coagulopathy: Patient does have mild iron deficiency, hemoglobin is at baseline, mildly elevated PT/INR Hepatic encephalopathy: Patient is on combination of lactulose and rifaximin in order to prevent recurrence of hepatic encephalopathy.  He was recently admitted for hepatic encephalopathy.  Patient is currently taking rifaximin samples provided by the GI office as his insurance denied to pay for the most recent prescription and it was $1300 out-of-pocket.  We will do prior authorization today and if needed peer to peer.  Patient will benefit from combination of lactulose and rifaximin.  The current dose of lactulose is causing diarrhea and I am worried about  dehydration and acute kidney injury.  Therefore, I recommend long-term need for rifaximin in addition to lactulose HRS: None HCC screening: No evidence of liver lesions based on the recent CT scan in 3/22.  Recommend right upper quadrant ultrasound in 3/23.  Patient's AFP has been chronically mildly elevated.  We will recheck his serum AFP levels during next visit He is vaccinated against hepatitis A and B  Follow up in 3 months   Jeff Darby, MD

## 2020-07-16 NOTE — Telephone Encounter (Signed)
Submitted PA through Cover my meds for Xifaxan. Attached office visit note

## 2020-07-17 NOTE — Telephone Encounter (Signed)
Xifaxan approved till 01/12/2021

## 2020-07-17 NOTE — Telephone Encounter (Signed)
Informed patient caregiver that Jeff Ward was approved by insurance. She says she thinks they have a prescription at the pharmacy and will check how much the medication is at the pharmacy.

## 2020-07-18 ENCOUNTER — Ambulatory Visit: Payer: Medicare HMO | Admitting: Anesthesiology

## 2020-07-18 ENCOUNTER — Ambulatory Visit
Admission: RE | Admit: 2020-07-18 | Discharge: 2020-07-18 | Disposition: A | Discharge status: Home / Self Care | Payer: Medicare HMO | Attending: Gastroenterology | Admitting: Gastroenterology

## 2020-07-18 ENCOUNTER — Encounter
Admission: RE | Disposition: A | Discharge status: Home / Self Care | Payer: Self-pay | Source: Home / Self Care | Attending: Gastroenterology

## 2020-07-18 DIAGNOSIS — Z8719 Personal history of other diseases of the digestive system: Secondary | ICD-10-CM | POA: Diagnosis not present

## 2020-07-18 DIAGNOSIS — Z91030 Bee allergy status: Secondary | ICD-10-CM | POA: Diagnosis not present

## 2020-07-18 DIAGNOSIS — Z09 Encounter for follow-up examination after completed treatment for conditions other than malignant neoplasm: Secondary | ICD-10-CM | POA: Insufficient documentation

## 2020-07-18 DIAGNOSIS — K2289 Other specified disease of esophagus: Secondary | ICD-10-CM | POA: Insufficient documentation

## 2020-07-18 DIAGNOSIS — K7290 Hepatic failure, unspecified without coma: Secondary | ICD-10-CM | POA: Diagnosis not present

## 2020-07-18 DIAGNOSIS — K7460 Unspecified cirrhosis of liver: Secondary | ICD-10-CM | POA: Diagnosis not present

## 2020-07-18 DIAGNOSIS — Z888 Allergy status to other drugs, medicaments and biological substances status: Secondary | ICD-10-CM | POA: Diagnosis not present

## 2020-07-18 DIAGNOSIS — Z882 Allergy status to sulfonamides status: Secondary | ICD-10-CM | POA: Diagnosis not present

## 2020-07-18 DIAGNOSIS — Z87892 Personal history of anaphylaxis: Secondary | ICD-10-CM | POA: Insufficient documentation

## 2020-07-18 DIAGNOSIS — Z87891 Personal history of nicotine dependence: Secondary | ICD-10-CM | POA: Diagnosis not present

## 2020-07-18 DIAGNOSIS — K31819 Angiodysplasia of stomach and duodenum without bleeding: Secondary | ICD-10-CM | POA: Diagnosis not present

## 2020-07-18 DIAGNOSIS — K3189 Other diseases of stomach and duodenum: Secondary | ICD-10-CM | POA: Diagnosis not present

## 2020-07-18 DIAGNOSIS — Z79899 Other long term (current) drug therapy: Secondary | ICD-10-CM | POA: Diagnosis not present

## 2020-07-18 DIAGNOSIS — R69 Illness, unspecified: Secondary | ICD-10-CM | POA: Diagnosis not present

## 2020-07-18 DIAGNOSIS — I8510 Secondary esophageal varices without bleeding: Secondary | ICD-10-CM | POA: Diagnosis not present

## 2020-07-18 DIAGNOSIS — K766 Portal hypertension: Secondary | ICD-10-CM | POA: Insufficient documentation

## 2020-07-18 HISTORY — PX: ESOPHAGOGASTRODUODENOSCOPY: SHX5428

## 2020-07-18 SURGERY — EGD (ESOPHAGOGASTRODUODENOSCOPY)
Anesthesia: General

## 2020-07-18 MED ORDER — PROPOFOL 500 MG/50ML IV EMUL
INTRAVENOUS | Status: AC
  Filled 2020-07-18: qty 50

## 2020-07-18 MED ORDER — EPHEDRINE SULFATE 50 MG/ML IJ SOLN
INTRAMUSCULAR | Status: DC | PRN
  Administered 2020-07-18: 10 mg via INTRAVENOUS

## 2020-07-18 MED ORDER — PROPOFOL 10 MG/ML IV BOLUS
INTRAVENOUS | Status: AC
  Filled 2020-07-18: qty 20

## 2020-07-18 MED ORDER — PROPOFOL 500 MG/50ML IV EMUL
INTRAVENOUS | Status: DC | PRN
  Administered 2020-07-18: 180 ug/kg/min via INTRAVENOUS

## 2020-07-18 MED ORDER — PROPOFOL 10 MG/ML IV BOLUS
INTRAVENOUS | Status: DC | PRN
  Administered 2020-07-18: 100 mg via INTRAVENOUS

## 2020-07-18 MED ORDER — LIDOCAINE HCL (CARDIAC) PF 100 MG/5ML IV SOSY
PREFILLED_SYRINGE | INTRAVENOUS | Status: DC | PRN
  Administered 2020-07-18: 80 mg via INTRAVENOUS

## 2020-07-18 MED ORDER — SODIUM CHLORIDE 0.9 % IV SOLN
INTRAVENOUS | Status: DC
  Administered 2020-07-18: 20 mL/h via INTRAVENOUS

## 2020-07-18 NOTE — H&P (Signed)
Jeff Darby, MD [address]  [place]  [place] [address]  Main: [phone]  Fax: [phone] Pager: [phone]  Primary Care Physician:  Adin Hector, MD Primary Gastroenterologist:  Dr. Cephas Ward  Pre-Procedure History & Physical: HPI:  Jeff Ward is a 70 y.o. male is here for an endoscopy.   Past Medical History:  Diagnosis Date   Allergy    SEASONAL   Anemia    Anxiety    Arthritis    In back and all joints   Asthma    Chronic headache    now resolved   CKD (chronic kidney disease)    Colon polyps 08/19/2006   diverticulum on colonoscopy   Depression    Esophageal varices (HCC)    GERD (gastroesophageal reflux disease)    GI bleed    Hiatal hernia    History of kidney stones 1970's   Hyperlipidemia 06/2002   Hypertension    Liver disease    Psoriasis    Stroke ([place])    PT. STATED MILD   Substance abuse ([place])    H/O ETOH    Past Surgical History:  Procedure Laterality Date   CARPAL TUNNEL RELEASE  2005    / right hand   COLONOSCOPY     march 2016   ESOPHAGEAL BANDING  09/22/2017   Procedure: ESOPHAGEAL BANDING;  Surgeon: Ladene Artist, MD;  [facility];  Service: Endoscopy;;   ESOPHAGEAL BANDING  10/11/2018   Procedure: ESOPHAGEAL BANDING;  Surgeon: Ladene Artist, MD;  [facility]: [facility];  Service: Endoscopy;;   ESOPHAGOGASTRODUODENOSCOPY N/A 07/24/2015   Procedure: ESOPHAGOGASTRODUODENOSCOPY (EGD);  Surgeon: Lucilla Lame, MD;  Location: [facility] ENDOSCOPY;  Service: Endoscopy;  Laterality: N/A;  Banding   ESOPHAGOGASTRODUODENOSCOPY (EGD) WITH PROPOFOL N/A 08/14/2015   Procedure: ESOPHAGOGASTRODUODENOSCOPY (EGD) WITH PROPOFOL;  Surgeon: Ladene Artist, MD;  [facility];  Service: Endoscopy;  Laterality: N/A;   ESOPHAGOGASTRODUODENOSCOPY (EGD) WITH PROPOFOL N/A 09/22/2017   Procedure: ESOPHAGOGASTRODUODENOSCOPY (EGD) WITH PROPOFOL;  Surgeon: Ladene Artist, MD;  Location: [facility];  Service:  Endoscopy;  Laterality: N/A;   ESOPHAGOGASTRODUODENOSCOPY (EGD) WITH PROPOFOL N/A 10/11/2018   Procedure: ESOPHAGOGASTRODUODENOSCOPY (EGD) WITH PROPOFOL;  Surgeon: Ladene Artist, MD;  Location: [facility];  Service: Endoscopy;  Laterality: N/A;   LEFT HEART CATH N/A 04/02/2020   Procedure: Left Heart Cath and Coronary Angiography;  Surgeon: Isaias Cowman, MD;  Location: [place] [facility] LAB;  Service: Cardiovascular;  Laterality: N/A;   TONSILLECTOMY AND ADENOIDECTOMY     70 years old   VASECTOMY      Prior to Admission medications   Medication Sig Start Date End Date Taking? Authorizing Provider  amLODipine (NORVASC) 5 MG tablet Take 5 mg by mouth daily.   Yes [provider]  atorvastatin (LIPITOR) 40 MG tablet Take 40 mg by mouth daily. 12/28/19  Yes [provider]  busPIRone (BUSPAR) 7.5 MG tablet Take 1 tablet (7.5 mg total) by mouth 2 (two) times daily. 06/15/20  Yes Regalado, Belkys A, MD  EPINEPHrine 0.3 mg/0.3 mL IJ SOAJ injection Inject into the muscle.   Yes [provider]  furosemide (LASIX) 20 MG tablet Take 3 tablets (60 mg total) by mouth daily. Patient taking differently: Take 20 mg by mouth 3 (three) times daily. 04/06/20  Yes Fritzi Mandes, MD  ketoconazole (NIZORAL) 2 % cream Apply topically. 09/23/19 09/22/20 Yes [provider]  lactulose ([place]) 10 GM/15ML solution Take by mouth.  Yes [provider]  pantoprazole (PROTONIX) 40 MG tablet Take 1 tablet by mouth daily. 06/26/20  Yes [provider]  propranolol (INDERAL) 10 MG tablet Take 10 mg by mouth 2 (two) times daily. 03/04/20  Yes [provider]  rifaximin (XIFAXAN) 550 MG TABS tablet Take 1 tablet (550 mg total) by mouth 2 (two) times daily. 07/05/20 08/04/20 Yes Ladene Artist, MD  sertraline (ZOLOFT) 25 MG tablet Take 25 mg by mouth daily. 02/23/20  Yes [provider]  triamcinolone cream (KENALOG) 0.1 % Apply topically. 06/27/20 06/27/21  Yes [provider]  fluocinolone (SYNALAR) 0.01 % external solution Apply topically 2 (two) times daily. Patient not taking: No sig reported    [provider]  spironolactone (ALDACTONE) 50 MG tablet Take 3 tablets (150 mg total) by mouth daily. 04/06/20   Fritzi Mandes, MD  VENTOLIN HFA 108 [phone] Base) MCG/ACT inhaler Inhale 2 puffs into the lungs every 6 (six) hours as needed for wheezing or shortness of breath.  07/28/16   [provider]    Allergies as of 07/16/2020 - Review Complete 07/16/2020  Allergen Reaction Noted   Bee venom Anaphylaxis 07/23/2015   Sulfa antibiotics Other (See Comments) 07/23/2015   Duloxetine Nausea Only 08/05/2019    Family History  Problem Relation Age of Onset   CAD Mother    CVA Mother    Heart disease Mother    Melanoma Father    CVA Father    Liver disease Brother    Breast cancer Paternal Aunt    Alcohol abuse Maternal Grandfather     Social History   Socioeconomic History   Marital status: Legally Separated    Spouse name: Not on file   Number of children: 2   Years of education: Not on file   Highest education level: Not on file  Occupational History   Occupation: retired  Tobacco Use   Smoking status: Former    Pack years: 0.00    Types: Cigars    Quit date: 04/02/2010    Years since quitting: 10.3   Smokeless tobacco: Never   Tobacco comments:    Quit 4 years ago  Vaping Use   Vaping Use: Never used  Substance and Sexual Activity   Alcohol use: Not Currently    Alcohol/week: 2.0 standard drinks    Types: 2 Cans of beer per week    Comment: Used to be a heavy drinker, quit for 5 months now   Drug use: No   Sexual activity: Yes    Partners: Female    Comment: declined condoms  Other Topics Concern   Not on file  Social History Narrative   Independent at baseline. Has a cane that he uses occasionally. Lives at home with his wife.   Social Determinants of Health   Financial Resource Strain: Not on  file  Food Insecurity: Not on file  Transportation Needs: Not on file  Physical Activity: Not on file  Stress: Not on file  Social Connections: Not on file  Intimate Partner Violence: Not on file    Review of Systems: See HPI, otherwise negative ROS  Physical Exam: BP 116/66   Pulse (!) 59   Temp (!) 97.2 F (36.2 C) (Temporal)   Ht 5\' 5"  (1.651 m)   Wt 72.6 kg   SpO2 100%   BMI 26.63 kg/m  General:   Alert,  pleasant and cooperative in NAD Head:  Normocephalic and atraumatic. Neck:  Supple; no masses  or thyromegaly. Lungs:  Clear throughout to auscultation.    Heart:  Regular rate and rhythm. Abdomen:  Soft, nontender and nondistended. Normal bowel sounds, without guarding, and without rebound.   Neurologic:  Alert and  oriented x4;  grossly normal neurologically.  Impression/Plan: Horatio A Shedd is here for an endoscopy to be performed for cirrhosis of liver  Risks, benefits, limitations, and alternatives regarding  endoscopy have been reviewed with the patient.  Questions have been answered.  All parties agreeable.   Sherri Sear, MD  07/18/2020, 8:32 AM

## 2020-07-18 NOTE — Transfer of Care (Signed)
Immediate Anesthesia Transfer of Care Note  Patient: Jeff Ward  Procedure(s) Performed: ESOPHAGOGASTRODUODENOSCOPY (EGD)  Patient Location: PACU  Anesthesia Type:General  Level of Consciousness: sedated  Airway & Oxygen Therapy: Patient Spontanous Breathing and Patient connected to nasal cannula oxygen  Post-op Assessment: Report given to RN and Post -op Vital signs reviewed and stable  Post vital signs: Reviewed and stable  Last Vitals:  Vitals Value Taken Time  BP 90/44 07/18/20 0857  Temp    Pulse 65 07/18/20 0858  Resp 16 07/18/20 0858  SpO2 98 % 07/18/20 0858  Vitals shown include unvalidated device data.  Last Pain:  Vitals:   07/18/20 0850  TempSrc: Temporal  PainSc:          Complications: No notable events documented.

## 2020-07-18 NOTE — Op Note (Signed)
[facility] Patient Name: Jeff Ward Procedure Date: 07/18/2020 8:29 AM MRN: [id number] Account #: [id number] Date of Birth: 1950-04-13 Admit Type: Outpatient Age: 70 Room: [facility] ENDO ROOM 4 Gender: Male Note Status: Finalized Procedure:             Upper GI endoscopy Indications:           Follow-up of esophageal varices Providers:             Lin Landsman MD, MD Referring MD:          Ramonita Lab, MD (Referring MD) Medicines:             General Anesthesia Complications:         No immediate complications. Estimated blood loss: None. Procedure:             Pre-Anesthesia Assessment:                        - Prior to the procedure, a History and Physical was                         performed, and patient medications and allergies were                         reviewed. The patient is competent. The risks and                         benefits of the procedure and the sedation options and                         risks were discussed with the patient. All questions                         were answered and informed consent was obtained.                         Patient identification and proposed procedure were                         verified by the physician, the nurse, the                         anesthesiologist, the anesthetist and the technician                         in the pre-procedure area in the procedure room in the                         endoscopy suite. Mental Status Examination: alert and                         oriented. Airway Examination: normal oropharyngeal                         airway and neck mobility. Respiratory Examination:                         clear to auscultation. CV Examination: normal.  Prophylactic Antibiotics: The patient does not require                         prophylactic antibiotics. Prior Anticoagulants: The                         patient has taken no previous anticoagulant or                          antiplatelet agents. ASA Grade Assessment: III - A                         patient with severe systemic disease. After reviewing                         the risks and benefits, the patient was deemed in                         satisfactory condition to undergo the procedure. The                         anesthesia plan was to use general anesthesia.                         Immediately prior to administration of medications,                         the patient was re-assessed for adequacy to receive                         sedatives. The heart rate, respiratory rate, oxygen                         saturations, blood pressure, adequacy of pulmonary                         ventilation, and response to care were monitored                         throughout the procedure. The physical status of the                         patient was re-assessed after the procedure.                        After obtaining informed consent, the endoscope was                         passed under direct vision. Throughout the procedure,                         the patient's blood pressure, pulse, and oxygen                         saturations were monitored continuously. The Endoscope                         was introduced through the mouth, and advanced to the  second part of duodenum. The upper GI endoscopy was                         accomplished without difficulty. The patient tolerated                         the procedure well. Findings:      The duodenal bulb and second portion of the duodenum were normal.      Mild, diffuse portal hypertensive gastropathy was found in the gastric       fundus and in the gastric body.      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 Coagulation for hemostasis using argon       plasma was successful given patient's h/o IDA. Estimated blood loss:       none.      The  cardia and gastric fundus were normal on retroflexion.      Post variceal banding scar was found in the lower third of the       esophagus. No evidence of esophageal or gastric varices present Impression:            - Normal duodenal bulb and second portion of the                         duodenum.                        - Portal hypertensive gastropathy.                        - Gastric antral vascular ectasia without bleeding.                         Treated with argon plasma coagulation (APC).                        - Scar in the lower third of the esophagus.                        - No specimens collected. Recommendation:        - Discharge patient to home (with escort).                        - Low sodium diet.                        - Continue present medications.                        - Return to my office as previously scheduled. Procedure Code(s):     --- Professional ---                        [phone], Esophagogastroduodenoscopy, flexible,                         transoral; with control of bleeding, any method Diagnosis Code(s):     --- Professional ---  K76.6, Portal hypertension                        K31.89, Other diseases of stomach and duodenum                        K31.819, Angiodysplasia of stomach and duodenum                         without bleeding                        K22.8, Other specified diseases of esophagus                        I85.00, Esophageal varices without bleeding CPT copyright 2019 [employer] Association. All rights reserved. The codes documented in this report are preliminary and upon coder review may  be revised to meet current compliance requirements. Dr. Ulyess Mort Lin Landsman MD, MD 07/18/2020 8:56:48 AM This report has been signed electronically. Number of Addenda: 0 Note Initiated On: 07/18/2020 8:29 AM Estimated Blood Loss:  Estimated blood loss: none.      [facility]

## 2020-07-18 NOTE — Anesthesia Preprocedure Evaluation (Signed)
Anesthesia Evaluation  Patient identified by MRN, date of birth, ID band Patient awake    Reviewed: Allergy & Precautions, NPO status , Patient's Chart, lab work & pertinent test results  History of Anesthesia Complications Negative for: history of anesthetic complications  Airway Mallampati: II  TM Distance: >3 FB Neck ROM: Full    Dental  (+) Poor Dentition, Missing   Pulmonary asthma , sleep apnea , former smoker,    breath sounds clear to auscultation- rhonchi (-) wheezing      Cardiovascular hypertension, + Past MI  (-) Cardiac Stents and (-) CABG  Rhythm:Regular Rate:Normal - Systolic murmurs and - Diastolic murmurs Echo 06/04/72: 1. Left ventricular ejection fraction, by estimation, is 55 to 60%. The  left ventricle has normal function. Left ventricular endocardial border  not optimally defined to evaluate regional wall motion. There is mild left  ventricular hypertrophy. Left  ventricular diastolic parameters are consistent with Grade I diastolic  dysfunction (impaired relaxation). Elevated left atrial pressure.  2. Right ventricular systolic function is normal. The right ventricular  size is normal. There is normal pulmonary artery systolic pressure.  3. Left atrial size was mildly dilated.  4. The mitral valve is degenerative. Trivial mitral valve regurgitation.  No evidence of mitral stenosis.  5. The aortic valve was not well visualized. Aortic valve regurgitation  is not visualized. No aortic stenosis is present.   L heart cath 04/02/20: 1. Normal coronary anatomy 2. Normal left ventricular function    Neuro/Psych  Headaches, PSYCHIATRIC DISORDERS Anxiety Depression CVA    GI/Hepatic hiatal hernia, PUD, GERD  ,(+) Cirrhosis       ,   Endo/Other  negative endocrine ROSneg diabetes  Renal/GU CRFRenal disease     Musculoskeletal  (+) Arthritis ,   Abdominal (+) + obese,   Peds  Hematology  (+)  anemia ,   Anesthesia Other Findings Past Medical History: No date: Allergy     Comment:  SEASONAL No date: Anemia No date: Anxiety No date: Arthritis     Comment:  In back and all joints No date: Asthma No date: Chronic headache     Comment:  now resolved No date: CKD (chronic kidney disease) 08/19/2006: Colon polyps     Comment:  diverticulum on colonoscopy No date: Depression No date: Esophageal varices (HCC) No date: GERD (gastroesophageal reflux disease) No date: GI bleed No date: Hiatal hernia 1970's: History of kidney stones 06/2002: Hyperlipidemia No date: Hypertension No date: Liver disease No date: Psoriasis No date: Stroke [facility])     Comment:  PT. STATED MILD No date: Substance abuse ([place])     Comment:  H/O ETOH   Reproductive/Obstetrics                             Anesthesia Physical Anesthesia Plan  ASA: 3  Anesthesia Plan: General   Post-op Pain Management:    Induction: Intravenous  PONV Risk Score and Plan: 1 and Propofol infusion  Airway Management Planned: Natural Airway  Additional Equipment:   Intra-op Plan:   Post-operative Plan:   Informed Consent: I have reviewed the patients History and Physical, chart, labs and discussed the procedure including the risks, benefits and alternatives for the proposed anesthesia with the patient or authorized representative who has indicated his/her understanding and acceptance.     Dental advisory given  Plan Discussed with: CRNA and Anesthesiologist  Anesthesia Plan Comments:  Anesthesia Quick Evaluation  

## 2020-07-18 NOTE — Anesthesia Postprocedure Evaluation (Signed)
Anesthesia Post Note  Patient: Jeff Ward  Procedure(s) Performed: ESOPHAGOGASTRODUODENOSCOPY (EGD)  Patient location during evaluation: Endoscopy Anesthesia Type: General Level of consciousness: awake and alert and oriented Pain management: pain level controlled Vital Signs Assessment: post-procedure vital signs reviewed and stable Respiratory status: spontaneous breathing, nonlabored ventilation and respiratory function stable Cardiovascular status: blood pressure returned to baseline and stable Postop Assessment: no signs of nausea or vomiting Anesthetic complications: no   No notable events documented.   Last Vitals:  Vitals:   07/18/20 0920 07/18/20 0930  BP: (!) 117/58 (!) 95/52  Pulse: 64 65  Resp: 15 15  Temp:    SpO2: 100% 100%    Last Pain:  Vitals:   07/18/20 0850  TempSrc: Temporal  PainSc:                  Siena Poehler

## 2020-07-19 ENCOUNTER — Encounter: Payer: Self-pay | Admitting: Gastroenterology

## 2020-07-19 DIAGNOSIS — Z9181 History of falling: Secondary | ICD-10-CM | POA: Diagnosis not present

## 2020-07-19 DIAGNOSIS — J45909 Unspecified asthma, uncomplicated: Secondary | ICD-10-CM | POA: Diagnosis not present

## 2020-07-19 DIAGNOSIS — I129 Hypertensive chronic kidney disease with stage 1 through stage 4 chronic kidney disease, or unspecified chronic kidney disease: Secondary | ICD-10-CM | POA: Diagnosis not present

## 2020-07-19 DIAGNOSIS — Z87891 Personal history of nicotine dependence: Secondary | ICD-10-CM | POA: Diagnosis not present

## 2020-07-19 DIAGNOSIS — N189 Chronic kidney disease, unspecified: Secondary | ICD-10-CM | POA: Diagnosis not present

## 2020-07-19 DIAGNOSIS — M5136 Other intervertebral disc degeneration, lumbar region: Secondary | ICD-10-CM | POA: Diagnosis not present

## 2020-07-19 DIAGNOSIS — R69 Illness, unspecified: Secondary | ICD-10-CM | POA: Diagnosis not present

## 2020-07-22 ENCOUNTER — Emergency Department: Payer: Medicare HMO

## 2020-07-22 ENCOUNTER — Emergency Department
Admission: EM | Admit: 2020-07-22 | Discharge: 2020-07-22 | Disposition: A | Discharge status: Home / Self Care | Payer: Medicare HMO | Attending: Emergency Medicine | Admitting: Emergency Medicine

## 2020-07-22 ENCOUNTER — Other Ambulatory Visit: Payer: Self-pay

## 2020-07-22 DIAGNOSIS — D631 Anemia in chronic kidney disease: Secondary | ICD-10-CM | POA: Insufficient documentation

## 2020-07-22 DIAGNOSIS — J45909 Unspecified asthma, uncomplicated: Secondary | ICD-10-CM | POA: Diagnosis not present

## 2020-07-22 DIAGNOSIS — Z79899 Other long term (current) drug therapy: Secondary | ICD-10-CM | POA: Insufficient documentation

## 2020-07-22 DIAGNOSIS — R531 Weakness: Secondary | ICD-10-CM | POA: Diagnosis not present

## 2020-07-22 DIAGNOSIS — N189 Chronic kidney disease, unspecified: Secondary | ICD-10-CM | POA: Insufficient documentation

## 2020-07-22 DIAGNOSIS — R001 Bradycardia, unspecified: Secondary | ICD-10-CM | POA: Diagnosis not present

## 2020-07-22 DIAGNOSIS — Z20822 Contact with and (suspected) exposure to covid-19: Secondary | ICD-10-CM | POA: Insufficient documentation

## 2020-07-22 DIAGNOSIS — R5383 Other fatigue: Secondary | ICD-10-CM | POA: Insufficient documentation

## 2020-07-22 DIAGNOSIS — I129 Hypertensive chronic kidney disease with stage 1 through stage 4 chronic kidney disease, or unspecified chronic kidney disease: Secondary | ICD-10-CM | POA: Diagnosis not present

## 2020-07-22 DIAGNOSIS — Z87891 Personal history of nicotine dependence: Secondary | ICD-10-CM | POA: Diagnosis not present

## 2020-07-22 DIAGNOSIS — R4182 Altered mental status, unspecified: Secondary | ICD-10-CM | POA: Diagnosis not present

## 2020-07-22 DIAGNOSIS — Z743 Need for continuous supervision: Secondary | ICD-10-CM | POA: Diagnosis not present

## 2020-07-22 DIAGNOSIS — R42 Dizziness and giddiness: Secondary | ICD-10-CM | POA: Insufficient documentation

## 2020-07-22 DIAGNOSIS — R404 Transient alteration of awareness: Secondary | ICD-10-CM | POA: Diagnosis not present

## 2020-07-22 LAB — CBC
HCT: 26.8 % — ABNORMAL LOW (ref 39.0–52.0)
Hemoglobin: 9.1 g/dL — ABNORMAL LOW (ref 13.0–17.0)
MCH: 29.9 pg (ref 26.0–34.0)
MCHC: 34 g/dL (ref 30.0–36.0)
MCV: 88.2 fL (ref 80.0–100.0)
Platelets: 119 10*3/uL — ABNORMAL LOW (ref 150–400)
RBC: 3.04 MIL/uL — ABNORMAL LOW (ref 4.22–5.81)
RDW: 16.7 % — ABNORMAL HIGH (ref 11.5–15.5)
WBC: 6.1 10*3/uL (ref 4.0–10.5)
nRBC: 0 % (ref 0.0–0.2)

## 2020-07-22 LAB — URINALYSIS, COMPLETE (UACMP) WITH MICROSCOPIC
Bacteria, UA: NONE SEEN
Bilirubin Urine: NEGATIVE
Glucose, UA: NEGATIVE mg/dL
Hgb urine dipstick: NEGATIVE
Ketones, ur: NEGATIVE mg/dL
Leukocytes,Ua: NEGATIVE
Nitrite: NEGATIVE
Protein, ur: NEGATIVE mg/dL
Specific Gravity, Urine: 1.016 (ref 1.005–1.030)
pH: 5 (ref 5.0–8.0)

## 2020-07-22 LAB — COMPREHENSIVE METABOLIC PANEL
ALT: 29 U/L (ref 0–44)
AST: 55 U/L — ABNORMAL HIGH (ref 15–41)
Albumin: 2.8 g/dL — ABNORMAL LOW (ref 3.5–5.0)
Alkaline Phosphatase: 76 U/L (ref 38–126)
Anion gap: 7 (ref 5–15)
BUN: 25 mg/dL — ABNORMAL HIGH (ref 8–23)
CO2: 19 mmol/L — ABNORMAL LOW (ref 22–32)
Calcium: 9.2 mg/dL (ref 8.9–10.3)
Chloride: 108 mmol/L (ref 98–111)
Creatinine, Ser: 1.47 mg/dL — ABNORMAL HIGH (ref 0.61–1.24)
GFR, Estimated: 51 mL/min — ABNORMAL LOW (ref >60)
Glucose, Bld: 106 mg/dL — ABNORMAL HIGH (ref 70–99)
Potassium: 4.4 mmol/L (ref 3.5–5.1)
Sodium: 134 mmol/L — ABNORMAL LOW (ref 135–145)
Total Bilirubin: 1.9 mg/dL — ABNORMAL HIGH (ref 0.3–1.2)
Total Protein: 6.7 g/dL (ref 6.5–8.1)

## 2020-07-22 LAB — MAGNESIUM: Magnesium: 2 mg/dL (ref 1.7–2.4)

## 2020-07-22 LAB — BILIRUBIN, DIRECT: Bilirubin, Direct: 0.5 mg/dL — ABNORMAL HIGH (ref 0.0–0.2)

## 2020-07-22 LAB — RESP PANEL BY RT-PCR (FLU A&B, COVID) ARPGX2
Influenza A by PCR: NEGATIVE
Influenza B by PCR: NEGATIVE
SARS Coronavirus 2 by RT PCR: NEGATIVE

## 2020-07-22 LAB — TSH: TSH: 1.821 u[IU]/mL (ref 0.350–4.500)

## 2020-07-22 LAB — AMMONIA: Ammonia: 34 umol/L (ref 9–35)

## 2020-07-22 LAB — TROPONIN I (HIGH SENSITIVITY): Troponin I (High Sensitivity): 11 ng/L (ref <18)

## 2020-07-22 MED ORDER — LACTATED RINGERS IV BOLUS
500.0000 mL | Freq: Once | INTRAVENOUS | Status: AC
  Administered 2020-07-22: 500 mL via INTRAVENOUS

## 2020-07-22 NOTE — ED Provider Notes (Signed)
[facility] Emergency Department Provider Note  ____________________________________________   Event Date/Time   First MD Initiated Contact with Patient 07/22/20 1957     (approximate)  I have reviewed the triage vital signs and the nursing notes.   HISTORY  Chief Complaint Abnormal Lab   HPI Jeff Ward is a 70 y.o. male with a past medical history of decompensated alcoholic cirrhosis with ascites, hepatic encephalopathy, s CKD, GERD, HTN, HDL, anxiety, arthritis, anemia small esophageal varices status post variceal ligation in 09/2018 on daily lactulose and spironolactone who presents coming by his daughter for assessment of fatigue.  Per patient and daughter patient seemed more fatigued today sleeping almost 18 hours in last 24 hours.  Patient denies any other acute complaints including headache, earache, sore throat, fevers, chills, cough, chest pain, abdominal pain, vomiting, diarrhea, dysuria, rash or focal extremity weakness numbness or tingling.  He states he has been compliant with all his medicines.  He has not had any recent alcohol.  He has not had any falls injuries that he recalls.  No other acute concerns at this time other than fatigue and may be feeling a little more steady on his feet than usual.  He states he was worried about his pneumonia and when to get that checked today.         Past Medical History:  Diagnosis Date   Allergy    SEASONAL   Anemia    Anxiety    Arthritis    In back and all joints   Asthma    Chronic headache    now resolved   CKD (chronic kidney disease)    Colon polyps 08/19/2006   diverticulum on colonoscopy   Depression    Esophageal varices (HCC)    GERD (gastroesophageal reflux disease)    GI bleed    Hiatal hernia    History of kidney stones 1970's   Hyperlipidemia 06/2002   Hypertension    Liver disease    Psoriasis    Stroke ([place])    PT. STATED MILD   Substance abuse ([place])    H/O ETOH     Patient Active Problem List   Diagnosis Date Noted   History of esophageal varices    GAVE (gastric antral vascular ectasia)    Dehydration 06/12/2020   Hepatic encephalopathy ([place]) 04/13/2020   Encephalopathy acute 04/13/2020   Portal hypertension ([place]) 04/13/2020   Physical deconditioning 04/13/2020   NSTEMI (non-ST elevated myocardial infarction) ([place]) 04/01/2020   Hypokalemia 02/29/2020   Hypotension 02/29/2020   Prolonged QT interval 02/29/2020   Symptomatic anemia 02/29/2020   Occult blood in stools 02/29/2020   Thrombocytopenia ([place]) 02/29/2020   MGUS (monoclonal gammopathy of unknown significance) 06/18/2016   SI (sacroiliac) joint inflammation ([place]) 03/31/2016   Encephalopathy, hepatic ([place]) 03/05/2016   Esophageal varices without bleeding ([place]) 03/05/2016   Right foot pain 01/31/2016   Hyperkalemia 01/31/2016   Generalized osteoarthritis of hand 40/98/1191   Alcoholic cirrhosis of liver with ascites ([place]) 08/06/2015   Secondary esophageal varices with bleeding ([place])    Alcoholic cirrhosis ([place]) 47/82/9562   Decompensated hepatic cirrhosis ([place]) 06/19/2015   GERD (gastroesophageal reflux disease) 03/22/2015   Personal history of colonic polyps 04/27/2013   Depression 02/17/2013   Hyperglycemia 07/23/2011   OSA (obstructive sleep apnea) 04/10/2011   Testosterone deficiency 03/06/2011   Rectal bleeding 01/09/2011   Degenerative disc disease, lumbar 05/09/2010   OTHER NONSPECIFIC ABNORMAL SERUM ENZYME LEVELS 05/23/2009   Essential hypertension, benign  09/01/2007   COLONIC POLYPS 10/23/2006   HLD (hyperlipidemia) 10/23/2006   PUD 10/23/2006   Arthropathy, multiple sites 10/23/2006   CARPAL TUNNEL SYNDROME, BILATERAL, HX OF 10/23/2006   BENIGN PROSTATIC HYPERTROPHY, HX OF 10/23/2006    Past Surgical History:  Procedure Laterality Date   CARPAL TUNNEL RELEASE  2005    / right hand   COLONOSCOPY     march 2016   ESOPHAGEAL BANDING  09/22/2017   Procedure:  ESOPHAGEAL BANDING;  Surgeon: Ladene Artist, MD;  Location: [facility] ENDOSCOPY;  Service: Endoscopy;;   ESOPHAGEAL BANDING  10/11/2018   Procedure: ESOPHAGEAL BANDING;  Surgeon: Ladene Artist, MD;  Location: [facility] ENDOSCOPY;  Service: Endoscopy;;   ESOPHAGOGASTRODUODENOSCOPY N/A 07/24/2015   Procedure: ESOPHAGOGASTRODUODENOSCOPY (EGD);  Surgeon: Lucilla Lame, MD;  Location: [facility] ENDOSCOPY;  Service: Endoscopy;  Laterality: N/A;  Banding   ESOPHAGOGASTRODUODENOSCOPY N/A 07/18/2020   Procedure: ESOPHAGOGASTRODUODENOSCOPY (EGD);  Surgeon: Lin Landsman, MD;  Location[facility] ENDOSCOPY;  Service: Gastroenterology;  Laterality: N/A;   ESOPHAGOGASTRODUODENOSCOPY (EGD) WITH PROPOFOL N/A 08/14/2015   Procedure: ESOPHAGOGASTRODUODENOSCOPY (EGD) WITH PROPOFOL;  Surgeon: Ladene Artist, MD;  Location: [facility] ENDOSCOPY;  Service: Endoscopy;  Laterality: N/A;   ESOPHAGOGASTRODUODENOSCOPY (EGD) WITH PROPOFOL N/A 09/22/2017   Procedure: ESOPHAGOGASTRODUODENOSCOPY (EGD) WITH PROPOFOL;  Surgeon: Ladene Artist, MD;  Location: [facility] ENDOSCOPY;  Service: Endoscopy;  Laterality: N/A;   ESOPHAGOGASTRODUODENOSCOPY (EGD) WITH PROPOFOL N/A 10/11/2018   Procedure: ESOPHAGOGASTRODUODENOSCOPY (EGD) WITH PROPOFOL;  Surgeon: Ladene Artist, MD;  Location: [facility] ENDOSCOPY;  Service: Endoscopy;  Laterality: N/A;   LEFT HEART CATH N/A 04/02/2020   Procedure: Left Heart Cath and Coronary Angiography;  Surgeon: Isaias Cowman, MD;  Location: [place] CV LAB;  Service: Cardiovascular;  Laterality: N/A;   TONSILLECTOMY AND ADENOIDECTOMY     70 years old   VASECTOMY      Prior to Admission medications   Medication Sig Start Date End Date Taking? Authorizing Provider  amLODipine (NORVASC) 5 MG tablet Take 5 mg by mouth daily.    [provider]  atorvastatin (LIPITOR) 40 MG tablet Take 40 mg by mouth daily. 12/28/19   [provider]  busPIRone (BUSPAR) 7.5 MG tablet Take 1 tablet (7.5 mg total) by mouth 2 (two)  times daily. 06/15/20   Regalado, Belkys A, MD  EPINEPHrine 0.3 mg/0.3 mL IJ SOAJ injection Inject into the muscle.    [provider]  furosemide (LASIX) 20 MG tablet Take 3 tablets (60 mg total) by mouth daily. 04/06/20   Fritzi Mandes, MD  ketoconazole (NIZORAL) 2 % cream Apply topically. 09/23/19 09/22/20  [provider]  lactulose (CHRONULAC) 10 GM/15ML solution Take by mouth.    [provider]  pantoprazole (PROTONIX) 40 MG tablet Take 1 tablet by mouth daily. 06/26/20   [provider]  propranolol (INDERAL) 10 MG tablet Take 10 mg by mouth 2 (two) times daily. 03/04/20   [provider]  rifaximin (XIFAXAN) 550 MG TABS tablet Take 1 tablet (550 mg total) by mouth 2 (two) times daily. 07/05/20 08/04/20  Ladene Artist, MD  sertraline (ZOLOFT) 25 MG tablet Take 25 mg by mouth daily. 02/23/20   [provider]  spironolactone (ALDACTONE) 50 MG tablet Take 3 tablets (150 mg total) by mouth daily. 04/06/20   Fritzi Mandes, MD  triamcinolone cream (KENALOG) 0.1 % Apply topically. 06/27/20 06/27/21  [provider]  VENTOLIN HFA 108 (90 Base) MCG/ACT inhaler Inhale 2 puffs into the lungs every 6 (six) hours as  needed for wheezing or shortness of breath.  07/28/16   [provider]    Allergies Bee venom, Sulfa antibiotics, and Duloxetine  Family History  Problem Relation Age of Onset   CAD Mother    CVA Mother    Heart disease Mother    Melanoma Father    CVA Father    Liver disease Brother    Breast cancer Paternal Aunt    Alcohol abuse Maternal Grandfather     Social History Social History   Tobacco Use   Smoking status: Former    Pack years: 0.00    Types: Cigars    Quit date: 04/02/2010    Years since quitting: 10.3   Smokeless tobacco: Never   Tobacco comments:    Quit 4 years ago  Vaping Use   Vaping Use: Never used  Substance Use Topics   Alcohol use: Not Currently    Alcohol/week: 2.0 standard drinks    Types: 2  Cans of beer per week    Comment: Used to be a heavy drinker, quit for 5 months now   Drug use: No    Review of Systems  Review of Systems  Constitutional:  Positive for malaise/fatigue. Negative for chills and fever.  HENT:  Negative for sore throat.   Eyes:  Negative for pain.  Respiratory:  Negative for cough and stridor.   Cardiovascular:  Negative for chest pain.  Gastrointestinal:  Negative for vomiting.  Genitourinary:  Negative for dysuria.  Musculoskeletal:  Negative for myalgias.  Skin:  Negative for rash.  Neurological:  Negative for seizures, loss of consciousness and headaches.  Psychiatric/Behavioral:  Negative for suicidal ideas.   All other systems reviewed and are negative.    ____________________________________________   PHYSICAL EXAM:  VITAL SIGNS: ED Triage Vitals  Enc Vitals Group     BP 07/22/20 1757 114/62     Pulse Rate 07/22/20 1757 (!) 59     Resp 07/22/20 1757 14     Temp 07/22/20 1757 98.2 F (36.8 C)     Temp Source 07/22/20 1757 Oral     SpO2 07/22/20 1757 100 %     Weight 07/22/20 1755 160 lb (72.6 kg)     Height 07/22/20 1755 5' 5"  (1.651 m)     Head Circumference --      Peak Flow --      Pain Score 07/22/20 1755 0     Pain Loc --      Pain Edu? --      Excl. in [place]? --    Vitals:   07/22/20 2133 07/22/20 2200  BP: (!) 148/76 126/68  Pulse: 69 69  Resp:    Temp:    SpO2: 98% 100%   Physical Exam Vitals and nursing note reviewed.  Constitutional:      Appearance: He is well-developed.  HENT:     Head: Normocephalic and atraumatic.     Right Ear: External ear normal.     Left Ear: External ear normal.     Nose: Nose normal.  Eyes:     Conjunctiva/sclera: Conjunctivae normal.  Cardiovascular:     Rate and Rhythm: Normal rate and regular rhythm.     Heart sounds: No murmur heard. Pulmonary:     Effort: Pulmonary effort is normal. No respiratory distress.     Breath sounds: Normal breath sounds.  Abdominal:      Palpations: Abdomen is soft.     Tenderness: There is no abdominal tenderness.  Musculoskeletal:     Cervical back: Neck supple.  Skin:    General: Skin is warm and dry.     Capillary Refill: Capillary refill takes less than 2 seconds.  Neurological:     Mental Status: He is alert and oriented to person, place, and time.    Cranial nerves II through XII grossly intact.  No pronator drift.  No finger dysmetria.  Symmetric 5/5 strength of all extremities.  Sensation intact to light touch in all extremities.  She has a somewhat slow wide-based gait but has no significant ataxia.  He is able to ambulate without assistance.  Patient is oriented to year, month and states it is in the later half of June.  He is also oriented to place.  Per daughter at bedside this is baseline. ____________________________________________   LABS (all labs ordered are listed, but only abnormal results are displayed)  Labs Reviewed  CBC - Abnormal; Notable for the following components:      Result Value   RBC 3.04 (*)    Hemoglobin 9.1 (*)    HCT 26.8 (*)    RDW 16.7 (*)    Platelets 119 (*)    All other components within normal limits  URINALYSIS, COMPLETE (UACMP) WITH MICROSCOPIC - Abnormal; Notable for the following components:   Color, Urine YELLOW (*)    APPearance CLEAR (*)    All other components within normal limits  COMPREHENSIVE METABOLIC PANEL - Abnormal; Notable for the following components:   Sodium 134 (*)    CO2 19 (*)    Glucose, Bld 106 (*)    BUN 25 (*)    Creatinine, Ser 1.47 (*)    Albumin 2.8 (*)    AST 55 (*)    Total Bilirubin 1.9 (*)    GFR, Estimated 51 (*)    All other components within normal limits  BILIRUBIN, DIRECT - Abnormal; Notable for the following components:   Bilirubin, Direct 0.5 (*)    All other components within normal limits  RESP PANEL BY RT-PCR (FLU A&B, COVID) ARPGX2  AMMONIA  TSH  MAGNESIUM  CBG MONITORING, ED  TROPONIN I (HIGH SENSITIVITY)    ____________________________________________  EKG  Sinus bradycardia with ventricular to 55, normal axis, unremarkable intervals without evidence of acute ischemia or significant underlying arrhythmia.  ____________________________________________  RADIOLOGY  ED MD interpretation: Chest x-ray without overt consolidation, large effusion, significant edema, pneumothorax or other clear acute intrathoracic process.  Official radiology report(s): DG Chest 2 View  Result Date: 07/22/2020 CLINICAL DATA:  Acute mental status change EXAM: CHEST - 2 VIEW COMPARISON:  Jun 12, 2020 FINDINGS: The heart size and mediastinal contours are within normal limits. Both lungs are clear. The visualized skeletal structures are unremarkable. IMPRESSION: No active cardiopulmonary disease. Electronically Signed   By: Dorise Bullion III M.D   On: 07/22/2020 18:52   CT Head Wo Contrast  Result Date: 07/22/2020 CLINICAL DATA:  High ammonia levels, dizziness, mental status change EXAM: CT HEAD WITHOUT CONTRAST TECHNIQUE: Contiguous axial images were obtained from the base of the skull through the vertex without intravenous contrast. COMPARISON:  CT 06/12/2020 FINDINGS: Brain: Likely remote lacunar type infarct in left caudate, stable from prior. No evidence of acute infarction, hemorrhage, hydrocephalus, extra-axial collection, visible mass lesion or mass effect. Symmetric prominence of the ventricles, cisterns and sulci compatible with parenchymal volume loss. Patchy areas of white matter hypoattenuation are most compatible with chronic microvascular angiopathy. Vascular: Atherosclerotic calcification of the carotid siphons and intradural  vertebral arteries. No hyperdense vessel. Skull: No calvarial fracture or suspicious osseous lesion. No scalp swelling or hematoma. Sinuses/Orbits: Mild mural thickening in the ethmoids. Remaining paranasal sinuses are predominantly clear. Included orbital structures are unremarkable.  Other: None IMPRESSION: No acute intracranial abnormality. Background of parenchymal volume loss, microvascular angiopathy and intracranial atherosclerosis. Electronically Signed   By: Lovena Le M.D.   On: 07/22/2020 20:44    ____________________________________________   PROCEDURES  Procedure(s) performed (including Critical Care):  .1-3 Lead EKG Interpretation  Date/Time: 07/22/2020 10:35 PM Performed by: Lucrezia Starch, MD Authorized by: Lucrezia Starch, MD     Interpretation: abnormal     ECG rate assessment: bradycardic     Rhythm: sinus bradycardia     Ectopy: none     Conduction: normal     ____________________________________________   INITIAL IMPRESSION / ASSESSMENT AND PLAN / ED COURSE      Patient presents with above-stated history exam for assessment of some increased fatigue from baseline and a little moreon his feet than usual.  No other associated symptoms or recent falls or injuries.  He has been compliant with all his medications.  On arrival he is slight bradycardic with a stable vital signs on room air.  He is oriented x4 and has a nonfocal exam but at this point wide-based gait with shows leveling steps.  However he is not ataxic.  No obvious evidence of trauma on exam.  No significant asterixis or jaundice.  Differential includes possible decompensat disease, acute infectious process, arrhythmia, anemia possible spontaneous SAH or subtle CVA.  ECG is largely unremarkable aside from rate which I suspect is incidental and asymptomatic at this time.  Troponin is nonelevated 11 and overall I have a low suspicion for ACS.  TSH is WNL not consistent with myxedema coma.  Magnesium is unremarkable.  COVID and flu PCR is negative.  Urinalysis is not consistent with infection.  Ammonia is 34 CMP is no significant derangements.  AST of 55 as compared to 63 1 month ago.  T bili of 1.9 as compared to 1.4-monthago there is no evidence of elevated alk phos or ALT or  other findings to suggest acute cholestatic process at this time.  No focal deficits or finding on CT head to suggest CVA.  On trial of ambulation patient has somewhat wide-based gait but daughter states that seems to be baseline for the patient.  Overall unclear etiology for patient's increased fatigue although given stable vitals laceration exam work-up with patient at his neurological baseline per daughter at bedside I think he is stable for discharge with close outpatient follow-up.  Discharged stable condition.  Strict return precautions advised discussed.        ____________________________________________   FINAL CLINICAL IMPRESSION(S) / ED DIAGNOSES  Final diagnoses:  Fatigue, unspecified type    Medications  lactated ringers bolus 500 mL (500 mLs Intravenous New Bag/Given 07/22/20 2142)     ED Discharge Orders     None        Note:  This document was prepared using Dragon voice recognition software and may include unintentional dictation errors.    SLucrezia Starch MD 07/22/20 2236

## 2020-07-22 NOTE — ED Notes (Signed)
Patient reports sleeping all day today and not having any energy.  Reports ammonia levels have been elevated in the past causing lethargy and altered mental status.  Family reports no unusual activity, patient denies alcohol intake.  Patient alert and orient and able to answer all questions.

## 2020-07-22 NOTE — ED Provider Notes (Signed)
Emergency Medicine Provider Triage Evaluation Note  Jeff Ward , a 70 y.o. male  was evaluated in triage.  Pt here via EMS from home, think he was sent for high ammonia level but isn't sure when it was checked or where. He is oriented to self and time, but not to place. Poor historian overall but denies any pain  Review of Systems  Positive: Question high ammonia Negative: Chest pain, abdominal pain, nausea or vomiting  Physical Exam  BP 114/62 (BP Location: Right Arm)   Pulse (!) 59   Temp 98.2 F (36.8 C) (Oral)   Resp 14   Ht 5\' 5"  (1.651 m)   Wt 72.6 kg   SpO2 100%   BMI 26.63 kg/m  Gen:   Awake, no distress   Resp:  Normal effort  MSK:   Moves extremities without difficulty  Other:    Medical Decision Making  Medically screening exam initiated at 6:07 PM.  Appropriate orders placed.  Jeff Ward was informed that the remainder of the evaluation will be completed by another provider, this initial triage assessment does not replace that evaluation, and the importance of remaining in the ED until their evaluation is complete.  Review of pt records reveals recent discharge from our facility as well as hx of alcoholic cirrhosis of liver. Will initiate labs, including ammonia level.    Marlana Salvage, PA 07/22/20 1809    Lucrezia Starch, MD 07/22/20 [phone]

## 2020-07-22 NOTE — ED Notes (Signed)
Signature pad not working, patient and family verbalized understanding.

## 2020-07-22 NOTE — ED Triage Notes (Signed)
Pt arrives to ER c/o "high ammonia levels" pt states he doesn't know what it was but that it was checked today. Pt unsure where it was checked. C/o dizziness. Pt knows he's at the hospital but not which one he is at. Alert to name, year. Appears mostly oriented.

## 2020-07-22 NOTE — ED Triage Notes (Signed)
Pt in via EMS from home with c/o weakness and lethargy. Pt with hx of elevated ammonia levels. 128/66, HR 62, 99.1 temp oral, FSBS 101, #18g to left [facility]

## 2020-07-24 DIAGNOSIS — I8510 Secondary esophageal varices without bleeding: Secondary | ICD-10-CM | POA: Diagnosis not present

## 2020-07-24 DIAGNOSIS — D472 Monoclonal gammopathy: Secondary | ICD-10-CM | POA: Diagnosis not present

## 2020-07-24 DIAGNOSIS — R739 Hyperglycemia, unspecified: Secondary | ICD-10-CM | POA: Diagnosis not present

## 2020-07-24 DIAGNOSIS — I129 Hypertensive chronic kidney disease with stage 1 through stage 4 chronic kidney disease, or unspecified chronic kidney disease: Secondary | ICD-10-CM | POA: Diagnosis not present

## 2020-07-24 DIAGNOSIS — D696 Thrombocytopenia, unspecified: Secondary | ICD-10-CM | POA: Diagnosis not present

## 2020-07-24 DIAGNOSIS — K766 Portal hypertension: Secondary | ICD-10-CM | POA: Diagnosis not present

## 2020-07-24 DIAGNOSIS — N183 Chronic kidney disease, stage 3 unspecified: Secondary | ICD-10-CM | POA: Diagnosis not present

## 2020-07-24 DIAGNOSIS — R69 Illness, unspecified: Secondary | ICD-10-CM | POA: Diagnosis not present

## 2020-07-27 DIAGNOSIS — N189 Chronic kidney disease, unspecified: Secondary | ICD-10-CM | POA: Diagnosis not present

## 2020-07-27 DIAGNOSIS — Z87891 Personal history of nicotine dependence: Secondary | ICD-10-CM | POA: Diagnosis not present

## 2020-07-27 DIAGNOSIS — M5136 Other intervertebral disc degeneration, lumbar region: Secondary | ICD-10-CM | POA: Diagnosis not present

## 2020-07-27 DIAGNOSIS — I129 Hypertensive chronic kidney disease with stage 1 through stage 4 chronic kidney disease, or unspecified chronic kidney disease: Secondary | ICD-10-CM | POA: Diagnosis not present

## 2020-07-27 DIAGNOSIS — J45909 Unspecified asthma, uncomplicated: Secondary | ICD-10-CM | POA: Diagnosis not present

## 2020-07-27 DIAGNOSIS — Z9181 History of falling: Secondary | ICD-10-CM | POA: Diagnosis not present

## 2020-07-27 DIAGNOSIS — R69 Illness, unspecified: Secondary | ICD-10-CM | POA: Diagnosis not present

## 2020-07-31 ENCOUNTER — Other Ambulatory Visit: Payer: Self-pay

## 2020-07-31 ENCOUNTER — Other Ambulatory Visit: Payer: Medicare HMO | Admitting: Primary Care

## 2020-07-31 DIAGNOSIS — Z87891 Personal history of nicotine dependence: Secondary | ICD-10-CM | POA: Diagnosis not present

## 2020-07-31 DIAGNOSIS — K7682 Hepatic encephalopathy: Secondary | ICD-10-CM

## 2020-07-31 DIAGNOSIS — R69 Illness, unspecified: Secondary | ICD-10-CM | POA: Diagnosis not present

## 2020-07-31 DIAGNOSIS — J45909 Unspecified asthma, uncomplicated: Secondary | ICD-10-CM | POA: Diagnosis not present

## 2020-07-31 DIAGNOSIS — Z515 Encounter for palliative care: Secondary | ICD-10-CM | POA: Diagnosis not present

## 2020-07-31 DIAGNOSIS — N189 Chronic kidney disease, unspecified: Secondary | ICD-10-CM | POA: Diagnosis not present

## 2020-07-31 DIAGNOSIS — K7290 Hepatic failure, unspecified without coma: Secondary | ICD-10-CM | POA: Diagnosis not present

## 2020-07-31 DIAGNOSIS — Z9181 History of falling: Secondary | ICD-10-CM | POA: Diagnosis not present

## 2020-07-31 DIAGNOSIS — R5381 Other malaise: Secondary | ICD-10-CM

## 2020-07-31 DIAGNOSIS — M5136 Other intervertebral disc degeneration, lumbar region: Secondary | ICD-10-CM | POA: Diagnosis not present

## 2020-07-31 DIAGNOSIS — D649 Anemia, unspecified: Secondary | ICD-10-CM | POA: Diagnosis not present

## 2020-07-31 DIAGNOSIS — I129 Hypertensive chronic kidney disease with stage 1 through stage 4 chronic kidney disease, or unspecified chronic kidney disease: Secondary | ICD-10-CM | POA: Diagnosis not present

## 2020-07-31 DIAGNOSIS — K7030 Alcoholic cirrhosis of liver without ascites: Secondary | ICD-10-CM

## 2020-07-31 NOTE — Progress Notes (Signed)
[facility] Palliative Care Consult Note Telephone: [phone]  Fax: [phone]    Date of encounter: 07/31/20 PATIENT NAME: Jeff Ward [address] [place] [address]   [phone] (home)  DOB: 1950-05-05 MRN: [id number] PRIMARY CARE PROVIDER:    Adin Hector, MD,  [address] [place] [address] [phone]  REFERRING PROVIDER:   Adin Hector, MD [place],  [place] [address] [phone]  RESPONSIBLE PARTY:    Contact Information     Name Relation Home Work [place] Sister   [phone]   Fernande Bras   [phone]   Larue, Lightner Daughter [phone]     Yancey, Pedley   [phone]       I met face to face with patient and family in  home. Palliative Care was asked to follow this patient by consultation request of  Adin Hector, MD to address advance care planning and complex medical decision making. This is a follow up visit.         ASSESSMENT AND PLAN / RECOMMENDATIONS:   Advance Care Planning/Goals of Care: Goals include to maximize quality of life and symptom management. Our advance care planning conversation included a discussion about:    Exploration of personal, cultural or spiritual beliefs that might influence medical decisions  Exploration of goals of care in the event of a sudden injury or illness  CODE STATUS: FULL CODE I spoke with patients aunt by her request privately. She participates in the patient team and has a power of attorney for his health care. Her questions were in concern of patient's prognosis and disease advancement. She stated that they have talked honestly and know that this is an end of life disease process,  but they don't know when that process will begin. She stated they were offered hospice several months ago with a very limited prognosis, which patient has already outlived.   We  discussed the hospice services, particularly that it is not for the very end but rather when treatment options are limited and quality of life is the goal. He is currently undergoing physical therapy and has some very good days with very good function and then other days with somnolence and inability to arouse much more than  a brief answer to a question. We talked about this disease process being difficult to predict, due to the metabolic imbalances. She stated that they are prepared for when this day comes but wants some indication of how far off that may be. We will continue to meet to assess patient and  assess current treatment. He's also currently chosen full code, full scope of treatment, although aunt states that he has stated he is not afraid to die but he does not want to be "a vegetable".  I spent 35 minutes providing this consultation. More than 50% of the time in this consultation was spent in counseling and care coordination. ---------------------------------------------------------------------------------------------------------  Symptom Management/Plan:  I met with patient and his family in their home: patient , his sister , his son and his aunt were in attendance.   Fatigue: They were particularly concerned about some recent episodes of extreme fatigue. Patient has periods where he's sleeping for 18 hours a day, very tired, able to rouse but not stay awake and sometimes not even opening his eyes to respond verbally. They state there has been some change in mental status but it seems  to resolve spontaneously and not be associated with elevated ammonia levels.   We discuss ammonia and how this elevates in the body with liver disease. There are multiple times where he has had very high ammonia including asterixis. Asterixis was noted when ammonia level was 138. It has not been this high again since beginning on lactulose and a more enforced home medication regimen.   Management of  Encephalopathy: Family endorses he is not drinking alcohol now and he is on the Rifaximin currently via some pharmaceutical  samples. Its sustainability however is questionable due to cost. I had mailed a form for a grant but they did not receive it. I have emailed this form again to his aunt. Patient states he had been on this program in the past thru [facility], but was unclear as to why it was discontinued. I asked him to reapply to see if he could get some help with the co-pays which are around $3000 a month.   Fatigue: His fatigue seems to come and go. He had an episode of vomiting and diarrhea over the past several days. This could be attributed to a gastroenteritis in the community. He is also taking much lactulose and feels that he has  diarrhea at all times. This diarrhea also has been impacting his sleep and rest. We talked about quality of life and if it would be worth it to him to titrate off the lactulose while spending the money for the Rifaximin. He and his family will consider these options.  Patient states he does not feel depressed or down. He has a very supportive family and he is grateful for that. He is recently weaning off of sertraline for reduction in polypharmacy.   ED f/u: Regarding when to go to emergency for somnolence, we discussed that the options for treatment may become more and more limited with advancing disease. However this is a  decision for his family and him to make. A recent ER trip did not reveal obvious and treatable disease process, but was reassuring to family.  Follow up Palliative Care Visit: Palliative care will continue to follow for complex medical decision making, advance care planning, and clarification of goals. Return 5-6 weeks or prn.  PPS: 40%  HOSPICE ELIGIBILITY/DIAGNOSIS: TBD  Chief Complaint: fatigue  HISTORY OF PRESENT ILLNESS:  Jeff Ward is a 70 y.o. year old male  with alcoholic cirrhosis with elevated ammonia levels, transient AMS, n/v  recent now resolved. His fatigue is intermittent and has been going on since March 2022. It is in the context of advanced liver disease and manifests with periods of exhaustion, difficulty to rouse. Family is concerned about encephalopathy when this occurs.  History obtained from review of EMR, discussion with primary team, and interview with family, facility staff/caregiver and/or Jeff Ward.  I reviewed available labs, medications, imaging, studies and related documents from the EMR.  Records reviewed and summarized above.   ROS  General: NAD EYES: denies vision changes ENMT: denies dysphagia Cardiovascular: denies chest pain, denies DOE Pulmonary: denies cough, denies increased SOB Abdomen: endorses fair appetite, endorses loose stools from lactulose protocol, endorses continence of bowel GU: denies dysuria, endorses continence of urine MSK:  endorses fatigue and  weakness,  no falls reported, endorses fear of falling Skin: denies rashes or wounds Neurological: denies pain, endorses nisomnia Psych: Endorses positive mood Heme/lymph/immuno: denies bruises, abnormal bleeding  Physical Exam: Current and past weights: 124/68 HR 63 RR 18 Constitutional: NAD General: frail appearing, thin EYES: anicteric  sclera, lids intact, no discharge  ENMT: intact hearing, oral mucous membranes moist, dentition intact CV: S1S2, RRR, no LE edema Pulmonary: LCTA, no increased work of breathing, no cough, room air Abdomen: intake 75%, normo-active BS + 4 quadrants, soft and non tender, no ascites GU: deferred MSK: mod sarcopenia, moves all extremities, ambulatory Skin: warm and dry, no rashes or wounds on visible skin Neuro:   + generalized weakness,  no cognitive impairment Psych: mild anxious affect, A and O x 3 Hem/lymph/immuno: no widespread bruising  This visit was coded based on medical decision making (MDM).  Thank you for the opportunity to participate in the care of Jeff Ward.  The  palliative care team will continue to follow. Please call our office at [phone] if we can be of additional assistance.   Jason Coop, NP   COVID-19 PATIENT SCREENING TOOL Asked and negative response unless otherwise noted:   Have you had symptoms of covid, tested positive or been in contact with someone with symptoms/positive test in the past 5-10 days?

## 2020-08-08 DIAGNOSIS — E785 Hyperlipidemia, unspecified: Secondary | ICD-10-CM | POA: Diagnosis not present

## 2020-08-08 DIAGNOSIS — D472 Monoclonal gammopathy: Secondary | ICD-10-CM | POA: Diagnosis not present

## 2020-08-08 DIAGNOSIS — R739 Hyperglycemia, unspecified: Secondary | ICD-10-CM | POA: Diagnosis not present

## 2020-08-08 DIAGNOSIS — D696 Thrombocytopenia, unspecified: Secondary | ICD-10-CM | POA: Diagnosis not present

## 2020-08-08 DIAGNOSIS — R69 Illness, unspecified: Secondary | ICD-10-CM | POA: Diagnosis not present

## 2020-08-08 DIAGNOSIS — K766 Portal hypertension: Secondary | ICD-10-CM | POA: Diagnosis not present

## 2020-08-08 DIAGNOSIS — I129 Hypertensive chronic kidney disease with stage 1 through stage 4 chronic kidney disease, or unspecified chronic kidney disease: Secondary | ICD-10-CM | POA: Diagnosis not present

## 2020-08-08 DIAGNOSIS — I8510 Secondary esophageal varices without bleeding: Secondary | ICD-10-CM | POA: Diagnosis not present

## 2020-08-08 DIAGNOSIS — N183 Chronic kidney disease, stage 3 unspecified: Secondary | ICD-10-CM | POA: Diagnosis not present

## 2020-08-13 ENCOUNTER — Telehealth: Payer: Self-pay | Admitting: Gastroenterology

## 2020-08-13 DIAGNOSIS — M5136 Other intervertebral disc degeneration, lumbar region: Secondary | ICD-10-CM | POA: Diagnosis not present

## 2020-08-13 DIAGNOSIS — N189 Chronic kidney disease, unspecified: Secondary | ICD-10-CM | POA: Diagnosis not present

## 2020-08-13 DIAGNOSIS — Z87891 Personal history of nicotine dependence: Secondary | ICD-10-CM | POA: Diagnosis not present

## 2020-08-13 DIAGNOSIS — I129 Hypertensive chronic kidney disease with stage 1 through stage 4 chronic kidney disease, or unspecified chronic kidney disease: Secondary | ICD-10-CM | POA: Diagnosis not present

## 2020-08-13 DIAGNOSIS — R69 Illness, unspecified: Secondary | ICD-10-CM | POA: Diagnosis not present

## 2020-08-13 DIAGNOSIS — Z9181 History of falling: Secondary | ICD-10-CM | POA: Diagnosis not present

## 2020-08-13 DIAGNOSIS — J45909 Unspecified asthma, uncomplicated: Secondary | ICD-10-CM | POA: Diagnosis not present

## 2020-08-13 NOTE — Telephone Encounter (Signed)
Patient sister is calling because they state that the patient portion of Xifaxan 550mg  to take 1 tablet twice a day is 13000 dollars. She states that he can not afford this medication. They want to know if there is any alternative for the medication. They also want to know if he still needs to be doing lactulose 10mg  twice a day

## 2020-08-13 NOTE — Telephone Encounter (Signed)
Emailed Alex our Xifaxan rep to see if they had any patient assistance for the patient

## 2020-08-13 NOTE — Telephone Encounter (Signed)
Jeff Ward called for patient and wants to know if he can have some more samples of Xifaxan 550mg . Clinical Staff will follow up with patient.

## 2020-08-13 NOTE — Telephone Encounter (Signed)
I recommend that he continues to take lactulose 10 mg twice daily.  Unfortunately, there is no alternative medication to rifaximin  RV

## 2020-08-14 NOTE — Telephone Encounter (Signed)
We do not have enough samples to get him through a day or so of the Xifaxan. There is no patient assistance for the medication or copay cards for medicare. What do you recommend for the medication because they state they can not afford the copay

## 2020-08-14 NOTE — Telephone Encounter (Signed)
Patient sister states that they will just do the lactulose then and it seems to be keeping is ammonia levels down and keeping him out of the hospital.

## 2020-08-14 NOTE — Telephone Encounter (Signed)
Unfortunately, there is no other alternative antibiotic we could try other than continuing lactulose  RV

## 2020-08-15 DIAGNOSIS — H6123 Impacted cerumen, bilateral: Secondary | ICD-10-CM | POA: Diagnosis not present

## 2020-08-15 DIAGNOSIS — H6063 Unspecified chronic otitis externa, bilateral: Secondary | ICD-10-CM | POA: Diagnosis not present

## 2020-08-16 DIAGNOSIS — Z9181 History of falling: Secondary | ICD-10-CM | POA: Diagnosis not present

## 2020-08-16 DIAGNOSIS — Z87891 Personal history of nicotine dependence: Secondary | ICD-10-CM | POA: Diagnosis not present

## 2020-08-16 DIAGNOSIS — R69 Illness, unspecified: Secondary | ICD-10-CM | POA: Diagnosis not present

## 2020-08-16 DIAGNOSIS — J45909 Unspecified asthma, uncomplicated: Secondary | ICD-10-CM | POA: Diagnosis not present

## 2020-08-16 DIAGNOSIS — M5136 Other intervertebral disc degeneration, lumbar region: Secondary | ICD-10-CM | POA: Diagnosis not present

## 2020-08-16 DIAGNOSIS — I129 Hypertensive chronic kidney disease with stage 1 through stage 4 chronic kidney disease, or unspecified chronic kidney disease: Secondary | ICD-10-CM | POA: Diagnosis not present

## 2020-08-16 DIAGNOSIS — N189 Chronic kidney disease, unspecified: Secondary | ICD-10-CM | POA: Diagnosis not present

## 2020-08-20 ENCOUNTER — Emergency Department
Admission: EM | Admit: 2020-08-20 | Discharge: 2020-08-20 | Disposition: A | Discharge status: Home / Self Care | Payer: Medicare HMO | Attending: Emergency Medicine | Admitting: Emergency Medicine

## 2020-08-20 ENCOUNTER — Emergency Department: Payer: Medicare HMO

## 2020-08-20 ENCOUNTER — Other Ambulatory Visit: Payer: Self-pay

## 2020-08-20 ENCOUNTER — Encounter: Payer: Self-pay | Admitting: Emergency Medicine

## 2020-08-20 DIAGNOSIS — Z87891 Personal history of nicotine dependence: Secondary | ICD-10-CM | POA: Insufficient documentation

## 2020-08-20 DIAGNOSIS — S20312A Abrasion of left front wall of thorax, initial encounter: Secondary | ICD-10-CM | POA: Diagnosis not present

## 2020-08-20 DIAGNOSIS — S0101XA Laceration without foreign body of scalp, initial encounter: Secondary | ICD-10-CM | POA: Diagnosis not present

## 2020-08-20 DIAGNOSIS — W06XXXA Fall from bed, initial encounter: Secondary | ICD-10-CM | POA: Diagnosis not present

## 2020-08-20 DIAGNOSIS — Z743 Need for continuous supervision: Secondary | ICD-10-CM | POA: Diagnosis not present

## 2020-08-20 DIAGNOSIS — N189 Chronic kidney disease, unspecified: Secondary | ICD-10-CM | POA: Diagnosis not present

## 2020-08-20 DIAGNOSIS — S0990XA Unspecified injury of head, initial encounter: Secondary | ICD-10-CM | POA: Diagnosis not present

## 2020-08-20 DIAGNOSIS — Z79899 Other long term (current) drug therapy: Secondary | ICD-10-CM | POA: Diagnosis not present

## 2020-08-20 DIAGNOSIS — I129 Hypertensive chronic kidney disease with stage 1 through stage 4 chronic kidney disease, or unspecified chronic kidney disease: Secondary | ICD-10-CM | POA: Insufficient documentation

## 2020-08-20 DIAGNOSIS — R001 Bradycardia, unspecified: Secondary | ICD-10-CM | POA: Diagnosis not present

## 2020-08-20 DIAGNOSIS — J45909 Unspecified asthma, uncomplicated: Secondary | ICD-10-CM | POA: Diagnosis not present

## 2020-08-20 DIAGNOSIS — I959 Hypotension, unspecified: Secondary | ICD-10-CM | POA: Diagnosis not present

## 2020-08-20 DIAGNOSIS — W19XXXA Unspecified fall, initial encounter: Secondary | ICD-10-CM | POA: Diagnosis not present

## 2020-08-20 DIAGNOSIS — Y92009 Unspecified place in unspecified non-institutional (private) residence as the place of occurrence of the external cause: Secondary | ICD-10-CM | POA: Insufficient documentation

## 2020-08-20 DIAGNOSIS — S0093XA Contusion of unspecified part of head, initial encounter: Secondary | ICD-10-CM | POA: Diagnosis not present

## 2020-08-20 DIAGNOSIS — Z955 Presence of coronary angioplasty implant and graft: Secondary | ICD-10-CM | POA: Insufficient documentation

## 2020-08-20 LAB — BASIC METABOLIC PANEL
Anion gap: 8 (ref 5–15)
BUN: 27 mg/dL — ABNORMAL HIGH (ref 8–23)
CO2: 22 mmol/L (ref 22–32)
Calcium: 8.9 mg/dL (ref 8.9–10.3)
Chloride: 99 mmol/L (ref 98–111)
Creatinine, Ser: 1.43 mg/dL — ABNORMAL HIGH (ref 0.61–1.24)
GFR, Estimated: 52 mL/min — ABNORMAL LOW (ref >60)
Glucose, Bld: 103 mg/dL — ABNORMAL HIGH (ref 70–99)
Potassium: 4.5 mmol/L (ref 3.5–5.1)
Sodium: 129 mmol/L — ABNORMAL LOW (ref 135–145)

## 2020-08-20 LAB — CBC
HCT: 27.4 % — ABNORMAL LOW (ref 39.0–52.0)
Hemoglobin: 9.3 g/dL — ABNORMAL LOW (ref 13.0–17.0)
MCH: 29.8 pg (ref 26.0–34.0)
MCHC: 33.9 g/dL (ref 30.0–36.0)
MCV: 87.8 fL (ref 80.0–100.0)
Platelets: 90 10*3/uL — ABNORMAL LOW (ref 150–400)
RBC: 3.12 MIL/uL — ABNORMAL LOW (ref 4.22–5.81)
RDW: 15.9 % — ABNORMAL HIGH (ref 11.5–15.5)
WBC: 5 10*3/uL (ref 4.0–10.5)
nRBC: 0 % (ref 0.0–0.2)

## 2020-08-20 LAB — URINALYSIS, COMPLETE (UACMP) WITH MICROSCOPIC
Bilirubin Urine: NEGATIVE
Glucose, UA: NEGATIVE mg/dL
Hgb urine dipstick: NEGATIVE
Ketones, ur: NEGATIVE mg/dL
Leukocytes,Ua: NEGATIVE
Nitrite: NEGATIVE
Protein, ur: NEGATIVE mg/dL
Specific Gravity, Urine: 1.014 (ref 1.005–1.030)
Squamous Epithelial / HPF: NONE SEEN (ref 0–5)
pH: 5 (ref 5.0–8.0)

## 2020-08-20 NOTE — ED Notes (Signed)
Patient is back from CT, RN assisted patient to stand and obtain urine sample using urinal.  Patient was able to stand unassisted and able to stand in place with only RN standby, RN did assist with urinal.  Patient tolerated well, back to bed that is low and locked, call bell in reach.

## 2020-08-20 NOTE — ED Triage Notes (Signed)
Pt comes into the ED via [facility] from home c/o fall and laceration to the posterior side of the head.  Pt states he is unsure if he had any LOC.  Pt remembers tripping getting to the light switch but is unsure what happened after that.  Pt denies any blood thinner use.

## 2020-08-20 NOTE — ED Triage Notes (Signed)
Pt comes into the ED via EMS from home, pt fell  getting out of bed hitting the back of his head, pt has a lac to the back of head with controlled bleeding. No blood thinners, hx of cirrhosis. Pt is a/ox3    130/68

## 2020-08-20 NOTE — ED Provider Notes (Signed)
[facility] Emergency Department Provider Note   ____________________________________________    I have reviewed the triage vital signs and the nursing notes.   HISTORY  Chief Complaint Fall     HPI Jeff Ward is a 70 y.o. male with past medical history detailed below that includes significant cirrhosis here with caregiver.  She reports consistent decline over the last 5 months with worsening ambulation.  He is not significantly off his baseline today.  Had mechanical fall today, suffered posterior scalp injury.  Patient denies any complaints  Past Medical History:  Diagnosis Date   Allergy    SEASONAL   Anemia    Anxiety    Arthritis    In back and all joints   Asthma    Chronic headache    now resolved   CKD (chronic kidney disease)    Colon polyps 08/19/2006   diverticulum on colonoscopy   Depression    Esophageal varices (HCC)    GERD (gastroesophageal reflux disease)    GI bleed    Hiatal hernia    History of kidney stones 1970's   Hyperlipidemia 06/2002   Hypertension    Liver disease    Psoriasis    Stroke ([place])    PT. STATED MILD   Substance abuse ([place])    H/O ETOH    Patient Active Problem List   Diagnosis Date Noted   History of esophageal varices    GAVE (gastric antral vascular ectasia)    Dehydration 06/12/2020   Hepatic encephalopathy ([place]) 04/13/2020   Encephalopathy acute 04/13/2020   Portal hypertension ([place]) 04/13/2020   Physical deconditioning 04/13/2020   NSTEMI (non-ST elevated myocardial infarction) ([place]) 04/01/2020   Hypokalemia 02/29/2020   Hypotension 02/29/2020   Prolonged QT interval 02/29/2020   Symptomatic anemia 02/29/2020   Occult blood in stools 02/29/2020   Thrombocytopenia ([place]) 02/29/2020   MGUS (monoclonal gammopathy of unknown significance) 06/18/2016   SI (sacroiliac) joint inflammation ([place]) 03/31/2016   Encephalopathy, hepatic ([place]) 03/05/2016   Esophageal varices without  bleeding ([place]) 03/05/2016   Right foot pain 01/31/2016   Hyperkalemia 01/31/2016   Generalized osteoarthritis of hand [id number]   Alcoholic cirrhosis of liver with ascites ([place]) 08/06/2015   Secondary esophageal varices with bleeding ([place])    Alcoholic cirrhosis ([place]) [id number]   Decompensated hepatic cirrhosis ([place]) 06/19/2015   GERD (gastroesophageal reflux disease) 03/22/2015   Personal history of colonic polyps 04/27/2013   Depression 02/17/2013   Hyperglycemia 07/23/2011   OSA (obstructive sleep apnea) 04/10/2011   Testosterone deficiency 03/06/2011   Rectal bleeding 01/09/2011   Degenerative disc disease, lumbar 05/09/2010   OTHER NONSPECIFIC ABNORMAL SERUM ENZYME LEVELS 05/23/2009   Essential hypertension, benign 09/01/2007   COLONIC POLYPS 10/23/2006   HLD (hyperlipidemia) 10/23/2006   PUD 10/23/2006   Arthropathy, multiple sites 10/23/2006   CARPAL TUNNEL SYNDROME, BILATERAL, HX OF 10/23/2006   BENIGN PROSTATIC HYPERTROPHY, HX OF 10/23/2006    Past Surgical History:  Procedure Laterality Date   CARPAL TUNNEL RELEASE  2005    / right hand   COLONOSCOPY     march 2016   ESOPHAGEAL BANDING  09/22/2017   Procedure: ESOPHAGEAL BANDING;  Surgeon: Ladene Artist, MD;  Location: [facility];  Service: Endoscopy;;   ESOPHAGEAL BANDING  10/11/2018   Procedure: ESOPHAGEAL BANDING;  Surgeon: Ladene Artist, MD;  Location: [facility]COPY;  Service: Endoscopy;;   ESOPHAGOGASTRODUODENOSCOPY N/A 07/24/2015   Procedure: ESOPHAGOGASTRODUODENOSCOPY (EGD);  Surgeon: Lucilla Lame, MD;  Location:  [facility];  Service: Endoscopy;  Laterality: N/A;  Banding   ESOPHAGOGASTRODUODENOSCOPY N/A 07/18/2020   Procedure: ESOPHAGOGASTRODUODENOSCOPY (EGD);  Surgeon: Lin Landsman, MD;  Location: [facility] ENDOSCOPY;  Service: Gastroenterology;  Laterality: N/A;   ESOPHAGOGASTRODUODENOSCOPY (EGD) WITH PROPOFOL N/A 08/14/2015   Procedure: ESOPHAGOGASTRODUODENOSCOPY (EGD) WITH PROPOFOL;  Surgeon:  Ladene Artist, MD;  Location: [facility] ENDOSCOPY;  Service: Endoscopy;  Laterality: N/A;   ESOPHAGOGASTRODUODENOSCOPY (EGD) WITH PROPOFOL N/A 09/22/2017   Procedure: ESOPHAGOGASTRODUODENOSCOPY (EGD) WITH PROPOFOL;  Surgeon: Ladene Artist, MD;  Location: [facility];  Service: Endoscopy;  Laterality: N/A;   ESOPHAGOGASTRODUODENOSCOPY (EGD) WITH PROPOFOL N/A 10/11/2018   Procedure: ESOPHAGOGASTRODUODENOSCOPY (EGD) WITH PROPOFOL;  Surgeon: Ladene Artist, MD;  Location: [facility] ENDOSCOPY;  Service: Endoscopy;  Laterality: N/A;   LEFT HEART CATH N/A 04/02/2020   Procedure: Left Heart Cath and Coronary Angiography;  Surgeon: Isaias Cowman, MD;  Location: [facility];  Service: Cardiovascular;  Laterality: N/A;   TONSILLECTOMY AND ADENOIDECTOMY     70 years old   VASECTOMY      Prior to Admission medications   Medication Sig Start Date End Date Taking? Authorizing Provider  amLODipine (NORVASC) 5 MG tablet Take 5 mg by mouth daily.    [provider]  atorvastatin (LIPITOR) 40 MG tablet Take 40 mg by mouth daily. 12/28/19   [provider]  busPIRone (BUSPAR) 7.5 MG tablet Take 1 tablet (7.5 mg total) by mouth 2 (two) times daily. 06/15/20   Regalado, Belkys A, MD  EPINEPHrine 0.3 mg/0.3 mL IJ SOAJ injection Inject into the muscle.    [provider]  furosemide (LASIX) 20 MG tablet Take 3 tablets (60 mg total) by mouth daily. 04/06/20   Fritzi Mandes, MD  ketoconazole (NIZORAL) 2 % cream Apply topically. 09/23/19 09/22/20  [provider]  lactulose (CHRONULAC) 10 GM/15ML solution Take by mouth.    [provider]  pantoprazole (PROTONIX) 40 MG tablet Take 1 tablet by mouth daily. 06/26/20   [provider]  propranolol (INDERAL) 10 MG tablet Take 10 mg by mouth 2 (two) times daily. 03/04/20   [provider]  sertraline (ZOLOFT) 25 MG tablet Take 25 mg by mouth daily. 02/23/20   [provider]  spironolactone (ALDACTONE) 50 MG  tablet Take 3 tablets (150 mg total) by mouth daily. 04/06/20   Fritzi Mandes, MD  triamcinolone cream (KENALOG) 0.1 % Apply topically. 06/27/20 06/27/21  [provider]  VENTOLIN HFA 108 (90 Base) MCG/ACT inhaler Inhale 2 puffs into the lungs every 6 (six) hours as needed for wheezing or shortness of breath.  07/28/16   [provider]     Allergies Bee venom, Sulfa antibiotics, and Duloxetine  Family History  Problem Relation Age of Onset   CAD Mother    CVA Mother    Heart disease Mother    Melanoma Father    CVA Father    Liver disease Brother    Breast cancer Paternal Aunt    Alcohol abuse Maternal Grandfather     Social History Social History   Tobacco Use   Smoking status: Former    Types: Cigars    Quit date: 04/02/2010    Years since quitting: 10.3   Smokeless tobacco: Never   Tobacco comments:    Quit 4 years ago  Vaping Use   Vaping Use: Never used  Substance Use Topics   Alcohol use: Not Currently    Alcohol/week: 2.0 standard drinks    Types: 2  Cans of beer per week    Comment: Used to be a heavy drinker, quit for 5 months now   Drug use: No    Review of Systems  Constitutional: No reports of fevers Eyes: No visual changes.  ENT: No nasal injury Cardiovascular: Denies chest wall pain Respiratory: Denies shortness of breath. Gastrointestinal:  no vomiting.   Genitourinary: No groin injury Musculoskeletal: Negative for neck pain. Skin: As above Neurological: No acute weakness   ____________________________________________   PHYSICAL EXAM:  VITAL SIGNS: ED Triage Vitals  Enc Vitals Group     BP 08/20/20 0804 (!) 112/57     Pulse Rate 08/20/20 0804 (!) 51     Resp 08/20/20 0804 (!) 100     Temp 08/20/20 0804 98.2 F (36.8 C)     Temp Source 08/20/20 0804 Oral     SpO2 08/20/20 0804 100 %     Weight 08/20/20 0805 72.5 kg (159 lb 13.3 oz)     Height 08/20/20 0805 1.651 m ('5\' 5"'$ )     Head Circumference --      Peak Flow --       Pain Score 08/20/20 0805 0     Pain Loc --      Pain Edu? --      Excl. in [place]? --     Constitutional: Alert and oriented.  Eyes: Conjunctivae are normal.  PERRLA, EOMI Head: Posterior scalp laceration, approximately 2 cm, stellate Nose: No swelling or epistaxis Mouth/Throat: Mucous membranes are moist.   Neck:  Painless ROM, no pain with axial load, no vertebral tenderness palpation Cardiovascular: Grossly normal heart sounds.  Good peripheral circulation.  No chest wall tenderness palpation Respiratory: Normal respiratory effort.  No retractions. Lungs CTAB. Gastrointestinal: Soft and nontender. No distention.  No CVA tenderness. Genitourinary: deferred Musculoskeletal: No pain with axial load on both hips.  No vertebral has palpation, normal range of motion of all extremities Neurologic:  Normal speech and language. No gross focal neurologic deficits are appreciated.  Skin:  Skin is warm, dry.  Laceration as above.  Shallow abrasion left chest wall, no bony tenderness Psychiatric: Mood and affect are normal. Speech and behavior are normal.  ____________________________________________   LABS (all labs ordered are listed, but only abnormal results are displayed)  Labs Reviewed  BASIC METABOLIC PANEL - Abnormal; Notable for the following components:      Result Value   Sodium 129 (*)    Glucose, Bld 103 (*)    BUN 27 (*)    Creatinine, Ser 1.43 (*)    GFR, Estimated 52 (*)    All other components within normal limits  CBC - Abnormal; Notable for the following components:   RBC 3.12 (*)    Hemoglobin 9.3 (*)    HCT 27.4 (*)    RDW 15.9 (*)    Platelets 90 (*)    All other components within normal limits  URINALYSIS, COMPLETE (UACMP) WITH MICROSCOPIC   ____________________________________________  EKG  ED ECG REPORT I, Lavonia Drafts, the attending physician, personally viewed and interpreted this ECG.  Date: 08/20/2020  Rhythm: normal sinus rhythm QRS Axis:  normal Intervals: normal ST/T Wave abnormalities: normal Narrative Interpretation: no evidence of acute ischemia  ____________________________________________  RADIOLOGY  CT head reviewed by me, no acute abnormality ____________________________________________   PROCEDURES  Procedure(s) performed: yes  Procedures   Critical Care performed: No ____________________________________________   INITIAL IMPRESSION / ASSESSMENT AND PLAN / ED COURSE  Pertinent labs & imaging results  that were available during my care of the patient were reviewed by me and considered in my medical decision making (see chart for details).   Patient presents with mechanical fall.  Gradual decline reported by caregiver.  No significant acute change in baseline.  Posterior laceration to the scalp repaired with staples.  CT head unremarkable, lab work unremarkable.  Recommend outpatient follow-up.    ____________________________________________   FINAL CLINICAL IMPRESSION(S) / ED DIAGNOSES  Final diagnoses:  None        Note:  This document was prepared using Dragon voice recognition software and may include unintentional dictation errors.    Lavonia Drafts, MD 08/20/20 [phone]

## 2020-08-20 NOTE — ED Notes (Signed)
Patient transported to CT 

## 2020-08-21 ENCOUNTER — Telehealth: Payer: Self-pay | Admitting: Primary Care

## 2020-08-21 NOTE — Telephone Encounter (Signed)
T/c from [facility], asking about readiness for hospice. We discussed hospice benefit and what hospice care would entail. Discussed current treatments and how they've impacted him. Discussed medications for treating liver disease. Currently has home health and PT , OT. I recommended they complete OT and we can assess his readiness for hospice. They have some insurance questions as well. Will f/u with call or telemed visit.

## 2020-08-24 DIAGNOSIS — R69 Illness, unspecified: Secondary | ICD-10-CM | POA: Diagnosis not present

## 2020-08-24 DIAGNOSIS — J45909 Unspecified asthma, uncomplicated: Secondary | ICD-10-CM | POA: Diagnosis not present

## 2020-08-24 DIAGNOSIS — I129 Hypertensive chronic kidney disease with stage 1 through stage 4 chronic kidney disease, or unspecified chronic kidney disease: Secondary | ICD-10-CM | POA: Diagnosis not present

## 2020-08-24 DIAGNOSIS — N189 Chronic kidney disease, unspecified: Secondary | ICD-10-CM | POA: Diagnosis not present

## 2020-08-24 DIAGNOSIS — M5136 Other intervertebral disc degeneration, lumbar region: Secondary | ICD-10-CM | POA: Diagnosis not present

## 2020-08-24 DIAGNOSIS — Z9181 History of falling: Secondary | ICD-10-CM | POA: Diagnosis not present

## 2020-08-24 DIAGNOSIS — Z87891 Personal history of nicotine dependence: Secondary | ICD-10-CM | POA: Diagnosis not present

## 2020-08-27 DIAGNOSIS — W19XXXS Unspecified fall, sequela: Secondary | ICD-10-CM | POA: Diagnosis not present

## 2020-08-27 DIAGNOSIS — D696 Thrombocytopenia, unspecified: Secondary | ICD-10-CM | POA: Diagnosis not present

## 2020-08-27 DIAGNOSIS — R69 Illness, unspecified: Secondary | ICD-10-CM | POA: Diagnosis not present

## 2020-08-27 DIAGNOSIS — N183 Chronic kidney disease, stage 3 unspecified: Secondary | ICD-10-CM | POA: Diagnosis not present

## 2020-08-27 DIAGNOSIS — K766 Portal hypertension: Secondary | ICD-10-CM | POA: Diagnosis not present

## 2020-08-27 DIAGNOSIS — Y92009 Unspecified place in unspecified non-institutional (private) residence as the place of occurrence of the external cause: Secondary | ICD-10-CM | POA: Diagnosis not present

## 2020-08-27 DIAGNOSIS — I129 Hypertensive chronic kidney disease with stage 1 through stage 4 chronic kidney disease, or unspecified chronic kidney disease: Secondary | ICD-10-CM | POA: Diagnosis not present

## 2020-08-29 ENCOUNTER — Telehealth: Payer: Self-pay | Admitting: Primary Care

## 2020-08-29 NOTE — Telephone Encounter (Signed)
40 minute phone call again outlining hospice program and considerations for medication reimbursement based on hospice formulary availability. Discussed end of life decision making and issues of quality of life. Discussed previous experiences with family members. Aunt and sister to continue to discuss the goals and time for hospice. Meanwhile will reach out to current home health to get needed DME. Aunt will call back RE hospice decisions. Referral on hold at [facility] pending family decisions.

## 2020-08-30 DIAGNOSIS — I129 Hypertensive chronic kidney disease with stage 1 through stage 4 chronic kidney disease, or unspecified chronic kidney disease: Secondary | ICD-10-CM | POA: Diagnosis not present

## 2020-08-30 DIAGNOSIS — J45909 Unspecified asthma, uncomplicated: Secondary | ICD-10-CM | POA: Diagnosis not present

## 2020-08-30 DIAGNOSIS — Z87891 Personal history of nicotine dependence: Secondary | ICD-10-CM | POA: Diagnosis not present

## 2020-08-30 DIAGNOSIS — R69 Illness, unspecified: Secondary | ICD-10-CM | POA: Diagnosis not present

## 2020-08-30 DIAGNOSIS — M5136 Other intervertebral disc degeneration, lumbar region: Secondary | ICD-10-CM | POA: Diagnosis not present

## 2020-08-30 DIAGNOSIS — N189 Chronic kidney disease, unspecified: Secondary | ICD-10-CM | POA: Diagnosis not present

## 2020-08-30 DIAGNOSIS — Z9181 History of falling: Secondary | ICD-10-CM | POA: Diagnosis not present

## 2020-09-03 ENCOUNTER — Telehealth: Payer: Self-pay | Admitting: Primary Care

## 2020-09-03 DIAGNOSIS — J45909 Unspecified asthma, uncomplicated: Secondary | ICD-10-CM | POA: Diagnosis not present

## 2020-09-03 DIAGNOSIS — Z87891 Personal history of nicotine dependence: Secondary | ICD-10-CM | POA: Diagnosis not present

## 2020-09-03 DIAGNOSIS — M5136 Other intervertebral disc degeneration, lumbar region: Secondary | ICD-10-CM | POA: Diagnosis not present

## 2020-09-03 DIAGNOSIS — R69 Illness, unspecified: Secondary | ICD-10-CM | POA: Diagnosis not present

## 2020-09-03 DIAGNOSIS — N189 Chronic kidney disease, unspecified: Secondary | ICD-10-CM | POA: Diagnosis not present

## 2020-09-03 DIAGNOSIS — I129 Hypertensive chronic kidney disease with stage 1 through stage 4 chronic kidney disease, or unspecified chronic kidney disease: Secondary | ICD-10-CM | POA: Diagnosis not present

## 2020-09-03 DIAGNOSIS — Z9181 History of falling: Secondary | ICD-10-CM | POA: Diagnosis not present

## 2020-09-03 NOTE — Telephone Encounter (Signed)
T/c from [place], requesting hospice consultation visit. Referral Intake of [facility] notified per family request.

## 2020-09-17 DIAGNOSIS — I129 Hypertensive chronic kidney disease with stage 1 through stage 4 chronic kidney disease, or unspecified chronic kidney disease: Secondary | ICD-10-CM | POA: Diagnosis not present

## 2020-09-17 DIAGNOSIS — R69 Illness, unspecified: Secondary | ICD-10-CM | POA: Diagnosis not present

## 2020-09-17 DIAGNOSIS — N183 Chronic kidney disease, stage 3 unspecified: Secondary | ICD-10-CM | POA: Diagnosis not present

## 2020-09-17 DIAGNOSIS — K766 Portal hypertension: Secondary | ICD-10-CM | POA: Diagnosis not present

## 2020-09-17 DIAGNOSIS — K7290 Hepatic failure, unspecified without coma: Secondary | ICD-10-CM | POA: Diagnosis not present

## 2020-09-17 DIAGNOSIS — K922 Gastrointestinal hemorrhage, unspecified: Secondary | ICD-10-CM | POA: Diagnosis not present

## 2020-09-17 DIAGNOSIS — I8500 Esophageal varices without bleeding: Secondary | ICD-10-CM | POA: Diagnosis not present

## 2020-10-16 ENCOUNTER — Ambulatory Visit: Payer: Medicare HMO | Admitting: Gastroenterology

## 2020-11-27 DEATH — deceased

## 2023-02-18 IMAGING — MR MR ABDOMEN WO/W CM
17 series · 48 of 48 positions shown · IV contrast (7.5ml Gadavist)
Comparison: CT scan 07/21/2018 and abdominal ultrasound examination
04/03/2020

CLINICAL DATA: Cirrhosis, ascites and possible portal vein
thrombosis.

EXAM:
MRI ABDOMEN WITHOUT AND WITH CONTRAST
TECHNIQUE: Multiplanar multisequence MR imaging of the abdomen was performed
both before and after the administration of intravenous contrast.
CONTRAST:  7.5mL GADAVIST GADOBUTROL 1 MMOL/ML IV SOLN

[Series 3: T2 · coronal · 6.0mm · 1.31mm/px · 1 of 37 slices shown (1 of 2)]
[im 1/37]
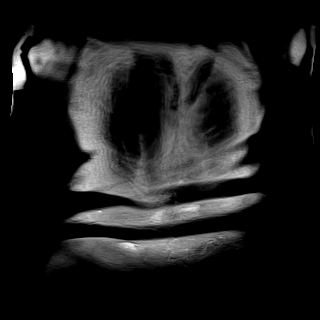

[Series 4: T2 · axial · 6.0mm · 1.25mm/px · 1 of 32 slices shown (2 of 2)]
[im 1/32]
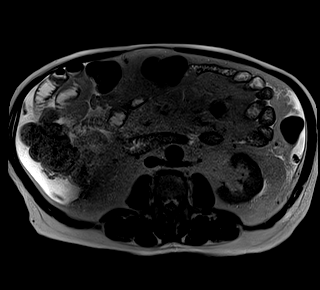

[Series 5: T1 · axial · 6.0mm · 0.78mm/px · 1 of 32 slices shown (1 of 2)]
[im 1/32]
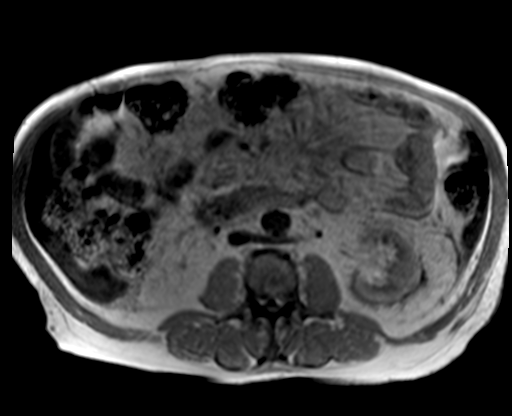

[Series 5: T1 · axial · 6.0mm · 0.78mm/px · 1 of 32 slices shown (2 of 2)]
[im 1/32]
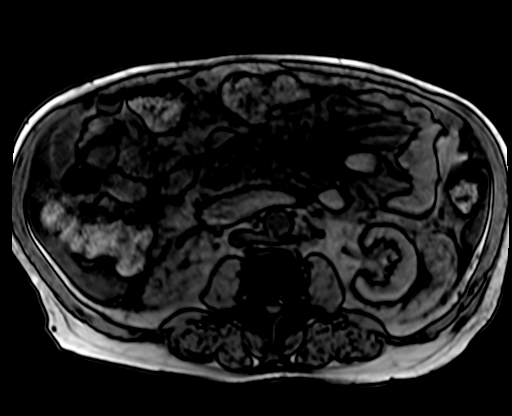

[Series 6: T2 fat-sat · axial · 6.0mm · 1.25mm/px · 1 of 32 slices shown]
[im 1/32]
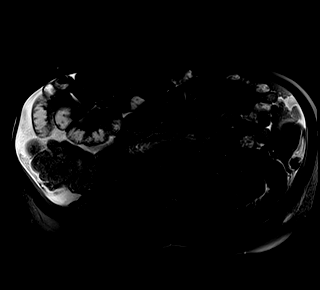

[Series 7: ax dwi_tracew · axial · 6.0mm · 1.57mm/px · z∈[-106,+103]mm · 3 of 90 slices shown]
[im 1/90]
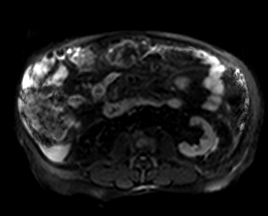
[im 45/90]
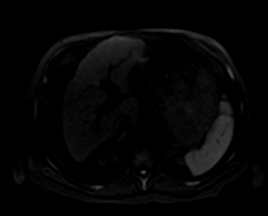
[im 90/90]
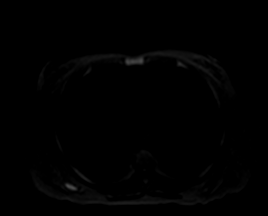

[Series 8: ax dwi_adc · axial · 6.0mm · 1.57mm/px · 1 of 30 slices shown]
[im 1/30]
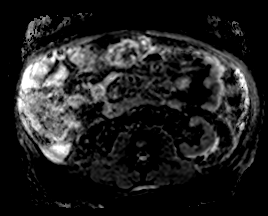

[Series 9: T1 dynamic fat-sat · axial · non-contrast · 3.0mm · 1.31mm/px · z∈[-132,+129]mm · 3 of 88 slices shown (1 of 5)]
[im 1/88]
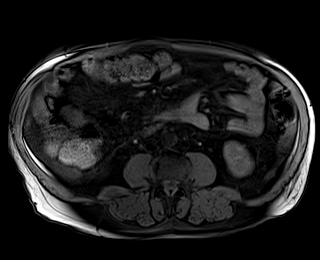
[im 44/88]
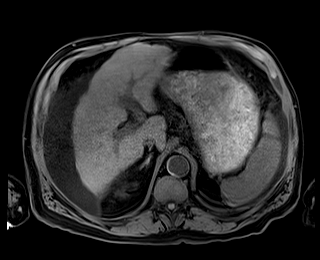
[im 88/88]
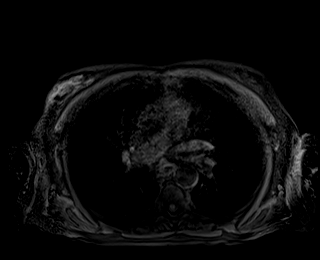

[Series 10: T1 dynamic fat-sat post-contrast · axial · 3.0mm · 1.31mm/px · z∈[-132,+129]mm · 4 of 88 slices shown (1 of 4)]
[im 1/88]
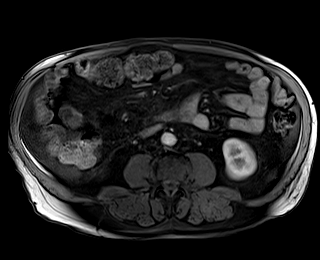
[im 30/88]
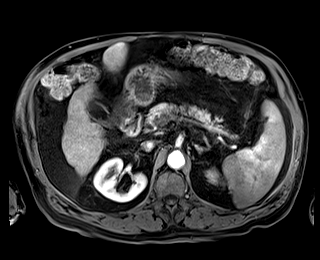
[im 59/88]
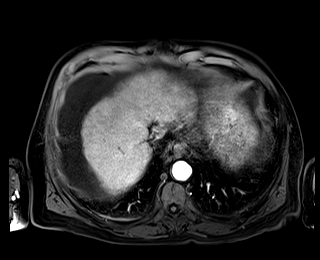
[im 88/88]
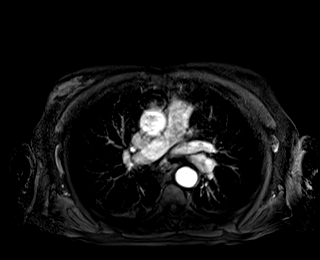

[Series 11: T1 dynamic fat-sat · axial · 3.0mm · 1.31mm/px · z∈[-132,+129]mm · 4 of 88 slices shown (2 of 5)]
[im 1/88]
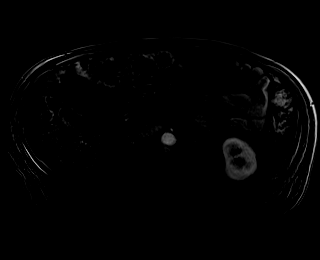
[im 30/88]
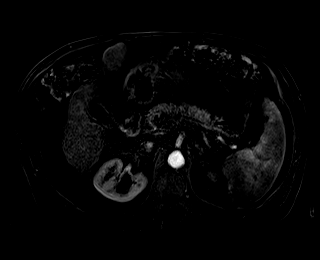
[im 59/88]
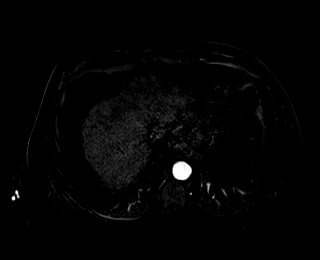
[im 88/88]
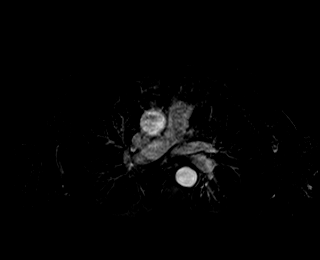

[Series 12: T1 dynamic fat-sat post-contrast · axial · 3.0mm · 1.31mm/px · z∈[-132,+129]mm · 4 of 88 slices shown (2 of 4)]
[im 1/88]
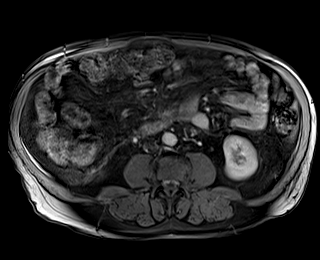
[im 30/88]
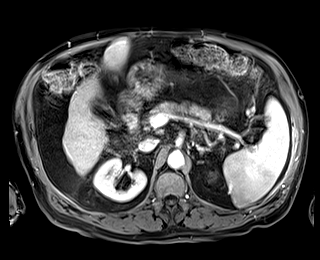
[im 59/88]
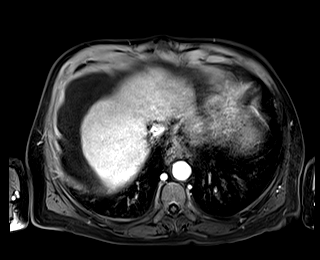
[im 88/88]
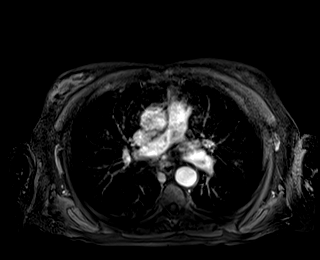

[Series 13: T1 dynamic fat-sat · axial · 3.0mm · 1.31mm/px · z∈[-132,+129]mm · 4 of 88 slices shown (3 of 5)]
[im 1/88]
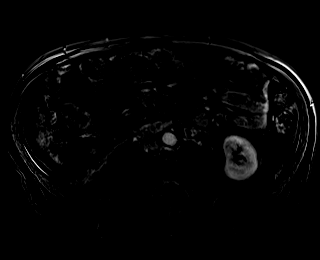
[im 30/88]
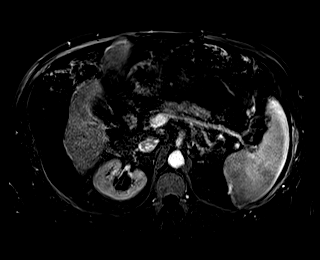
[im 59/88]
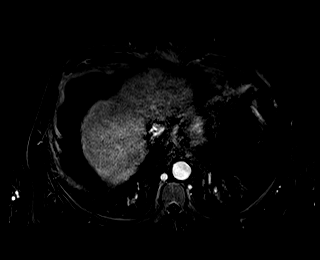
[im 88/88]
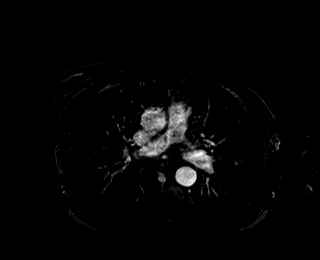

[Series 14: T1 dynamic fat-sat post-contrast · axial · 3.0mm · 1.31mm/px · z∈[-132,+129]mm · 4 of 88 slices shown (3 of 4)]
[im 1/88]
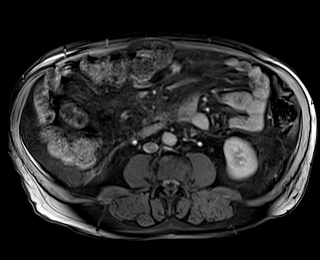
[im 30/88]
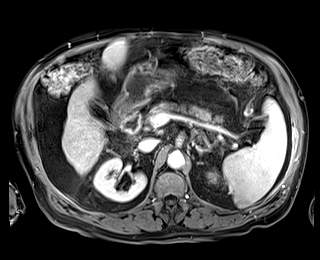
[im 59/88]
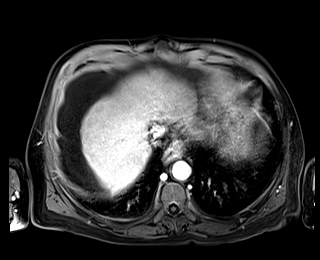
[im 88/88]
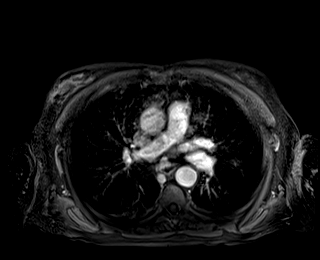

[Series 15: T1 dynamic fat-sat · axial · 3.0mm · 1.31mm/px · z∈[-132,+129]mm · 4 of 88 slices shown (4 of 5)]
[im 1/88]
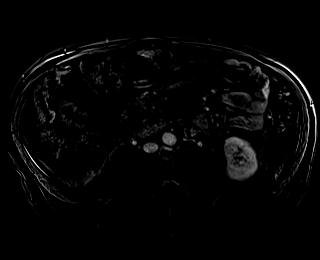
[im 30/88]
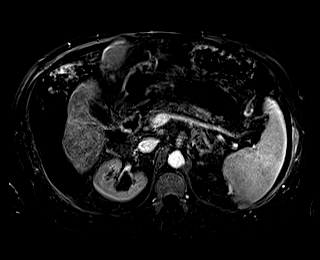
[im 59/88]
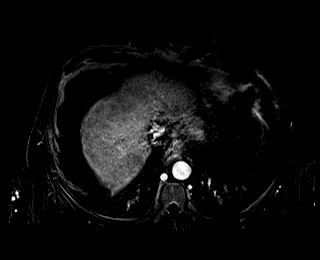
[im 88/88]
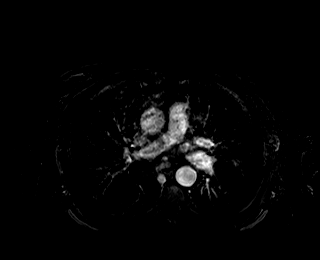

[Series 16: T1 dynamic post-contrast · coronal · 3.0mm · 1.41mm/px · 4 of 88 slices shown]
[im 1/88]
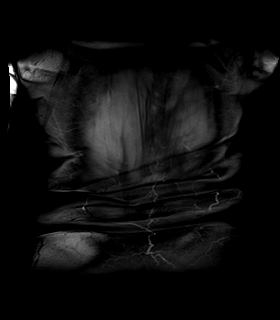
[im 30/88]
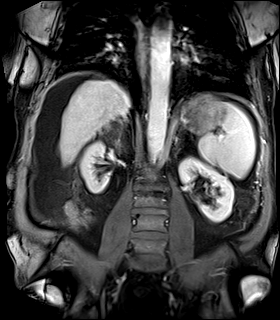
[im 59/88]
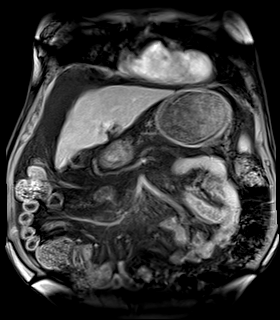
[im 88/88]
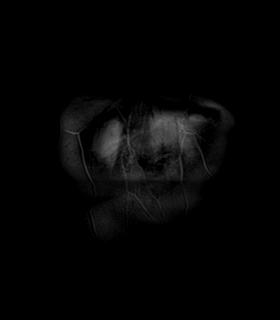

[Series 17: T1 dynamic fat-sat post-contrast · axial · 3.0mm · 1.31mm/px · z∈[-132,+129]mm · 4 of 88 slices shown (4 of 4)]
[im 1/88]
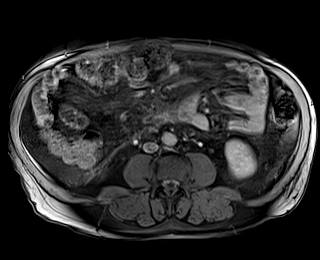
[im 30/88]
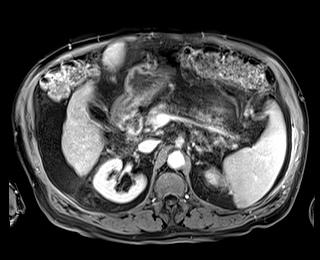
[im 59/88]
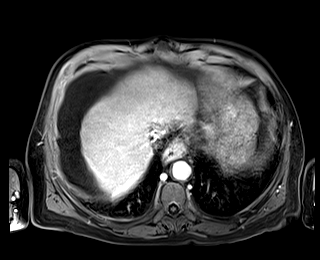
[im 88/88]
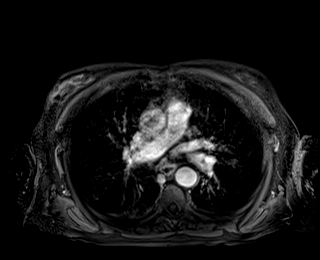

[Series 18: T1 dynamic fat-sat · axial · 3.0mm · 1.31mm/px · z∈[-132,+129]mm · 4 of 88 slices shown (5 of 5)]
[im 1/88]
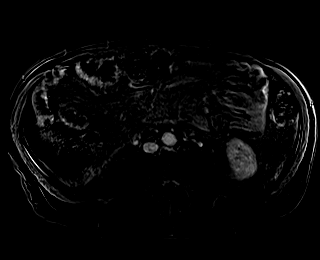
[im 30/88]
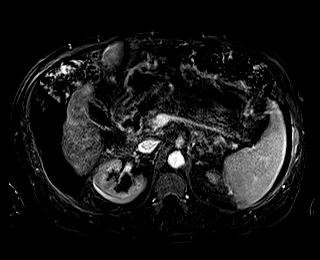
[im 59/88]
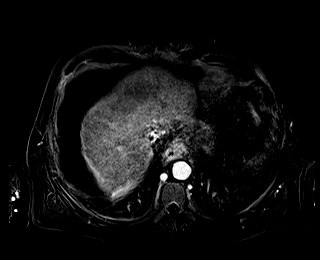
[im 88/88]
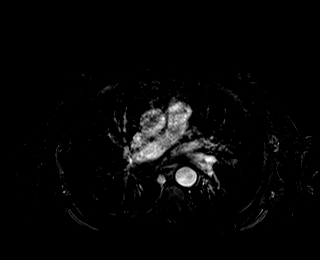

[48 of 48 positions shown; findings below may reference images not displayed]

FINDINGS: Lower chest: The lung bases are grossly clear. No worrisome
pulmonary lesions or pleural effusion. The heart is borderline
enlarged. No pericardial effusion.

Hepatobiliary: Advanced cirrhotic changes involving the liver with
portal venous hypertension and portal venous collaterals. No early
arterial phase enhancing lesions to suggest dysplastic nodules or
HCC. No intrahepatic biliary dilatation. The gallbladder
demonstrates small layering gallstones and moderate gallbladder wall
thickening likely due to the patient has cirrhosis and ascites.
Normal caliber and course of the common bile duct.

Pancreas:  No mass, inflammation or ductal dilatation.

Spleen:  Mild stable splenomegaly.  No splenic lesions.

Adrenals/Urinary Tract: Adrenal glands and kidneys are unremarkable.
Simple upper pole right renal cyst. No worrisome renal lesions or
hydronephrosis.

Stomach/Bowel: The stomach is moderately distended flu to, fluid and
air. The duodenum, visualized small bowel and visualized colon are
grossly normal.

Vascular/Lymphatic: The aorta and branch vessels are patent. The
major venous structures are patent. Specifically, I do not see any
evidence for portal, splenic or SMV thrombosis.

Other: Moderate volume abdominal ascites along with mesenteric and
omental edema.

Musculoskeletal: No significant bony findings.
IMPRESSION: 1. Advanced cirrhotic changes involving the liver with portal venous
hypertension, portal venous collaterals, moderate volume abdominal
ascites and mesenteric and omental edema.
2. No findings for dysplastic nodules or HCC.
3. No intra or extrahepatic biliary dilatation.
4. Cholelithiasis with moderate gallbladder wall thickening likely
due to the patient's cirrhosis and ascites.
5. No acute abdominal findings, mass lesions or adenopathy.
